# Patient Record
Sex: Male | Born: 1945 | Race: White | Hispanic: No | Marital: Married | Smoking: Former smoker
Health system: Southern US, Community
[De-identification: ages and names within clinical notes are randomized; demographics above are authoritative.]

## PROBLEM LIST (undated history)

## (undated) DIAGNOSIS — F329 Major depressive disorder, single episode, unspecified: Secondary | ICD-10-CM

## (undated) DIAGNOSIS — K922 Gastrointestinal hemorrhage, unspecified: Secondary | ICD-10-CM

## (undated) DIAGNOSIS — H18519 Endothelial corneal dystrophy, unspecified eye: Secondary | ICD-10-CM

## (undated) DIAGNOSIS — I340 Nonrheumatic mitral (valve) insufficiency: Secondary | ICD-10-CM

## (undated) DIAGNOSIS — I639 Cerebral infarction, unspecified: Secondary | ICD-10-CM

## (undated) DIAGNOSIS — R7301 Impaired fasting glucose: Secondary | ICD-10-CM

## (undated) DIAGNOSIS — G43719 Chronic migraine without aura, intractable, without status migrainosus: Secondary | ICD-10-CM

## (undated) DIAGNOSIS — D126 Benign neoplasm of colon, unspecified: Secondary | ICD-10-CM

## (undated) DIAGNOSIS — H1851 Endothelial corneal dystrophy: Secondary | ICD-10-CM

## (undated) DIAGNOSIS — M47812 Spondylosis without myelopathy or radiculopathy, cervical region: Secondary | ICD-10-CM

## (undated) DIAGNOSIS — I517 Cardiomegaly: Secondary | ICD-10-CM

## (undated) DIAGNOSIS — F419 Anxiety disorder, unspecified: Secondary | ICD-10-CM

## (undated) DIAGNOSIS — G894 Chronic pain syndrome: Secondary | ICD-10-CM

## (undated) DIAGNOSIS — Z87442 Personal history of urinary calculi: Secondary | ICD-10-CM

## (undated) DIAGNOSIS — M5030 Other cervical disc degeneration, unspecified cervical region: Secondary | ICD-10-CM

## (undated) DIAGNOSIS — R351 Nocturia: Secondary | ICD-10-CM

## (undated) DIAGNOSIS — F32A Depression, unspecified: Secondary | ICD-10-CM

## (undated) DIAGNOSIS — M542 Cervicalgia: Secondary | ICD-10-CM

## (undated) DIAGNOSIS — H269 Unspecified cataract: Secondary | ICD-10-CM

## (undated) DIAGNOSIS — G3184 Mild cognitive impairment, so stated: Secondary | ICD-10-CM

## (undated) DIAGNOSIS — M792 Neuralgia and neuritis, unspecified: Secondary | ICD-10-CM

## (undated) DIAGNOSIS — T753XXA Motion sickness, initial encounter: Secondary | ICD-10-CM

## (undated) DIAGNOSIS — I10 Essential (primary) hypertension: Secondary | ICD-10-CM

## (undated) DIAGNOSIS — F1020 Alcohol dependence, uncomplicated: Secondary | ICD-10-CM

## (undated) DIAGNOSIS — M5481 Occipital neuralgia: Secondary | ICD-10-CM

## (undated) DIAGNOSIS — E7800 Pure hypercholesterolemia, unspecified: Secondary | ICD-10-CM

## (undated) DIAGNOSIS — I071 Rheumatic tricuspid insufficiency: Secondary | ICD-10-CM

## (undated) DIAGNOSIS — M5410 Radiculopathy, site unspecified: Secondary | ICD-10-CM

## (undated) DIAGNOSIS — M1990 Unspecified osteoarthritis, unspecified site: Secondary | ICD-10-CM

## (undated) HISTORY — DX: Essential (primary) hypertension: I10

## (undated) HISTORY — DX: Impaired fasting glucose: R73.01

## (undated) HISTORY — PX: EYE SURGERY: SHX253

## (undated) HISTORY — DX: Unspecified osteoarthritis, unspecified site: M19.90

## (undated) HISTORY — DX: Alcohol dependence, uncomplicated: F10.20

---

## 2004-07-06 ENCOUNTER — Ambulatory Visit: Payer: Self-pay | Admitting: Internal Medicine

## 2004-11-06 ENCOUNTER — Ambulatory Visit: Payer: Self-pay | Admitting: Internal Medicine

## 2004-11-22 ENCOUNTER — Ambulatory Visit: Payer: Self-pay | Admitting: Internal Medicine

## 2004-12-07 ENCOUNTER — Ambulatory Visit: Payer: Self-pay

## 2004-12-12 ENCOUNTER — Ambulatory Visit: Payer: Self-pay | Admitting: Internal Medicine

## 2005-01-21 ENCOUNTER — Ambulatory Visit: Payer: Self-pay | Admitting: Internal Medicine

## 2005-01-24 ENCOUNTER — Ambulatory Visit: Payer: Self-pay | Admitting: Internal Medicine

## 2005-03-07 ENCOUNTER — Ambulatory Visit: Payer: Self-pay | Admitting: Internal Medicine

## 2005-06-21 ENCOUNTER — Ambulatory Visit: Payer: Self-pay | Admitting: Internal Medicine

## 2005-06-21 ENCOUNTER — Encounter: Payer: Self-pay | Admitting: Internal Medicine

## 2006-01-14 ENCOUNTER — Ambulatory Visit: Payer: Self-pay | Admitting: Internal Medicine

## 2006-01-20 ENCOUNTER — Ambulatory Visit: Payer: Self-pay | Admitting: Internal Medicine

## 2006-10-20 ENCOUNTER — Ambulatory Visit: Payer: Self-pay | Admitting: Internal Medicine

## 2006-10-21 ENCOUNTER — Encounter: Payer: Self-pay | Admitting: Internal Medicine

## 2006-11-01 HISTORY — PX: CYSTOGRAM: SHX1420

## 2006-11-05 ENCOUNTER — Ambulatory Visit: Payer: Self-pay | Admitting: Specialist

## 2006-11-05 ENCOUNTER — Encounter: Payer: Self-pay | Admitting: Internal Medicine

## 2007-01-02 DIAGNOSIS — I10 Essential (primary) hypertension: Secondary | ICD-10-CM

## 2007-01-02 DIAGNOSIS — M109 Gout, unspecified: Secondary | ICD-10-CM

## 2007-01-15 ENCOUNTER — Ambulatory Visit: Payer: Self-pay | Admitting: Internal Medicine

## 2007-01-15 LAB — CONVERTED CEMR LAB
Albumin: 3.9 g/dL (ref 3.5–5.2)
Calcium: 9.3 mg/dL (ref 8.4–10.5)
Chloride: 105 meq/L (ref 96–112)
Direct LDL: 126.4 mg/dL
GFR calc Af Amer: 98 mL/min
GFR calc non Af Amer: 81 mL/min
Glucose, Bld: 146 mg/dL — ABNORMAL HIGH (ref 70–99)
Potassium: 3.9 meq/L (ref 3.5–5.1)
Sodium: 143 meq/L (ref 135–145)
Total CHOL/HDL Ratio: 5.5
VLDL: 61 mg/dL — ABNORMAL HIGH (ref 0–40)

## 2007-11-04 ENCOUNTER — Ambulatory Visit: Payer: Self-pay | Admitting: Internal Medicine

## 2007-11-04 DIAGNOSIS — R7301 Impaired fasting glucose: Secondary | ICD-10-CM | POA: Insufficient documentation

## 2007-11-05 LAB — CONVERTED CEMR LAB: Hgb A1c MFr Bld: 5.6 % (ref 4.6–6.0)

## 2008-12-08 ENCOUNTER — Telehealth: Payer: Self-pay | Admitting: Internal Medicine

## 2009-01-10 ENCOUNTER — Ambulatory Visit: Payer: Self-pay | Admitting: Internal Medicine

## 2009-01-11 LAB — CONVERTED CEMR LAB
Albumin: 3.6 g/dL (ref 3.5–5.2)
Alkaline Phosphatase: 52 units/L (ref 39–117)
Basophils Absolute: 0 10*3/uL (ref 0.0–0.1)
Basophils Relative: 0.8 % (ref 0.0–3.0)
CO2: 29 meq/L (ref 19–32)
Calcium: 8.9 mg/dL (ref 8.4–10.5)
Eosinophils Absolute: 0.1 10*3/uL (ref 0.0–0.7)
Glucose, Bld: 118 mg/dL — ABNORMAL HIGH (ref 70–99)
HCT: 41.4 % (ref 39.0–52.0)
Hemoglobin: 14.7 g/dL (ref 13.0–17.0)
Lymphs Abs: 0.8 10*3/uL (ref 0.7–4.0)
MCHC: 35.4 g/dL (ref 30.0–36.0)
MCV: 94.3 fL (ref 78.0–100.0)
Monocytes Absolute: 0.3 10*3/uL (ref 0.1–1.0)
Neutro Abs: 1.8 10*3/uL (ref 1.4–7.7)
Potassium: 3.4 meq/L — ABNORMAL LOW (ref 3.5–5.1)
RBC: 4.39 M/uL (ref 4.22–5.81)
RDW: 18.1 % — ABNORMAL HIGH (ref 11.5–14.6)
Sodium: 145 meq/L (ref 135–145)
TSH: 0.66 microintl units/mL (ref 0.35–5.50)
Total Protein: 7.3 g/dL (ref 6.0–8.3)

## 2009-04-24 ENCOUNTER — Ambulatory Visit: Payer: Self-pay | Admitting: Family Medicine

## 2009-04-24 DIAGNOSIS — F10239 Alcohol dependence with withdrawal, unspecified: Secondary | ICD-10-CM

## 2009-04-24 LAB — CONVERTED CEMR LAB
AST: 50 units/L — ABNORMAL HIGH (ref 0–37)
Albumin: 3.9 g/dL (ref 3.5–5.2)
Alkaline Phosphatase: 58 units/L (ref 39–117)
Bilirubin, Direct: 0.4 mg/dL — ABNORMAL HIGH (ref 0.0–0.3)
Total Bilirubin: 2.4 mg/dL — ABNORMAL HIGH (ref 0.3–1.2)

## 2009-05-15 ENCOUNTER — Ambulatory Visit: Payer: Self-pay | Admitting: Internal Medicine

## 2009-05-15 DIAGNOSIS — F1010 Alcohol abuse, uncomplicated: Secondary | ICD-10-CM | POA: Insufficient documentation

## 2009-06-15 ENCOUNTER — Ambulatory Visit: Payer: Self-pay | Admitting: Internal Medicine

## 2009-06-15 DIAGNOSIS — N419 Inflammatory disease of prostate, unspecified: Secondary | ICD-10-CM | POA: Insufficient documentation

## 2009-06-15 LAB — CONVERTED CEMR LAB
Glucose, Urine, Semiquant: NEGATIVE
Ketones, urine, test strip: NEGATIVE
Nitrite: NEGATIVE
Protein, U semiquant: 30
RBC / HPF: 0
Specific Gravity, Urine: 1.015

## 2009-07-13 ENCOUNTER — Ambulatory Visit: Payer: Self-pay | Admitting: Internal Medicine

## 2009-07-13 DIAGNOSIS — R079 Chest pain, unspecified: Secondary | ICD-10-CM | POA: Insufficient documentation

## 2009-07-17 ENCOUNTER — Ambulatory Visit: Payer: Self-pay | Admitting: Internal Medicine

## 2009-07-17 ENCOUNTER — Telehealth (INDEPENDENT_AMBULATORY_CARE_PROVIDER_SITE_OTHER): Payer: Self-pay | Admitting: *Deleted

## 2009-07-17 DIAGNOSIS — J984 Other disorders of lung: Secondary | ICD-10-CM

## 2009-07-31 ENCOUNTER — Ambulatory Visit: Payer: Self-pay | Admitting: Internal Medicine

## 2009-08-29 ENCOUNTER — Ambulatory Visit: Payer: Self-pay | Admitting: Internal Medicine

## 2009-10-12 ENCOUNTER — Ambulatory Visit: Payer: Self-pay | Admitting: Internal Medicine

## 2009-10-16 ENCOUNTER — Telehealth: Payer: Self-pay | Admitting: Internal Medicine

## 2009-10-23 ENCOUNTER — Telehealth: Payer: Self-pay | Admitting: Internal Medicine

## 2010-01-03 ENCOUNTER — Ambulatory Visit: Payer: Self-pay | Admitting: Internal Medicine

## 2010-01-03 DIAGNOSIS — S139XXA Sprain of joints and ligaments of unspecified parts of neck, initial encounter: Secondary | ICD-10-CM

## 2010-01-12 ENCOUNTER — Ambulatory Visit: Payer: Self-pay | Admitting: Internal Medicine

## 2010-01-12 DIAGNOSIS — L255 Unspecified contact dermatitis due to plants, except food: Secondary | ICD-10-CM

## 2010-01-25 ENCOUNTER — Ambulatory Visit: Payer: Self-pay | Admitting: Internal Medicine

## 2010-01-25 LAB — CONVERTED CEMR LAB
Blood in Urine, dipstick: NEGATIVE
Nitrite: NEGATIVE
Protein, U semiquant: NEGATIVE
Specific Gravity, Urine: <1.005
WBC Urine, dipstick: NEGATIVE

## 2010-02-08 ENCOUNTER — Telehealth: Payer: Self-pay | Admitting: Internal Medicine

## 2010-04-28 ENCOUNTER — Inpatient Hospital Stay: Payer: Self-pay | Admitting: Internal Medicine

## 2010-04-30 ENCOUNTER — Encounter: Payer: Self-pay | Admitting: Internal Medicine

## 2010-05-01 ENCOUNTER — Encounter: Payer: Self-pay | Admitting: Internal Medicine

## 2010-05-03 ENCOUNTER — Ambulatory Visit: Payer: Self-pay | Admitting: Family Medicine

## 2010-05-03 DIAGNOSIS — L02519 Cutaneous abscess of unspecified hand: Secondary | ICD-10-CM

## 2010-05-03 DIAGNOSIS — L03119 Cellulitis of unspecified part of limb: Secondary | ICD-10-CM

## 2010-05-03 LAB — CONVERTED CEMR LAB: HDL goal, serum: 40 mg/dL

## 2010-05-05 ENCOUNTER — Emergency Department: Payer: Self-pay | Admitting: Emergency Medicine

## 2010-05-17 ENCOUNTER — Telehealth: Payer: Self-pay | Admitting: Internal Medicine

## 2010-06-15 ENCOUNTER — Ambulatory Visit: Payer: Self-pay | Admitting: Internal Medicine

## 2010-06-15 DIAGNOSIS — IMO0002 Reserved for concepts with insufficient information to code with codable children: Secondary | ICD-10-CM

## 2010-06-15 DIAGNOSIS — F438 Other reactions to severe stress: Secondary | ICD-10-CM

## 2010-06-18 LAB — CONVERTED CEMR LAB
ALT: 19 units/L (ref 0–53)
AST: 19 units/L (ref 0–37)
Basophils Relative: 0.4 % (ref 0.0–3.0)
CO2: 27 meq/L (ref 19–32)
Calcium: 9.6 mg/dL (ref 8.4–10.5)
Chloride: 104 meq/L (ref 96–112)
Eosinophils Relative: 1.9 % (ref 0.0–5.0)
GFR calc non Af Amer: 80.95 mL/min (ref >60)
HCT: 37.4 % — ABNORMAL LOW (ref 39.0–52.0)
Hemoglobin: 12.9 g/dL — ABNORMAL LOW (ref 13.0–17.0)
Lymphs Abs: 0.9 10*3/uL (ref 0.7–4.0)
Monocytes Relative: 7 % (ref 3.0–12.0)
Neutro Abs: 4.1 10*3/uL (ref 1.4–7.7)
PSA: 1.67 ng/mL (ref 0.10–4.00)
Sodium: 139 meq/L (ref 135–145)
TSH: 0.35 microintl units/mL (ref 0.35–5.50)
Total Bilirubin: 0.4 mg/dL (ref 0.3–1.2)
Total Protein: 7 g/dL (ref 6.0–8.3)
WBC: 5.6 10*3/uL (ref 4.5–10.5)

## 2010-08-07 ENCOUNTER — Telehealth: Payer: Self-pay | Admitting: Internal Medicine

## 2010-08-14 ENCOUNTER — Encounter (INDEPENDENT_AMBULATORY_CARE_PROVIDER_SITE_OTHER): Payer: Self-pay | Admitting: *Deleted

## 2010-08-16 ENCOUNTER — Ambulatory Visit: Payer: Self-pay | Admitting: Internal Medicine

## 2010-08-20 ENCOUNTER — Encounter: Payer: Self-pay | Admitting: Internal Medicine
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
## 2010-10-03 NOTE — Letter (Signed)
Summary: [facility]   Imported By: Lanelle Bal 05/11/2010 14:05:46  _____________________________________________________________________  External Attachment:    Type:   Image     Comment:   External Document  Appended Document: [facility] GI bleed from peptic ulcer

## 2010-10-03 NOTE — Progress Notes (Signed)
Summary: still not much better  Phone Note Call from Patient Call back at Home Phone [phone]   Caller: Patient Call For: Cindee Salt MD Summary of Call: Pt was seen on 2/10 for URI,  states he continues to have pain in his chest when he coughs.  No shortness of breath or fever.  He thinks he needs more antibiotic.  [facility]  He has appt to see you on friday. Initial call taken by: Lowella Petties CMA,  October 23, 2009 4:09 PM  Follow-up for Phone Call        okay to send Rx for augmentin 875mg  two times a day  #14 x 0 will reeval on Firday. Have him call if worsening  Follow-up by: Cindee Salt MD,  October 23, 2009 4:52 PM  Additional Follow-up for Phone Call Additional follow up Details #1::        Rx Called In, Spoke with patient and advised results.  Additional Follow-up by: Mervin Hack CMA Duncan Dull),  October 23, 2009 5:03 PM    New/Updated Medications: AUGMENTIN 875-125 MG TABS (AMOXICILLIN-POT CLAVULANATE) take 1 by mouth two times a day Prescriptions: AUGMENTIN 875-125 MG TABS (AMOXICILLIN-POT CLAVULANATE) take 1 by mouth two times a day  #14 x 0   Entered by:   Mervin Hack CMA (AAMA)   Authorized by:   Cindee Salt MD   Signed by:   Mervin Hack CMA (AAMA) on 10/23/2009   Method used:   [facility]        [id number]       [address]       [place], [place]  [address]       Ph: [phone]       Fax: [phone]   RxID:   [id number]

## 2010-10-03 NOTE — Assessment & Plan Note (Signed)
Summary: ? INFECTED HAND   Vital Signs:  Patient profile:   65 year old male Height:      67 inches Weight:      214 pounds BMI:     33.64 Temp:     98.1 degrees F oral Pulse rate:   72 / minute Pulse rhythm:   regular BP sitting:   168 / 96  (right arm) Cuff size:   large  Vitals Entered By: Delilah Shan CMA Duncan Dull) (May 03, 2010 10:55 AM) CC: ? infected hand, Lipid Management   History of Present Illness: At [facility] with EGD done showing gastric and duodenal ulcer.  Had IV on dorsum of L hand.   Some pus drained from IV site per patient.  Then area was covered.  Now with pain/soreness in wrist.  L dorsum of hand is puffy.  No FCNAV.  Area is tender to palpation.    Allergies: 1)  ! Altace (Ramipril) 2)  Hydrochlorothiazide (Hydrochlorothiazide)  Past History:  Past Medical History: Gout Hypertension Impaired fasting glucose Alcoholism, sober 2011  EGD done showing gastric and duodenal ulcer 2011  Review of Systems       See HPI.  Otherwise negative.    Physical Exam  General:  NAD RRR CTAB L hand with edema and erythema on dorsum near wrist.  able to make fist, distally nv intact.  edematous area is tender to palpation.  forearm and palm not tender to palpation.    Impression & Recommendations:  Problem # 1:  CELLULITIS, HAND, LEFT (ICD-682.4) Start doxy with routine precautions and proceed to ER if symptoms progress.  He agrees.  No indication of compartment syndrome now.  The following medications were removed from the medication list:    Ciprofloxacin Hcl 250 Mg Tabs (Ciprofloxacin hcl) .Marland Kitchen... 1 tab by mouth two times a day for prostate infection His updated medication list for this problem includes:    Doxycycline Hyclate 100 Mg Tabs (Doxycycline hyclate) .Marland Kitchen... 1 by mouth two times a day x10d  Complete Medication List: 1)  Norvasc 10 Mg Tabs (Amlodipine besylate) .... Take 1 tablet by mouth once a day 2)  Atenolol 100 Mg Tabs (Atenolol) .Marland Kitchen.. 1 two  times a day 3)  Cardura 8 Mg Tabs (Doxazosin mesylate) .... Take one by mouth daily 4)  Carafate 1 Gm Tabs (Sucralfate) .... Take 1 tablet by mouth four times a day 5)  Protonix 40 Mg Tbec (Pantoprazole sodium) .... Take 1 tablet by mouth two times a day x 4 weeks 6)  Tylenol Extra Strength 500 Mg Tabs (Acetaminophen) .... Take 1 tablet by mouth three times a day as needed 7)  Ultram 50 Mg Tabs (Tramadol hcl) .... Take 1 tablet by mouth two times a day as needed 8)  Flexeril 5 Mg Tabs (Cyclobenzaprine hcl) .... Take 1 tablet by mouth three times a day as needed 9)  Doxycycline Hyclate 100 Mg Tabs (Doxycycline hyclate) .Marland Kitchen.. 1 by mouth two times a day x10d   Patient Instructions: 1)  If your hand gets bigger, redder, or more painful, then go to the ER.  Take the antibiotics twice a day.   Prescriptions: DOXYCYCLINE HYCLATE 100 MG TABS (DOXYCYCLINE HYCLATE) 1 by mouth two times a day x10d  #20 x 0   Entered and Authorized by:   Crawford Givens MD   Signed by:   Crawford Givens MD on 05/03/2010   Method used:   [facility]  [address] [id number]       [address]       [place], [place]  [address]       Ph: [phone]       Fax: [phone]   RxID:   [id number]   Current Allergies (reviewed today): ! ALTACE (RAMIPRIL) HYDROCHLOROTHIAZIDE (HYDROCHLOROTHIAZIDE)

## 2010-10-03 NOTE — Assessment & Plan Note (Signed)
Summary: CONGESTION/CLE   Vital Signs:  Patient profile:   65 year old male Weight:      217 pounds Temp:     98 degrees F oral Resp:     14 per minute BP sitting:   150 / 90  (left arm) Cuff size:   large  Vitals Entered By: Mervin Hack CMA Duncan Dull) (October 12, 2009 5:04 PM) CC: CONGESTION   History of Present Illness: Has had a bit of cough that goes back 4-5 months Now it has settled into his chest Sore "inside" in upper chest and back--he can feel the pressure with cough Has to splint upper chest when coughing worsening over the past month Cough has some mucus--slightly thick  No fever No sig SOB No sweats at night  some blood clots from nose--trying vick's vaporub and antibiotic cream. some better    Allergies: 1)  ! Altace (Ramipril) 2)  Hydrochlorothiazide (Hydrochlorothiazide)  Past History:  Past medical, surgical, family and social histories (including risk factors) reviewed for relevance to current acute and chronic problems.  Past Medical History: Reviewed history from 05/15/2009 and no changes required. Gout Hypertension Impaired fasting glucose Alcoholism  Past Surgical History: Reviewed history from 01/02/2007 and no changes required. Cysto--- normal  3/08  Family History: Reviewed history from 01/15/2007 and no changes required. Mom (Faye)--fibromyalgia Dad alive 1 sister 2 brothers Distant history of CVA on Mom's side No prostate or colon cancer No DM  Social History: Reviewed history from 05/15/2009 and no changes required. 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  NAD Head:  no sinus tenderness Ears:  R ear normal and L ear normal.   Nose:  mild congestion Mouth:  no erythema and no exudates.   Neck:  supple, no  masses, and no cervical lymphadenopathy.   Lungs:  normal respiratory effort, no intercostal retractions, no accessory muscle use, and normal breath sounds.     Impression & Recommendations:  Problem # 1:  BRONCHITIS- ACUTE (ICD-466.0) Assessment New  persistent for some time could be atypical bacterial though not clear cut analgesics will try azithromycin  His updated medication list for this problem includes:    Azithromycin 250 Mg Tabs (Azithromycin) .Marland Kitchen... 2 tabs today, then 1 tab daily for bronchitis  Complete Medication List: 1)  Norvasc 10 Mg Tabs (Amlodipine besylate) .... Take 1 tablet by mouth once a day 2)  Atenolol 100 Mg Tabs (Atenolol) .Marland Kitchen.. 1 two times a day 3)  Cardura 8 Mg Tabs (Doxazosin mesylate) .... Take one by mouth daily 4)  Azithromycin 250 Mg Tabs (Azithromycin) .... 2 tabs today, then 1 tab daily for bronchitis  Patient Instructions: 1)  Please keep appt for May 4th Prescriptions: AZITHROMYCIN 250 MG TABS (AZITHROMYCIN) 2 tabs today, then 1 tab daily for bronchitis  #6 x 0   Entered and Authorized by:   Cindee Salt MD   Signed by:   Cindee Salt MD on 10/12/2009   Method used:   [facility]        [id number]       [address]       [place], [place]  [address]       Ph: [phone]       Fax:  [phone]   RxID:   [id number]   Current Allergies (reviewed today): ! ALTACE (RAMIPRIL) HYDROCHLOROTHIAZIDE (HYDROCHLOROTHIAZIDE)

## 2010-10-03 NOTE — Progress Notes (Signed)
Summary: Appointments and refills  Phone Note Outgoing Call   Call placed by: Mervin Hack CMA,  December 08, 2008 9:38 AM Call placed to: Patient Summary of Call: spoke with patient about cancelling his appts and then wanting refills. Per Dr. Alphonsus Sias no more refills until patient schedules and appt, if patient cancels there will be no more refills. Patient said he understood. Initial call taken by: Mervin Hack CMA,  December 08, 2008 9:39 AM

## 2010-10-03 NOTE — Progress Notes (Signed)
Summary: should pt continue cipro?  Phone Note Call from Patient Call back at Home Phone [phone]   Caller: Patient Call For: Cindee Salt MD Summary of Call: Pt was seen on 5/26 for a prostate infection.  He has 9 days left of cipro and he is asking if he should continue this for a while longer.  You had mentioned to him that he might need to stay on it for 3 months or so.   He still has some bladder discomfort.  [facility]. Initial call taken by: Lowella Petties CMA,  February 08, 2010 10:27 AM  Follow-up for Phone Call        He should take the full 3 week course that I prescribed I sent Rx for 42 which is 3 weeks worth Follow-up by: Cindee Salt MD,  February 08, 2010 12:44 PM  Additional Follow-up for Phone Call Additional follow up Details #1::        Spoke with patient and advised results.  Additional Follow-up by: Mervin Hack CMA Duncan Dull),  February 08, 2010 3:58 PM

## 2010-10-03 NOTE — Progress Notes (Signed)
Summary: call from patient  Phone Note Call from Patient Call back at Home Phone [phone]   Caller: Patient Call For: Cindee Salt MD Summary of Call: Patient says that he is doing fine and at this time he does not feel that he needs a follow up visit. He says that he is going to keep his cpx appt. which is in october. He also says thank you for the call and for being concerned for him.  Initial call taken by: Melody Comas,  May 17, 2010 1:42 PM  Follow-up for Phone Call        noted Follow-up by: Cindee Salt MD,  May 17, 2010 1:52 PM

## 2010-10-03 NOTE — Procedures (Signed)
Summary: Upper GI Endoscopy by Dr.Robert [facility]  Upper GI Endoscopy by Dr.Robert [facility]   Imported By: Beau Fanny 05/03/2010 16:00:40  _____________________________________________________________________  External Attachment:    Type:   Image     Comment:   External Document  Appended Document: Upper GI Endoscopy by Dr.Robert [facility] gastric and duodenal ulcers erosive gastropathy

## 2010-10-03 NOTE — Assessment & Plan Note (Signed)
Summary: CPX/DLO   Vital Signs:  Patient profile:   65 year old male Weight:      212 pounds Temp:     97.5 degrees F oral Pulse rate:   60 / minute Pulse rhythm:   regular BP sitting:   158 / 98  (left arm) Cuff size:   large  Vitals Entered By: Mervin Hack CMA Duncan Dull) (June 15, 2010 10:02 AM) CC: adult physical   History of Present Illness: No doing well Still abstinent Goes to AA several times a week generally  Ongoing neck pain from vertebral abnormality has been using excedrin---may have lead to UGI bleed Now just using tylenol Pain radiates up and around neck and goes to eyes Did have negative CT in ER at [facility] 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 Altace (Ramipril) 2)  Hydrochlorothiazide (Hydrochlorothiazide)  Past History:  Past medical, surgical, family and social histories (including risk factors) reviewed for relevance to current acute and chronic problems.  Past Medical History: Gout Hypertension Impaired fasting glucose Alcoholism, sober 2011  EGD done showing gastric and duodenal ulcer 2011 Osteoarthritis--neck  Past Surgical History: Reviewed history from 01/02/2007 and no changes required. Cysto--- normal  3/08  Family History: Reviewed history from 01/15/2007 and no changes required. Mom (Faye)--fibromyalgia Dad alive 1 sister 2 brothers Distant history of CVA on Mom's side No prostate or colon cancer No DM  Social History: Reviewed history from 05/15/2009 and no changes required. 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 Eyes:  Denies double vision and vision loss-1 eye. ENT:  Complains of ringing in ears; denies decreased hearing; teeth okay--regular with dentist. CV:  Denies chest pain or discomfort, difficulty breathing at night, difficulty breathing while lying down, fainting, lightheadness, palpitations, and shortness of breath with exertion. Resp:  Denies cough and shortness of breath. GI:  Denies bloody stools, change in bowel habits, dark tarry stools, indigestion, nausea, and vomiting. GU:  Denies dysuria, erectile dysfunction, urinary frequency, and urinary hesitancy. MS:  Complains of joint pain; neck pain and head---only on left. Derm:  Denies lesion(s) and rash. Neuro:  Denies headaches, numbness, tingling, and weakness. Psych:  Denies anxiety and depression; Marriage is troubled--probably goes back to all his time as alcoholic. Heme:  Denies abnormal bruising and enlarge lymph nodes. Allergy:  Denies seasonal allergies and sneezing.  Physical Exam  General:  alert and normal appearance.   Eyes:  pupils equal, pupils round, pupils reactive to light, and no optic disk abnormalities.   Ears:  R ear normal and L ear normal.   Mouth:  no erythema, no exudates, and no lesions.   Neck:  supple, no masses, no thyromegaly, no carotid bruits, and no cervical lymphadenopathy.   Lungs:  normal respiratory effort, no intercostal retractions, no accessory muscle use, and normal breath sounds.   Heart:  normal rate, regular rhythm, and no gallop.   ?slight systolic murmur Abdomen:  soft, non-tender, and no masses.   Rectal:  no hemorrhoids and no masses.  Prostate:  no gland enlargement and no nodules.   Msk:  mild thickening in left wrist and over left olecranon without inflammation Pulses:  faint in feet Extremities:  no sig edema Neurologic:  alert & oriented X3, strength normal in all extremities, and gait  normal.   Skin:  no rashes and no suspicious lesions.   Axillary Nodes:  No palpable lymphadenopathy Psych:  normally interactive, good eye contact, not anxious appearing, and not depressed appearing.     Impression & Recommendations:  Problem # 1:  HEALTH MAINTENANCE EXAM (ICD-V70.0) Assessment Comment Only due for PSA--discussed and will do will do stool immunoassay  Problem # 2:  OSTEOARTHRITIS (ICD-715.90) Assessment: Deteriorated mostly in neck will have him continue the tylenol discussed local care could consider methocarbamol  The following medications were removed from the medication list:    Ultram 50 Mg Tabs (Tramadol hcl) .Marland Kitchen... Take 1 tablet by mouth two times a day as needed His updated medication list for this problem includes:    Tylenol Extra Strength 500 Mg Tabs (Acetaminophen) .Marland Kitchen... Take 1 tablet by mouth three times a day as needed  Problem # 3:  ANXIETY, SITUATIONAL (ICD-308.3) Assessment: New  obviously has sig marital issues that are bothering him now will set up appt with Dr Laymond Purser  Orders: Psychology Referral (Psychology)  Problem # 4:  HYPERTENSION (ICD-401.9) Assessment: Unchanged  still suboptimal but he is resistant to more meds and I am reluctant will just monitor  The following medications were removed from the medication list:    Norvasc 10 Mg Tabs (Amlodipine besylate) .Marland Kitchen... Take 1 tablet by mouth once a day His updated medication list for this problem includes:    Atenolol 100 Mg Tabs (Atenolol) .Marland Kitchen... 1 two times a day    Cardura 8 Mg Tabs (Doxazosin mesylate) .Marland Kitchen... Take one by mouth daily  BP today: 158/98 Prior BP: 168/96 (05/03/2010)  Prior 10 Yr Risk Heart Disease: Not enough information (05/03/2010)  Labs Reviewed: K+: 3.4 (01/10/2009) Creat: : 1.1 (01/10/2009)   Chol: 211 (01/15/2007)   HDL: 38.3 (01/15/2007)   LDL: DEL (01/15/2007)   TG: 303 (01/15/2007)  Orders: TLB-Renal Function Panel (80069-RENAL) TLB-CBC Platelet -  w/Differential (85025-CBCD) TLB-Hepatic/Liver Function Pnl (80076-HEPATIC) TLB-TSH (Thyroid Stimulating Hormone) (84443-TSH) Venipuncture (21308)  Problem # 5:  GOUT (ICD-274.9) Assessment: Unchanged quiet off HCTZ  Complete Medication List: 1)  Atenolol 100 Mg Tabs (Atenolol) .Marland Kitchen.. 1 two times a day 2)  Cardura 8 Mg Tabs (Doxazosin mesylate) .... Take one by mouth daily 3)  Tylenol Extra Strength 500 Mg Tabs (Acetaminophen) .... Take 1 tablet by mouth three times a day as needed  Other Orders: TLB-PSA (Prostate Specific Antigen) (84153-PSA)  Patient Instructions: 1)  Please schedule a follow-up appointment in 6 months .  2)  Complete your hemoccult cards and return them soon.   Current Allergies (reviewed today): ! ALTACE (RAMIPRIL) HYDROCHLOROTHIAZIDE (HYDROCHLOROTHIAZIDE)

## 2010-10-03 NOTE — Assessment & Plan Note (Signed)
Summary: CHECK PLACES ON LEGS/CLE   Vital Signs:  Patient profile:   65 year old male Weight:      217 pounds Temp:     97.7 degrees F oral BP sitting:   140 / 90  (left arm) Cuff size:   large  Vitals Entered By: Mervin Hack CMA Duncan Dull) (Jan 12, 2010 8:26 AM) CC: rash on legs   History of Present Illness: Has had some intermittent red bumps on legs will come and go  now in past few days, has noted increased red bumps that are persistent these are different Not on feet or groin some on arms as well  no pain  no itching  Has been out doing some work in yard---pulling ivy by bushes  Allergies: 1)  ! Altace (Ramipril) 2)  Hydrochlorothiazide (Hydrochlorothiazide)  Past History:  Past medical, surgical, family and social histories (including risk factors) reviewed for relevance to current acute and chronic problems.  Past Medical History: Reviewed history from 05/15/2009 and no changes required. Gout Hypertension Impaired fasting glucose Alcoholism  Past Surgical History: Reviewed history from 01/02/2007 and no changes required. Cysto--- normal  3/08  Family History: Reviewed history from 01/15/2007 and no changes required. Mom (Faye)--fibromyalgia Dad alive 1 sister 2 brothers Distant history of CVA on Mom's side No prostate or colon cancer No DM  Social History: Reviewed history from 05/15/2009 and no changes required. 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 Take 1 tablet by mouth once a day 2)  Atenolol 100 Mg Tabs (Atenolol) .Marland Kitchen.. 1 two times a day 3)  Cardura 8 Mg Tabs (Doxazosin mesylate) .... Take one by mouth daily  Patient Instructions: 1)  Keep regular follow up appt  Current Allergies (reviewed today): ! ALTACE (RAMIPRIL) HYDROCHLOROTHIAZIDE (HYDROCHLOROTHIAZIDE)

## 2010-10-03 NOTE — Progress Notes (Signed)
Summary: not much better  Phone Note Call from Patient Call back at [phone]   Caller: Patient Call For: Cindee Salt MD Summary of Call: Pt was in last week and was given a z-pack for bronchitis.  He says he doesnt feel much better and I advised him to give it more time.  He is asking for something for his cough to be called to [facility]. Initial call taken by: Lowella Petties CMA,  October 16, 2009 11:13 AM  Follow-up for Phone Call        okay to send Rx for hydrocodone-homotropine syrup 4oz 1-2 teaspoons at bedtime as needed severe cough Should use honey or OTC meds during daytime Follow-up by: Cindee Salt MD,  October 16, 2009 11:40 AM  Additional Follow-up for Phone Call Additional follow up Details #1::        Rx Called In, left message on patient voicemail with results. Additional Follow-up by: Mervin Hack CMA Duncan Dull),  October 16, 2009 12:45 PM    New/Updated Medications: HYDROCODONE-HOMATROPINE 5-1.5 MG/5ML SYRP (HYDROCODONE-HOMATROPINE) 1-2 teaspoons at bedtime as needed severe cough Prescriptions: HYDROCODONE-HOMATROPINE 5-1.5 MG/5ML SYRP (HYDROCODONE-HOMATROPINE) 1-2 teaspoons at bedtime as needed severe cough  #4oz x 0   Entered by:   Mervin Hack CMA (AAMA)   Authorized by:   Cindee Salt MD   Signed by:   Mervin Hack CMA (AAMA) on 10/16/2009   Method used:   Telephoned to ...       [facility]  [address] [id number]       [address]       [place], [place]  [address]       Ph: [phone]       Fax: [phone]   RxID:   [id number]

## 2010-10-03 NOTE — Letter (Signed)
Summary: [facility]: Immunoassay Fecal Occult Blood (iFOB) Order Form  [name] [facility]  [address] [place], [place] [address]   Phone: [phone]  Fax: [phone]      [facility]: Immunoassay Fecal Occult Blood (iFOB) Order Form   June 15, 2010 MRN: [id number]   Carl [name] [birth year]   Physicican Name:_______Letvak__________________  Diagnosis Code:_______V76.51___________________      Cindee Salt MD

## 2010-10-03 NOTE — Assessment & Plan Note (Signed)
Summary: ?UTI/CLE   Vital Signs:  Patient profile:   65 year old male Weight:      216 pounds BMI:     33.95 Temp:     98.4 degrees F oral BP sitting:   150 / 100  (left arm) Cuff size:   large  Vitals Entered By: Mervin Hack CMA Duncan Dull) (Jan 25, 2010 5:15 PM) CC: UTI   History of Present Illness: Has burning dysuria Has suprapubic tenderness as well--gets heavy feeling there when voiding  goes back mildly about 2 weeks but has been worsening  No fever Has had some of these symptoms upon ejacuating no hematospermia    Allergies: 1)  ! Altace (Ramipril) 2)  Hydrochlorothiazide (Hydrochlorothiazide)  Past History:  Past medical, surgical, family and social histories (including risk factors) reviewed for relevance to current acute and chronic problems.  Past Medical History: Reviewed history from 05/15/2009 and no changes required. Gout Hypertension Impaired fasting glucose Alcoholism  Past Surgical History: Reviewed history from 01/02/2007 and no changes required. Cysto--- normal  3/08  Family History: Reviewed history from 01/15/2007 and no changes required. Mom (Faye)--fibromyalgia Dad alive 1 sister 2 brothers Distant history of CVA on Mom's side No prostate or colon cancer No DM  Social History: Reviewed history from 05/15/2009 and no changes required. 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   Abdomen:  soft.  No sig suprapubic tenderness but he is sensitive there Rectal:  no hemorrhoids and no masses.   Genitalia:  no scrotal masses, no testicular masses or atrophy, no cutaneous lesions, and no urethral discharge.   No scrotal tenderness Prostate:  no gland enlargement and no nodules.  slight tenderness   Impression &  Recommendations:  Problem # 1:  UNSPECIFIED PROSTATITIS (ICD-601.9) Assessment New  bladder symptoms also but most likely started in prostate had negative cysto 3 years ago--he can't remember if it was for similar symptoms will treat for 3 weeks  Orders: UA Dipstick w/o Micro (manual) (21308)  Complete Medication List: 1)  Norvasc 10 Mg Tabs (Amlodipine besylate) .... Take 1 tablet by mouth once a day 2)  Atenolol 100 Mg Tabs (Atenolol) .Marland Kitchen.. 1 two times a day 3)  Cardura 8 Mg Tabs (Doxazosin mesylate) .... Take one by mouth daily 4)  Ciprofloxacin Hcl 250 Mg Tabs (Ciprofloxacin hcl) .Marland Kitchen.. 1 tab by mouth two times a day for prostate infection  Patient Instructions: 1)  Please schedule a follow-up appointment as needed .  Prescriptions: CIPROFLOXACIN HCL 250 MG TABS (CIPROFLOXACIN HCL) 1 tab by mouth two times a day for prostate infection  #42 x 0   Entered and Authorized by:   Cindee Salt MD   Signed by:   Cindee Salt MD on 01/25/2010   Method used:   [facility]ically [facility]        [id number]       [address]       [place], [place]  [address]       Ph: [phone]       Fax: [phone]   RxID:   [id number]   Laboratory Results   Urine Tests  Date/Time Received: Jan 25, 2010 5:16 PM Date/Time Reported: Jan 25, 2010 5:16 PM  Routine Urinalysis   Color:  yellow Appearance: Hazy Glucose: negative   (Normal Range: Negative) Bilirubin: negative   (Normal Range: Negative) Ketone: negative   (Normal Range: Negative) Spec. Gravity: <1.005   (Normal Range: 1.003-1.035) Blood: negative   (Normal Range: Negative) pH: 6.0   (Normal Range: 5.0-8.0) Protein: negative   (Normal Range: Negative) Urobilinogen: 0.2   (Normal Range: 0-1) Nitrite: negative   (Normal Range: Negative) Leukocyte Esterace: negative   (Normal Range: Negative)

## 2010-10-04 NOTE — Letter (Signed)
Summary: Generic Letter  [place] [facility]  [address] [place], [place] [address]   Phone: [phone]  Fax: [phone]    08/14/2010     [facility] [address] [place], [place]  [address]      Dear Carl [name],  I have made several attempts to contact you regarding your stool test kit. Please contact me to prevent a charge of $5.27 to your account, this is  to cover the costs of the kit provided to you. My direct line is [phone].       Sincerely,   Mills Koller

## 2010-10-04 NOTE — Letter (Signed)
Summary: Results Follow up Letter  [place] at [facility]  [address] [place], [place] [address]   Phone: [phone]  Fax: [phone]    08/20/2010 MRN: [id number]  MARX DO[facility] [address] [place], [place]  [address]  Dear Mr. Bradham,  The following are the results of your recent test(s):  Test         Result    Pap Smear:        Normal _____  Not Normal _____ Comments: ______________________________________________________ Cholesterol: LDL(Bad cholesterol):         Your goal is less than:         HDL (Good cholesterol):       Your goal is more than: Comments:  ______________________________________________________ Mammogram:        Normal _____  Not Normal _____ Comments:  ___________________________________________________________________ Hemoccult:        Normal __X___  Not normal _______ Comments: stool test doesn't show any blood we will plan to repeat this next year   _____________________________________________________________________ Other Tests:    We routinely do not discuss normal results over the telephone.  If you desire a copy of the results, or you have any questions about this information we can discuss them at your next office visit.   Sincerely,       Tillman Abide, MD

## 2010-10-04 NOTE — Progress Notes (Signed)
  Phone Note From Other Clinic   Caller: Clydie Braun @ [address] Lab Summary of Call: ifob never returned, lmom for patient to call me. Patient called me back, I asked if he would do the ifob and send it in. I'm not sure what he will do. Initial call taken by: Mills Koller,  August 07, 2010 4:34 PM  Follow-up for Phone Call        after several attemps to contact the patient, a letter was sent out regarding ifob testing.      Noted Follow-up by: Cindee Salt MD,  August 14, 2010 12:29 PM

## 2010-10-31 ENCOUNTER — Encounter: Payer: Self-pay | Admitting: Internal Medicine

## 2010-12-05 ENCOUNTER — Emergency Department: Payer: Self-pay | Admitting: Emergency Medicine

## 2010-12-17 ENCOUNTER — Ambulatory Visit: Payer: Self-pay | Admitting: Internal Medicine

## 2010-12-17 DIAGNOSIS — Z0289 Encounter for other administrative examinations: Secondary | ICD-10-CM

## 2011-03-14 DIAGNOSIS — G4700 Insomnia, unspecified: Secondary | ICD-10-CM | POA: Insufficient documentation

## 2011-03-14 DIAGNOSIS — F1021 Alcohol dependence, in remission: Secondary | ICD-10-CM | POA: Insufficient documentation

## 2011-03-14 DIAGNOSIS — M5030 Other cervical disc degeneration, unspecified cervical region: Secondary | ICD-10-CM | POA: Insufficient documentation

## 2011-03-14 DIAGNOSIS — M150 Primary generalized (osteo)arthritis: Secondary | ICD-10-CM | POA: Insufficient documentation

## 2011-09-21 ENCOUNTER — Ambulatory Visit: Payer: Self-pay | Admitting: Neurosurgery

## 2011-09-21 LAB — CREATININE, SERUM
EGFR (African American): >60
EGFR (Non-African Amer.): >60

## 2011-10-08 ENCOUNTER — Emergency Department (HOSPITAL_COMMUNITY): Payer: Medicare Other

## 2011-10-08 ENCOUNTER — Other Ambulatory Visit: Payer: Self-pay

## 2011-10-08 ENCOUNTER — Inpatient Hospital Stay (HOSPITAL_COMMUNITY)
Admission: EM | Admit: 2011-10-08 | Discharge: 2011-10-21 | DRG: 064 | Disposition: A | Discharge status: Skilled Nursing Facility | Payer: Medicare Other | Attending: Neurology | Admitting: Neurology

## 2011-10-08 ENCOUNTER — Encounter (HOSPITAL_COMMUNITY): Payer: Self-pay | Admitting: *Deleted

## 2011-10-08 ENCOUNTER — Inpatient Hospital Stay (HOSPITAL_COMMUNITY): Payer: Medicare Other

## 2011-10-08 DIAGNOSIS — N390 Urinary tract infection, site not specified: Secondary | ICD-10-CM | POA: Diagnosis not present

## 2011-10-08 DIAGNOSIS — F10939 Alcohol use, unspecified with withdrawal, unspecified: Secondary | ICD-10-CM | POA: Diagnosis not present

## 2011-10-08 DIAGNOSIS — Z79899 Other long term (current) drug therapy: Secondary | ICD-10-CM

## 2011-10-08 DIAGNOSIS — F10239 Alcohol dependence with withdrawal, unspecified: Secondary | ICD-10-CM

## 2011-10-08 DIAGNOSIS — R29810 Facial weakness: Secondary | ICD-10-CM | POA: Diagnosis present

## 2011-10-08 DIAGNOSIS — F1020 Alcohol dependence, uncomplicated: Secondary | ICD-10-CM | POA: Diagnosis present

## 2011-10-08 DIAGNOSIS — M1990 Unspecified osteoarthritis, unspecified site: Secondary | ICD-10-CM | POA: Diagnosis present

## 2011-10-08 DIAGNOSIS — E876 Hypokalemia: Secondary | ICD-10-CM | POA: Diagnosis present

## 2011-10-08 DIAGNOSIS — M25569 Pain in unspecified knee: Secondary | ICD-10-CM | POA: Diagnosis present

## 2011-10-08 DIAGNOSIS — I10 Essential (primary) hypertension: Secondary | ICD-10-CM | POA: Diagnosis present

## 2011-10-08 DIAGNOSIS — K449 Diaphragmatic hernia without obstruction or gangrene: Secondary | ICD-10-CM | POA: Diagnosis present

## 2011-10-08 DIAGNOSIS — Z6829 Body mass index (BMI) 29.0-29.9, adult: Secondary | ICD-10-CM

## 2011-10-08 DIAGNOSIS — R4701 Aphasia: Secondary | ICD-10-CM | POA: Diagnosis present

## 2011-10-08 DIAGNOSIS — M25539 Pain in unspecified wrist: Secondary | ICD-10-CM | POA: Diagnosis present

## 2011-10-08 DIAGNOSIS — E669 Obesity, unspecified: Secondary | ICD-10-CM | POA: Diagnosis present

## 2011-10-08 DIAGNOSIS — I619 Nontraumatic intracerebral hemorrhage, unspecified: Secondary | ICD-10-CM

## 2011-10-08 DIAGNOSIS — F10231 Alcohol dependence with withdrawal delirium: Secondary | ICD-10-CM

## 2011-10-08 DIAGNOSIS — M109 Gout, unspecified: Secondary | ICD-10-CM | POA: Diagnosis present

## 2011-10-08 DIAGNOSIS — E785 Hyperlipidemia, unspecified: Secondary | ICD-10-CM | POA: Diagnosis present

## 2011-10-08 DIAGNOSIS — I629 Nontraumatic intracranial hemorrhage, unspecified: Secondary | ICD-10-CM

## 2011-10-08 DIAGNOSIS — K922 Gastrointestinal hemorrhage, unspecified: Secondary | ICD-10-CM

## 2011-10-08 DIAGNOSIS — G8190 Hemiplegia, unspecified affecting unspecified side: Secondary | ICD-10-CM | POA: Diagnosis present

## 2011-10-08 DIAGNOSIS — Z888 Allergy status to other drugs, medicaments and biological substances status: Secondary | ICD-10-CM

## 2011-10-08 DIAGNOSIS — G8929 Other chronic pain: Secondary | ICD-10-CM | POA: Diagnosis present

## 2011-10-08 DIAGNOSIS — K219 Gastro-esophageal reflux disease without esophagitis: Secondary | ICD-10-CM | POA: Diagnosis present

## 2011-10-08 DIAGNOSIS — F1010 Alcohol abuse, uncomplicated: Secondary | ICD-10-CM

## 2011-10-08 DIAGNOSIS — K254 Chronic or unspecified gastric ulcer with hemorrhage: Secondary | ICD-10-CM | POA: Diagnosis present

## 2011-10-08 DIAGNOSIS — R1310 Dysphagia, unspecified: Secondary | ICD-10-CM | POA: Diagnosis present

## 2011-10-08 DIAGNOSIS — G9341 Metabolic encephalopathy: Secondary | ICD-10-CM | POA: Diagnosis not present

## 2011-10-08 DIAGNOSIS — K920 Hematemesis: Secondary | ICD-10-CM

## 2011-10-08 LAB — CBC
HCT: 38.8 % — ABNORMAL LOW (ref 39.0–52.0)
HCT: 40 % (ref 39.0–52.0)
Hemoglobin: 14 g/dL (ref 13.0–17.0)
MCH: 32.9 pg (ref 26.0–34.0)
MCH: 32.9 pg (ref 26.0–34.0)
MCHC: 34.9 g/dL (ref 30.0–36.0)
MCV: 93 fL (ref 78.0–100.0)
MCV: 94.1 fL (ref 78.0–100.0)
MCV: 94.1 fL (ref 78.0–100.0)
Platelets: 145 10*3/uL — ABNORMAL LOW (ref 150–400)
Platelets: 146 10*3/uL — ABNORMAL LOW (ref 150–400)
RBC: 4.25 MIL/uL (ref 4.22–5.81)
RDW: 13.3 % (ref 11.5–15.5)
RDW: 13.6 % (ref 11.5–15.5)
RDW: 13.7 % (ref 11.5–15.5)
WBC: 6.3 10*3/uL (ref 4.0–10.5)

## 2011-10-08 LAB — COMPREHENSIVE METABOLIC PANEL
AST: 19 U/L (ref 0–37)
Albumin: 4.1 g/dL (ref 3.5–5.2)
BUN: 17 mg/dL (ref 6–23)
Calcium: 9.3 mg/dL (ref 8.4–10.5)
Creatinine, Ser: 0.84 mg/dL (ref 0.50–1.35)
GFR calc Af Amer: >90 mL/min (ref >90)

## 2011-10-08 LAB — DIFFERENTIAL
Eosinophils Relative: 0 % (ref 0–5)
Lymphocytes Relative: 6 % — ABNORMAL LOW (ref 12–46)
Lymphs Abs: 0.5 10*3/uL — ABNORMAL LOW (ref 0.7–4.0)
Monocytes Absolute: 0.5 10*3/uL (ref 0.1–1.0)

## 2011-10-08 LAB — POCT I-STAT, CHEM 8
BUN: 18 mg/dL (ref 6–23)
Calcium, Ion: 1.15 mmol/L (ref 1.12–1.32)
Chloride: 104 mEq/L (ref 96–112)

## 2011-10-08 LAB — TROPONIN I: Troponin I: <0.3 ng/mL (ref <0.30)

## 2011-10-08 LAB — RAPID URINE DRUG SCREEN, HOSP PERFORMED
Amphetamines: NOT DETECTED
Benzodiazepines: NOT DETECTED
Opiates: NOT DETECTED

## 2011-10-08 LAB — MRSA PCR SCREENING: MRSA by PCR: NEGATIVE

## 2011-10-08 LAB — SALICYLATE LEVEL: Salicylate Lvl: <2 mg/dL — ABNORMAL LOW (ref 2.8–20.0)

## 2011-10-08 MED ORDER — SODIUM CHLORIDE 0.9 % IV SOLN
25.0000 ug/h | INTRAVENOUS | Status: DC
  Administered 2011-10-08 (×2): 25 ug/h via INTRAVENOUS
  Filled 2011-10-08 (×6): qty 1

## 2011-10-08 MED ORDER — ACETAMINOPHEN 650 MG RE SUPP
650.0000 mg | RECTAL | Status: DC | PRN
  Administered 2011-10-12 – 2011-10-13 (×3): 650 mg via RECTAL
  Filled 2011-10-08 (×3): qty 1

## 2011-10-08 MED ORDER — NICARDIPINE HCL IN NACL 20-0.86 MG/200ML-% IV SOLN
5.0000 mg/h | INTRAVENOUS | Status: DC
  Filled 2011-10-08: qty 200

## 2011-10-08 MED ORDER — PANTOPRAZOLE SODIUM 40 MG IV SOLR
40.0000 mg | Freq: Every day | INTRAVENOUS | Status: DC
  Filled 2011-10-08: qty 40

## 2011-10-08 MED ORDER — LABETALOL HCL 5 MG/ML IV SOLN
10.0000 mg | INTRAVENOUS | Status: DC | PRN
  Administered 2011-10-08 (×3): 40 mg via INTRAVENOUS
  Administered 2011-10-09 – 2011-10-10 (×3): 20 mg via INTRAVENOUS
  Administered 2011-10-13 (×2): 10 mg via INTRAVENOUS
  Administered 2011-10-13: 20 mg via INTRAVENOUS
  Administered 2011-10-13: 10 mg via INTRAVENOUS
  Administered 2011-10-15: 20 mg via INTRAVENOUS
  Administered 2011-10-15: 40 mg via INTRAVENOUS
  Administered 2011-10-15 (×3): 20 mg via INTRAVENOUS
  Filled 2011-10-08 (×7): qty 4
  Filled 2011-10-08: qty 8
  Filled 2011-10-08 (×3): qty 4
  Filled 2011-10-08: qty 8
  Filled 2011-10-08: qty 4
  Filled 2011-10-08: qty 8
  Filled 2011-10-08 (×2): qty 4

## 2011-10-08 MED ORDER — ACETAMINOPHEN 325 MG PO TABS: 650.0000 mg | ORAL_TABLET | Freq: Once | ORAL | Status: DC

## 2011-10-08 MED ORDER — ACETAMINOPHEN 325 MG PO TABS
650.0000 mg | ORAL_TABLET | ORAL | Status: DC | PRN
  Administered 2011-10-10 – 2011-10-17 (×3): 650 mg via ORAL
  Filled 2011-10-08 (×4): qty 2

## 2011-10-08 MED ORDER — SODIUM CHLORIDE 0.9 % IV SOLN
INTRAVENOUS | Status: DC
  Administered 2011-10-08: 08:00:00 via INTRAVENOUS
  Filled 2011-10-08 (×4): qty 1000

## 2011-10-08 MED ORDER — POTASSIUM CHLORIDE 10 MEQ/100ML IV SOLN
10.0000 meq | INTRAVENOUS | Status: AC
  Administered 2011-10-08 (×2): 10 meq via INTRAVENOUS
  Filled 2011-10-08 (×2): qty 100

## 2011-10-08 MED ORDER — ONDANSETRON HCL 4 MG/2ML IJ SOLN
INTRAMUSCULAR | Status: AC
  Administered 2011-10-08: 4 mg
  Filled 2011-10-08: qty 2

## 2011-10-08 MED ORDER — SODIUM CHLORIDE 0.9 % IV SOLN
INTRAVENOUS | Status: DC
  Administered 2011-10-08: 03:00:00 via INTRAVENOUS

## 2011-10-08 MED ORDER — NICARDIPINE HCL IN NACL 20-0.86 MG/200ML-% IV SOLN
5.0000 mg/h | INTRAVENOUS | Status: DC
  Administered 2011-10-08: 5 mg/h via INTRAVENOUS
  Administered 2011-10-08: 4 mg/h via INTRAVENOUS
  Administered 2011-10-08: 5 mg/h via INTRAVENOUS
  Administered 2011-10-09: 4 mg/h via INTRAVENOUS
  Administered 2011-10-09: 6 mg/h via INTRAVENOUS
  Administered 2011-10-09: 4 mg/h via INTRAVENOUS
  Administered 2011-10-09 – 2011-10-10 (×4): 6 mg/h via INTRAVENOUS
  Administered 2011-10-10: 3 mg/h via INTRAVENOUS
  Filled 2011-10-08 (×14): qty 200

## 2011-10-08 MED ORDER — POTASSIUM CHLORIDE 10 MEQ/100ML IV SOLN
10.0000 meq | Freq: Once | INTRAVENOUS | Status: AC
  Administered 2011-10-08: 10 meq via INTRAVENOUS
  Filled 2011-10-08: qty 100

## 2011-10-08 MED ORDER — SODIUM CHLORIDE 0.9 % IV SOLN
25.0000 ug/h | INTRAVENOUS | Status: AC
  Administered 2011-10-08: 25 ug/h via INTRAVENOUS
  Filled 2011-10-08: qty 1

## 2011-10-08 MED ORDER — SENNOSIDES-DOCUSATE SODIUM 8.6-50 MG PO TABS
1.0000 | ORAL_TABLET | Freq: Two times a day (BID) | ORAL | Status: DC
  Filled 2011-10-08 (×2): qty 1

## 2011-10-08 MED ORDER — ONDANSETRON HCL 4 MG/2ML IJ SOLN
4.0000 mg | Freq: Four times a day (QID) | INTRAMUSCULAR | Status: DC | PRN
  Filled 2011-10-08: qty 2

## 2011-10-08 MED ORDER — SODIUM CHLORIDE 0.9 % IV SOLN
8.0000 mg/h | INTRAVENOUS | Status: DC
  Administered 2011-10-08 (×2): 8 mg/h via INTRAVENOUS
  Filled 2011-10-08 (×5): qty 80

## 2011-10-08 MED ORDER — LABETALOL HCL 5 MG/ML IV SOLN
INTRAVENOUS | Status: AC
  Administered 2011-10-08: 10 mg
  Filled 2011-10-08: qty 4

## 2011-10-08 NOTE — Progress Notes (Signed)
OT Cancellation Note  Treatment cancelled today due to:  OT order received and noted to begin on 10/09/11. Please increase activity orders as appropriate.    Carl [name], Carl [name]   OTR/L Pager: [phone] Office: [phone] .

## 2011-10-08 NOTE — Consult Note (Signed)
Reason for Consult:stroke code Referring Physician: dr.Pickering  CC: right HP  HPI: Carl [name] is an 66 y.o. male white presenting in the ER with sudden onset right hemiparesis and hematemesis.  The patient is encephalopathic so most of the history is coming from his family.  [name] tells me that the patient was found on the floor by his son earlier tonight around 10:45 pm.  The patient had bowel incontinence and had vomited his dinner.  [name] and wife tried to put the patient in the shower at which point he started vomiting blood. The family then called 911 and the patient was brought to the ER. The way to the ER and EMS vehicle the patient continued to have bloody vomit. His blood pressures were elevated in the ER and he was aphasic and able to are only a few words. He had right-sided facial droop as well as the right arm and leg weakness. The patient was unable at that point to hold his arm up. During the course of history taking and examination the patient  Improved in strength and speech.   according to the patient's wife and son comment the patient takes about 10-20 Tylenol that day for chronic neck pain. He also had bloody vomit last year and was found to have 2 ulcers. However comment that time he did not have any asymmetric weakness normal with the vomiting is excessive. The patient is a recovering alcoholic and has been going to [employer] The wife is not sure, however, if he has stopped shrinking alcohol altogether.  Past Medical History  Diagnosis Date  . Gout   . Hypertension   . Osteoarthritis     neck  . Impaired fasting glucose   . Alcoholism     sober 2011  . Gastric ulcer 2011  . Duodenal ulcer 2011    Past Surgical History  Procedure Date  . Cystogram 03/08    Family History  Problem Relation Age of Onset  . Fibromyalgia Mother   . Diabetes Neg Hx     Social History:  reports that he quit smoking about 33 years ago. He does not have any  smokeless tobacco history on file. He reports that he drinks alcohol. His drug history not on file.  Allergies  Allergen Reactions  . Hydrochlorothiazide Other (See Comments)    unknown  . Ramipril Other (See Comments)    unknown    Medications: I have reviewed the patient's current medications.  ROS: Unable to obtain  Physical Examination: Blood pressure 193/104, pulse 69, resp. rate 23, SpO2 97.00%.  general: The patient is somnolent. Attention is decreased. She's not following all commands exam is limited. CV: Heart tones S1 and S2 are heard, no S3/S4 or carotid bruits appreciated. There is a subtle murmur her left intercostal space 2 Lungs: Clear to auscultation bilaterally in the front fields Abdomen: Soft  Neurologic Examination Naming/repetition/comprehension slightly impaired, and speech is slurred speech output is decreased. Pupils are equal round and reactive to light. Extraocular movements are intact. Right facial droop. Tongue midline. Motor: Left upper and lower extremity 5/5; right upper and lower extremities 3-4/5, normal tone Sensory: Intact to soft touch and pinprick, and not sure if this is reliable. \ Reflexes:  Diminished overall Coordination: Unable to assess   Results for orders placed during the hospital encounter of 10/08/11 (from the past 48 hour(s))  CBC     Status: Abnormal   Collection Time   10/08/11 12:44  AM      Component Value Range Comment   WBC 8.5  4.0 - 10.5 (K/uL)    RBC 4.17 (*) 4.22 - 5.81 (MIL/uL)    Hemoglobin 13.7  13.0 - 17.0 (g/dL)    HCT 45.4 (*) 09.8 - 52.0 (%)    MCV 93.0  78.0 - 100.0 (fL)    MCH 32.9  26.0 - 34.0 (pg)    MCHC 35.3  30.0 - 36.0 (g/dL)    RDW 11.9  14.7 - 82.9 (%)    Platelets 146 (*) 150 - 400 (K/uL)   COMPREHENSIVE METABOLIC PANEL     Status: Abnormal   Collection Time   10/08/11 12:44 AM      Component Value Range Comment   Sodium 137  135 - 145 (mEq/L)    Potassium 3.0 (*) 3.5 - 5.1 (mEq/L)    Chloride 100   96 - 112 (mEq/L)    CO2 22  19 - 32 (mEq/L)    Glucose, Bld 171 (*) 70 - 99 (mg/dL)    BUN 17  6 - 23 (mg/dL)    Creatinine, Ser 5.62  0.50 - 1.35 (mg/dL)    Calcium 9.3  8.4 - 10.5 (mg/dL)    Total Protein 7.8  6.0 - 8.3 (g/dL)    Albumin 4.1  3.5 - 5.2 (g/dL)    AST 19  0 - 37 (U/L)    ALT 17  0 - 53 (U/L)    Alkaline Phosphatase 37 (*) 39 - 117 (U/L)    Total Bilirubin 0.6  0.3 - 1.2 (mg/dL)    GFR calc non Af Amer 90 (*) >90 (mL/min)    GFR calc Af Amer >90  >90 (mL/min)   PROTIME-INR     Status: Normal   Collection Time   10/08/11 12:44 AM      Component Value Range Comment   Prothrombin Time 13.2  11.6 - 15.2 (seconds)    INR 0.98  0.00 - 1.49    APTT     Status: Normal   Collection Time   10/08/11 12:44 AM      Component Value Range Comment   aPTT 25  24 - 37 (seconds)   TROPONIN I     Status: Normal   Collection Time   10/08/11 12:44 AM      Component Value Range Comment   Troponin I <0.30  <0.30 (ng/mL)   SALICYLATE LEVEL     Status: Abnormal   Collection Time   10/08/11 12:44 AM      Component Value Range Comment   Salicylate Lvl <2.0 (*) 2.8 - 20.0 (mg/dL)   DIFFERENTIAL     Status: Abnormal   Collection Time   10/08/11 12:44 AM      Component Value Range Comment   Neutrophils Relative 88 (*) 43 - 77 (%)    Neutro Abs 7.5  1.7 - 7.7 (K/uL)    Lymphocytes Relative 6 (*) 12 - 46 (%)    Lymphs Abs 0.5 (*) 0.7 - 4.0 (K/uL)    Monocytes Relative 6  3 - 12 (%)    Monocytes Absolute 0.5  0.1 - 1.0 (K/uL)    Eosinophils Relative 0  0 - 5 (%)    Eosinophils Absolute 0.0  0.0 - 0.7 (K/uL)    Basophils Relative 0  0 - 1 (%)    Basophils Absolute 0.0  0.0 - 0.1 (K/uL)   AMMONIA     Status: Normal  Collection Time   10/08/11 12:55 AM      Component Value Range Comment   Ammonia 39  11 - 60 (umol/L)     No results found for this or any previous visit (from the past 240 hour(s)).  Ct Head Wo Contrast  10/08/2011  *RADIOLOGY REPORT*  Clinical Data: Weakness, vomiting, code  stroke.  CT HEAD WITHOUT CONTRAST  Technique:  Contiguous axial images were obtained from the base of the skull through the vertex without contrast.  Comparison: None.  Findings: Intraparenchymal hemorrhage centered within the left thalamus/basal ganglia.  There is no significant mass effect at this time as the hemorrhage is decompressing into the ventricular system. No overt hydrocephalus at this time.  No midline shift or herniation.  Periventricular and subcortical white matter hypodensities are most in keeping with chronic microangiopathic change.The visualized paranasal sinuses and mastoid air cells are predominately clear.  IMPRESSION: Left thalamic/basal ganglia intraparenchymal hemorrhage, with extension into the ventricular system.  Critical Value/emergent results were called by telephone at the time of interpretation on 10/08/2011  at 1/5 a.m.  to  Dr. Rubin Payor, who verbally acknowledged these results.  Original Report Authenticated By: Waneta Martins, M.D.   Dg Chest Port 1 View  10/08/2011  *RADIOLOGY REPORT*  Clinical Data: Stroke, vomiting  PORTABLE CHEST - 1 VIEW  Comparison: None.  Findings: Low lung volumes.  Mild perihilar and bibasilar interstitial edema or infiltrates.  There is patchy left retrocardiac consolidation / atelectasis.  No definite effusion. Heart size upper limits normal for technique.  Regional bones unremarkable.  IMPRESSION:  1.  Low volumes with mild bilateral edema or infiltrates.  Original Report Authenticated By: Osa Craver, M.D.     Assessment/Plan:  the patient is a 67 year old white male with past medical history of hypertension, gastric/duodenal ulcers, chronic neck pain with excessive Tylenol use, recovering alcoholic presenting in the ER with right hemiparesis /aphasia and hematemesis; CT scan of the head shows left basal ganglia hemorrhage with slight midline shift. Patient improved slightly clinically since the ER presentation.   1. ICU bed with  frequent neuro checks 2. Maintain blood pressure 3 GI consult 4. Above discussed with family and questions answered.    Arita Miss, MD [facility] 10/08/2011, 2:15 AM

## 2011-10-08 NOTE — ED Notes (Signed)
10 mg labetalol given IV per Neurologist.

## 2011-10-08 NOTE — Consult Note (Signed)
Patient seen, examined, and I agree with the above documentation, including the assessment and plan. Pt with hemorrhagic stroke, on nicardipine gtt to lower BP at present. Hgb is stable and in fact normal without evidence for active bleeding Will plan EGD once stable from neuro standpt and fit for sedation. Cont ppi gtt, no def evidence for portal htn/cirrhosis (normal plts), but octreotide gtt with little downside

## 2011-10-08 NOTE — ED Notes (Signed)
PT last seen normal at 2200. Family reports bright red vomiting; hx alcohol abuse; acetaminophen abuse; right sided facial droop and slurred speech.

## 2011-10-08 NOTE — Consult Note (Signed)
Gastro Consult: 10:45 AM 10/08/2011   Referring Provider:  ED Dr Rubin Payor.  Pt on stroke service. Primary Care Physician:  Dr Gavin Potters at [facility] medicine clinic in [facility] Primary Gastroenterologist:  Dr. ?   Family does not recall name of GI MD who did EGD in 04/2010  Reason for Consultation:  Hemetemesis.  HPI: Carl [name] is a 66 y.o. male.  He is an alcoholic with hx UGIB from gastric and duodenal ulcers in Aug 2011.  Treated at [facility] with EGD, PPI.  Was using a lot of Excedrin and Ibuprofen at the time.  Did not require transfusion.  Had follow up EGD showing resolving vs resolved ulcers.  Was using PPI but not in over 16 months.   Doing well with no active GI sxs until 10 PM yesterday.  Found on floor in living room with dark bloody emesis all over him and the floor.  Family got him showered, layed him down and he threw up clots of dark blood.  He was incontinent of melena along with the emesis.   During this time he was noted to have right sided weakness, facial drooping and altered speech.    In ED Hgb 13.7, BUN 17, PT 13.7.  LFTs normal except the Alk Phos is low at 37.  Glucose is 171.   BP as high as 205/ 112. 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   Pt has chronic neck pain and has recently been seen by neurosurgeon,  Follow up visit after imaging studies has not happened yet.  Does not use ASA, NSAIDs.  Uses 10 to 20 Acetominophen daily  He drinks at least 4 8oz glasses of wine daily.  Has been involved with AA in past.  No hx of DTs or withdrawal seizures.  So far Sandostatin, Nicardipine hung, Protonix ordered 40 mg IV q 24 hours.   Past Medical History  Diagnosis Date  . Gout   . Hypertension   . Osteoarthritis     neck  . Impaired fasting glucose   . Alcoholism     still drinking wine as of Feb 2013  . Gastric ulcer 2011    egd at [facility] Aug 2011.    . Duodenal ulcer 2011    Past Surgical History  Procedure Date  . Cystogram 03/08    Prior to Admission medications   Medication Sig Start Date End Date Taking? Authorizing Provider  acetaminophen (TYLENOL) 500 MG tablet Take 500 mg by mouth 3 (three) times daily as needed. For pain   Yes Historical Provider, MD  atenolol (TENORMIN) 100 MG tablet Take 100 mg by mouth 2 (two) times daily.     Yes Historical Provider, MD  cloNIDine (CATAPRES) 0.1 MG tablet Take 0.1 mg by mouth 2 (two) times daily.   Yes Historical Provider, MD  doxazosin (CARDURA) 8 MG tablet Take 8 mg by mouth at bedtime.     Yes Historical Provider, MD  doxepin (SINEQUAN) 10 MG capsule Take 10 mg by mouth at bedtime.   Yes Historical Provider, MD  hydrALAZINE (APRESOLINE) 100 MG tablet Take 100 mg by mouth 3 (three) times daily.   Yes Historical Provider, MD  losartan (COZAAR) 50 MG tablet Take 50 mg by mouth daily.   Yes Historical Provider, MD  traMADol (ULTRAM) 50 MG tablet Take 50 mg by mouth every 6 (six) hours as needed. For pain   Yes Historical Provider, MD  Scheduled Meds:    . labetalol      . octreotide (SANDOSTATIN) infusion  25-50 mcg/hr Intravenous To Major  . ondansetron      . pantoprazole (PROTONIX) IV  40 mg Intravenous QHS  . potassium chloride  10 mEq Intravenous Q1 Hr x 3  . senna-docusate  1 tablet Oral BID  . DISCONTD: acetaminophen  650 mg Oral Once   Infusions:    . niCARDipine 3 mg/hr (10/08/11 1041)  . octreotide (SANDOSTATIN) infusion 25 mcg/hr (10/08/11 1000)  . sodium chloride 0.9 % 1,000 mL with potassium chloride 20 mEq infusion 75 mL/hr at 10/08/11 0807  . DISCONTD: sodium chloride Stopped (10/08/11 0807)  . DISCONTD: niCARDipine     PRN Meds: acetaminophen, acetaminophen, labetalol, ondansetron (ZOFRAN) IV   Allergies as of 10/08/2011 - Review Complete 10/08/2011  Allergen Reaction Noted  . Hydrochlorothiazide Other (See Comments) 07/16/2005  . Ramipril Other (See  Comments)     Family History  Problem Relation Age of Onset  . Fibromyalgia Mother   . Diabetes Neg Hx     History   Social History  . Marital Status: Married    Spouse Name: N/A    Number of Children: 2  . Years of Education: N/A   Occupational History  . retired    Social History Main Topics  . Smoking status: Former Smoker    Quit date: 09/02/1978  . Smokeless tobacco: Not on file  . Alcohol Use: Yes  . Drug Use: Not on file  . Sexually Active: Not on file   Other Topics Concern  . Not on file   Social History Narrative   Enjoys violin    REVIEW OF SYSTEMS: Constitutional:  Weight stable.  Appetite variable. ENT:  No nose bleeds or sinus congestion.  Left eye pain that radiates up from left face and shoulder.  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 Pulm:  When neck/shoulder pain severe it can cause a dry cough CV:  No palpitations or chest pain GU:  Nocturia, no hematuria GI:  As above.  No dysphagia Heme:  No unusual bleeding or bruising.    Transfusions:  None in past. Neuro:  No headaches.  Lots of  M/S:  Chronic neck and shoulder pain.  No swollen joints. Derm:  No rash or itching Endocrine:  Hx glucose intolerance Immunization:  No flu shot this year. Travel:  None.    PHYSICAL EXAM: Vital signs in last 24 hours: Temp:  [97.8 F (36.6 C)-97.9 F (36.6 C)] 97.9 F (36.6 C) (02/05 0715) Pulse Rate:  [54-83] 83  (02/05 1030) Resp:  [15-36] 24  (02/05 1030) BP: (130-205)/(73-112) 137/78 mmHg (02/05 1030) SpO2:  [94 %-100 %] 97 % (02/05 1030) Weight:  [205 lb 0.4 oz (93 kg)] 205 lb 0.4 oz (93 kg) (02/05 0245)  General: Dysarthric overweight white male who looks stated age. Head:  Atraumatic,  Left lip droop is slight.  Eyes:  No icterus or pallor Ears:  Slight HOH  Nose:  No blood or discharge Mouth:  Dry blood in mouth, tongue with dark, rusty discoloration.  Poor dentition. Neck:  No masses or JVD Lungs:  Clear B. Occ dry cough.  No  increased work of breathing Heart: RRR.  No MRG. Abdomen:  Soft, NT, ND, slightly obese.  Active BS.   Rectal: not done   Musc/Skeltl: no gross deformities Extremities:  No pedal edema.  hands and lower arms puffy.  Neurologic:  Speech is difficult to  understand as it is garbled.  He is alert and oriented x 3.  Appropriate.  Grip and pedal strength symmetric and full B.  Involuntary lower extremity movements bil. Skin:  No rash or sores.  Random scratches on arms. Facial rubor.  No spider angioma on trunk.  Tattoos:  None seen Nodes:  None at neck or groin   Psych:  Pleasant, cooperative.  Intake/Output from previous day: 02/04 0701 - 02/05 0700 In: 687.5 [I.V.:687.5] Out: -  Intake/Output this shift: Total I/O In: 339.6 [I.V.:339.6] Out: -   LAB RESULTS:  Basename 10/08/11 0044  WBC 8.5  HGB 13.7  HCT 38.8*  PLT 146*   BMET Lab Results  Component Value Date   NA 137 10/08/2011   NA 139 06/15/2010   NA 145 01/10/2009   K 3.0* 10/08/2011   K 3.9 06/15/2010   K 3.4* 01/10/2009   CL 100 10/08/2011   CL 104 06/15/2010   CL 108 01/10/2009   CO2 22 10/08/2011   CO2 27 06/15/2010   CO2 29 01/10/2009   GLUCOSE 171* 10/08/2011   GLUCOSE 119* 06/15/2010   GLUCOSE 118* 01/10/2009   BUN 17 10/08/2011   BUN 27* 06/15/2010   BUN 13 01/10/2009   CREATININE 0.84 10/08/2011   CREATININE 1.0 06/15/2010   CREATININE 1.1 01/10/2009   CALCIUM 9.3 10/08/2011   CALCIUM 9.6 06/15/2010   CALCIUM 8.9 01/10/2009   LFT Lab Results  Component Value Date   PROT 7.8 10/08/2011   PROT 7.0 06/15/2010   PROT 8.3 04/24/2009   ALBUMIN 4.1 10/08/2011   ALBUMIN 4.0 06/15/2010   ALBUMIN 3.9 04/24/2009   AST 19 10/08/2011   AST 19 06/15/2010   AST 50* 04/24/2009   ALT 17 10/08/2011   ALT 19 06/15/2010   ALT 51 04/24/2009   ALKPHOS 37* 10/08/2011   ALKPHOS 38* 06/15/2010   ALKPHOS 58 04/24/2009   BILITOT 0.6 10/08/2011   BILITOT 0.4 06/15/2010   BILITOT 2.4* 04/24/2009   BILIDIR 0.0 06/15/2010   BILIDIR 0.4* 04/24/2009     BILIDIR 0.2 01/10/2009   PT/INR Lab Results  Component Value Date   INR 0.98 10/08/2011   Hepatitis Panel No results found for this basename: HEPBSAG,HCVAB,HEPAIGM,HEPBIGM in the last 72 hours C-Diff No components found with this basename: cdiff    Drugs of Abuse     Component Value Date/Time   LABOPIA NONE DETECTED 10/08/2011 0245   COCAINSCRNUR NONE DETECTED 10/08/2011 0245   LABBENZ NONE DETECTED 10/08/2011 0245   AMPHETMU NONE DETECTED 10/08/2011 0245   THCU NONE DETECTED 10/08/2011 0245   LABBARB NONE DETECTED 10/08/2011 0245     RADIOLOGY STUDIES: Ct Head Wo Contrast  10/08/2011  *RADIOLOGY REPORT*  Clinical Data: Hemorrhage, follow-up  CT HEAD WITHOUT CONTRAST  Technique:  Contiguous axial images were obtained from the base of the skull through the vertex without contrast.  Comparison: Exam of 1019 hours compared to 10/08/2011 at 0056 hours  Findings: Generalized atrophy. Again identified large left thalamic hemorrhagic infarct, 3.1 x 2.1 cm, grossly stable. Intraventricular extension of hemorrhage with high attenuation blood seen within the lateral ventricles, third ventricle and fourth ventricle. Minimal mass effect. Small vessel chronic ischemic changes of deep cerebral white matter. No additional areas of hemorrhage or infarction identified. No midline shift. Atherosclerotic calcifications of the skull base. Visualized paranasal sinuses and mastoid air cells clear. Bones demineralized.  IMPRESSION: Stable appearance of large left thalamic hemorrhagic infarct with intra-articular extension of hemorrhage. No significant interval  change.  Original Report Authenticated By: Lollie Marrow, M.D.   Ct Head Wo Contrast  10/08/2011  *RADIOLOGY REPORT*  Clinical Data: Weakness, vomiting, code stroke.  CT HEAD WITHOUT CONTRAST  Technique:  Contiguous axial images were obtained from the base of the skull through the vertex without contrast.  Comparison: None.  Findings: Intraparenchymal hemorrhage  centered within the left thalamus/basal ganglia.  There is no significant mass effect at this time as the hemorrhage is decompressing into the ventricular system. No overt hydrocephalus at this time.  No midline shift or herniation.  Periventricular and subcortical white matter hypodensities are most in keeping with chronic microangiopathic change.The visualized paranasal sinuses and mastoid air cells are predominately clear.  IMPRESSION: Left thalamic/basal ganglia intraparenchymal hemorrhage, with extension into the ventricular system.  Critical Value/emergent results were called by telephone at the time of interpretation on 10/08/2011  at 1/5 a.m.  to  Dr. Rubin Payor, who verbally acknowledged these results.  Original Report Authenticated By: Waneta Martins, M.D.   Dg Chest Port 1 View  10/08/2011  *RADIOLOGY REPORT*  Clinical Data: Stroke, vomiting  PORTABLE CHEST - 1 VIEW  Comparison: None.  Findings: Low lung volumes.  Mild perihilar and bibasilar interstitial edema or infiltrates.  There is patchy left retrocardiac consolidation / atelectasis.  No definite effusion. Heart size upper limits normal for technique.  Regional bones unremarkable.  IMPRESSION:  1.  Low volumes with mild bilateral edema or infiltrates.  Original Report Authenticated By: Osa Craver, M.D.    ENDOSCOPIC STUDIES: EGD in August 2011.  Found gastric, duodenal ulcers according to wife and Dr Karle Starch records.  Does not recall name of GI MD at [facility]. Never had colonoscopy  IMPRESSION: 1.  Acute hemetemesis, r/o recurrent ulcer, r/o  2.  Acute  Hemorrhagic CVA with right sided weakness and aphasia.  3.  Chronic neck pain.     4.  Alcoholism. Though platelets are low, do not see evidence of cirrhosis, though he is at risk for this. 5.  Hypertensive crisis.  Improved on Nicardipine drip. 6.  Diabetic range glucose.  PLAN: 1.  Change to Protonix drip.  Serial CBCs, next due at 11:30 2.  NPO.  Will need swallow  eval. 3.  EGD, timing to be determined.  Would require sedation, which would hamper accurate neuro assessments.  No EGD for now unless developes ongoing hemorrhage.    LOS: 0 days   Jennye Moccasin  10/08/2011, 10:45 AM Pager: [phone]

## 2011-10-08 NOTE — Consult Note (Signed)
Name: Carl [name] MRN: [id number] DOB: 1946-08-23    LOS: 0 Requesting MD: Stroke Team  PCCM CONSULT NOTE  History of Present Illness: 66 yo wm with hx of ETOH abuse who presented 2/5 with new onset R hemiparesis and bloody emesis. He was noted to be aphasic and CT head revealed L thalamic/basal ganglia intraparenchymal hemorrhage, with extension into the ventricular system. He requires vasoactive drip for HTN and was admitted to Neuro ICU on Stroke Team service. PCCM asked to follow while in ICU.  Lines / Drains:   Cultures:   Antibiotics:   Tests / Events: Octreotide drip 2/5>>  Subjective: Expressive aphasia  Past Medical History  Diagnosis Date  . Gout   . Hypertension   . Osteoarthritis     neck  . Impaired fasting glucose   . Alcoholism     sober 2011  . Gastric ulcer 2011  . Duodenal ulcer 2011   Past Surgical History  Procedure Date  . Cystogram 03/08   Prior to Admission medications   Medication Sig Start Date End Date Taking? Authorizing Provider  acetaminophen (TYLENOL) 500 MG tablet Take 500 mg by mouth 3 (three) times daily as needed. For pain   Yes Historical Provider, MD  atenolol (TENORMIN) 100 MG tablet Take 100 mg by mouth 2 (two) times daily.     Yes Historical Provider, MD  cloNIDine (CATAPRES) 0.1 MG tablet Take 0.1 mg by mouth 2 (two) times daily.   Yes Historical Provider, MD  doxazosin (CARDURA) 8 MG tablet Take 8 mg by mouth at bedtime.     Yes Historical Provider, MD  doxepin (SINEQUAN) 10 MG capsule Take 10 mg by mouth at bedtime.   Yes Historical Provider, MD  hydrALAZINE (APRESOLINE) 100 MG tablet Take 100 mg by mouth 3 (three) times daily.   Yes Historical Provider, MD  losartan (COZAAR) 50 MG tablet Take 50 mg by mouth daily.   Yes Historical Provider, MD  traMADol (ULTRAM) 50 MG tablet Take 50 mg by mouth every 6 (six) hours as needed. For pain   Yes Historical Provider, MD   Allergies Allergies  Allergen Reactions  .  Hydrochlorothiazide Other (See Comments)    unknown  . Ramipril Other (See Comments)    unknown    Family History Family History  Problem Relation Age of Onset  . Fibromyalgia Mother   . Diabetes Neg Hx     Social History  reports that he quit smoking about 33 years ago. He does not have any smokeless tobacco history on file. He reports that he drinks alcohol. His drug history not on file.  Review Of Systems  11 points review of systems is negative with an exception of listed in HPI. Poor historian.  Vital Signs: Temp:  [97.8 F (36.6 C)-97.9 F (36.6 C)] 97.9 F (36.6 C) (02/05 0715) Pulse Rate:  [54-83] 73  (02/05 0900) Resp:  [15-36] 18  (02/05 0900) BP: (130-205)/(73-112) 155/97 mmHg (02/05 0900) SpO2:  [94 %-100 %] 99 % (02/05 0900) Weight:  [205 lb 0.4 oz (93 kg)] 205 lb 0.4 oz (93 kg) (02/05 0245) I/O last 3 completed shifts: In: 687.5 [I.V.:687.5] Out: -   Physical Examination: General:  NAD x slightly fidgety  Neuro: rt facial droop. Rt hemiparesis.  Partial expressive aphasia HEENT: no jvd Cardiovascular: hsr rrr Lungs: deceased bs bases Abdomen:  Obese + bs Musculoskeletal: intact Skin:  warm  Ventilator settings:    Labs and Imaging:  Ct Head Wo Contrast  10/08/2011  *RADIOLOGY REPORT*  Clinical Data: Weakness, vomiting, code stroke.  CT HEAD WITHOUT CONTRAST  Technique:  Contiguous axial images were obtained from the base of the skull through the vertex without contrast.  Comparison: None.  Findings: Intraparenchymal hemorrhage centered within the left thalamus/basal ganglia.  There is no significant mass effect at this time as the hemorrhage is decompressing into the ventricular system. No overt hydrocephalus at this time.  No midline shift or herniation.  Periventricular and subcortical white matter hypodensities are most in keeping with chronic microangiopathic change.The visualized paranasal sinuses and mastoid air cells are predominately clear.   IMPRESSION: Left thalamic/basal ganglia intraparenchymal hemorrhage, with extension into the ventricular system.  Critical Value/emergent results were called by telephone at the time of interpretation on 10/08/2011  at 1/5 a.m.  to  Dr. Rubin Payor, who verbally acknowledged these results.  Original Report Authenticated By: Waneta Martins, M.D.   Dg Chest Port 1 View  10/08/2011  *RADIOLOGY REPORT*  Clinical Data: Stroke, vomiting  PORTABLE CHEST - 1 VIEW  Comparison: None.  Findings: Low lung volumes.  Mild perihilar and bibasilar interstitial edema or infiltrates.  There is patchy left retrocardiac consolidation / atelectasis.  No definite effusion. Heart size upper limits normal for technique.  Regional bones unremarkable.  IMPRESSION:  1.  Low volumes with mild bilateral edema or infiltrates.  Original Report Authenticated By: Osa Craver, M.D.    Lab 10/08/11 0044  NA 137  K 3.0*  CL 100  CO2 22  BUN 17  CREATININE 0.84  GLUCOSE 171*    Lab 10/08/11 0044  HGB 13.7  HCT 38.8*  WBC 8.5  PLT 146*   ABG No results found for this basename: phart, pco2, pco2art, po2, po2art, hco3, tco2, acidbasedef, o2sat    Assessment and Plan: Stroke  -per Neuro -if worsens may need intubation and interventions.  GI bleed: -per GI   HTN: -Off nicardipine infusion -goal per Neuro  ETOH abuse: -add folic acid/thiamine   Best practices / Disposition: -->ICU status under Stroke team -->full code -->PAS for DVT Px -->Protonix for GI Px -->diet per Neuro   Brett Canales Minor ACNP Adolph Pollack PCCM Pager [phone] till 3 pm If no answer page [phone] 10/08/2011, 9:31 AM  Pt seen and examined and database reviewed. I agree with above findings, assessment and plan. There are no active issues that require our direct intervention or mgmt. We will keep an eye on him from afar and will be ready to jump in if his condition deteriorates such that he requires intubation or more aggressive  intervention surrounding the UGIB  Billy Fischer, MD;  PCCM service; Mobile [phone]

## 2011-10-08 NOTE — ED Notes (Signed)
PT saline locked per neurology request. PT drowsy but arousable to minor stimuli.

## 2011-10-08 NOTE — Progress Notes (Signed)
Stroke Team Progress Note  HISTORY Carl [name] is an 66 y.o. male white presenting in the ER with sudden onset right hemiparesis and hematemesis. The patient is encephalopathic so most of the history is coming from his family. [name] tells me that the patient was found on the floor by his son earlier tonight around 10:45 pm. The patient had bowel incontinence and had vomited his dinner. [name] and wife tried to put the patient in the shower at which point he started vomiting blood. The family then called 911 and the patient was brought to the ER. The way to the ER and EMS vehicle the patient continued to have bloody vomit. His blood pressures were elevated in the ER and he was aphasic and able to are only a few words. He had right-sided facial droop as well as the right arm and leg weakness. The patient was unable at that point to hold his arm up. During the course of history taking and examination the patient Improved in strength and speech.  according to the patient's wife and son comment the patient takes about 10-20 Tylenol that day for chronic neck pain. He also had bloody vomit last year and was found to have 2 ulcers. However comment that time he did not have any asymmetric weakness normal with the vomiting is excessive.  The patient is a recovering alcoholic and has been going to [employer] The wife is not sure, however, if he has stopped drinking alcohol altogether  SUBJECTIVE No family is at the bedside. He is at risk for neuro worsening. Pt remains NPO. Swallow screen not yet done.  OBJECTIVE Filed Vitals:   10/08/11 0630 10/08/11 0645 10/08/11 0700 10/08/11 0715  BP: 167/103 174/100 182/104 178/93  Pulse: 71 75 83 72  Temp:      TempSrc:      Resp: 19 19 20 19   Height:      Weight:      SpO2: 99% 99% 96% 98%    CBG (last 3) No results found for this basename: GLUCAP:3 in the last 72 hours Intake/Output from previous day: 02/04 0701 - 02/05 0700 In: 687.5  [I.V.:687.5] Out: -   IV Fluid Intake:     . sodium chloride 125 mL/hr at 10/08/11 0700  . niCARDipine 5 mg/hr (10/08/11 0724)  . octreotide (SANDOSTATIN) infusion 25 mcg/hr (10/08/11 0700)  . DISCONTD: niCARDipine     Medications    . labetalol      . octreotide (SANDOSTATIN) infusion  25-50 mcg/hr Intravenous To Major  . ondansetron      . pantoprazole (PROTONIX) IV  40 mg Intravenous QHS  . senna-docusate  1 tablet Oral BID  . DISCONTD: acetaminophen  650 mg Oral Once  PRN acetaminophen, acetaminophen, labetalol, ondansetron (ZOFRAN) IV  Diet:  NPO  Activity:  Bedrest  DVT Prophylaxis:  SCDs   Significant Diagnostic Studies: CBC    Component Value Date/Time   WBC 8.5 10/08/2011 0044   RBC 4.17* 10/08/2011 0044   HGB 13.7 10/08/2011 0044   HCT 38.8* 10/08/2011 0044   PLT 146* 10/08/2011 0044   MCV 93.0 10/08/2011 0044   MCH 32.9 10/08/2011 0044   MCHC 35.3 10/08/2011 0044   RDW 13.3 10/08/2011 0044   LYMPHSABS 0.5* 10/08/2011 0044   MONOABS 0.5 10/08/2011 0044   EOSABS 0.0 10/08/2011 0044   BASOSABS 0.0 10/08/2011 0044   CMP    Component Value Date/Time   NA 137 10/08/2011 0044  K 3.0* 10/08/2011 0044   CL 100 10/08/2011 0044   CO2 22 10/08/2011 0044   GLUCOSE 171* 10/08/2011 0044   BUN 17 10/08/2011 0044   CREATININE 0.84 10/08/2011 0044   CALCIUM 9.3 10/08/2011 0044   PROT 7.8 10/08/2011 0044   ALBUMIN 4.1 10/08/2011 0044   AST 19 10/08/2011 0044   ALT 17 10/08/2011 0044   ALKPHOS 37* 10/08/2011 0044   BILITOT 0.6 10/08/2011 0044   GFRNONAA 90* 10/08/2011 0044   GFRAA >90 10/08/2011 0044   COAGS Lab Results  Component Value Date   INR 0.98 10/08/2011   Lipid Panel    Component Value Date/Time   CHOL 211* 01/15/2007 1027   TRIG 303* 01/15/2007 1027   HDL 38.3* 01/15/2007 1027   CHOLHDL 5.5 CALC 01/15/2007 1027   VLDL 61* 01/15/2007 1027   HgbA1C  Lab Results  Component Value Date   HGBA1C 5.6 11/04/2007   Urine Drug Screen     Component Value Date/Time   LABOPIA NONE DETECTED 10/08/2011 0245    COCAINSCRNUR NONE DETECTED 10/08/2011 0245   LABBENZ NONE DETECTED 10/08/2011 0245   AMPHETMU NONE DETECTED 10/08/2011 0245   THCU NONE DETECTED 10/08/2011 0245   LABBARB NONE DETECTED 10/08/2011 0245    Alcohol Level No results found for this basename: eth    CT of the brain   Left thalamic/basal ganglia intraparenchymal hemorrhage, with extension into the ventricular system.    MRI of the brain    MRA of the brain    2D Echocardiogram    Carotid Doppler    CXR   1.  Low volumes with mild bilateral edema or infiltrates.    EKG  sinus rhythm   Physical Exam    The patient is sleepy, but can be aroused.  The patient has a dysarthric speech pattern, no aphasia.  Respiratory examination is clear.  Cardiovascular examination shows a regular rate and rhythm, no obvious murmurs or rubs.  Abdomen is obese, positive bowel sounds, nontender.  Extremities are without significant edema.  The patient appears to have a depression of the right nasolabial fold. Extraocular movements are full, visual fields are full.  The patient has mild weakness of the right arm greater than right leg, drift of the right upper extremity. Patient has normal strength of the left side.  The patient has some clumsiness with finger-nose-finger with the right arm, normal in the left. The patient is able to perform toe to finger bilaterally.  Deep tendon reflexes are depressed but symmetric.  The patient was not ambulated.    ASSESSMENT [name] is a 66 y.o. 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 hemorrhage secondary to hypertension.   -dysphagia, right hemiparesis and aphasia secondary to hemorrhage -GIB secondary to ulcers, GI consulted -hypokalemia -dyslipidemia -etoh abuse -hypertension -chronic pain with excessive tylenol use  Hospital day # 0  TREATMENT/PLAN -keep NPO - neurosurgery consult Dr. Gerlene Fee - SBP goal < 160 -repeat CT  in am -BMET, CBC w/ Diff  Potassium will be added to the IV fluids. We will follow the CT scan of the head.  A [facility] consult is pending.  I discussed the case with Dr. Gerlene Fee, who wishes to be informed if the patient has decompensation.   The neurologic status will need to be followed closely. The patient is requiring IV Cardizem for blood pressure management.   SHARON BIBY, AVNP, ANP-BC, GNP-BC [facility] Pager:  [phone] 10/08/2011 7:36 AM  Lesly Dukes

## 2011-10-08 NOTE — Progress Notes (Signed)
PT Cancellation Note  Evaluation cancelled today due to on bedrest with order to begin 10/09/11.Marland Kitchen  Carl [name] 10/08/2011, 9:46 AM Pager [phone]

## 2011-10-08 NOTE — ED Provider Notes (Signed)
History     CSN: [id number]  Arrival date & time 10/08/11  0036   First MD Initiated Contact with Patient 10/08/11 0041      Chief Complaint  Patient presents with  . Code Stroke   level V caveat due to altered mental status.  (Consider location/radiation/quality/duration/timing/severity/associated sxs/prior treatment) The history is provided by the patient.   patient came in as a code stroke. He reportedly lasting normal at 10:00. Around 45 minutes later he was seen in her mental status. Prominent right-sided facial droop and slurred speech. He also is having trouble moving his right side. He cannot complete a whole sentence per EMS. He is reportedly been using a lot of acetaminophen for his back pain. He also is a heavy drinker and maybe trying to get help. He also has a history of bleeding ulcers. He vomited up blood for EMS. He had some hypertension for EMS  Past Medical History  Diagnosis Date  . Gout   . Hypertension   . Osteoarthritis     neck  . Impaired fasting glucose   . Alcoholism     sober 2011  . Gastric ulcer 2011  . Duodenal ulcer 2011    Past Surgical History  Procedure Date  . Cystogram 03/08    Family History  Problem Relation Age of Onset  . Fibromyalgia Mother   . Diabetes Neg Hx     History  Substance Use Topics  . Smoking status: Former Smoker    Quit date: 09/02/1978  . Smokeless tobacco: Not on file  . Alcohol Use: Yes      Review of Systems  Unable to perform ROS: Mental status change    Allergies  Hydrochlorothiazide and Ramipril  Home Medications  No current outpatient prescriptions on file.  BP 185/101  Pulse 54  Resp 27  SpO2 98%  Physical Exam  Constitutional: He appears well-developed.  HENT:  Head: Normocephalic.  Cardiovascular: Normal rate and regular rhythm.   Pulmonary/Chest: Effort normal.  Abdominal: Soft. There is no tenderness. There is no rebound.  Musculoskeletal: Normal range of motion.    Neurological:       Patient is awake but has some aphasia. Pupils are reactive. Right extremities are weaker than left. Difficult to understand patient. Complete NIH score done by neurology    ED Course  Procedures (including critical care time)  Labs Reviewed  CBC - Abnormal; Notable for the following:    RBC 4.17 (*)    HCT 38.8 (*)    Platelets 146 (*)    All other components within normal limits  COMPREHENSIVE METABOLIC PANEL - Abnormal; Notable for the following:    Potassium 3.0 (*)    Glucose, Bld 171 (*)    Alkaline Phosphatase 37 (*)    GFR calc non Af Amer 90 (*)    All other components within normal limits  SALICYLATE LEVEL - Abnormal; Notable for the following:    Salicylate Lvl <2.0 (*)    All other components within normal limits  DIFFERENTIAL - Abnormal; Notable for the following:    Neutrophils Relative 88 (*)    Lymphocytes Relative 6 (*)    Lymphs Abs 0.5 (*)    All other components within normal limits  PROTIME-INR  APTT  TROPONIN I  AMMONIA  ACETAMINOPHEN LEVEL  DIFFERENTIAL  URINE RAPID DRUG SCREEN (HOSP PERFORMED)   Ct Head Wo Contrast  10/08/2011  *RADIOLOGY REPORT*  Clinical Data: Weakness, vomiting, code stroke.  CT HEAD WITHOUT  CONTRAST  Technique:  Contiguous axial images were obtained from the base of the skull through the vertex without contrast.  Comparison: None.  Findings: Intraparenchymal hemorrhage centered within the left thalamus/basal ganglia.  There is no significant mass effect at this time as the hemorrhage is decompressing into the ventricular system. No overt hydrocephalus at this time.  No midline shift or herniation.  Periventricular and subcortical white matter hypodensities are most in keeping with chronic microangiopathic change.The visualized paranasal sinuses and mastoid air cells are predominately clear.  IMPRESSION: Left thalamic/basal ganglia intraparenchymal hemorrhage, with extension into the ventricular system.  Critical  Value/emergent results were called by telephone at the time of interpretation on 10/08/2011  at 1/5 a.m.  to  Dr. Rubin Payor, who verbally acknowledged these results.  Original Report Authenticated By: Waneta Martins, M.D.   Dg Chest Port 1 View  10/08/2011  *RADIOLOGY REPORT*  Clinical Data: Stroke, vomiting  PORTABLE CHEST - 1 VIEW  Comparison: None.  Findings: Low lung volumes.  Mild perihilar and bibasilar interstitial edema or infiltrates.  There is patchy left retrocardiac consolidation / atelectasis.  No definite effusion. Heart size upper limits normal for technique.  Regional bones unremarkable.  IMPRESSION:  1.  Low volumes with mild bilateral edema or infiltrates.  Original Report Authenticated By: Thora Lance III, M.D.     1. Intracranial hemorrhage   2. GI bleed      Date: 10/08/2011  Rate: 74  Rhythm: normal sinus rhythm  QRS Axis: normal  Intervals: normal  ST/T Wave abnormalities: normal  Conduction Disutrbances:none  Narrative Interpretation: LVH  Old EKG Reviewed: none available  CRITICAL CARE Performed by: Billee Cashing   Total critical care time: 30  Critical care time was exclusive of separately billable procedures and treating other patients.  Critical care was necessary to treat or prevent imminent or life-threatening deterioration.  Critical care was time spent personally by me on the following activities: development of treatment plan with patient and/or surrogate as well as nursing, discussions with consultants, evaluation of patient's response to treatment, examination of patient, obtaining history from patient or surrogate, ordering and performing treatments and interventions, ordering and review of laboratory studies, ordering and review of radiographic studies, pulse oximetry and re-evaluation of patient's condition.  MDM  Altered mental status with last normal at 10 PM. Deficits improved somewhat here. Right side weakness is improved. He  still has some aphasia. Initial head CT shows intracranial hemorrhage with extension into the ventricles. He has been hypertensive. He's also had hematemesis. No hypotension. Octreotide was started. He had a reported history of Tylenol abuse, but his level was normal and his LFTs look good. He was seen starting in the CAT scanner by Dr. Melven Sartorius from neurology. She will admit the patient. Critical care and GI were consulted        Juliet Rude. Rubin Payor, MD 10/08/11 [phone]

## 2011-10-08 NOTE — Code Documentation (Signed)
Responded to Code Stroke page in ED. Patient found in bathroom at 2245 by family members with  slurred speech and right sided weakness. Per family patient was last seen normal at 2200. Per EMS, patient had an episode of hemataemesis in the field upon their arrival to the home. Upon arrival to the ED, patient presented lethargic, but easily arousable to verbal stimuli, right sided weakness, able to follow commands, speech slurred. Patient taken immediately to CT after assessment per EDP. Dr Melven Sartorius arrived in CT to further evaluate patient. While in CT patient had an episode of hemataemesis, vomitting a large amount of maroon blood. After review of CT scan, patient not a candiate for TPA due to left basal ganglia intraparenchymal hemorrhage.

## 2011-10-09 ENCOUNTER — Inpatient Hospital Stay (HOSPITAL_COMMUNITY): Payer: Medicare Other

## 2011-10-09 DIAGNOSIS — K922 Gastrointestinal hemorrhage, unspecified: Secondary | ICD-10-CM

## 2011-10-09 DIAGNOSIS — F1010 Alcohol abuse, uncomplicated: Secondary | ICD-10-CM

## 2011-10-09 DIAGNOSIS — I517 Cardiomegaly: Secondary | ICD-10-CM

## 2011-10-09 DIAGNOSIS — I629 Nontraumatic intracranial hemorrhage, unspecified: Secondary | ICD-10-CM

## 2011-10-09 DIAGNOSIS — R0989 Other specified symptoms and signs involving the circulatory and respiratory systems: Secondary | ICD-10-CM

## 2011-10-09 LAB — BASIC METABOLIC PANEL
CO2: 26 mEq/L (ref 19–32)
CO2: 25 mEq/L (ref 19–32)
Calcium: 9.2 mg/dL (ref 8.4–10.5)
Creatinine, Ser: 0.69 mg/dL (ref 0.50–1.35)
GFR calc Af Amer: >90 mL/min (ref >90)
GFR calc non Af Amer: >90 mL/min (ref >90)
Glucose, Bld: 101 mg/dL — ABNORMAL HIGH (ref 70–99)
Potassium: 3.7 mEq/L (ref 3.5–5.1)
Sodium: 141 mEq/L (ref 135–145)

## 2011-10-09 LAB — PHOSPHORUS: Phosphorus: 1.4 mg/dL — ABNORMAL LOW (ref 2.3–4.6)

## 2011-10-09 MED ORDER — PANTOPRAZOLE SODIUM 40 MG IV SOLR
40.0000 mg | Freq: Two times a day (BID) | INTRAVENOUS | Status: DC
  Administered 2011-10-09 – 2011-10-11 (×5): 40 mg via INTRAVENOUS
  Filled 2011-10-09 (×6): qty 40

## 2011-10-09 MED ORDER — SODIUM CHLORIDE 0.9 % IV SOLN
INTRAVENOUS | Status: DC
  Administered 2011-10-10 – 2011-10-16 (×7): via INTRAVENOUS
  Filled 2011-10-09 (×13): qty 1000

## 2011-10-09 MED ORDER — STARCH (THICKENING) PO POWD
ORAL | Status: DC | PRN
  Filled 2011-10-09 (×2): qty 227

## 2011-10-09 MED ORDER — SODIUM CHLORIDE 0.9 % IV SOLN
INTRAVENOUS | Status: DC
  Administered 2011-10-09 – 2011-10-10 (×9): via INTRAVENOUS
  Filled 2011-10-09 (×5): qty 1000

## 2011-10-09 MED ORDER — THIAMINE HCL 100 MG/ML IJ SOLN
100.0000 mg | Freq: Every day | INTRAMUSCULAR | Status: DC
  Administered 2011-10-09 – 2011-10-11 (×3): 100 mg via INTRAVENOUS
  Filled 2011-10-09 (×3): qty 1

## 2011-10-09 MED ORDER — FOLIC ACID 5 MG/ML IJ SOLN
1.0000 mg | Freq: Every day | INTRAMUSCULAR | Status: DC
  Administered 2011-10-09 – 2011-10-11 (×3): 1 mg via INTRAVENOUS
  Filled 2011-10-09 (×3): qty 0.2

## 2011-10-09 MED ORDER — M.V.I. ADULT IV INJ
10.0000 mL | INTRAVENOUS | Status: DC
  Filled 2011-10-09: qty 10

## 2011-10-09 MED ORDER — LORAZEPAM 2 MG/ML IJ SOLN
1.0000 mg | INTRAMUSCULAR | Status: DC | PRN
  Administered 2011-10-09 – 2011-10-12 (×4): 1 mg via INTRAVENOUS
  Filled 2011-10-09 (×5): qty 1

## 2011-10-09 NOTE — Evaluation (Signed)
Clinical/Bedside Swallow Evaluation Patient Details  Name: Carl [name] MRN: [id number] DOB: 1945/12/10 Today's Date: 10/09/2011  Past Medical History:  Past Medical History  Diagnosis Date  . Gout   . Hypertension   . Osteoarthritis     neck  . Impaired fasting glucose   . Alcoholism     still drinking wine as of Feb 2013  . Gastric ulcer 2011    egd at [facility] Aug 2011.  . Duodenal ulcer 2011   Past Surgical History:  Past Surgical History  Procedure Date  . Cystogram 03/08   HPI:  66 yr old admitted with acute right sided weakness.  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     Assessment/Recommendations/Treatment Plan   SLP Assessment Clinical Impression Statement: Pt. with evidence of suboptimal tracheal protection with throat clears and immediate cough with water after several cup presentations.  Objective assessment is recommended (MBS) to assess swallow function and to identify safest po consistency. Risk for Aspiration: Severe Other Related Risk Factors: Lethargy  Swallow Evaluation Recommendations Recommended Consults: MBS SCHEDULED TODAY AT 1300 General Recommendation: NPO Oral Care Recommendations: Oral care BID Recommendations for Other Services: Rehab consult Follow up Recommendations:  (TBD)  Treatment Plan Treatment Plan Recommendations:  (WILL DEVELOP TX PLAN AFTER MBS)      Swallow Study  General  HPI: 66 yr old admitted with acute right sided weakness.  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   Type of Study: Bedside swallow evaluation Diet Prior to this Study: NPO Respiratory Status: Room air Behavior/Cognition: Alert;Cooperative Oral Cavity - Dentition: Adequate natural dentition Patient Positioning: Upright in bed Baseline Vocal Quality: Clear;Low vocal intensity Volitional Cough: Strong Volitional Swallow: Able to elicit  Oral Motor/Sensory Function  Overall Oral Motor/Sensory Function:  Impaired Labial ROM: Reduced right Labial Strength: Reduced Lingual ROM: Reduced right Lingual Symmetry: Abnormal symmetry right Lingual Strength: Reduced Velum: Within Functional Limits Mandible: Within Functional Limits  Consistency Results  Ice Chips Ice chips: Within functional limits Presentation: Spoon  Thin Liquid Thin Liquid: Impaired Presentation: Spoon;Cup Pharyngeal  Phase Impairments: Throat Clearing - Immediate;Cough - Immediate  Nectar Thick Liquid Nectar Thick Liquid: Not tested  Honey Thick Liquid Honey Thick Liquid: Not tested  Puree Puree: Within functional limits Presentation: Spoon;Self Fed  Solid Solid: Not tested   Royce Macadamia M.Ed [employer] [phone] 10/09/2011

## 2011-10-09 NOTE — Evaluation (Signed)
Modified Barium Swallow Procedure Note Patient Details  Name: Carl [name] MRN: [id number] Date of Birth: [birth year]  Today's Date: 10/09/2011 Time:  -    GENERAL:  66 yr old admitted with sudden right hemiparesis and CT revealed left hemorrhagic CVA.  Bedside swallow assessment with indications of pharyngeal dysphagia with MBS recommended.  Past Medical History:  Past Medical History  Diagnosis Date  . Gout   . Hypertension   . Osteoarthritis     neck  . Impaired fasting glucose   . Alcoholism     still drinking wine as of Feb 2013  . Gastric ulcer 2011    egd at [facility] Aug 2011.  . Duodenal ulcer 2011   Past Surgical History:  Past Surgical History  Procedure Date  . Cystogram 03/08   HPI:   (See bedside swallow for history and present illness)     Recommendation/Prognosis  Clinical Impression Dysphagia Diagnosis: Mild oral phase dysphagia;Mild pharyngeal phase dysphagia Clinical impression: Pt. exhibited a mild motor oral and mild sensori-motor pharyngeal dysphagia.  Impairments include delayed swallow, reduced tongue base retraction, and decreased laryngeal elevation resulting in pharyngeal residue and laryngeal penetration with thin barium (both flash and transient).  Chin tuck technique did not consistently prevent barium from entering laryngeal vestibule.  Weak lingual manipulation resulting in delayed mastication and propulsion to posterior oral cavity.  Dys 3 diet texture and nectar thick liquids recommended.  Swallow Evaluation Recommendations Solid Consistency: Dysphagia 3 (Mechanical soft) Liquid Consistency: Nectar  Liquid Administration via: Cup;No straw Medication Administration: Whole meds with puree Supervision: Full supervision/cueing for compensatory strategies Compensations: Slow rate;Small sips/bites;Check for anterior loss Postural Changes and/or Swallow Maneuvers: Seated upright 90 degrees Follow up Recommendations:   (TBD) Prognosis Prognosis for Safe Diet Advancement: Good Individuals Consulted Consulted and Agree with Results and Recommendations: Patient;Family member/caregiver  SLP Assessment/Plan   SLP Goals  SLP Swallowing Goals Patient will consume recommended diet without observed clinical signs of aspiration with: Minimal assistance Patient will utilize recommended strategies during swallow to increase swallowing safety with: Minimal assistance  General:  HPI:  (See bedside swallow for history and present illness) Type of Study: Initial MBS Diet Prior to this Study: NPO Respiratory Status: Room air Behavior/Cognition: Alert;Cooperative;Pleasant mood Oral Cavity - Dentition: Adequate natural dentition Oral Motor / Sensory Function: Impaired - see Bedside swallow eval Vision: Functional for self-feeding Patient Positioning: Upright in chair Baseline Vocal Quality: Clear;Low vocal intensity Volitional Cough: Strong Volitional Swallow: Able to elicit Anatomy: Within functional limits Pharyngeal Secretions: Not observed secondary MBS  Oral Phase Oral Preparation/Oral Phase Oral Phase: Impaired Oral - Solids Oral - Regular: Weak lingual manipulation;Delayed oral transit Pharyngeal Phase  Pharyngeal Phase Pharyngeal Phase: Impaired Pharyngeal - Pudding Pharyngeal - Pudding Teaspoon: Reduced tongue base retraction;Pharyngeal residue - valleculae;Pharyngeal residue - pyriform;Reduced laryngeal elevation Pharyngeal - Nectar Pharyngeal - Nectar Teaspoon: Reduced laryngeal elevation;Reduced tongue base retraction;Pharyngeal residue - valleculae;Pharyngeal residue - pyriform;Delayed swallow initiation;Premature spillage to pyriform (MILD RESIDUE) Pharyngeal - Nectar Cup: Reduced tongue base retraction;Reduced laryngeal elevation;Pharyngeal residue - valleculae;Pharyngeal residue - pyriform (MILD) Pharyngeal - Thin Pharyngeal - Thin Teaspoon: Delayed swallow initiation;Premature spillage to  pyriform;Penetration/Aspiration during swallow;Reduced laryngeal elevation;Reduced tongue base retraction;Pharyngeal residue - valleculae;Pharyngeal residue - pyriform Penetration/Aspiration details (thin teaspoon): Material enters airway, remains ABOVE vocal cords then ejected out;Material enters airway, remains ABOVE vocal cords and not ejected out Pharyngeal - Thin Cup: Delayed swallow initiation;Premature spillage to pyriform;Reduced laryngeal elevation;Reduced tongue base retraction;Pharyngeal residue - valleculae;Pharyngeal residue - pyriform;Penetration/Aspiration  during swallow (CHIN TUCK NOT CONSISTENTY EFFECTIVE) Penetration/Aspiration details (thin cup): Material enters airway, remains ABOVE vocal cords and not ejected out;Material enters airway, remains ABOVE vocal cords then ejected out Pharyngeal - Solids Pharyngeal - Puree: Reduced laryngeal elevation;Reduced tongue base retraction;Pharyngeal residue - valleculae;Pharyngeal residue - pyriform Cervical Esophageal Phase  Cervical Esophageal Phase Cervical Esophageal Phase: Impaired Cervical Esophageal Phase - Comment Cervical Esophageal Comment:  (PT. WITH PROMINENT CP WITH MIN RESIDUE)    Breck Coons McKinney.Ed [employer] [phone]  10/09/2011

## 2011-10-09 NOTE — Consult Note (Addendum)
Name: Carl [name] MRN: [id number] DOB: 1946-08-26    LOS: 1 Requesting MD: Stroke Team  PCCM CONSULT NOTE  History of Present Illness: 66 yo wm with hx of ETOH abuse who presented 2/5 with new onset R hemiparesis and bloody emesis. He was noted to be aphasic and CT head revealed L thalamic/basal ganglia intraparenchymal hemorrhage, with extension into the ventricular system. He requires vasoactive drip for HTN and was admitted to Neuro ICU on Stroke Team service. PCCM asked to consult for critical illness.  Lines / Drains:   Cultures:   Antibiotics:   Tests / Events: CT head 2/5 -Stable appearance of large left thalamic hemorrhagic infarct with<BR>intra-articular extension of hemorrhage.No significant interval change Octreotide drip 2/5>>2/6  Subjective:  No distress  Vital Signs: Temp:  [97.9 F (36.6 C)-98.9 F (37.2 C)] 98.9 F (37.2 C) (02/06 0338) Pulse Rate:  [63-100] 89  (02/06 1000) Resp:  [15-28] 23  (02/06 1000) BP: (117-183)/(68-102) 117/81 mmHg (02/06 1000) SpO2:  [91 %-98 %] 93 % (02/06 1000) I/O last 3 completed shifts: In: 4343 [I.V.:4043; IV Piggyback:300] Out: 2975 [Urine:2975]  Physical Examination: General:  NAD x anxious Neuro: rt facial droop. Rt hemiparesis.  Partial expressive aphasia, no changes HEENT: no jvd Cardiovascular: hsr rrr Lungs: CTA Abdomen:  Obese + bs Musculoskeletal: intact Skin:  warm  Ventilator settings:    Labs and Imaging:  Ct Head Wo Contrast  10/08/2011  *RADIOLOGY REPORT*  Clinical Data: Hemorrhage, follow-up  CT HEAD WITHOUT CONTRAST  Technique:  Contiguous axial images were obtained from the base of the skull through the vertex without contrast.  Comparison: Exam of 1019 hours compared to 10/08/2011 at 0056 hours  Findings: Generalized atrophy. Again identified large left thalamic hemorrhagic infarct, 3.1 x 2.1 cm, grossly stable. Intraventricular extension of hemorrhage with high attenuation blood seen within the  lateral ventricles, third ventricle and fourth ventricle. Minimal mass effect. Small vessel chronic ischemic changes of deep cerebral white matter. No additional areas of hemorrhage or infarction identified. No midline shift. Atherosclerotic calcifications of the skull base. Visualized paranasal sinuses and mastoid air cells clear. Bones demineralized.  IMPRESSION: Stable appearance of large left thalamic hemorrhagic infarct with intra-articular extension of hemorrhage. No significant interval change.  Original Report Authenticated By: Lollie Marrow, M.D.   Ct Head Wo Contrast  10/08/2011  *RADIOLOGY REPORT*  Clinical Data: Weakness, vomiting, code stroke.  CT HEAD WITHOUT CONTRAST  Technique:  Contiguous axial images were obtained from the base of the skull through the vertex without contrast.  Comparison: None.  Findings: Intraparenchymal hemorrhage centered within the left thalamus/basal ganglia.  There is no significant mass effect at this time as the hemorrhage is decompressing into the ventricular system. No overt hydrocephalus at this time.  No midline shift or herniation.  Periventricular and subcortical white matter hypodensities are most in keeping with chronic microangiopathic change.The visualized paranasal sinuses and mastoid air cells are predominately clear.  IMPRESSION: Left thalamic/basal ganglia intraparenchymal hemorrhage, with extension into the ventricular system.  Critical Value/emergent results were called by telephone at the time of interpretation on 10/08/2011  at 1/5 a.m.  to  Dr. Rubin Payor, who verbally acknowledged these results.  Original Report Authenticated By: Waneta Martins, M.D.   Dg Chest Port 1 View  10/08/2011  *RADIOLOGY REPORT*  Clinical Data: Stroke, vomiting  PORTABLE CHEST - 1 VIEW  Comparison: None.  Findings: Low lung volumes.  Mild perihilar and bibasilar interstitial edema or infiltrates.  There is patchy left  retrocardiac consolidation / atelectasis.  No  definite effusion. Heart size upper limits normal for technique.  Regional bones unremarkable.  IMPRESSION:  1.  Low volumes with mild bilateral edema or infiltrates.  Original Report Authenticated By: Osa Craver, M.D.    Lab 10/08/11 0046 10/08/11 0044  NA 139 137  K 3.2* 3.0*  CL 104 100  CO2 -- 22  BUN 18 17  CREATININE 0.90 0.84  GLUCOSE 173* 171*    Lab 10/08/11 1754 10/08/11 1245 10/08/11 0046 10/08/11 0044  HGB 14.6 14.0 14.3 --  HCT 41.8 40.0 42.0 --  WBC 6.7 6.3 -- 8.5  PLT 145* 156 -- 146*   ABG No results found for this basename: phart,  pco2,  pco2art,  po2,  po2art,  hco3,  tco2,  acidbasedef,  o2sat    Assessment and Plan: Stroke  -per Neuro -if worsens may need intubation and interventions, no need at this time Would NOT offer nimv , GI bled asp risk etc pcxr follow up for ATX IS addition CT head again today, I have reviewed prior Would assess LFT with etoh   GI bleed: -per GI No active bleeding noted ppi to bid Npo Swallow done, needs further eval Cbc in am  Avoid phlebotomy further Will assess lytes   HTN: malignant, emergent, urgent -Off nicardipine infusion -consider MAP goal 25% reduction from highest MAp goal on admission Glad octreotide off tele  ETOH abuse: -add folic acid/thiamine  Prn ativan Would NOT use CIWA in icu with his neuro condition  Best practices / Disposition: -->ICU status under Stroke team -->full code -->PAS for DVT Px -->Protonix for GI Px -->diet per Neuro  Mcarthur Rossetti. Tyson Alias, MD, FACP Pgr: [phone] [facility]

## 2011-10-09 NOTE — Progress Notes (Signed)
Stroke Team Progress Note  HISTORY Carl [name] is an 66 y.o. male white presenting in the ER with sudden onset right hemiparesis and hematemesis. The patient is encephalopathic so most of the history is coming from his family. [name] tells me that the patient was found on the floor by his son earlier tonight around 10:45 pm. The patient had bowel incontinence and had vomited his dinner. [name] and wife tried to put the patient in the shower at which point he started vomiting blood. The family then called 911 and the patient was brought to the ER. The way to the ER and EMS vehicle the patient continued to have bloody vomit. His blood pressures were elevated in the ER and he was aphasic and able to are only a few words. He had right-sided facial droop as well as the right arm and leg weakness. The patient was unable at that point to hold his arm up. During the course of history taking and examination the patient Improved in strength and speech.  according to the patient's wife and son comment the patient takes about 10-20 Tylenol that day for chronic neck pain. He also had bloody vomit last year and was found to have 2 ulcers. However comment that time he did not have any asymmetric weakness normal with the vomiting is excessive.  The patient is a recovering alcoholic and has been going to [employer] The wife is not sure, however, if he has stopped drinking alcohol altogether  SUBJECTIVE No family is at the bedside. He is at risk for neuro worsening. Pt remains NPO. Swallow screen not yet done.  OBJECTIVE Filed Vitals:   10/09/11 0530 10/09/11 0600 10/09/11 0630 10/09/11 0700  BP: 146/75 148/80 151/68 153/77  Pulse: 84 77 81 83  Temp:      TempSrc:      Resp: 20 19  25   Height:      Weight:      SpO2: 92% 93% 91% 93%    CBG (last 3) No results found for this basename: GLUCAP:3 in the last 72 hours Intake/Output from previous day: 02/05 0701 - 02/06 0700 In: 3655.5  [I.V.:3355.5; IV Piggyback:300] Out: 2975 [Urine:2975]  IV Fluid Intake:      . niCARDipine 4 mg/hr (10/09/11 0700)  . octreotide (SANDOSTATIN) infusion 25 mcg/hr (10/09/11 0700)  . pantoprozole (PROTONIX) infusion 8 mg/hr (10/09/11 0700)  . sodium chloride 0.9 % 1,000 mL with potassium chloride 20 mEq infusion 75 mL/hr at 10/08/11 0807  . DISCONTD: sodium chloride Stopped (10/08/11 0807)   Medications     . potassium chloride  10 mEq Intravenous Q1 Hr x 3  . potassium chloride  10 mEq Intravenous Once  . DISCONTD: pantoprazole (PROTONIX) IV  40 mg Intravenous QHS  . DISCONTD: senna-docusate  1 tablet Oral BID  PRN acetaminophen, acetaminophen, labetalol, ondansetron (ZOFRAN) IV  Diet:  NPO  Activity:  Bedrest  DVT Prophylaxis:  SCDs   Significant Diagnostic Studies: CBC    Component Value Date/Time   WBC 6.7 10/08/2011 1754   RBC 4.44 10/08/2011 1754   HGB 14.6 10/08/2011 1754   HCT 41.8 10/08/2011 1754   PLT 145* 10/08/2011 1754   MCV 94.1 10/08/2011 1754   MCH 32.9 10/08/2011 1754   MCHC 34.9 10/08/2011 1754   RDW 13.7 10/08/2011 1754   LYMPHSABS 0.5* 10/08/2011 0044   MONOABS 0.5 10/08/2011 0044   EOSABS 0.0 10/08/2011 0044   BASOSABS 0.0 10/08/2011 0044  CMP    Component Value Date/Time   NA 139 10/08/2011 0046   K 3.2* 10/08/2011 0046   CL 104 10/08/2011 0046   CO2 22 10/08/2011 0044   GLUCOSE 173* 10/08/2011 0046   BUN 18 10/08/2011 0046   CREATININE 0.90 10/08/2011 0046   CALCIUM 9.3 10/08/2011 0044   PROT 7.8 10/08/2011 0044   ALBUMIN 4.1 10/08/2011 0044   AST 19 10/08/2011 0044   ALT 17 10/08/2011 0044   ALKPHOS 37* 10/08/2011 0044   BILITOT 0.6 10/08/2011 0044   GFRNONAA 90* 10/08/2011 0044   GFRAA >90 10/08/2011 0044   COAGS Lab Results  Component Value Date   INR 0.98 10/08/2011   Lipid Panel    Component Value Date/Time   CHOL 211* 01/15/2007 1027   TRIG 303* 01/15/2007 1027   HDL 38.3* 01/15/2007 1027   CHOLHDL 5.5 CALC 01/15/2007 1027   VLDL 61* 01/15/2007 1027   HgbA1C  Lab Results    Component Value Date   HGBA1C 5.6 11/04/2007   Urine Drug Screen     Component Value Date/Time   LABOPIA NONE DETECTED 10/08/2011 0245   COCAINSCRNUR NONE DETECTED 10/08/2011 0245   LABBENZ NONE DETECTED 10/08/2011 0245   AMPHETMU NONE DETECTED 10/08/2011 0245   THCU NONE DETECTED 10/08/2011 0245   LABBARB NONE DETECTED 10/08/2011 0245    Alcohol Level No results found for this basename: eth    CT of the brain    10/08/2011   Stable appearance of large left thalamic hemorrhagic infarct with intra-articular extension of hemorrhage. No significant interval change.   10/08/2011 Left thalamic/basal ganglia intraparenchymal hemorrhage, with extension into the ventricular system.   MRI of the brain    MRA of the brain    2D Echocardiogram    Carotid Doppler    CXR   10/08/2011  1.  Low volumes with mild bilateral edema or infiltrates.    EKG  sinus rhythm   Physical Exam    The patient is sleepy, but can be aroused.  The patient has a dysarthric speech pattern, no aphasia.  Respiratory examination is clear.  Cardiovascular examination shows a regular rate and rhythm, no obvious murmurs or rubs.  Abdomen is obese, positive bowel sounds, nontender.  Extremities are without significant edema.  The patient appears to have a depression of the right nasolabial fold. Extraocular movements are full, visual fields are full.  The patient has mild weakness of the right arm greater than right leg, drift of the right upper extremity. Patient has normal strength of the left side.  The patient has some clumsiness with finger-nose-finger with the right arm, normal in the left. The patient is able to perform toe to finger bilaterally.  Deep tendon reflexes are depressed but symmetric.  The patient was not ambulated.  ASSESSMENT [name] is a 66 y.o. 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 hemorrhage secondary to hypertension. Discussed  case with Dr. Gerlene Fee who wants to know if he decompensates.   -dysphagia, right hemiparesis and aphasia secondary to hemorrhage -GIB secondary to ulcers, GI consulted, treating medically -hypokalemia, 3.2 this am. Added to IVF yest with 3 runs -dyslipidemia -etoh abuse -hypertension, on iv cardene -chronic pain with excessive tylenol use  Hospital day # 1  TREATMENT/PLAN -ST swallow eval today -SBP goal < 160 -CT this am -check carotid doppler and 2D echo -ativan prn agitation -add thiamine, folic acid injection -bmet and cbc in am,  still pending today  Joaquin Music, ANP-BC, GNP-BC [facility] Pager: [phone] 10/09/2011 7:36 AM  Dr. Lesia Sago has personally reviewed chart, pertinent data, examined the patient and developed the plan of care.  Lesly Dukes

## 2011-10-09 NOTE — Progress Notes (Signed)
PT Cancellation Note  Evaluation secondary to pt still on bedrest. Stroke MD contacted, would like to wait for CT results prior to ambulation.   Thanks,  Dahlia Client (Beverely Pace) Carleene Mains PT, DPT Acute Rehabilitation [phone]

## 2011-10-09 NOTE — Progress Notes (Signed)
VASCULAR LAB PRELIMINARY  PRELIMINARY  PRELIMINARY  PRELIMINARY  Carotid duplex completed.    Preliminary report:  Bilateral:  No evidence of hemodynamically significant internal carotid artery stenosis.   Vertebral artery flow is antegrade.      Terance Hart, RVT 10/09/2011, 3:55 PM

## 2011-10-09 NOTE — Progress Notes (Signed)
Patient seen, examined,  and I agree with the above documentation, including the assessment and plan. No evidence for ongoing GI bleed, and with normal H/H any bleeding was minimal.   Given he remains on nicardipine gtt, remains in ICU, and is without evidence of overt bleeding, I do not see immediate need for EGD. Given hx and presentation, EGD before d/c is reasonable (preferably after vasoactive gtts are off) Agree with d/c pantoprazole and octreotide gtts, in favor of BID IV PPI Watch for s/s of ETOH withdrawal.

## 2011-10-09 NOTE — Progress Notes (Signed)
Speech Language/Pathology  Bedside swallow completed with  Evidence of penetration and/or aspiration.  Full assessment will be doccumented  REC:  NPO, MBS, Pt. For CT today. RN, [place] will see what time CT will be so perhaps time can be coordinated with MBS.  Breck Coons [place].Ed [employer] [phone]  10/09/2011

## 2011-10-09 NOTE — Progress Notes (Signed)
OT Cancellation Note  Treatment cancelled today due to:   Pt currently with bedrest orders. Please increase activity tolerance as appropriate.    Zyrell, Carmean [place]   OTR/L Pager: [phone] Office: [phone] .

## 2011-10-09 NOTE — Progress Notes (Signed)
     [facility] Daily Rounding Note 10/09/2011, 8:36 AM  SUBJECTIVE: Note stable Hgb/crit.  Note neuro plans for swallow eval today.   Repeat head CT ordered.   No recorded stools.  Still on drips of nicardipine, octreotide and Protonix.   Hungry.  No belly pain.  OBJECTIVE: General: Looks unwell.  Alert.   Vital signs in last 24 hours: Temp:  [97.9 F (36.6 C)-98.9 F (37.2 C)] 98.9 F (37.2 C) (02/06 0338) Pulse Rate:  [63-100] 92  (02/06 0800) Resp:  [15-28] 24  (02/06 0800) BP: (125-183)/(68-102) 156/87 mmHg (02/06 0800) SpO2:  [91 %-99 %] 93 % (02/06 0800)    Heart: RRR  Chest: Clear  Abdomen: Soft, NT, ND.  Active BS  Extremities: no pedal edema. Neuro/Psych:  Alert.  Moves all 4s.  Restless leg type leg movement  Intake/Output from previous day: 02/05 0701 - 02/06 0700 In: 3655.5 [I.V.:3355.5; IV Piggyback:300] Out: 2975 [Urine:2975]  Intake/Output this shift:    Lab Results:  Basename 10/08/11 1754 10/08/11 1245 10/08/11 0046 10/08/11 0044  WBC 6.7 6.3 -- 8.5  HGB 14.6 14.0 14.3 --  HCT 41.8 40.0 42.0 --  PLT 145* 156 -- 146*   BMET  Basename 10/08/11 0046 10/08/11 0044  NA 139 137  K 3.2* 3.0*  CL 104 100  CO2 -- 22  GLUCOSE 173* 171*  BUN 18 17  CREATININE 0.90 0.84  CALCIUM -- 9.3   LFT  Basename 10/08/11 0044  PROT 7.8  ALBUMIN 4.1  AST 19  ALT 17  ALKPHOS 37*  BILITOT 0.6  BILIDIR --  IBILI --   PT/INR  Basename 10/08/11 0044  LABPROT 13.2  INR 0.98   NDREW Burnis Kingfisher, M.D.   Dg Chest Port 1 View  10/08/2011  *RADIOLOGY REPORT*  Clinical Data: Stroke, vomiting  PORTABLE CHEST - 1 VIEW  Comparison: None.  Findings: Low lung volumes.  Mild perihilar and bibasilar interstitial edema or infiltrates.  There is patchy left retrocardiac consolidation / atelectasis.  No definite effusion. Heart size upper limits normal for technique.  Regional bones unremarkable.  IMPRESSION:  1.  Low volumes with mild bilateral edema or infiltrates.   Original Report Authenticated By: Osa Craver, M.D.    ASSESMENT: 1. Acute hemetemesis, r/o recurre ulcer.  Less likely esoph variceal bleed.  H & H are stable.  No need for emergent EGD 2. 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  3. Chronic neck pain.  4. Alcoholism. Though platelets are low, do not see evidence of cirrhosis, though he is at risk for this.  5. Hypertensive crisis. Improved on Nicardipine drip.  6. Diabetic range glucose.     PLAN: 1.  D/C the octreotide and Protonix drips.  Add BID IV Protonix.  EGD when off Nicardipine and "out of the woods" from neuro standpoint.      LOS: 1 day   Jennye Moccasin  10/09/2011, 8:36 AM Pager: [phone]

## 2011-10-09 NOTE — Plan of Care (Signed)
Problem: Phase II Progression Outcomes Goal: Tolerating diet/TF at goal rate Completed swallow assessment. See full report for details  Rec:  NPO.  MBS scheduled today at [address] [place].Ed [employer] [phone]  10/09/2011

## 2011-10-09 NOTE — Progress Notes (Signed)
eLink Physician-Brief Progress Note Patient Name: Carl [name] DOB: 02/25/1946 MRN: [id number]  Date of Service  10/09/2011   HPI/Events of Note   Lab 10/09/11 0940 10/08/11 0046 10/08/11 0044  K 3.4* 3.2* 3.0*   Is getting kcl in maintenance fluid   eICU Interventions  Recheck bmet, phos, mag now   Intervention Category Intermediate Interventions: Electrolyte abnormality - evaluation and management;Diagnostic test evaluation  Jaimee Corum 10/09/2011, 8:52 PM

## 2011-10-09 NOTE — Progress Notes (Signed)
  Echocardiogram 2D Echocardiogram has been performed.  Cathie Beams Deneen 10/09/2011, 11:09 AM

## 2011-10-10 ENCOUNTER — Inpatient Hospital Stay (HOSPITAL_COMMUNITY): Payer: Medicare Other

## 2011-10-10 DIAGNOSIS — G8110 Spastic hemiplegia affecting unspecified side: Secondary | ICD-10-CM

## 2011-10-10 DIAGNOSIS — I619 Nontraumatic intracerebral hemorrhage, unspecified: Secondary | ICD-10-CM

## 2011-10-10 LAB — CBC
MCV: 95.7 fL (ref 78.0–100.0)
Platelets: 153 10*3/uL (ref 150–400)
RBC: 4.66 MIL/uL (ref 4.22–5.81)
WBC: 9.1 10*3/uL (ref 4.0–10.5)

## 2011-10-10 LAB — COMPREHENSIVE METABOLIC PANEL
ALT: 11 U/L (ref 0–53)
Calcium: 9.5 mg/dL (ref 8.4–10.5)
GFR calc Af Amer: >90 mL/min (ref >90)
Glucose, Bld: 133 mg/dL — ABNORMAL HIGH (ref 70–99)
Sodium: 141 mEq/L (ref 135–145)
Total Protein: 7.3 g/dL (ref 6.0–8.3)

## 2011-10-10 LAB — BASIC METABOLIC PANEL
CO2: 24 mEq/L (ref 19–32)
Chloride: 103 mEq/L (ref 96–112)
Sodium: 140 mEq/L (ref 135–145)

## 2011-10-10 MED ORDER — POTASSIUM CHLORIDE 20 MEQ/15ML (10%) PO LIQD
20.0000 meq | Freq: Two times a day (BID) | ORAL | Status: DC
  Administered 2011-10-10 – 2011-10-11 (×4): 20 meq via ORAL
  Filled 2011-10-10 (×8): qty 15

## 2011-10-10 MED ORDER — ATENOLOL 100 MG PO TABS
100.0000 mg | ORAL_TABLET | Freq: Two times a day (BID) | ORAL | Status: DC
  Administered 2011-10-10 (×2): 100 mg via ORAL
  Filled 2011-10-10 (×4): qty 1

## 2011-10-10 MED ORDER — HYDRALAZINE HCL 50 MG PO TABS
100.0000 mg | ORAL_TABLET | Freq: Three times a day (TID) | ORAL | Status: DC
  Administered 2011-10-10 – 2011-10-11 (×6): 100 mg via ORAL
  Filled 2011-10-10 (×12): qty 2

## 2011-10-10 MED ORDER — CLONIDINE HCL 0.1 MG PO TABS
0.1000 mg | ORAL_TABLET | Freq: Two times a day (BID) | ORAL | Status: DC
  Administered 2011-10-10 – 2011-10-11 (×3): 0.1 mg via ORAL
  Filled 2011-10-10 (×8): qty 1

## 2011-10-10 MED ORDER — LOSARTAN POTASSIUM 50 MG PO TABS
50.0000 mg | ORAL_TABLET | Freq: Every day | ORAL | Status: DC
  Administered 2011-10-10 – 2011-10-11 (×2): 50 mg via ORAL
  Filled 2011-10-10 (×4): qty 1

## 2011-10-10 MED ORDER — DOXAZOSIN MESYLATE 8 MG PO TABS
8.0000 mg | ORAL_TABLET | Freq: Every day | ORAL | Status: DC
  Administered 2011-10-10 – 2011-10-11 (×2): 8 mg via ORAL
  Filled 2011-10-10 (×4): qty 1

## 2011-10-10 NOTE — Plan of Care (Signed)
Problem: Phase III Progression Outcomes Goal: Other Phase III Outcomes/Goals Speech-language-cognitive assessment completed.  See report for full details.  Recommend inpatient rehab.  Breck Coons [place].Ed [employer] [phone]  10/10/2011

## 2011-10-10 NOTE — Progress Notes (Signed)
Stroke Team Progress Note  HISTORY Carl [name] is an 66 y.o. male white presenting in the ER with sudden onset right hemiparesis and hematemesis. The patient is encephalopathic so most of the history is coming from his family. [name] tells me that the patient was found on the floor by his son earlier tonight around 10:45 pm. The patient had bowel incontinence and had vomited his dinner. [name] and wife tried to put the patient in the shower at which point he started vomiting blood. The family then called 911 and the patient was brought to the ER. The way to the ER and EMS vehicle the patient continued to have bloody vomit. His blood pressures were elevated in the ER and he was aphasic and able to are only a few words. He had right-sided facial droop as well as the right arm and leg weakness. The patient was unable at that point to hold his arm up. During the course of history taking and examination the patient Improved in strength and speech.  according to the patient's wife and son comment the patient takes about 10-20 Tylenol that day for chronic neck pain. He also had bloody vomit last year and was found to have 2 ulcers. However comment that time he did not have any asymmetric weakness normal with the vomiting is excessive.  The patient is a recovering alcoholic and has been going to [employer] The wife is not sure, however, if he has stopped drinking alcohol altogether  SUBJECTIVE No complaints. Wife at bedside. Taking pos. Has not had breakfast yet today.  OBJECTIVE Filed Vitals:   10/10/11 0400 10/10/11 0500 10/10/11 0515 10/10/11 0600  BP: 142/69 179/101 132/87 146/80  Pulse: 84 47 84 89  Temp:      TempSrc:      Resp: 19 20 23 25   Height:      Weight:      SpO2: 99% 96% 96% 91%    CBG (last 3) No results found for this basename: GLUCAP:3 in the last 72 hours Intake/Output from previous day: 02/06 0701 - 02/07 0700 In: 3338.3 [I.V.:3338.3] Out: 2000  [Urine:2000]  IV Fluid Intake:     . niCARDipine 6 mg/hr (10/10/11 0722)  . DISCONTD: octreotide (SANDOSTATIN) infusion Stopped (10/09/11 0850)  . DISCONTD: pantoprozole (PROTONIX) infusion Stopped (10/09/11 0850)  . DISCONTD: sodium chloride 0.9 % 1,000 mL with potassium chloride 20 mEq infusion 75 mL/hr at 10/08/11 0807   Medications    . folic acid  1 mg Intravenous Daily  . pantoprazole (PROTONIX) IV  40 mg Intravenous Q12H  . sodium chloride 0.9 % 1,000 mL with multivitamins adult (MVI -12) 10 mL, potassium chloride 20 mEq infusion   Intravenous Q24H  . sodium chloride 0.9 % 1,000 mL with potassium chloride 20 mEq infusion   Intravenous Q24H  . thiamine  100 mg Intravenous Daily  . DISCONTD: multivitamin (MVI) IV infusion (LR or D5LR)  10 mL Intravenous Q24H  PRN acetaminophen, acetaminophen, food thickener, labetalol, LORazepam, ondansetron (ZOFRAN) IV  Diet:  Dysphagia 3 nectar thick liquids Activity:  Bedrest  DVT Prophylaxis:  SCDs   Significant Diagnostic Studies: CBC    Component Value Date/Time   WBC 6.7 10/08/2011 1754   RBC 4.44 10/08/2011 1754   HGB 14.6 10/08/2011 1754   HCT 41.8 10/08/2011 1754   PLT 145* 10/08/2011 1754   MCV 94.1 10/08/2011 1754   MCH 32.9 10/08/2011 1754   MCHC 34.9 10/08/2011 1754  RDW 13.7 10/08/2011 1754   LYMPHSABS 0.5* 10/08/2011 0044   MONOABS 0.5 10/08/2011 0044   EOSABS 0.0 10/08/2011 0044   BASOSABS 0.0 10/08/2011 0044   CMP    Component Value Date/Time   NA 141 10/10/2011 0520   K 3.3* 10/10/2011 0520   CL 104 10/10/2011 0520   CO2 25 10/10/2011 0520   GLUCOSE 133* 10/10/2011 0520   BUN 14 10/10/2011 0520   CREATININE 0.83 10/10/2011 0520   CALCIUM 9.5 10/10/2011 0520   PROT 7.3 10/10/2011 0520   ALBUMIN 3.6 10/10/2011 0520   AST 18 10/10/2011 0520   ALT 11 10/10/2011 0520   ALKPHOS 45 10/10/2011 0520   BILITOT 0.9 10/10/2011 0520   GFRNONAA >90 10/10/2011 0520   GFRAA >90 10/10/2011 0520   COAGS Lab Results  Component Value Date   INR 0.98 10/08/2011   Lipid  Panel    Component Value Date/Time   CHOL 211* 01/15/2007 1027   TRIG 303* 01/15/2007 1027   HDL 38.3* 01/15/2007 1027   CHOLHDL 5.5 CALC 01/15/2007 1027   VLDL 61* 01/15/2007 1027   HgbA1C  Lab Results  Component Value Date   HGBA1C 5.6 11/04/2007   Urine Drug Screen     Component Value Date/Time   LABOPIA NONE DETECTED 10/08/2011 0245   COCAINSCRNUR NONE DETECTED 10/08/2011 0245   LABBENZ NONE DETECTED 10/08/2011 0245   AMPHETMU NONE DETECTED 10/08/2011 0245   THCU NONE DETECTED 10/08/2011 0245   LABBARB NONE DETECTED 10/08/2011 0245    Alcohol Level No results found for this basename: eth  Ct Head Wo Contrast  CT of the brain    10/09/2011  No change in the left thalamic hematoma with intraventricular penetration.  Axial measurement of 30 x 18 mm.  10/08/2011   Stable appearance of large left thalamic hemorrhagic infarct with intra-articular extension of hemorrhage. No significant interval change.   10/08/2011 Left thalamic/basal ganglia intraparenchymal hemorrhage, with extension into the ventricular system.   MRI of the brain    MRA of the brain    2D Echocardiogram  EF 55-60% with no source of embolus.   Carotid Doppler  No internal carotid artery stenosis bilaterally. Vertebrals with antegrade flow bilaterally.   CXR   10/08/2011  1.  Low volumes with mild bilateral edema or infiltrates.    EKG  sinus rhythm   Physical Exam    The patient is sleepy, but can be aroused.  The patient has a dysarthric speech pattern, no aphasia.  Respiratory examination is clear.  Cardiovascular examination shows a regular rate and rhythm, no obvious murmurs or rubs.  Abdomen is obese, positive bowel sounds, nontender.  Extremities are without significant edema.  The patient appears to have a depression of the right nasolabial fold. Extraocular movements are full, visual fields are full.  The patient has mild weakness of the right arm greater than right leg, drift of the right upper extremity.  Patient has normal strength of the left side.  The patient has some clumsiness with finger-nose-finger with the right arm, normal in the left. The patient is able to perform toe to finger bilaterally.  Deep tendon reflexes are depressed but symmetric.  The patient was not ambulated.  ASSESSMENT Carl [name] is a 66 y.o. 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 hemorrhage secondary to hypertension. Discussed case with Dr. Gerlene Fee who wants to know if he decompensates.   -dysphagia, right hemiparesis and aphasia secondary  to hemorrhage -GIB secondary to ulcers, GI consulted, treating medically. Drips stopped. Recommend endo prior to discharge -hypokalemia, 3.3 this am -dyslipidemia -etoh abuse -hypertension, on iv cardene -chronic pain with excessive tylenol use  Hospital day # 2  TREATMENT/PLAN -resume home oral BP meds, wean cardene -OOB. Therapy evals. -Kcl replacement  SHARON BIBY, AVNP, ANP-BC, GNP-BC [facility] Pager: [phone] 10/10/2011 7:45 AM  Dr. Lesia Sago has personally reviewed chart, pertinent data, examined the patient and developed the plan of care.  Ledora Bottcher KEITH

## 2011-10-10 NOTE — Evaluation (Signed)
Speech Language Pathology Evaluation Patient Details Name: Carl [name] MRN: [id number] DOB: 02/08/1946 Today's Date: 10/10/2011  GENERAL: 66 yr old admitted with acute left CVA. Problem List:  Patient Active Problem List  Diagnoses  . GOUT  . ALCOHOL WITHDRAWAL  . ALCOHOL ABUSE  . ANXIETY, SITUATIONAL  . HYPERTENSION  . LUNG NODULE  . UNSPECIFIED PROSTATITIS  . CELLULITIS, HAND, LEFT  . CONTACT DERMATITIS&OTHER ECZEMA DUE TO PLANTS  . OSTEOARTHRITIS  . CHEST PAIN  . IMPAIRED FASTING GLUCOSE  . NECK SPRAIN AND STRAIN   Past Medical History:  Past Medical History  Diagnosis Date  . Gout   . Hypertension   . Osteoarthritis     neck  . Impaired fasting glucose   . Alcoholism     still drinking wine as of Feb 2013  . Gastric ulcer 2011    egd at [facility] Aug 2011.  . Duodenal ulcer 2011   Past Surgical History:  Past Surgical History  Procedure Date  . Cystogram 03/08    SLP Assessment/Plan/Recommendation Assessment Clinical Impression Statement: Pt. exhibited mild receptive and moderate expressive aphasia, and mild dysarthria characterized by decreased vocal intensity.  Comprehension impairments include difficulty understanding larger amounts of basic or moderately complex information.  Pt.'s verbal output is at the 1-2 word level or very short phrases including dysnomia, semantic paraphasia and perseverations.  Pt. would benefit from skilled ST on acute care and inpatient rehab   SLP Recommendation/Assessment: Patient will need skilled Speech Lanaguage Pathology Services in the acute care venue to address identified deficits Problem List: Auditory comprehension;Verbal expression;Attention;Thought organization;Memory (dysarthria) Therapy Diagnosis: Dysarthria;Aphasia;Cognitive Impairments Type of Aphasia: Broca's (expressive) Plan Speech Therapy Frequency: min 2x/week Duration: 2 weeks Treatment/Interventions: Language facilitation;Environmental  Economist;Internal/external aids;Functional tasks;Multimodal communcation approach;SLP instruction and feedback;Compensatory strategies;Patient/family education Potential to Achieve Goals: Good SLP Recommendations Recommendations for Other Services: Rehab consult Follow up Recommendations: Home health SLP Equipment Recommended: Defer to next venue Individuals Consulted Consulted and Agree with Results and Recommendations: Patient;Family member/caregiver  SLP Goals  SLP Goals Potential to Achieve Goals: Good Progress/Goals/Alternative treatment plan discussed with pt/caregiver and they: Agree SLP Goal #1: Pt. will demonstrate evidence of comprehension of info related to his medical status with mod cues via questioning SLP Goal #2: Pt. will utilize word finding strategies for expression of thoughts/needs (desciption, synonyms, etc) with min phonemic cues. SLP Goal #3: Pt. 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)  Cognition Overall Cognitive Status: Impaired Arousal/Alertness: Lethargic Orientation Level: Oriented to person;Oriented to place;Oriented to time Attention: Sustained Sustained Attention: Impaired Sustained Attention Impairment: Verbal basic;Functional basic Memory: Impaired Memory Impairment: Storage deficit;Retrieval deficit;Decreased recall of new information;Decreased short term memory Awareness: Impaired Awareness Impairment: Intellectual impairment;Emergent impairment;Anticipatory impairment Problem Solving: Impaired Problem Solving Impairment: Verbal basic;Functional basic Executive Function: Self Monitoring;Self Correcting Safety/Judgment:  (WILL FURTHER ASSESS IN THERAPY)  Comprehension Auditory  Comprehension Overall Auditory Comprehension: Impaired Yes/No Questions: Within Functional Limits (FOR BASIC BIOGRAPHICAL INFO) Commands: Within Functional Limits (FOR 1 & 2 STEP (INCLUDING BEFORE/AFTER) SUSPECT DIFF WITH 3 ) Conversation:  (DIFF W/ SOME SIMPLE CONVER IF INCREASED AMOUNT OF INFO) Interfering Components: Working [employer] fast streamer;Attention EffectiveTechniques: Extra processing time;Slowed speech;Stressing words;Visual/Gestural cues Visual Recognition/Discrimination Discrimination: Not tested Reading Comprehension Reading Status:  (TBA IN TREATMENT)  Expression Expression Primary Mode of Expression: Verbal  Verbal Expression Overall Verbal Expression: Impaired Initiation: Impaired Automatic Speech: Month of year (10/12 MONTHS WITHOUT CUES) Level of Generative/Spontaneous Verbalization: Word (SOMETIMES VERY SHORT PHRASES) Repetition:  (TBA) Naming: Impairment Responsive: Not tested Confrontation: Impaired (66%) Convergent: Not tested Divergent: Not tested Verbal Errors: Perseveration;Phonemic paraphasias (SOMETIMES AWARE OF VERBAL ERRORS) Pragmatics: No impairment Interfering Components: Attention Effective Techniques: Semantic cues;Phonemic cues Written Expression Dominant Hand: Right Written Expression: Exceptions to [facility]  Oral/Motor Oral Motor/Sensory Function Overall Oral Motor/Sensory Function:  (SEE BEDSIDE) Labial ROM: Reduced right Labial Strength: Reduced Lingual ROM: Reduced right Lingual Symmetry: Abnormal symmetry right Lingual Strength: Reduced Velum: Within Functional Limits Mandible: Within Functional Limits Motor Speech Overall Motor Speech: Impaired (WILL ASSESS FURTHER) Respiration: Impaired Level of Impairment: Sentence Phonation: Low vocal intensity Resonance: Within functional limits Articulation: Within functional limitis Intelligibility: Intelligibility reduced Motor Planning:  (WILL ASSESS FURTHER) Effective Techniques: Increased vocal  intensity  Breck Coons Carra Brindley M.Ed [employer] [phone]  10/10/2011

## 2011-10-10 NOTE — Progress Notes (Signed)
Physical Therapy Evaluation Patient Details Name: Carl [name] MRN: [id number] DOB: July 20, 1946 Today's Date: 10/10/2011  Problem List:  Patient Active Problem List  Diagnoses  . GOUT  . ALCOHOL WITHDRAWAL  . ALCOHOL ABUSE  . ANXIETY, SITUATIONAL  . HYPERTENSION  . LUNG NODULE  . UNSPECIFIED PROSTATITIS  . CELLULITIS, HAND, LEFT  . CONTACT DERMATITIS&OTHER ECZEMA DUE TO PLANTS  . OSTEOARTHRITIS  . CHEST PAIN  . IMPAIRED FASTING GLUCOSE  . NECK SPRAIN AND STRAIN    Past Medical History:  Past Medical History  Diagnosis Date  . Gout   . Hypertension   . Osteoarthritis     neck  . Impaired fasting glucose   . Alcoholism     still drinking wine as of Feb 2013  . Gastric ulcer 2011    egd at [facility] Aug 2011.  . Duodenal ulcer 2011   Past Surgical History:  Past Surgical History  Procedure Date  . 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 Pt presents with difficulty with motor planning as well as slight imbalance with dynamic stance. Pt will benefit from skilled PT in order to maximize functional mobility prior to d/c. PT Recommendation/Assessment: Patient will need skilled PT in the acute care venue PT Problem List: Decreased activity tolerance;Decreased balance;Decreased mobility;Decreased knowledge of precautions;Other (comment) (apraxia) PT Therapy Diagnosis : Abnormality of gait;Hemiplegia dominant side PT Plan PT Frequency: Min 4X/week PT Treatment/Interventions: DME instruction;Gait training;Stair training;Functional mobility training;Therapeutic activities;Therapeutic exercise;Balance training;Neuromuscular re-education;Patient/family education PT Recommendation Recommendations for Other Services: Rehab consult Follow Up Recommendations: Inpatient Rehab Equipment Recommended: Defer to next venue PT Goals  Acute  Rehab PT Goals PT Goal Formulation: With patient/family Time For Goal Achievement: 2 weeks Pt will go Supine/Side to Sit: with modified independence PT Goal: Supine/Side to Sit - Progress: Goal set today Pt will go Sit to Supine/Side: with modified independence PT Goal: Sit to Supine/Side - Progress: Goal set today Pt will go Sit to Stand: with supervision PT Goal: Sit to Stand - Progress: Goal set today Pt will go Stand to Sit: with supervision PT Goal: Stand to Sit - Progress: Goal set today Pt will Transfer Bed to Chair/Chair to Bed: with supervision PT Transfer Goal: Bed to Chair/Chair to Bed - Progress: Goal set today Pt will Ambulate: >150 feet;with least restrictive assistive device;with supervision PT Goal: Ambulate - Progress: Goal set today  PT Evaluation Precautions/Restrictions    Prior Functioning  Home Living Lives With: Spouse Receives Help From: Family Type of Home: House Home Layout: One level Home Access: Stairs to enter (also has Ramp- 1 step into kitchen with no rails) Entrance Stairs-Rails: Right Entrance Stairs-Number of Steps: 3 Bathroom Shower/Tub: Walk-in shower;Door [employer] Toilet: Standard Bathroom Accessibility: Yes How Accessible: Accessible via walker Home Adaptive Equipment: Crutches;Straight cane;Wheelchair - manual Prior Function Level of Independence: Independent with basic ADLs;Independent with transfers;Independent with gait Able to Take Stairs?: Yes Driving: Yes Vocation: Retired Comments: playing with dog (english springer spaniel 35 months old)- Bruce Cognition Cognition Arousal/Alertness: Awake/alert Overall Cognitive Status: History of cognitive impairments History of Cognitive Impairment: Appears at baseline functioning Orientation Level: Oriented to person;Oriented to place;Oriented to time Sensation/Coordination Sensation Light Touch: Appears Intact Coordination Gross Motor Movements are Fluid and Coordinated: Yes  (difficulty with motor intiation, mild apraxia) Finger Nose Finger Test: undershooting with increased time Heel Shin Test: difficulty with initiation to begin with  RLE although no discoordination Extremity Assessment RLE Assessment RLE Assessment: Within Functional Limits LLE Assessment LLE Assessment: Within Functional Limits Mobility (including Balance) Bed Mobility Bed Mobility: Yes Rolling Right: 5: Supervision;With rail Rolling Right Details (indicate cue type and reason): VC for hand placement Right Sidelying to Sit: 4: Min assist;HOB flat;With rails Right Sidelying to Sit Details (indicate cue type and reason): VC for hand placement and sequencing with RUE. Pt needed min assist to sit bearing weight through RUE Sitting - Scoot to Edge of Bed: 6: Modified independent (Device/Increase time) Transfers Transfers: Yes Sit to Stand: 4: Min assist;With upper extremity assist;From bed Sit to Stand Details (indicate cue type and reason): VC for sequencing and anterior translation. Pt with slight unsteadiness upon standing although he was able to self-correct with min assist to stabilize in standing Stand to Sit: 5: Supervision;With upper extremity assist;To chair/3-in-1 Stand to Sit Details: VC for hand placement and safety upon sitting. Stand Pivot Transfers: 4: Min assist Stand Pivot Transfer Details (indicate cue type and reason): Transfer from bed to chair with 2-3 steps. Min assist for stability Ambulation/Gait Ambulation/Gait: No  Posture/Postural Control Posture/Postural Control: No significant limitations Balance Balance Assessed: Yes Static Sitting Balance Static Sitting - Balance Support: Feet unsupported;Bilateral upper extremity supported Static Sitting - Level of Assistance: 7: Independent Static Sitting - Comment/# of Minutes: Pt sat at EOB for ~5 minutes while BP adjusting.  Static Standing Balance Static Standing - Balance Support: No upper extremity supported Static  Standing - Level of Assistance: 4: Min assist Static Standing - Comment/# of Minutes: Pt able to stand with SBA, although required min assist for stability during marching. Pt was able to perform mini squat with both left leg and right leg forward without any lack of balance Exercise    End of Session PT - End of Session Equipment Utilized During Treatment: Gait belt Activity Tolerance: Patient tolerated treatment well Patient left: in chair;with call bell in reach;with family/visitor present Nurse Communication: Mobility status for transfers General Behavior During Session: [facility] for tasks performed Cognition: Impaired, at baseline  Milana Kidney 10/10/2011, 10:02 AM  10/10/2011 Milana Kidney DPT PAGER: [phone] OFFICE: [phone]

## 2011-10-10 NOTE — Evaluation (Signed)
Occupational Therapy Evaluation Patient Details Name: ITAI BARBIAN MRN: [id number] DOB: 1945/10/10 Today's Date: 10/10/2011  Problem List:  Patient Active Problem List  Diagnoses  . GOUT  . ALCOHOL WITHDRAWAL  . ALCOHOL ABUSE  . ANXIETY, SITUATIONAL  . HYPERTENSION  . LUNG NODULE  . UNSPECIFIED PROSTATITIS  . CELLULITIS, HAND, LEFT  . CONTACT DERMATITIS&OTHER ECZEMA DUE TO PLANTS  . OSTEOARTHRITIS  . CHEST PAIN  . IMPAIRED FASTING GLUCOSE  . NECK SPRAIN AND STRAIN    Past Medical History:  Past Medical History  Diagnosis Date  . Gout   . Hypertension   . Osteoarthritis     neck  . Impaired fasting glucose   . Alcoholism     still drinking wine as of Feb 2013  . Gastric ulcer 2011    egd at [facility] Aug 2011.  . Duodenal ulcer 2011   Past Surgical History:  Past Surgical History  Procedure Date  . 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 Pt currently with apraxia with UE adl tasks. Pt with orthostaic BP initially with supine<>sit on EOB. Pt will require Mod (A) with UB Adls at this time due to motor planning deficits. Pt has support of wife and x2 sons at d/c home. Pt could benefit from skilled OT and CIR consult to reach Mod I level for d/c home. OT Recommendation/Assessment: Patient will need skilled OT in the acute care venue OT Problem List: Decreased strength;Decreased range of motion;Decreased activity tolerance;Impaired balance (sitting and/or standing);Impaired vision/perception;Decreased coordination;Decreased cognition;Decreased knowledge of use of DME or AE;Decreased knowledge of precautions;Decreased safety awareness;Impaired UE functional use OT Therapy Diagnosis : Generalized weakness;Cognitive deficits;Disturbance of vision;Apraxia OT Plan OT Frequency: Min 2X/week OT Recommendation Recommendations for Other Services: Rehab  consult Follow Up Recommendations: Inpatient Rehab Equipment Recommended: Defer to next venue Individuals Consulted Consulted and Agree with Results and Recommendations: Family member/caregiver;Patient OT Goals Acute Rehab OT Goals OT Goal Formulation: With patient/family Time For Goal Achievement: 2 weeks ADL Goals Pt Will Perform Grooming: with set-up;Sitting, chair;Unsupported ADL Goal: Grooming - Progress: Goal set today Pt Will Perform Upper Body Bathing: with set-up;Sitting, chair;Supported ADL Goal: Product manager - Progress: Goal set today Pt Will Perform Lower Body Bathing: with mod assist;Sit to stand from chair;Sit to stand from bed;Supported ADL Goal: Lower Body Bathing - Progress: Goal set today Pt Will Perform Upper Body Dressing: with set-up;Sitting, chair;Supported ADL Goal: Location manager Dressing - Progress: Goal set today Pt Will Perform Lower Body Dressing: with mod assist;Sit to stand from chair;Sit to stand from bed;Supported ADL Goal: Lower Body Dressing - Progress: Goal set today Pt Will Transfer to Toilet: with supervision;3-in-1;Ambulation ADL Goal: Toilet Transfer - Progress: Goal set today Miscellaneous OT Goals Miscellaneous OT Goal #1: Pt will place 4 out 6 objects in container on first try without undershooting demonstrating improvement in motor planning as precursor to sink level ADLS OT Goal: Miscellaneous Goal #1 - Progress: Goal set today  OT Evaluation Precautions/Restrictions  Precautions Precautions: Fall Restrictions Weight Bearing Restrictions: No Prior Functioning Home Living Lives With: Spouse Receives Help From: Family Type of Home: House Home Layout: One level Home Access: Stairs to enter (also has Ramp- 1 step into kitchen with no rails) Entrance Stairs-Rails: Right Entrance Stairs-Number of Steps: 3 Bathroom Shower/Tub: Walk-in shower;Door [employer] Toilet: Standard Bathroom Accessibility: Yes How Accessible: Accessible via  walker Home Adaptive Equipment: Crutches;Straight cane;Wheelchair -  manual Prior Function Level of Independence: Independent with basic ADLs;Independent with transfers;Independent with gait Able to Take Stairs?: Yes Driving: Yes Vocation: Retired Comments: playing with dog (english springer spaniel 75 months old)- Bruce ADL ADL Eating/Feeding: Performed;Minimal assistance Eating/Feeding Details (indicate cue type and reason): pt with motor planning deficits and requires (A) at elbow  Where Assessed - Eating/Feeding: Chair Grooming: Performed;Teeth care;Moderate assistance Grooming Details (indicate cue type and reason): pt performing oral care in prep for breakfast PO intake. Pt opening tooth paste with increased time and problem solving to hold in Rt hand and unscrew with Lt UE. Pt required hand over hand to use Rt UE to brush teeth. Pt compensating with head movement due to Rt Ue apraxia. Where Assessed - Grooming: Sitting, chair;Supported Location manager Dressing: Performed;Minimal assistance Where Assessed - Upper Body Dressing: Sitting, bed;Unsupported (don gown as robe) Toilet Transfer: Simulated;Minimal Dentist Method: Stand pivot (Lt side) Equipment Used:  (hand held (A)) Ambulation Related to ADLs: none  ADL Comments: Pt supine<>sit eob Min (A). Pt sitting with fair balance. pt with Rt UE weakness shoulder flexion. Pt unable to maintain Rt UE shoulder flexion . Pt with Rt UE drifting downward with gravity. Pt sit<>stand with buckle initially and then progressed to static standing Min (A). Vision/Perception  Vision - History Baseline Vision: Wears glasses all the time Visual History: Cataracts (cataract developing on Lt eye per wife) Patient Visual Report: Undershooting Vision - Assessment Eye Alignment: Within Functional Limits Vision Assessment: Vision tested Ocular Range of Motion: Within Functional Limits Convergence: Impaired - to be further tested in  functional context Additional Comments: pt with nystagmus noted at end range of oculomotor testing Praxis Praxis: Impaired Praxis Impairment Details: Motor planning Cognition Cognition Arousal/Alertness: Awake/alert Overall Cognitive Status: History of cognitive impairments History of Cognitive Impairment: Appears at baseline functioning Orientation Level: Oriented to person;Oriented to place;Oriented to time Sensation/Coordination Sensation Light Touch: Appears Intact Proprioception: Impaired by gross assessment Coordination Gross Motor Movements are Fluid and Coordinated: Yes (difficulty with motor intiation, mild apraxia) Fine Motor Movements are Fluid and Coordinated: Yes Finger Nose Finger Test: undershooting with increased time. unable to isolate single digit on Rt hand to perform task. Pt moving hand as a fist Heel Shin Test: difficulty with initiation to begin with RLE although no discoordination Extremity Assessment RUE Assessment RUE Assessment: Exceptions to [facility] RUE Strength Right Shoulder Flexion: 3+/5 Right Elbow Flexion: 3+/5 Right Elbow Extension: 4/5 Gross Grasp: Functional LUE Assessment LUE Assessment: Within Functional Limits Mobility  Bed Mobility Bed Mobility: Yes Rolling Right: 5: Supervision;With rail Rolling Right Details (indicate cue type and reason): VC for hand placement Right Sidelying to Sit: 4: Min assist;HOB flat;With rails Right Sidelying to Sit Details (indicate cue type and reason): VC for hand placement and sequencing with RUE. Pt needed min assist to sit bearing weight through RUE Sitting - Scoot to Edge of Bed: 6: Modified independent (Device/Increase time) Transfers Transfers: Yes Sit to Stand: 4: Min assist;With upper extremity assist;From bed Sit to Stand Details (indicate cue type and reason): VC for sequencing and anterior translation. Pt with slight unsteadiness upon standing although he was able to self-correct with min assist to  stabilize in standing Stand to Sit: 5: Supervision;With upper extremity assist;To chair/3-in-1 Stand to Sit Details: VC for hand placement and safety upon sitting. Exercises   End of Session OT - End of Session Equipment Utilized During Treatment: Gait belt Activity Tolerance: Patient tolerated treatment well (pt reports feeling sleepy) Patient left:  in chair;with call bell in reach;with family/visitor present (wife) Nurse Communication: Mobility status for transfers General Behavior During Session: [facility] for tasks performed Cognition: Impaired   Ahmeer, Tuman 10/10/2011, 10:23 AM  Pager: [phone]

## 2011-10-10 NOTE — Progress Notes (Signed)
Utilization review completed. Nyzier Boivin, RN, BSN.  10/10/11 

## 2011-10-10 NOTE — Consult Note (Signed)
Physical Medicine and Rehabilitation Consult Reason for Consult: Left thalamic hemorrhage Referring Phsyician: Dr. Maylon Peppers is an 66 y.o. male.   HPI: 66 year old right-handed male with history of hypertension and alcohol abuse who was admitted February 5 with sudden onset of right hemiparesis and hematemesis after being found on the floor by his son. The patient had bowel incontinence and had vomited his dinner. Blood pressure upon admission was 193/104. CT scan of the brain showed left thalamic basal ganglia intraparenchymal hemorrhage with extension into the ventricular system. Latest followup scan February 6 no change in  hemorrhage with axial measurement of 30 x 18 mm. Carotid Dopplers with no internal carotid artery stenosis. Echocardiogram with ejection fraction 60% no wall motion abnormalities. Urine drug screen was negative. Neurosurgery consulted Dr. Sanjuana Mae and advise conservative care with serial followup cranial CT scans. Maintained on Cardene drip for blood pressure management. Gastroenterology was consulted to Dr.Pyrtle of [facility] and noted hemoglobin to be stable at this time with plans for EGD once stable from a neurology standpoint. Currently maintained on a dysphagia 3 nectar thick liquid diet per speech therapy. Currently required minimal assist for stand pivot transfers ambulation yet to be tested. Physical medicine and rehabilitation was consulted for consideration of inpatient rehabilitation services  Review of Systems  Eyes: Negative for double vision.  Respiratory: Negative for cough and shortness of breath.   Gastrointestinal: Positive for vomiting.  Musculoskeletal: Positive for myalgias.  Neurological: Positive for headaches.  Psychiatric/Behavioral:       Anxiety  All other systems reviewed and are negative.   Past Medical History  Diagnosis Date  . Gout   . Hypertension   . Osteoarthritis     neck  . Impaired fasting glucose   . Alcoholism      still drinking wine as of Feb 2013  . Gastric ulcer 2011    egd at [facility] Aug 2011.  . Duodenal ulcer 2011   Past Surgical History  Procedure Date  . Cystogram 03/08   Family History  Problem Relation Age of Onset  . Fibromyalgia Mother   . Diabetes Neg Hx    Social History:  reports that he quit smoking about 33 years ago. He does not have any smokeless tobacco history on file. He reports that he drinks alcohol. His drug history not on file. Allergies:  Allergies  Allergen Reactions  . Hydrochlorothiazide Other (See Comments)    unknown  . Ramipril Other (See Comments)    unknown   Medications Prior to Admission  Medication Dose Route Frequency Provider Last Rate Last Dose  . acetaminophen (TYLENOL) tablet 650 mg  650 mg Oral Q4H PRN Carmelina Peal, MD   650 mg at 10/10/11 0851   Or  . acetaminophen (TYLENOL) suppository 650 mg  650 mg Rectal Q4H PRN Carmelina Peal, MD      . atenolol (TENORMIN) tablet 100 mg  100 mg Oral BID Annie Main, NP   100 mg at 10/10/11 1000  . cloNIDine (CATAPRES) tablet 0.1 mg  0.1 mg Oral BID Annie Main, NP   0.1 mg at 10/10/11 1000  . doxazosin (CARDURA) tablet 8 mg  8 mg Oral QHS Annie Main, NP      . folic acid injection 1 mg  1 mg Intravenous Daily Lesly Dukes, MD   1 mg at 10/10/11 0959  . food thickener (THICK IT) powder   Oral PRN Annie Main, NP      . hydrALAZINE (  APRESOLINE) tablet 100 mg  100 mg Oral TID Annie Main, NP   100 mg at 10/10/11 1000  . labetalol (NORMODYNE,TRANDATE) 5 MG/ML injection        10 mg at 10/08/11 0115  . labetalol (NORMODYNE,TRANDATE) injection 10-40 mg  10-40 mg Intravenous Q10 min PRN Carmelina Peal, MD   20 mg at 10/10/11 0443  . LORazepam (ATIVAN) injection 1 mg  1 mg Intravenous Q4H PRN Lesly Dukes, MD   1 mg at 10/10/11 0101  . losartan (COZAAR) tablet 50 mg  50 mg Oral Daily Annie Main, NP   50 mg at 10/10/11 1000  . niCARdipine (CARDENE-IV) infusion (0.1 mg/ml)  5 mg/hr  Intravenous Continuous Annie Main, NP   3 mg/hr at 10/10/11 1107  . octreotide (SANDOSTATIN) 2 mcg/mL in sodium chloride 0.9 % 250 mL infusion  25-50 mcg/hr Intravenous To Major Nathan R. Pickering, MD 12.5 mL/hr at 10/08/11 0201 25 mcg/hr at 10/08/11 0201  . ondansetron (ZOFRAN) 4 MG/2ML injection        4 mg at 10/08/11 0226  . ondansetron (ZOFRAN) injection 4 mg  4 mg Intravenous Q6H PRN Rajani Caesar, MD      . pantoprazole (PROTONIX) injection 40 mg  40 mg Intravenous Q12H Dianah Field, PA   40 mg at 10/10/11 0959  . potassium chloride 10 mEq in 100 mL IVPB  10 mEq Intravenous Q1 Hr x 3 Lesly Dukes, MD   10 mEq at 10/08/11 1226  . potassium chloride 10 mEq in 100 mL IVPB  10 mEq Intravenous Once Lesly Dukes, MD   10 mEq at 10/08/11 1354  . potassium chloride 20 MEQ/15ML (10%) liquid 20 mEq  20 mEq Oral BID Lesly Dukes, MD   20 mEq at 10/10/11 1000  . sodium chloride 0.9 % 1,000 mL with multivitamins adult (MVI -12) 10 mL, potassium chloride 20 mEq infusion   Intravenous Q24H Mcarthur Rossetti. Tyson Alias, MD      . sodium chloride 0.9 % 1,000 mL with potassium chloride 20 mEq infusion   Intravenous Q24H Mcarthur Rossetti. Tyson Alias, MD      . thiamine (B-1) injection 100 mg  100 mg Intravenous Daily Lesly Dukes, MD   100 mg at 10/10/11 0959  . DISCONTD: 0.9 %  sodium chloride infusion   Intravenous Continuous Juliet Rude. Pickering, MD      . DISCONTD: acetaminophen (TYLENOL) tablet 650 mg  650 mg Oral Once [employer]. Pickering, MD      . DISCONTD: multivitamins adult (MVI -12) 10 mL in dextrose 5% lactated ringers 1,000 mL infusion  10 mL Intravenous Q24H Mcarthur Rossetti. Tyson Alias, MD      . DISCONTD: niCARdipine (CARDENE-IV) infusion (0.1 mg/ml)  5 mg/hr Intravenous Continuous Carmell Austria, MD      . DISCONTD: octreotide (SANDOSTATIN) 2 mcg/mL in sodium chloride 0.9 % 250 mL infusion  25-50 mcg/hr Intravenous Continuous Juliet Rude. Rubin Payor, MD   25 mcg/hr at 10/09/11 0700  .  DISCONTD: pantoprazole (PROTONIX) 80 mg in sodium chloride 0.9 % 250 mL infusion  8 mg/hr Intravenous Continuous Dianah Field, PA   8 mg/hr at 10/09/11 0700  . DISCONTD: pantoprazole (PROTONIX) injection 40 mg  40 mg Intravenous QHS Rajani Caesar, MD      . DISCONTD: senna-docusate (Senokot-S) tablet 1 tablet  1 tablet Oral BID Carmelina Peal, MD      . DISCONTD: sodium chloride 0.9 % 1,000 mL with potassium chloride 20 mEq infusion  Intravenous Continuous Lesly Dukes, MD 75 mL/hr at 10/08/11 0807     Medications Prior to Admission  Medication Sig Dispense Refill  . acetaminophen (TYLENOL) 500 MG tablet Take 500 mg by mouth 3 (three) times daily as needed. For pain      . atenolol (TENORMIN) 100 MG tablet Take 100 mg by mouth 2 (two) times daily.        Marland Kitchen doxazosin (CARDURA) 8 MG tablet Take 8 mg by mouth at bedtime.          Home: Home Living Lives With: Spouse Receives Help From: Family Type of Home: House Home Layout: One level Home Access: Stairs to enter (also has Ramp- 1 step into kitchen with no rails) Entrance Stairs-Rails: Right Entrance Stairs-Number of Steps: 3 Bathroom Shower/Tub: Walk-in shower;Door [employer] Toilet: Standard Bathroom Accessibility: Yes How Accessible: Accessible via walker Home Adaptive Equipment: Crutches;Straight cane;Wheelchair - manual  Functional History: Prior Function Level of Independence: Independent with basic ADLs;Independent with transfers;Independent with gait Able to Take Stairs?: Yes Driving: Yes Vocation: Retired Comments: playing with dog (english springer spaniel 57 months old)- Bruce Functional Status:  Mobility: Bed Mobility Bed Mobility: Yes Rolling Right: 5: Supervision;With rail Rolling Right Details (indicate cue type and reason): VC for hand placement Right Sidelying to Sit: 4: Min assist;HOB flat;With rails Right Sidelying to Sit Details (indicate cue type and reason): VC for hand placement and sequencing with  RUE. Pt needed min assist to sit bearing weight through RUE Sitting - Scoot to Edge of Bed: 6: Modified independent (Device/Increase time) Transfers Transfers: Yes Sit to Stand: 4: Min assist;With upper extremity assist;From bed Sit to Stand Details (indicate cue type and reason): VC for sequencing and anterior translation. Pt with slight unsteadiness upon standing although he was able to self-correct with min assist to stabilize in standing Stand to Sit: 5: Supervision;With upper extremity assist;To chair/3-in-1 Stand to Sit Details: VC for hand placement and safety upon sitting. Stand Pivot Transfers: 4: Min assist Stand Pivot Transfer Details (indicate cue type and reason): Transfer from bed to chair with 2-3 steps. Min assist for stability Ambulation/Gait Ambulation/Gait: No    ADL: ADL Eating/Feeding: Performed;Minimal assistance Eating/Feeding Details (indicate cue type and reason): pt with motor planning deficits and requires (A) at elbow  Where Assessed - Eating/Feeding: Chair Grooming: Performed;Teeth care;Moderate assistance Grooming Details (indicate cue type and reason): pt performing oral care in prep for breakfast PO intake. Pt opening tooth paste with increased time and problem solving to hold in Rt hand and unscrew with Lt UE. Pt required hand over hand to use Rt UE to brush teeth. Pt compensating with head movement due to Rt Ue apraxia. Where Assessed - Grooming: Sitting, chair;Supported Location manager Dressing: Performed;Minimal assistance Where Assessed - Upper Body Dressing: Sitting, bed;Unsupported (don gown as robe) Toilet Transfer: Simulated;Minimal Dentist Method: Stand pivot (Lt side) Equipment Used:  (hand held (A)) Ambulation Related to ADLs: none  ADL Comments: Pt supine<>sit eob Min (A). Pt sitting with fair balance. pt with Rt UE weakness shoulder flexion. Pt unable to maintain Rt UE shoulder flexion . Pt with Rt UE drifting downward with  gravity. Pt sit<>stand with buckle initially and then progressed to static standing Min (A).  Cognition: Cognition Arousal/Alertness: Awake/alert Orientation Level: Oriented to person;Oriented to place;Oriented to time Cognition Arousal/Alertness: Awake/alert Overall Cognitive Status: History of cognitive impairments History of Cognitive Impairment: Appears at baseline functioning Orientation Level: Oriented to person;Oriented to place;Oriented to time  Blood pressure  132/82, pulse 68, temperature 98.7 F (37.1 C), temperature source Oral, resp. rate 21, height 5\' 10"  (1.778 m), weight 93 kg (205 lb 0.4 oz), SpO2 99.00%. Physical Exam  Vitals reviewed. Constitutional: He appears well-developed.  HENT:  Head: Normocephalic.  Neck: Normal range of motion. Neck supple. No thyromegaly present.  Cardiovascular: Normal rate and regular rhythm.   Pulmonary/Chest: Breath sounds normal. He has no wheezes.  Abdominal: He exhibits no distension. There is no tenderness.  Musculoskeletal: He exhibits no edema.  Neurological:       Lethargic but arousable. Speech is dysarthric and difficult to understand at times. Follows one step commands  Skin: Skin is warm and dry.  Psychiatric: He has a normal mood and affect.   motor strength is 4/5 in the right deltoid, biceps, triceps, and grip and 4/5 in the right hip flexor, knee flexor, ankle dorsiflexor, quad and gastroc Motor strength is 5/5 in the left deltoid bicep triceps and grip hip flexor knee extensor ankle dorsiflexor Sensation is intact to light touch on the right side however patient is laying on his right arm and does not seem to be aware of it. Tone shows no evidence of significant spasticity Speech he is able to state name, gr for hospital, February but does have some word finding difficulties. He calls his thumb and his big toe on the little foot  Results for orders placed during the hospital encounter of 10/08/11 (from the past 24  hour(s))  BASIC METABOLIC PANEL     Status: Abnormal   Collection Time   10/09/11  9:25 PM      Component Value Range   Sodium 141  135 - 145 (mEq/L)   Potassium 3.7  3.5 - 5.1 (mEq/L)   Chloride 104  96 - 112 (mEq/L)   CO2 25  19 - 32 (mEq/L)   Glucose, Bld 101 (*) 70 - 99 (mg/dL)   BUN 14  6 - 23 (mg/dL)   Creatinine, Ser 1.61  0.50 - 1.35 (mg/dL)   Calcium 9.9  8.4 - 09.6 (mg/dL)   GFR calc non Af Amer 71 (*) >90 (mL/min)   GFR calc Af Amer 82 (*) >90 (mL/min)  MAGNESIUM     Status: Normal   Collection Time   10/09/11  9:25 PM      Component Value Range   Magnesium 1.9  1.5 - 2.5 (mg/dL)  PHOSPHORUS     Status: Abnormal   Collection Time   10/09/11  9:25 PM      Component Value Range   Phosphorus 1.4 (*) 2.3 - 4.6 (mg/dL)  COMPREHENSIVE METABOLIC PANEL     Status: Abnormal   Collection Time   10/10/11  5:20 AM      Component Value Range   Sodium 141  135 - 145 (mEq/L)   Potassium 3.3 (*) 3.5 - 5.1 (mEq/L)   Chloride 104  96 - 112 (mEq/L)   CO2 25  19 - 32 (mEq/L)   Glucose, Bld 133 (*) 70 - 99 (mg/dL)   BUN 14  6 - 23 (mg/dL)   Creatinine, Ser 0.45  0.50 - 1.35 (mg/dL)   Calcium 9.5  8.4 - 40.9 (mg/dL)   Total Protein 7.3  6.0 - 8.3 (g/dL)   Albumin 3.6  3.5 - 5.2 (g/dL)   AST 18  0 - 37 (U/L)   ALT 11  0 - 53 (U/L)   Alkaline Phosphatase 45  39 - 117 (U/L)   Total Bilirubin 0.9  0.3 - 1.2 (mg/dL)   GFR calc non Af Amer >90  >90 (mL/min)   GFR calc Af Amer >90  >90 (mL/min)  MAGNESIUM     Status: Normal   Collection Time   10/10/11  5:20 AM      Component Value Range   Magnesium 1.9  1.5 - 2.5 (mg/dL)  PHOSPHORUS     Status: Normal   Collection Time   10/10/11  5:20 AM      Component Value Range   Phosphorus 2.6  2.3 - 4.6 (mg/dL)  CBC     Status: Normal   Collection Time   10/10/11 10:32 AM      Component Value Range   WBC 9.1  4.0 - 10.5 (K/uL)   RBC 4.66  4.22 - 5.81 (MIL/uL)   Hemoglobin 15.2  13.0 - 17.0 (g/dL)   HCT 40.9  81.1 - 91.4 (%)   MCV 95.7  78.0 -  100.0 (fL)   MCH 32.6  26.0 - 34.0 (pg)   MCHC 34.1  30.0 - 36.0 (g/dL)   RDW 78.2  95.6 - 21.3 (%)   Platelets 153  150 - 400 (K/uL)  BASIC METABOLIC PANEL     Status: Abnormal   Collection Time   10/10/11 10:32 AM      Component Value Range   Sodium 140  135 - 145 (mEq/L)   Potassium 3.5  3.5 - 5.1 (mEq/L)   Chloride 103  96 - 112 (mEq/L)   CO2 24  19 - 32 (mEq/L)   Glucose, Bld 167 (*) 70 - 99 (mg/dL)   BUN 14  6 - 23 (mg/dL)   Creatinine, Ser 0.86  0.50 - 1.35 (mg/dL)   Calcium 9.9  8.4 - 57.8 (mg/dL)   GFR calc non Af Amer >90  >90 (mL/min)   GFR calc Af Amer >90  >90 (mL/min)   Ct Head Wo Contrast  10/09/2011  *RADIOLOGY REPORT*  Clinical Data: Follow-up intracranial hemorrhage  CT HEAD WITHOUT CONTRAST  Technique:  Contiguous axial images were obtained from the base of the skull through the vertex without contrast.  Comparison: 10/08/2011  Findings: Intraparenchymal hematoma in the left thalamus is no larger measuring 30 x 19 mm transversely.  No evidence of additional bleeding or extension.  Intraventricular penetration remains evident with small amount of blood in the anterior horns of the lateral ventricles and blood in the third and fourth ventricles.  Ventricular size is stable.  There is no evidence of herniation.  Elsewhere, the brain shows chronic small vessel change within the deep white matter.  No sign of acute ischemic infarction.  The calvarium is unremarkable.  Sinuses are clear.  IMPRESSION: No change in the left thalamic hematoma with intraventricular penetration.  Axial measurement of 30 x 18 mm.  Original Report Authenticated By: Thomasenia Sales, M.D.   Dg Chest Port 1 View  10/10/2011  *RADIOLOGY REPORT*  Clinical Data: Evaluate endotracheal tube  PORTABLE CHEST - 1 VIEW  Comparison: 10/08/2011  Findings:  Grossly unchanged borderline enlarged cardiac silhouette and mediastinal contours.  No support apparatus identified on this examination.  Pulmonary vasculature remains  indistinct with cephalization of flow.  Grossly unchanged perihilar and basilar heterogeneous opacities.  No definite pleural effusion or pneumothorax.  Grossly unchanged bones.  IMPRESSION: Unchanged findings of borderline enlarged cardiomediastinal silhouette and mild pulmonary edema.  Further evaluation with PA and lateral chest radiograph may be obtained as clinically indicated.  Original Report Authenticated By: Jonny Ruiz  A. Judithann Sheen, M.D.   Dg Swallowing Func-no Report  10/09/2011  CLINICAL DATA: dysphagia   FLUOROSCOPY FOR SWALLOWING FUNCTION STUDY:  Fluoroscopy was provided for swallowing function study, which was  administered by a speech pathologist.  Final results and recommendations  from this study are contained within the speech pathology report.      Assessment/Plan: Diagnosis: Left thalamic and basal ganglia hemorrhage causing a fascia, right-sided weakness and sensory deficits 1. Does the need for close, 24 hr/day medical supervision in concert with the patient's rehab needs make it unreasonable for this patient to be served in a less intensive setting? Potentially 2. Co-Morbidities requiring supervision/potential complications: Alcohol abuse, anxiety, hypertension 3. Due to bladder management, bowel management, safety, medication administration and patient education, does the patient require 24 hr/day rehab nursing? Yes 4. Does the patient require coordinated care of a physician, rehab nurse, PT (1-2 hrs/day, 5 days/week), OT (1-2 hrs/day, 5 days/week) and SLP (1-2 hrs/day, 5 days/week) to address physical and functional deficits in the context of the above medical diagnosis(es)? Potentially Addressing deficits in the following areas: balance, endurance, locomotion, strength, transferring, bathing, dressing, toileting, cognition, speech and language 5. Can the patient actively participate in an intensive therapy program of at least 3 hrs of therapy per day at least 5 days per week?  Yes 6. The potential for patient to make measurable gains while on inpatient rehab is excellent 7. Anticipated functional outcomes upon discharge from inpatients are modified independent mobility PT, modified independent ADLs OT, 90% speech intelligibility and ability express basic needs with 100% accuracy SLP 8. Estimated rehab length of stay to reach the above functional goals is: 7-10 days 9. Does the patient have adequate social supports to accommodate these discharge functional goals? Yes 10. Anticipated D/C setting: Home 11. Anticipated post D/C treatments: Outpt therapy 12. Overall Rehab/Functional Prognosis: excellent  RECOMMENDATIONS: This patient's condition is appropriate for continued rehabilitative care in the following setting: CIR Patient has agreed to participate in recommended program. Yes Note that insurance prior authorization may be required for reimbursement for recommended care.  Comment: Rehabilitation RM to assess physical therapy progress in a.m. If patient has RE progress to a supervision level then I would recommend discharge to home with 24-hour supervision in outpatient speech therapy followup.   ANGIULLI,DANIEL J. 10/10/2011

## 2011-10-10 NOTE — Progress Notes (Signed)
SPEECH PATHOLOGY DYSPHAGIA TREATMENT NOTE  Patient was observed with : Mechanical Soft / Ground and Nectar liquids.  Patient was noted to have s/s of aspiration : Yes  Lung Sounds: Not yet recorded today  Temperature: 98.2  Patient required: Moderate cues to consistently follow precautions/strategies  Clinical Impression: Observation of part of breakfast meal with wife present. Pt.required cues to state swallow precautions and moderate cues for follow through during meal. SLP explained rationale of the double swallow strategy. Intermittent wet vocal quality indicative of possible material in laryngeal vestibule. SLP cued pt. to "test" vocal quality by saying "ah" several times throughout meals and explained what he is listening for (wet vocal quality).  Recommendations: Continue Dys 3 and nectar thick liquids. Speech-language-cognitive eval will be initiated as well.  Pain: none  Intervention Required: No  Goals:  No Goals Met   Breck Coons [place] M.Ed CCC-SLP Pager [phone]  10/10/2011   Breck Coons Lonell Face.Ed CCC-SLP

## 2011-10-10 NOTE — Progress Notes (Signed)
INITIAL ADULT NUTRITION ASSESSMENT Date: 10/10/2011   Time: 10:49 AM  Reason for Assessment: Dysphagia  ASSESSMENT: Male 66 y.o.  Dx: Stroke  Hx:  Past Medical History  Diagnosis Date  . Gout   . Hypertension   . Osteoarthritis     neck  . Impaired fasting glucose   . Alcoholism     still drinking wine as of Feb 2013  . Gastric ulcer 2011    egd at [facility] Aug 2011.  . Duodenal ulcer 2011   Related Meds:     . atenolol  100 mg Oral BID  . cloNIDine  0.1 mg Oral BID  . doxazosin  8 mg Oral QHS  . folic acid  1 mg Intravenous Daily  . hydrALAZINE  100 mg Oral TID  . losartan  50 mg Oral Daily  . pantoprazole (PROTONIX) IV  40 mg Intravenous Q12H  . potassium chloride  20 mEq Oral BID  . sodium chloride 0.9 % 1,000 mL with multivitamins adult (MVI -12) 10 mL, potassium chloride 20 mEq infusion   Intravenous Q24H  . sodium chloride 0.9 % 1,000 mL with potassium chloride 20 mEq infusion   Intravenous Q24H  . thiamine  100 mg Intravenous Daily  . DISCONTD: multivitamin (MVI) IV infusion (LR or D5LR)  10 mL Intravenous Q24H   Ht: 5\' 10"  (177.8 cm)  Wt: 205 lb 0.4 oz (93 kg)  Ideal Wt: 75.5 kg  % Ideal Wt: 123%  Usual Wt: 75 kg % Usual Wt: 101%  Body mass index is 29.42 kg/(m^2)., which is overweight  Food/Nutrition Related Hx: Patient reports a good appetite PTA. Since diet advanced, patient reports that he eats slower due to right hand weakness, but his appetite remains good and was observed eating 100% of lunch. He declines snacks and supplements at this time. MBS 2/6.   Labs:  CMP     Component Value Date/Time   NA 141 10/10/2011 0520   K 3.3* 10/10/2011 0520   CL 104 10/10/2011 0520   CO2 25 10/10/2011 0520   GLUCOSE 133* 10/10/2011 0520   BUN 14 10/10/2011 0520   CREATININE 0.83 10/10/2011 0520   CALCIUM 9.5 10/10/2011 0520   PROT 7.3 10/10/2011 0520   ALBUMIN 3.6 10/10/2011 0520   AST 18 10/10/2011 0520   ALT 11 10/10/2011 0520   ALKPHOS 45 10/10/2011 0520   BILITOT 0.9 10/10/2011 0520   GFRNONAA >90 10/10/2011 0520   GFRAA >90 10/10/2011 0520    Intake/Output Summary (Last 24 hours) at 10/10/11 1052 Last data filed at 10/10/11 1000  Gross per 24 hour  Intake 3213.33 ml  Output   1675 ml  Net 1538.33 ml   Diet Order: Dysphagia 3, Nectar  Supplements/Tube Feeding: None  IVF:    niCARDipine Last Rate: 6 mg/hr (10/10/11 1000)  DISCONTD: sodium chloride 0.9 % 1,000 mL with potassium chloride 20 mEq infusion Last Rate: 75 mL/hr at 10/08/11 0807    Estimated Nutritional Needs:   Kcal:2075-2250 kcal Protein:93-112 g Fluid:2.8 L  NUTRITION DIAGNOSIS: -Swallowing difficulty (NI-1.1).  Status: Ongoing  RELATED TO: stroke  AS EVIDENCE BY: Dysphagia 3, nectar thick diet  MONITORING/EVALUATION(Goals): Patient will tolerate oral intake to meet 90-100% of estimated nutrition needs.   Monitor: PO intake/need for snacks or supplements, weight, labs.   EDUCATION NEEDS: -Education not appropriate at this time  INTERVENTION: No nutrition interventions at this time. Diet per SLP. Will reassess PO intake and need for additional snacks or supplements.  DOCUMENTATION CODES Per approved criteria  -Not Applicable    Linnell Fulling [facility] 10/10/2011, 10:49 AM

## 2011-10-11 ENCOUNTER — Encounter (HOSPITAL_COMMUNITY): Payer: Self-pay | Admitting: Radiology

## 2011-10-11 ENCOUNTER — Inpatient Hospital Stay (HOSPITAL_COMMUNITY): Payer: Medicare Other

## 2011-10-11 LAB — CBC
Hemoglobin: 13.9 g/dL (ref 13.0–17.0)
RBC: 4.26 MIL/uL (ref 4.22–5.81)
WBC: 9.6 10*3/uL (ref 4.0–10.5)

## 2011-10-11 LAB — BASIC METABOLIC PANEL
GFR calc Af Amer: >90 mL/min (ref >90)
GFR calc non Af Amer: >90 mL/min (ref >90)
Potassium: 3.8 mEq/L (ref 3.5–5.1)
Sodium: 139 mEq/L (ref 135–145)

## 2011-10-11 MED ORDER — ATENOLOL 50 MG PO TABS
50.0000 mg | ORAL_TABLET | Freq: Two times a day (BID) | ORAL | Status: DC
  Administered 2011-10-11: 50 mg via ORAL
  Filled 2011-10-11 (×6): qty 1

## 2011-10-11 MED ORDER — PRENATAL MULTIVITAMIN CH: 1.0000 | ORAL_TABLET | Freq: Every day | ORAL | Status: DC

## 2011-10-11 MED ORDER — PANTOPRAZOLE SODIUM 40 MG PO TBEC
40.0000 mg | DELAYED_RELEASE_TABLET | Freq: Every day | ORAL | Status: DC
  Administered 2011-10-11: 40 mg via ORAL
  Filled 2011-10-11: qty 1

## 2011-10-11 MED ORDER — ADULT MULTIVITAMIN W/MINERALS CH
1.0000 | ORAL_TABLET | Freq: Every day | ORAL | Status: DC
  Administered 2011-10-11: 1 via ORAL
  Filled 2011-10-11 (×3): qty 1

## 2011-10-11 NOTE — Progress Notes (Signed)
Physical Therapy Treatment Patient Details Name: Carl [name] MRN: [id number] DOB: [birth year] Today's Date: 10/11/2011  PT Assessment/Plan  PT - Assessment/Plan Comments on Treatment Session: Pt progressing this session, pt able to ambulate although required increased assistance for stability as well as cueing throughout secondary to decreased motor planning and safety. PT Plan: Discharge plan remains appropriate;Frequency remains appropriate PT Frequency: Min 4X/week Recommendations for Other Services: Rehab consult Follow Up Recommendations: Inpatient Rehab Equipment Recommended: Defer to next venue PT Goals  Acute Rehab PT Goals PT Goal Formulation: With patient/family PT Goal: Supine/Side to Sit - Progress: Progressing toward goal PT Goal: Sit to Supine/Side - Progress: Progressing toward goal PT Goal: Sit to Stand - Progress: Progressing toward goal PT Goal: Stand to Sit - Progress: Met PT Transfer Goal: Bed to Chair/Chair to Bed - Progress: Progressing toward goal PT Goal: Ambulate - Progress: Progressing toward goal  PT Treatment Precautions/Restrictions  Precautions Precautions: Fall Required Braces or Orthoses: No Restrictions Weight Bearing Restrictions: No Mobility (including Balance) Bed Mobility Bed Mobility: Yes Rolling Right: 5: Supervision (Mod v/c for Rt UE positioning. ) Rolling Right Details (indicate cue type and reason): Pt rolling onto Rt arm with decreased awareness and demonstrating decreased sensation. pt activating rt log roll with Lt side only Supine to Sit: 4: Min assist;HOB elevated (Comment degrees) (min guard) Supine to Sit Details (indicate cue type and reason): Pt activating Lt side of trunk (core) for supine <> sit. Pt long sitting and pulling on bed rail at foot board with Lt side to scoot toward EOB. Pt with Rt lean slightly with supine<>sit. Pt could benefit from facilitation to Rt side and increased bed mobility to Supervision level  Sitting -  Scoot to Edge of Bed: 6: Modified independent (Device/Increase time) Transfers Transfers: Yes Sit to Stand: 2: Max assist;With upper extremity assist;From bed (Simultaneous filing. User may not have seen previous data.) Sit to Stand Details (indicate cue type and reason): Pt sit<>Stand with orthostatic BP demonstration this session. Pt reports no symptoms during session. Pt static standing with min A. Pt provided verbal cue to march in place and then visual cue to help incitiate movement. (Simultaneous filing. User may not have seen previous data.) Stand to Sit: 4: Min assist;With upper extremity assist;To chair/3-in-1 (Simultaneous filing. User may not have seen previous data.) Stand to Sit Details: (A) to control descend and sequence task (Simultaneous filing. User may not have seen previous data.) Ambulation/Gait Ambulation/Gait: Yes Ambulation/Gait Assistance: 3: Mod assist Ambulation/Gait Assistance Details (indicate cue type and reason): Mod assist for stabilization throughout gait. Pt with wide base of support with decreased stance time. Ambulation Distance (Feet): 150 Feet (pt required 2 rest breaks for fatigue ) Assistive device: Rolling walker Gait Pattern: Decreased stance time - right;Decreased stance time - left;Decreased weight shift to left;Decreased hip/knee flexion - right;Decreased hip/knee flexion - left;Trunk flexed Gait velocity: Decreased gait speed Stairs: No    Exercise    End of Session PT - End of Session Equipment Utilized During Treatment: Gait belt Activity Tolerance: Patient tolerated treatment well Patient left: in chair;with call bell in reach;with family/visitor present Nurse Communication: Mobility status for transfers;Mobility status for ambulation General Behavior During Session: [facility] for tasks performed Cognition: Impaired  Milana Kidney 10/11/2011, 1:45 PM  10/11/2011 Milana Kidney DPT PAGER: [phone] OFFICE: [phone]

## 2011-10-11 NOTE — Progress Notes (Signed)
Patient ID: Carl [name], male   DOB: May 21, 1946, 66 y.o.   MRN: [id number] [facility] Progress Note  Subjective: Sleeping currently, wife at bedside- he has been awake,dysarthric, able to eat without difficulty. No evidence for any bleeding.  Objective:  Vital signs in last 24 hours: Temp:  [98.5 F (36.9 C)-98.7 F (37.1 C)] 98.5 F (36.9 C) (02/07 1600) Pulse Rate:  [25-95] 54  (02/08 0600) Resp:  [17-24] 18  (02/08 0600) BP: (100-163)/(69-109) 158/77 mmHg (02/08 0600) SpO2:  [92 %-100 %] 98 % (02/08 0600) Weight:  [202 lb 2.6 oz (91.7 kg)] 202 lb 2.6 oz (91.7 kg) (02/07 1900)   General:   Alert,  Well-developed,sleeping    in NAD Heart:  Regular rate and rhythm; no murmurs Pulm;claer anterior Abdomen:  Soft, nontender and nondistended. Normal bowel sounds, without guarding,    Extremities:  Without edema. Neurologic: sleeping  Intake/Output from previous day: 02/07 0701 - 02/08 0700 In: 2702.5 [P.O.:730; I.V.:1972.5] Out: 1536 [Urine:1535; Stool:1] Intake/Output this shift: Total I/O In: 150 [I.V.:150] Out: -   Lab Results:  Basename 10/10/11 1032 10/08/11 1754 10/08/11 1245  WBC 9.1 6.7 6.3  HGB 15.2 14.6 14.0  HCT 44.6 41.8 40.0  PLT 153 145* 156   BMET  Basename 10/10/11 1032 10/10/11 0520 10/09/11 2125  NA 140 141 141  K 3.5 3.3* 3.7  CL 103 104 104  CO2 24 25 25   GLUCOSE 167* 133* 101*  BUN 14 14 14   CREATININE 0.74 0.83 1.07  CALCIUM 9.9 9.5 9.9   LFT  Basename 10/10/11 0520  PROT 7.3  ALBUMIN 3.6  AST 18  ALT 11  ALKPHOS 45  BILITOT 0.9  BILIDIR --  IBILI --       Assessment / Plan: #! 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 #2 hematemesis at presentation- no evidence for any bleeding since admit, hgb very stable- r/o esophagitis, PUD,.  Will need EGD prior to discharge, continue PPI in the interim.  He has had some bradycardia today. Will plan to follow up next week,. Discussed with his  wife.     LOS: 3 days   Amy Esterwood  10/11/2011, 9:41 AM

## 2011-10-11 NOTE — Progress Notes (Signed)
I agree with the above documentation, including the assessment and plan. Will see early next week for consideration of EGD given presentation. No further evidence of active bleeding, stable Hgb

## 2011-10-11 NOTE — Progress Notes (Signed)
Occupational Therapy Treatment Patient Details Name: Carl [name] MRN: [id number] DOB: 1945/10/12 Today's Date: 10/11/2011  OT Assessment/Plan OT Assessment/Plan Comments on Treatment Session: Pt demonstrates progress this session however continues to be a strong rehab candidate due to decreased motor planning, decreased cognition demonstrated with ADLS, weakness to Rt side, decreased sensation in Rt side, decreased balance, and orthostatic BP affecting all ADLS OT Plan: Discharge plan remains appropriate OT Frequency: Min 2X/week Recommendations for Other Services: Rehab consult Follow Up Recommendations: Inpatient Rehab Equipment Recommended: Defer to next venue OT Goals Acute Rehab OT Goals OT Goal Formulation: With patient/family Time For Goal Achievement: 2 weeks ADL Goals Pt Will Perform Grooming: with set-up;Sitting, chair;Unsupported ADL Goal: Grooming - Progress: Progressing toward goals Pt Will Perform Upper Body Bathing: with set-up;Sitting, chair;Supported ADL Goal: Product manager - Progress: Progressing toward goals Pt Will Perform Lower Body Bathing: with mod assist;Sit to stand from chair;Sit to stand from bed;Supported ADL Goal: Lower Body Bathing - Progress: Progressing toward goals Pt Will Perform Upper Body Dressing: with set-up;Sitting, chair;Supported ADL Goal: Location manager Dressing - Progress: Progressing toward goals Pt Will Perform Lower Body Dressing: with mod assist;Sit to stand from chair;Sit to stand from bed;Supported ADL Goal: Lower Body Dressing - Progress: Progressing toward goals Pt Will Transfer to Toilet: with supervision;3-in-1;Ambulation ADL Goal: Toilet Transfer - Progress: Progressing toward goals Miscellaneous OT Goals Miscellaneous OT Goal #1: Pt will place 4 out 6 objects in container on first try without undershooting demonstrating improvement in motor planning as precursor to sink level ADLS OT Goal: Miscellaneous Goal #1 - Progress:  Progressing toward goals  OT Treatment Precautions/Restrictions  Precautions Precautions: Fall Required Braces or Orthoses: No Restrictions Weight Bearing Restrictions: No   ADL ADL Eating/Feeding: Performed;Minimal assistance;Other (comment) (from Wife- compensating with UE propped on bedside tray) Eating/Feeding Details (indicate cue type and reason): pt using Rt UE to self feed with compensatory strategies. Pt demonstrates UE deficits on Rt Side Where Assessed - Eating/Feeding: Bed level Grooming: Performed;Moderate assistance (shaving with electric shaver) Grooming Details (indicate cue type and reason): Pt shaving Lt side of face 90 % and neglecting Rt side of face only demonstrating 40% shave. Pt visually attending to face in mirror and reports completing shave and provided max question cues to allow for pt self correction. Pt continued to report full shave completion. Pt opening tooth paste and sqeezing tooth into the air. When provided questiong cue "where are you putting the toother paste?" Pt reports "it goes in the water" Pt provided tooth brush in visual field and pt continued to hold tooth paste out into the air. Pt required hand over hand and max cue to apply paste to tooth brush. Pt required support at elbow to brush teeth with Rt UE. Pt using neck rotation Rt and Lt with decreased elbow activation. Pt demonstrates deficits with grooming.  Where Assessed - Grooming: Sitting, chair;Supported Lower Body Bathing: Performed;Moderate assistance Lower Body Bathing Details (indicate cue type and reason): Pt with decreased attention to hygiene and quickly washing over area. Pt with apraxia with Rt UE use and washing lateral aspect of thigh with greater detail.  Where Assessed - Lower Body Bathing: Supine, head of bed up;Supported (washing peri area and tights due to odor. ) Upper Body Dressing: Performed;Moderate assistance Upper Body Dressing Details (indicate cue type and reason): don  gown as robe Where Assessed - Upper Body Dressing: Sitting, chair;Unsupported Equipment Used: Rolling walker Ambulation Related to ADLs: Pt ambulating ~100 ft in nursing  unit with decreased gait velocity and balance. pt required Min-mod A for ambulation. pt not scanning environment and walking a straight path. pt closing eyes to half mask at times during ambulation and provided max v/c to attend to task.  ADL Comments: Pt demonstrates deficits with motor planning, sequencing ADL tasks and balance while performing task. Pt is excellent rehab candidate and demonstrating progression toward goals. Mobility  Bed Mobility Bed Mobility: Yes Rolling Right: 5: Supervision (Mod v/c for Rt UE positioning. ) Rolling Right Details (indicate cue type and reason): Pt rolling onto Rt arm with decreased awareness and demonstrating decreased sensation. pt activating rt log roll with Lt side only Supine to Sit: 4: Min assist;HOB elevated (Comment degrees) (min guard) Supine to Sit Details (indicate cue type and reason): Pt activating Lt side of trunk (core) for supine <> sit. Pt long sitting and pulling on bed rail at foot board with Lt side to scoot toward EOB. Pt with Rt lean slightly with supine<>sit. Pt could benefit from facilitation to Rt side and increased bed mobility to Supervision level  Transfers Transfers: Yes Sit to Stand: 2: Max assist;With upper extremity assist;From bed (Simultaneous filing. User may not have seen previous data.) Sit to Stand Details (indicate cue type and reason): Pt sit<>Stand with orthostatic BP demonstration this session. Pt reports no symptoms during session. Pt static standing with min A. Pt provided verbal cue to march in place and then visual cue to help incitiate movement. (Simultaneous filing. User may not have seen previous data.) Stand to Sit: 4: Min assist;With upper extremity assist;To chair/3-in-1 (Simultaneous filing. User may not have seen previous data.) Stand to Sit  Details: (A) to control descend and sequence task (Simultaneous filing. User may not have seen previous data.) Exercises    End of Session OT - End of Session Equipment Utilized During Treatment: Gait belt Activity Tolerance: Patient tolerated treatment well Patient left: in chair;with call bell in reach;with family/visitor present Nurse Communication: Mobility status for transfers  Carl [name], Carl [name]  10/11/2011, 1:45 PM Pager: [phone]

## 2011-10-11 NOTE — Progress Notes (Signed)
Stroke Team Progress Note  HISTORY Carl [name] is an 66 y.o. male white presenting in the ER with sudden onset right hemiparesis and hematemesis. The patient is encephalopathic so most of the history is coming from his family. [name] tells me that the patient was found on the floor by his son earlier tonight around 10:45 pm. The patient had bowel incontinence and had vomited his dinner. [name] and wife tried to put the patient in the shower at which point he started vomiting blood. The family then called 911 and the patient was brought to the ER. The way to the ER and EMS vehicle the patient continued to have bloody vomit. His blood pressures were elevated in the ER and he was aphasic and able to are only a few words. He had right-sided facial droop as well as the right arm and leg weakness. The patient was unable at that point to hold his arm up. During the course of history taking and examination the patient Improved in strength and speech.  according to the patient's wife and son comment the patient takes about 10-20 Tylenol that day for chronic neck pain. He also had bloody vomit last year and was found to have 2 ulcers. However comment that time he did not have any asymmetric weakness normal with the vomiting is excessive.  The patient is a recovering alcoholic and has been going to [employer] The wife is not sure, however, if he has stopped drinking alcohol altogether  SUBJECTIVE The patient is sleepy, but will alert easily. The patient indicates that he is cold, and did not sleep well last night. Patient has had some problems with headache. The patient is eating well, and is taking his medications well. Nursing staff indicates that the patient is having problems with heart rates in the 40s overnight, but with stimulation, the heart rates will go back up to the 60s.  OBJECTIVE Filed Vitals:   10/11/11 0300 10/11/11 0400 10/11/11 0500 10/11/11 0600  BP: 158/84 157/88 152/86  158/77  Pulse: 60 56 57 54  Temp:      TempSrc:      Resp: 17  19 18   Height:      Weight:      SpO2: 98%  98% 98%    CBG (last 3) No results found for this basename: GLUCAP:3 in the last 72 hours Intake/Output from previous day: 02/07 0701 - 02/08 0700 In: 2702.5 [P.O.:730; I.V.:1972.5] Out: 1536 [Urine:1535; Stool:1]  IV Fluid Intake:      . niCARDipine Stopped (10/10/11 1136)   Medications     . tenormin  50 mg Oral BID  . cloNIDine  0.1 mg Oral BID  . doxazosin  8 mg Oral QHS  . folic acid  1 mg Intravenous Daily  . hydrALAZINE  100 mg Oral TID  . losartan  50 mg Oral Daily  . pantoprazole (PROTONIX) IV  40 mg Intravenous Q12H  . potassium chloride  20 mEq Oral BID  . sodium chloride 0.9 % 1,000 mL with multivitamins adult (MVI -12) 10 mL, potassium chloride 20 mEq infusion   Intravenous Q24H  . sodium chloride 0.9 % 1,000 mL with potassium chloride 20 mEq infusion   Intravenous Q24H  . thiamine  100 mg Intravenous Daily  . DISCONTD: atenolol  100 mg Oral BID  PRN acetaminophen, acetaminophen, food thickener, labetalol, LORazepam, ondansetron (ZOFRAN) IV  Diet:  Dysphagia 3 nectar thick liquids Activity:  Bedrest  DVT Prophylaxis:  SCDs   Significant Diagnostic Studies: CBC    Component Value Date/Time   WBC 9.1 10/10/2011 1032   RBC 4.66 10/10/2011 1032   HGB 15.2 10/10/2011 1032   HCT 44.6 10/10/2011 1032   PLT 153 10/10/2011 1032   MCV 95.7 10/10/2011 1032   MCH 32.6 10/10/2011 1032   MCHC 34.1 10/10/2011 1032   RDW 13.7 10/10/2011 1032   LYMPHSABS 0.5* 10/08/2011 0044   MONOABS 0.5 10/08/2011 0044   EOSABS 0.0 10/08/2011 0044   BASOSABS 0.0 10/08/2011 0044   CMP    Component Value Date/Time   NA 140 10/10/2011 1032   K 3.5 10/10/2011 1032   CL 103 10/10/2011 1032   CO2 24 10/10/2011 1032   GLUCOSE 167* 10/10/2011 1032   BUN 14 10/10/2011 1032   CREATININE 0.74 10/10/2011 1032   CALCIUM 9.9 10/10/2011 1032   PROT 7.3 10/10/2011 0520   ALBUMIN 3.6 10/10/2011 0520   AST 18 10/10/2011  0520   ALT 11 10/10/2011 0520   ALKPHOS 45 10/10/2011 0520   BILITOT 0.9 10/10/2011 0520   GFRNONAA >90 10/10/2011 1032   GFRAA >90 10/10/2011 1032   COAGS Lab Results  Component Value Date   INR 0.98 10/08/2011   Lipid Panel    Component Value Date/Time   CHOL 211* 01/15/2007 1027   TRIG 303* 01/15/2007 1027   HDL 38.3* 01/15/2007 1027   CHOLHDL 5.5 CALC 01/15/2007 1027   VLDL 61* 01/15/2007 1027   HgbA1C  Lab Results  Component Value Date   HGBA1C 5.6 11/04/2007   Urine Drug Screen     Component Value Date/Time   LABOPIA NONE DETECTED 10/08/2011 0245   COCAINSCRNUR NONE DETECTED 10/08/2011 0245   LABBENZ NONE DETECTED 10/08/2011 0245   AMPHETMU NONE DETECTED 10/08/2011 0245   THCU NONE DETECTED 10/08/2011 0245   LABBARB NONE DETECTED 10/08/2011 0245    Alcohol Level No results found for this basename: eth  Ct Head Wo Contrast  CT of the brain    10/09/2011  No change in the left thalamic hematoma with intraventricular penetration.  Axial measurement of 30 x 18 mm.  10/08/2011   Stable appearance of large left thalamic hemorrhagic infarct with intra-articular extension of hemorrhage. No significant interval change.   10/08/2011 Left thalamic/basal ganglia intraparenchymal hemorrhage, with extension into the ventricular system.   MRI of the brain    MRA of the brain    2D Echocardiogram  EF 55-60% with no source of embolus.   Carotid Doppler  No internal carotid artery stenosis bilaterally. Vertebrals with antegrade flow bilaterally.   CXR   10/08/2011  1.  Low volumes with mild bilateral edema or infiltrates.    EKG  sinus rhythm   Physical Exam    The patient is sleepy, but can be aroused.  The patient has a dysarthric speech pattern, no aphasia.  Respiratory examination is clear.  Cardiovascular examination shows a regular rate and rhythm, no obvious murmurs or rubs.  Abdomen is obese, positive bowel sounds, nontender.  Extremities are without significant edema.  The patient  appears to have a depression of the right nasolabial fold. Extraocular movements are full, visual fields are full.  The patient has mild weakness of the right arm greater than right leg, drift of the right upper extremity. Patient has normal strength of the left side.  The patient has some clumsiness with finger-nose-finger with the right arm, normal in the left. The patient is able to perform toe to finger  bilaterally.  Deep tendon reflexes are depressed but symmetric.  The patient was not ambulated.  ASSESSMENT Mr. NEWELL WAFER is a 66 y.o. 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 hemorrhage secondary to hypertension. Discussed case with Dr. Gerlene Fee who wants to know if he decompensates.   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 Drips stopped. Recommend endo prior to discharge -hypokalemia, 3.3 this am -dyslipidemia -etoh abuse -hypertension, on iv cardene -chronic pain with excessive tylenol use CT of the head that was done yesterday appears to be quite stable. Clinical exam appears to be stable. The patient should be an excellent rehabilitation candidate.  Hospital day # 3  TREATMENT/PLAN -Continue home medications for blood pressure -Reduce atenolol to 50 mg twice daily secondary to bradycardia -OOB. Therapy evals. -Kcl replacement, potassium is now normal  -Transferred to floor -Rehabilitation consult  Joaquin Music, ANP-BC, GNP-BC [facility] Pager: [phone] 10/11/2011 7:56 AM  Dr. Lesia Sago has personally reviewed chart, pertinent data, examined the patient and developed the plan of care.  Lesly Dukes

## 2011-10-12 ENCOUNTER — Inpatient Hospital Stay (HOSPITAL_COMMUNITY): Payer: Medicare Other

## 2011-10-12 LAB — URINE MICROSCOPIC-ADD ON

## 2011-10-12 LAB — URINALYSIS, ROUTINE W REFLEX MICROSCOPIC
Bilirubin Urine: NEGATIVE
Hgb urine dipstick: NEGATIVE
Ketones, ur: NEGATIVE mg/dL
Nitrite: POSITIVE — AB
Specific Gravity, Urine: 1.02 (ref 1.005–1.030)
pH: 7 (ref 5.0–8.0)

## 2011-10-12 LAB — CBC
HCT: 41.3 % (ref 39.0–52.0)
MCHC: 34.4 g/dL (ref 30.0–36.0)
RDW: 12.8 % (ref 11.5–15.5)

## 2011-10-12 LAB — VITAMIN B12: Vitamin B-12: 286 pg/mL (ref 211–911)

## 2011-10-12 LAB — GLUCOSE, CAPILLARY: Glucose-Capillary: 125 mg/dL — ABNORMAL HIGH (ref 70–99)

## 2011-10-12 MED ORDER — LORAZEPAM 2 MG/ML IJ SOLN
1.0000 mg | INTRAMUSCULAR | Status: DC | PRN
  Administered 2011-10-13 (×2): 1 mg via INTRAVENOUS
  Filled 2011-10-12: qty 1

## 2011-10-12 MED ORDER — HYDRALAZINE HCL 20 MG/ML IJ SOLN
10.0000 mg | Freq: Four times a day (QID) | INTRAMUSCULAR | Status: DC | PRN
  Administered 2011-10-12 – 2011-10-15 (×6): 10 mg via INTRAVENOUS
  Filled 2011-10-12 (×2): qty 0.5
  Filled 2011-10-12: qty 1
  Filled 2011-10-12 (×3): qty 0.5

## 2011-10-12 MED ORDER — LORAZEPAM 2 MG/ML IJ SOLN
1.0000 mg | Freq: Once | INTRAMUSCULAR | Status: AC
  Administered 2011-10-12: 1 mg via INTRAMUSCULAR

## 2011-10-12 MED ORDER — DIPHENHYDRAMINE HCL 50 MG/ML IJ SOLN
25.0000 mg | Freq: Four times a day (QID) | INTRAMUSCULAR | Status: DC | PRN
  Administered 2011-10-13: 25 mg via INTRAVENOUS
  Filled 2011-10-12: qty 1

## 2011-10-12 MED ORDER — LORAZEPAM 2 MG/ML IJ SOLN
1.0000 mg | INTRAMUSCULAR | Status: DC | PRN
  Administered 2011-10-12: 2 mg via INTRAMUSCULAR
  Administered 2011-10-12 (×2): 1 mg via INTRAMUSCULAR
  Filled 2011-10-12 (×3): qty 1

## 2011-10-12 NOTE — Progress Notes (Addendum)
Pt continues to be without IV access or tele monitoring, paged Dr several times with no response, pt is combative and completely restless. Charge nurse made aware as well as rapid response of patients situation. Pt current BP 207/113

## 2011-10-12 NOTE — Progress Notes (Signed)
Patient agitated.  Received IM ativan last evening that calmed the patient down but has now worn off and patient is again agitated.  BP elevated as well at 207/113.  Nursing attempted to contact me but due to inaccuracies in AMION they did not have the correct pager number and I was unable to be reached.  Rapid response nurse was called and she came to the call room and alerted me.  I responded to the floor at that time.  Patient sitting on the edge of the bed unclothed.  Will not allow me to dress him.  Has removed all access and monitoring.  Verbal responses inappropriate.  Will not answer questions concerning orientation.   Plan: 1.  Patient to have another dose of Ativan IM.  Is already scheduled to be able to receive ativan q 4h.  2.  After IM Ativan, po antihypertensive medications to be given and BP rechecked.  To call MD with this result.  3.  Order written for sitter at bedside.  Thana Farr, MD [facility] [phone]

## 2011-10-12 NOTE — Progress Notes (Signed)
OT Cancellation Note  Treatment cancelled today due to medical issues with patient which prohibited therapy. Pt extremely agitated and placed in restraints. Attempted to take pt out of restraints and pt starting pulling at lines. Restraints reapplied by RN. Will attempt therapy tomorrow pending improved cognition  10/12/2011 Cipriano Mile OTR/L Pager [phone] Office [phone]

## 2011-10-12 NOTE — Progress Notes (Signed)
Patient became restless and very agitated and pulled out all his IV lines, took his gown off and pulled his telemetry wires off. Patient refused for his VS to be taken, refused telemetry and pt. Only nods his head when asked for orientation questions. Wife made aware of pts behavior and stated that "I'm not surprised because he's been acting that way on and off even before." MD on call notified and received new order for IM ativan, continue to monitor pt.     Carl [name]

## 2011-10-12 NOTE — Progress Notes (Addendum)
Stroke Team Progress Note  HISTORY Carl [name] is an 66 y.o. male white presenting in the ER with sudden onset right hemiparesis and hematemesis. The patient is encephalopathic so most of the history is coming from his family. [name] tells me that the patient was found on the floor by his son earlier tonight around 10:45 pm. The patient had bowel incontinence and had vomited his dinner. [name] and wife tried to put the patient in the shower at which point he started vomiting blood. The family then called 911 and the patient was brought to the ER. The way to the ER and EMS vehicle the patient continued to have bloody vomit. His blood pressures were elevated in the ER and he was aphasic and able to are only a few words. He had right-sided facial droop as well as the right arm and leg weakness. The patient was unable at that point to hold his arm up. During the course of history taking and examination the patient Improved in strength and speech.  according to the patient's wife and son comment the patient takes about 10-20 Tylenol that day for chronic neck pain. He also had bloody vomit last year and was found to have 2 ulcers. However comment that time he did not have any asymmetric weakness normal with the vomiting is excessive.  The patient is a recovering alcoholic and has been going to [employer] The wife is not sure, however, if he has stopped drinking alcohol altogether.   SUBJECTIVE Pt lying in bed, tremulous.  Responds to questions with low, barely intelligible responses.  Does not participate enough to provide any meaningful answers.  Nursing notes reviewed and discussions held with nursing.  Events over night noted.  Pt received Ativan 1 mg about 20 min ago.  Generally, has been combative, restless, has pulled out all access lines.  OBJECTIVE Most recent Vital Signs: Temp: 98.2 F (36.8 C) (02/09 0502) Temp src: Oral (02/09 0502) BP: 207/113 mmHg (02/09 0502) Pulse  Rate: 68  (02/09 0502) Respiratory Rate: 22 O2 Saturation: 93%  CBG (last 3)  Basename 10/11/11 2152  GLUCAP 125*   Intake/Output from previous day: 02/08 0701 - 02/09 0700 In: 1093.8 [P.O.:780; I.V.:313.8] Out: 650 [Urine:650]  IV Fluid Intake:     . DISCONTD: niCARDipine Stopped (10/10/11 1136)   Medications:    . tenormin  50 mg Oral BID  . cloNIDine  0.1 mg Oral BID  . doxazosin  8 mg Oral QHS  . hydrALAZINE  100 mg Oral TID  . LORazepam  1 mg Intramuscular Once  . losartan  50 mg Oral Daily  . mulitivitamin with minerals  1 tablet Oral Daily  . pantoprazole  40 mg Oral Q1200  . potassium chloride  20 mEq Oral BID  . sodium chloride 0.9 % 1,000 mL with potassium chloride 20 mEq infusion   Intravenous Q24H  . DISCONTD: atenolol  100 mg Oral BID  . DISCONTD: folic acid  1 mg Intravenous Daily  . DISCONTD: pantoprazole (PROTONIX) IV  40 mg Intravenous Q12H  . DISCONTD: prenatal multivitamin  1 tablet Oral Daily  . DISCONTD: sodium chloride 0.9 % 1,000 mL with multivitamins adult (MVI -12) 10 mL, potassium chloride 20 mEq infusion   Intravenous Q24H  . DISCONTD: thiamine  100 mg Intravenous Daily  PRN acetaminophen, acetaminophen, food thickener, labetalol, LORazepam, ondansetron (ZOFRAN) IV, DISCONTD: LORazepam  Diet: Dysphagia 3 nectar thick liquids  Activity: Up with assistance DVT Prophylaxis:  SCDs   Mental Status: Somnolent, minimally cooperative, tremulous (extinguished with activity).  Aphasia persists.  Received Ativan 1 mg IM 20 min before exam. Cranial Nerves: II-unable to assess due to limited cooperation.  Blinks to confrontation. III/IV/VI-Extraocular movements intact.  Pupils  <2 mm,  reactive bilaterally. V/VII-flattening right nasolabial fold. VIII-grossly intact IX/X-normal gag XII-midline tongue extension Motor: 4+/5 RUE, 5/5 LUE.  5/5 bilateral LEs.  Slight increased tone on the right. Sensory: light touch grossly intact throughout Deep Tendon  Reflexes: 2+ and symmetric throughout Cerebellar:  Action tremor of hands on finger to nose.  Clumsy on the right.  Unable to perform RAM on the right.  Gait not assessed.   Labs: BASIC METABOLIC PANEL     Status: Abnormal   Collection Time   10/11/11 10:45 AM      Component Value Range Comment   Sodium 139  135 - 145 (mEq/L)    Potassium 3.8  3.5 - 5.1 (mEq/L)    Chloride 105  96 - 112 (mEq/L)    CO2 23  19 - 32 (mEq/L)    Glucose, Bld 129 (*) 70 - 99 (mg/dL)    BUN 16  6 - 23 (mg/dL)    Creatinine, Ser 9.56  0.50 - 1.35 (mg/dL)    Calcium 9.8  8.4 - 10.5 (mg/dL)    GFR calc non Af Amer >90  >90 (mL/min)    GFR calc Af Amer >90  >90 (mL/min)   CBC     Status: Normal   Collection Time   10/11/11 10:45 AM      Component Value Range Comment   WBC 9.6  4.0 - 10.5 (K/uL)    RBC 4.26  4.22 - 5.81 (MIL/uL)    Hemoglobin 13.9  13.0 - 17.0 (g/dL)    HCT 21.3  08.6 - 57.8 (%)    MCV 95.5  78.0 - 100.0 (fL)    MCH 32.6  26.0 - 34.0 (pg)    MCHC 34.2  30.0 - 36.0 (g/dL)    RDW 46.9  62.9 - 52.8 (%)    Platelets 156  150 - 400 (K/uL)   GLUCOSE, CAPILLARY     Status: Abnormal   Collection Time   10/11/11  9:52 PM      Component Value Range Comment   Glucose-Capillary 125 (*) 70 - 99 (mg/dL)    COAGS Lab Results  Component Value Date   INR 0.98 10/08/2011   Imaging: 10/11/2011  CT HEAD WITHOUT CONTRAST  Findings: Left thalamic intraparenchymal hematoma measures 3.1 x 1.7 cm and demonstrates mild surrounding edema.  This is without significant interval change allowing for differences in slice selection.  No evidence for additional bleeding.  Hemorrhage now layers dependently within the occipital horns of the lateral ventricles and is again noted to collect within the third ventricle and fourth ventricle.  Stable ventricular system size. There is a small amount of subarachnoid blood within sulci on the left, likely related to redistribution of the intraventricular blood.  No herniation.  Unchanged  white matter hypodensities.  No evidence for additional acute infarction.The visualized paranasal sinuses and mastoid air cells are predominately clear.  IMPRESSION: No significant interval change in the size of the left thalamic intraparenchymal hemorrhage, measuring 3.1 x 1.7 cm in transverse dimension.  Redistributing intraventricular blood, small amount now subarachnoid on the left.  No overt hydrocephalus.   Waneta Martins, M.D.   ASSESSMENT [name] is a 66 y.o. male with  1. 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 hemorrhage secondary to hypertension. Head CT 10/11/11 stable.    2. 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  CIR consult pending. 3. Accelerated HTN-  Trend shows progressive BP elevation beginning yesterday @ 1741: 168/102.  Now 208/100.   4. Agitation and combativeness:  ETOH withdrawal vs encephelopathy vs ICH extension- Receiving Ativan 1 mg Q 4 hrs, being placed in monitored room, sitter has been ordered as well as soft mechanical restraints if needed.  CIWA protocol initiated. 5. GIB secondary to ulcers, GI consulted, treating medically. Drips stopped. Recommend endo prior to discharge. H&H stable. 6. dyslipidemia- check levels  7. etoh abuse   8. chronic pain with excessive tylenol use   Hospital day # 4  TREATMENT/PLAN 1. Stat Head CT without contrast. 2. Replace IV.   3. Keep NPO until more alert- discussed with nursing.  May need to change meds to IV if it can be replaced. 4. CIWA protocol, monitored room, sitter has been ordered as well as soft mechanical restraints if needed. 5. Check FLP, A1C, TSH, B12  Case has been discussed with Dr. Marjory Lies by phone who agrees with above plan.  Marya Fossa PA-C [facility] [phone] 10/12/2011  7:52 AM  I evaluated and examined patient and agree with plan. Cierra Rothgeb

## 2011-10-12 NOTE — Progress Notes (Signed)
PT Cancellation Note  Treatment cancelled today due to medical issues with patient which prohibited therapy. Pt extremely agitated and placed in restraints. Attempted to take pt out of restraints and pt starting pulling at lines. Restraints reapplied by RN. Will attempt therapy tomorrow pending improved cognition  10/12/2011 Carl [name] DPT PAGER: [phone] OFFICE: [phone]   Carl [name] 10/12/2011, 4:06 PM

## 2011-10-12 NOTE — Progress Notes (Signed)
Pt now settled into a video monitor room. Pt's brother spoken to on the phone and updated to situation. Restraints applied to patient, who is twitching, restless, but cooperative. Verbally answers some orientation questions, ignores others. MAE. Pupils pinpoint and reactive. Speech slurred. Awaiting CT of head. IV now in place as well as telemetry. BP has gone down a little to 170s/100.  NPO until completely alert.  Will cont to monitor. Minor, Yvette Rack

## 2011-10-12 NOTE — Progress Notes (Signed)
10/12/11 Rounding Follow UP  Stat Head CT results show stable appearance of the left thalamic intraparenchymal hematoma. Ventricle volumes stable.  Pt remains agitated and requiring ativan prn and soft mechanical restraints.  CIWA protocol being followed.  BP stable at 131/86.  Low grade fever 100 F.    Check UA C&S, CXR, CBC. Otherwise, continue current management plan. Delice Bison Jermarcus Mcfadyen PA-C

## 2011-10-13 LAB — COMPREHENSIVE METABOLIC PANEL
AST: 17 U/L (ref 0–37)
BUN: 20 mg/dL (ref 6–23)
CO2: 26 mEq/L (ref 19–32)
Calcium: 10 mg/dL (ref 8.4–10.5)
Chloride: 100 mEq/L (ref 96–112)
Creatinine, Ser: 0.81 mg/dL (ref 0.50–1.35)
GFR calc Af Amer: >90 mL/min (ref >90)
GFR calc non Af Amer: >90 mL/min (ref >90)
Glucose, Bld: 123 mg/dL — ABNORMAL HIGH (ref 70–99)
Total Bilirubin: 1.1 mg/dL (ref 0.3–1.2)

## 2011-10-13 LAB — LIPID PANEL
Cholesterol: 166 mg/dL (ref 0–200)
HDL: 62 mg/dL (ref >39)
Triglycerides: 78 mg/dL (ref <150)

## 2011-10-13 LAB — HEMOGLOBIN A1C
Hgb A1c MFr Bld: 5.3 % (ref <5.7)
Mean Plasma Glucose: 105 mg/dL (ref <117)

## 2011-10-13 LAB — TSH: TSH: 0.348 u[IU]/mL — ABNORMAL LOW (ref 0.350–4.500)

## 2011-10-13 MED ORDER — DEXTROSE 5 % IV SOLN
1.0000 g | INTRAVENOUS | Status: DC
  Administered 2011-10-13 – 2011-10-16 (×4): 1 g via INTRAVENOUS
  Filled 2011-10-13 (×5): qty 10

## 2011-10-13 MED ORDER — LORAZEPAM 2 MG/ML IJ SOLN: 0.0000 mg | Freq: Two times a day (BID) | INTRAMUSCULAR | Status: DC

## 2011-10-13 MED ORDER — THIAMINE HCL 100 MG/ML IJ SOLN
100.0000 mg | Freq: Every day | INTRAMUSCULAR | Status: DC
  Administered 2011-10-14: 100 mg via INTRAVENOUS
  Filled 2011-10-13 (×3): qty 1

## 2011-10-13 MED ORDER — LORAZEPAM 1 MG PO TABS: 1.0000 mg | ORAL_TABLET | Freq: Four times a day (QID) | ORAL | Status: DC | PRN

## 2011-10-13 MED ORDER — NICARDIPINE HCL IN NACL 20-0.86 MG/200ML-% IV SOLN
5.0000 mg/h | INTRAVENOUS | Status: DC
  Administered 2011-10-13: 7 mg/h via INTRAVENOUS
  Administered 2011-10-13: 5 mg/h via INTRAVENOUS
  Administered 2011-10-13 (×2): 7 mg/h via INTRAVENOUS
  Administered 2011-10-14 (×3): 5 mg/h via INTRAVENOUS
  Filled 2011-10-13 (×12): qty 200

## 2011-10-13 MED ORDER — LORAZEPAM 2 MG/ML IJ SOLN
1.0000 mg | Freq: Four times a day (QID) | INTRAMUSCULAR | Status: DC | PRN
  Filled 2011-10-13 (×3): qty 1

## 2011-10-13 MED ORDER — FOLIC ACID 1 MG PO TABS
1.0000 mg | ORAL_TABLET | Freq: Every day | ORAL | Status: DC
  Administered 2011-10-14 – 2011-10-21 (×7): 1 mg via ORAL
  Filled 2011-10-13 (×8): qty 1

## 2011-10-13 MED ORDER — PANTOPRAZOLE SODIUM 40 MG IV SOLR
40.0000 mg | Freq: Every day | INTRAVENOUS | Status: DC
  Administered 2011-10-13: 40 mg via INTRAVENOUS
  Filled 2011-10-13 (×2): qty 40

## 2011-10-13 MED ORDER — VITAMIN B-1 100 MG PO TABS
100.0000 mg | ORAL_TABLET | Freq: Every day | ORAL | Status: DC
  Administered 2011-10-15 – 2011-10-21 (×6): 100 mg via ORAL
  Filled 2011-10-13 (×8): qty 1

## 2011-10-13 MED ORDER — LORAZEPAM 2 MG/ML IJ SOLN
0.0000 mg | Freq: Four times a day (QID) | INTRAMUSCULAR | Status: DC
  Administered 2011-10-14 (×2): 2 mg via INTRAVENOUS
  Administered 2011-10-14: 4 mg via INTRAVENOUS
  Administered 2011-10-15: 2 mg via INTRAVENOUS
  Filled 2011-10-13: qty 2

## 2011-10-13 MED ORDER — ADULT MULTIVITAMIN W/MINERALS CH
1.0000 | ORAL_TABLET | Freq: Every day | ORAL | Status: DC
  Administered 2011-10-14 – 2011-10-21 (×7): 1 via ORAL
  Filled 2011-10-13 (×9): qty 1

## 2011-10-13 NOTE — Progress Notes (Addendum)
. Stroke Team Progress Note  HISTORY Carl [name] is an 66 y.o. male white presenting in the ER with sudden onset right hemiparesis and hematemesis. The patient is encephalopathic so most of the history is coming from his family. [name] tells me that the patient was found on the floor by his son earlier tonight around 10:45 pm. The patient had bowel incontinence and had vomited his dinner. [name] and wife tried to put the patient in the shower at which point he started vomiting blood. The family then called 911 and the patient was brought to the ER. The way to the ER and EMS vehicle the patient continued to have bloody vomit. His blood pressures were elevated in the ER and he was aphasic and able to are only a few words. He had right-sided facial droop as well as the right arm and leg weakness. The patient was unable at that point to hold his arm up. During the course of history taking and examination the patient Improved in strength and speech.  according to the patient's wife and son comment the patient takes about 10-20 Tylenol that day for chronic neck pain. He also had bloody vomit last year and was found to have 2 ulcers. However comment that time he did not have any asymmetric weakness normal with the vomiting is excessive.  The patient is a recovering alcoholic and has been going to [employer] The wife is not sure, however, if he has stopped drinking alcohol altogether  SUBJECTIVE Lying in bed, soft wrist and waist restraints in place, with eyes closed restlessly agitated.  Nurses following CIWA protocol.  Last dose ativan 0220.  BP remains accelerated, received labetalol 10 mg at 0047 and 0631, hydralazine 10 mg at 2334 and 0801.  Has been NPO since yesterday am due to somnolence.  OBJECTIVE Filed Vitals:   10/12/11 2330 10/13/11 0035 10/13/11 0114 10/13/11 0604  BP: 194/119 185/93 199/99 203/107  Pulse:  65 83 64  Temp:   98.2 F (36.8 C) 100 F (37.8 C)  TempSrc:    Axillary Axillary  Resp:   21 22  Height:      Weight:      SpO2:   91% 93%    CBG (last 3)   Basename 10/11/11 2152  GLUCAP 125*   Intake/Output from previous day: 02/09 0701 - 02/10 0700 In: -  Out: 1000 [Urine:1000]  IV Fluid Intake:    NaCl 0.9% with KCL 20 meq Medications     . tenormin  50 mg Oral BID  . cloNIDine  0.1 mg Oral BID  . doxazosin  8 mg Oral QHS  . hydrALAZINE  100 mg Oral TID  . losartan  50 mg Oral Daily  . mulitivitamin with minerals  1 tablet Oral Daily  . pantoprazole  40 mg Oral Q1200  . potassium chloride  20 mEq Oral BID  . sodium chloride 0.9 % 1,000 mL with potassium chloride 20 mEq infusion   Intravenous Q24H  PRN acetaminophen, acetaminophen, diphenhydrAMINE, food thickener, hydrALAZINE, labetalol, LORazepam, ondansetron (ZOFRAN) IV, DISCONTD: LORazepam  Diet:  NPO  Activity:  Bedrest  DVT Prophylaxis:  SCDs   Mental Status:  Somnolent, minimally cooperative, answers some questions with mumbled quiet words.  Falls asleep readily.  Poor attention and concentration.  Received Ativan 1 mg IM 6 hours ago.  Restless agitation persists.  Soft restraints in place.  Brother, Leonette Most, at bedside.  Cranial Nerves:  II-unable to assess  due to limited cooperation. Blinks to confrontation. Able to count 2 fingers. III/IV/VI-Extraocular movements grossly intact. Pupils <2 mm, reactive bilaterally.  V/VII-flattening right nasolabial fold.  VIII-grossly intact  IX/X-normal gag  XII-midline tongue extension  Motor: 4+/5 RUE, 5/5 LUE. 5/5 bilateral LEs. Slight increased tone on the right. Fair grip on the left.  Unable to grip on right. Sensory: light touch on left side of body triggers increased agitation. Deep Tendon Reflexes: 2+ and symmetric throughout  Cerebellar: not assessed.   Significant Diagnostic Studies: CBC    Component Value Date/Time   WBC 7.9 10/12/2011 1608   RBC 4.38 10/12/2011 1608   HGB 14.2 10/12/2011 1608   HCT 41.3 10/12/2011  1608   PLT 163 10/12/2011 1608   MCV 94.3 10/12/2011 1608   MCH 32.4 10/12/2011 1608   MCHC 34.4 10/12/2011 1608   RDW 12.8 10/12/2011 1608   LYMPHSABS 0.5* 10/08/2011 0044   MONOABS 0.5 10/08/2011 0044   EOSABS 0.0 10/08/2011 0044   BASOSABS 0.0 10/08/2011 0044   CMP    Component Value Date/Time   NA 139 10/11/2011 1045   K 3.8 10/11/2011 1045   CL 105 10/11/2011 1045   CO2 23 10/11/2011 1045   GLUCOSE 129* 10/11/2011 1045   BUN 16 10/11/2011 1045   CREATININE 0.76 10/11/2011 1045   CALCIUM 9.8 10/11/2011 1045   PROT 7.3 10/10/2011 0520   ALBUMIN 3.6 10/10/2011 0520   AST 18 10/10/2011 0520   ALT 11 10/10/2011 0520   ALKPHOS 45 10/10/2011 0520   BILITOT 0.9 10/10/2011 0520   GFRNONAA >90 10/11/2011 1045   GFRAA >90 10/11/2011 1045   COAGS Lab Results  Component Value Date   INR 0.98 10/08/2011   Lipid Panel    Component Value Date/Time   CHOL 166 10/12/2011 2331   TRIG 78 10/12/2011 2331   HDL 62 10/12/2011 2331   CHOLHDL 2.7 10/12/2011 2331   VLDL 16 10/12/2011 2331   LDLCALC 88 10/12/2011 2331    UA 10/12/11 Yellow Cloudy Sp. Gravity 1.020 Ph 7.0 Gluc neg Bili neg ketones neg Pro 100 Nitrite positive Leuk neg Wbc 3-6 Rbc 0 Bact few  Imaging:  10/11/2011 CT HEAD WITHOUT CONTRAST Findings: Left thalamic intraparenchymal hematoma measures 3.1 x 1.7 cm and demonstrates mild surrounding edema. This is without significant interval change allowing for differences in slice selection. No evidence for additional bleeding. Hemorrhage now layers dependently within the occipital horns of the lateral ventricles and is again noted to collect within the third ventricle and fourth ventricle. Stable ventricular system size. There is a small amount of subarachnoid blood within sulci on the left, likely related to redistribution of the intraventricular blood. No herniation. Unchanged white matter hypodensities. No evidence for additional acute infarction.The visualized paranasal sinuses and mastoid air cells are predominately clear.  IMPRESSION: No significant interval change in the size of the left thalamic intraparenchymal hemorrhage, measuring 3.1 x 1.7 cm in transverse dimension. Redistributing intraventricular blood, small amount now subarachnoid on the left. No overt hydrocephalus. Waneta Martins, M.D.  10/12/11 CT HEAD WITHOUT CONTRAST Comparison: 10/11/2011  Findings: Left thalamic parenchymal hematoma measures 3.0 x 2.4 cm, image 16.  Not significantly changed in size from previous exam. There is mild surrounding edema. Intraventricular hemorrhage is again noted within the third ventricle, occipital horns and fourth  ventricle. This is stable from previous examination. Stable ventricular volumes. No change in white matter hypodensities. No evidence for acute brain infarction or new areas of hemorrhage.  The  mastoid air cells are clear. Mild mucosal thickening involving the maxillary sinuses. The skull appears intact. IMPRESSION: 1. Stable appearance of the left thalamic intraparenchymal hematoma.  Rosealee Albee, M.D.  10/12/11 PORTABLE CHEST - 1 VIEW Comparison: 10/10/2011 Findings: Cardiac enlargement with mild vascular congestion. Negative for edema. Mild bibasilar atelectasis. No significant  effusion. IMPRESSION: Cardiac enlargement with vascular congestion. Mild bibasilar atelectasis, slightly improved from the prior study. Camelia Phenes, M.D.   ASSESSMENT Mr. JAWAN CHAVARRIA is a 66 y.o. male   1. Stable Left thalamic hemorrhage with extensiion into the ventricles, including the fourth. Hemorrhage secondary to hypertension. Head CT 10/12/11 stable.  2. 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 CIR consult pending.  3. Accelerated HTN- receiving IV labetalol and hydralazine.  Not adequately controlling. 4. Agitation and combativeness: ETOH withdrawal vs encephalopathy.  Receiving Ativan 1 mg Q 4 hrs, in monitored room, soft mechanical restraints. CIWA protocol.  5. UTI- will treat with IV  Rocephin 1 gm Q 24 hrs. When taking PO's, will need 14 days total treatment. 6. GIB secondary to ulcers, GI consulted, treating medically.  Recommend endo prior to discharge. H&H stable.  7. dyslipidemia- check levels  8. etoh abuse  9. chronic pain with excessive tylenol use  Hospital day # 4  TREATMENT/PLAN  1. Transfer patient to Unit Bed for management of accelerated HTN and persistent encephalopathy. 2. IV Cardene gtt. 3. IV Rocephin for UTI 4. Keep NPO until more alert. 5. IVF 6. BMP in am.  Check ammonia level.  Check EEG. 7. Resume thiamine, folate, MVI (IV)  Case has been discussed with Dr. Marjory Lies by phone who agrees with above plan.   Hospital day # 5  Marya Fossa PA-C [facility] [phone] 10/13/2011 8:06 AM  Dr. Marjory Lies has personally reviewed chart, pertinent data, examined the patient and developed the plan of care.  JERNEJCIC,TARA C  I evaluated and examined patient and agree with assessment and plan as above. Discussed with family.  PENUMALLI,VIKRAM 10/13/2011 11:27 AM

## 2011-10-13 NOTE — Progress Notes (Signed)
Patient ID: Carl [name], male   DOB: 23-Oct-1945, 66 y.o.   MRN: [id number] Transferring patient to unit due to accelerated HTN, unable to control.  Restless agitation.   Dr. Marjory Lies has seen and examined the patient and recommends transfer.

## 2011-10-13 NOTE — Progress Notes (Signed)
PT Cancellation Note  Treatment cancelled today due to medical issues with patient which prohibited therapy. Noted pt to transfer to ICU for increased monitoring. Not appropriate for PT today. Will continue to monitor pt's status for appropriateness for therapy to resume. Will need to be reassessed on next visit by primary PT.  Sallyanne Kuster 10/13/2011, 1:59 PM  Sallyanne Kuster, PTA Office- [phone] Pager- [phone]

## 2011-10-14 ENCOUNTER — Inpatient Hospital Stay (HOSPITAL_COMMUNITY): Payer: Medicare Other

## 2011-10-14 DIAGNOSIS — R569 Unspecified convulsions: Secondary | ICD-10-CM

## 2011-10-14 LAB — AMMONIA: Ammonia: 17 umol/L (ref 11–60)

## 2011-10-14 MED ORDER — ATENOLOL 50 MG PO TABS
50.0000 mg | ORAL_TABLET | Freq: Two times a day (BID) | ORAL | Status: DC
  Administered 2011-10-14 – 2011-10-21 (×13): 50 mg via ORAL
  Filled 2011-10-14 (×16): qty 1

## 2011-10-14 MED ORDER — HYDRALAZINE HCL 50 MG PO TABS
100.0000 mg | ORAL_TABLET | Freq: Three times a day (TID) | ORAL | Status: DC
  Administered 2011-10-14 – 2011-10-21 (×22): 100 mg via ORAL
  Filled 2011-10-14 (×24): qty 2

## 2011-10-14 MED ORDER — LOSARTAN POTASSIUM 50 MG PO TABS
50.0000 mg | ORAL_TABLET | Freq: Every day | ORAL | Status: DC
  Administered 2011-10-14 – 2011-10-21 (×8): 50 mg via ORAL
  Filled 2011-10-14 (×8): qty 1

## 2011-10-14 MED ORDER — DOXAZOSIN MESYLATE 8 MG PO TABS
8.0000 mg | ORAL_TABLET | Freq: Every day | ORAL | Status: DC
  Administered 2011-10-15 – 2011-10-20 (×6): 8 mg via ORAL
  Filled 2011-10-14 (×8): qty 1

## 2011-10-14 MED ORDER — CLONIDINE HCL 0.1 MG PO TABS
0.1000 mg | ORAL_TABLET | Freq: Two times a day (BID) | ORAL | Status: DC
  Administered 2011-10-14 – 2011-10-17 (×6): 0.1 mg via ORAL
  Filled 2011-10-14 (×8): qty 1

## 2011-10-14 MED ORDER — NICARDIPINE HCL IN NACL 20-0.86 MG/200ML-% IV SOLN
5.0000 mg/h | INTRAVENOUS | Status: DC
  Filled 2011-10-14: qty 200

## 2011-10-14 MED ORDER — PANTOPRAZOLE SODIUM 40 MG PO TBEC
40.0000 mg | DELAYED_RELEASE_TABLET | Freq: Every day | ORAL | Status: DC
  Administered 2011-10-14 – 2011-10-21 (×6): 40 mg via ORAL
  Filled 2011-10-14 (×5): qty 1

## 2011-10-14 NOTE — Progress Notes (Signed)
Physical Therapy Treatment Patient Details Name: Carl [name] MRN: [id number] DOB: Nov 10, 1945 Today's Date: 10/14/2011  PT Assessment/Plan  PT - Assessment/Plan Comments on Treatment Session: Patient is an excellent rehab candidate. He continues with decreased muscular endurance and cardiovascular endurance that limit function. PT Plan: Discharge plan remains appropriate;Frequency remains appropriate PT Frequency: Min 4X/week Recommendations for Other Services: Rehab consult Follow Up Recommendations: Inpatient Rehab Equipment Recommended: Defer to next venue PT Goals  Acute Rehab PT Goals PT Goal: Supine/Side to Sit - Progress: Progressing toward goal PT Goal: Sit to Supine/Side - Progress: Progressing toward goal PT Goal: Sit to Stand - Progress: Progressing toward goal PT Goal: Stand to Sit - Progress: Progressing toward goal PT Goal: Ambulate - Progress: Progressing toward goal  PT Treatment Precautions/Restrictions  Precautions Precautions: Fall Required Braces or Orthoses: No Restrictions Weight Bearing Restrictions: No Mobility (including Balance) Bed Mobility Supine to Sit: 5: Supervision;HOB elevated (Comment degrees) Supine to Sit Details (indicate cue type and reason): 30 degrees - increased time to complete task. Patient easily distracted by lines and leads and tries to reposition them Sitting - Scoot to Edge of Bed: 5: Supervision Sitting - Scoot to Edge of Bed Details (indicate cue type and reason): with upper extremity use. Sit to Supine: 5: Supervision Sit to Supine - Details (indicate cue type and reason): for safety only Scooting to [facility]: 4: Min assist Scooting to [facility] Details (indicate cue type and reason): with use of bridging technique to improve right lower extremity activation. Transfers Sit to Stand: 4: Min assist;With upper extremity assist;From bed Sit to Stand Details (indicate cue type and reason): Hand over hand for correct hand placement to initiate.  Facilitation for anterior weight shift and sequencing of hip and knee extension Stand to Sit: 4: Min assist;With upper extremity assist;To bed Stand to Sit Details: For control of descent and to enable improved hip flexion secondary to tendency for thrust posteriorly Ambulation/Gait Ambulation/Gait Assistance: 3: Mod assist Ambulation/Gait Assistance Details (indicate cue type and reason): Limited by fatigue, DOE 2./4 and increased HR to 140's briefly. With increased distance right knee buckles/ decreased foot clearance and patient requires increased assistance (from min-mod) to maintain balance and prevent falls. Verbal cues with gait for posture and safe navigation of walker. Patient needed occasional standing rest to reposition right hand on to walker.  Ambulation Distance (Feet): 120 Feet Assistive device: Rolling walker Gait Pattern: Decreased stance time - right;Decreased stride length;Right flexed knee in stance;Right foot flat;Decreased dorsiflexion - right  Posture/Postural Control Posture/Postural Control: Postural limitations Postural Limitations: Decreased ankle foot strategies and unable to manitain standing without support. Static Sitting Balance Static Sitting - Balance Support: Feet supported;No upper extremity supported Static Sitting - Level of Assistance: 7: Independent Exercise  Other Exercises Other Exercises: Bridging, hip flexion in hook lying with simultaneous dorsiflexion with resistance, slow controlled knee extension with resistance. x 10 each End of Session PT - End of Session Equipment Utilized During Treatment: Gait belt Activity Tolerance: Treatment limited secondary to medical complications (Comment);Patient limited by fatigue Patient left: in bed;with call bell in reach;with family/visitor present Nurse Communication: Mobility status for transfers;Mobility status for ambulation General Behavior During Session: [facility] for tasks performed Cognition:  Impaired Cognitive Impairment: Inconsistent following commands (2 step). Attention sustained for up to 1 minute, but easily distracted by lines and leads or will readjust PAS hose etc.   Edwyna Perfect, PT  Pager [phone]  10/14/2011, 10:14 AM

## 2011-10-14 NOTE — Progress Notes (Signed)
. Stroke Team Progress Note  HISTORY Carl [name] is an 66 y.o. male white presenting in the ER with sudden onset right hemiparesis and hematemesis. The patient is encephalopathic so most of the history is coming from his family. [name] tells me that the patient was found on the floor by his son earlier tonight around 10:45 pm. The patient had bowel incontinence and had vomited his dinner. [name] and wife tried to put the patient in the shower at which point he started vomiting blood. The family then called 911 and the patient was brought to the ER. The way to the ER and EMS vehicle the patient continued to have bloody vomit. His blood pressures were elevated in the ER and he was aphasic and able to are only a few words. He had right-sided facial droop as well as the right arm and leg weakness. The patient was unable at that point to hold his arm up. During the course of history taking and examination the patient Improved in strength and speech.  according to the patient's wife and son comment the patient takes about 10-20 Tylenol that day for chronic neck pain. He also had bloody vomit last year and was found to have 2 ulcers. However comment that time he did not have any asymmetric weakness normal with the vomiting is excessive.  The patient is a recovering alcoholic and has been going to [employer] The wife is not sure, however, if he has stopped drinking alcohol altogether  SUBJECTIVE Status post transfer to NICU on 10/13/11. More awake, alert this am. BP controled on cardene drip.  OBJECTIVE Filed Vitals:   10/14/11 0615 10/14/11 0630 10/14/11 0645 10/14/11 0700  BP: 156/81 144/96 156/84 157/93  Pulse: 89 105 119 99  Temp:      TempSrc:      Resp: 24 23  28   Height:      Weight:      SpO2:    95%    CBG (last 3)   Basename 10/11/11 2152  GLUCAP 125*   Intake/Output from previous day: 02/10 0701 - 02/11 0700 In: 2224.2 [I.V.:2214.2; IV Piggyback:10] Out:  1050 [Urine:1050]  IV Fluid Intake:     . niCARDipine 5 mg/hr (10/14/11 0700)  NaCl 0.9% with KCL 20 meq Medications    . cefTRIAXone (ROCEPHIN)  IV  1 g Intravenous Q24H  . folic acid  1 mg Oral Daily  . LORazepam  0-4 mg Intravenous Q6H   Followed by  . LORazepam  0-4 mg Intravenous Q12H  . mulitivitamin with minerals  1 tablet Oral Daily  . pantoprazole (PROTONIX) IV  40 mg Intravenous QHS  . sodium chloride 0.9 % 1,000 mL with potassium chloride 20 mEq infusion   Intravenous Q24H  . thiamine  100 mg Oral Daily   Or  . thiamine  100 mg Intravenous Daily  . DISCONTD: tenormin  50 mg Oral BID  . DISCONTD: cloNIDine  0.1 mg Oral BID  . DISCONTD: doxazosin  8 mg Oral QHS  . DISCONTD: hydrALAZINE  100 mg Oral TID  . DISCONTD: losartan  50 mg Oral Daily  . DISCONTD: mulitivitamin with minerals  1 tablet Oral Daily  . DISCONTD: pantoprazole  40 mg Oral Q1200  . DISCONTD: potassium chloride  20 mEq Oral BID  PRN acetaminophen, acetaminophen, diphenhydrAMINE, food thickener, hydrALAZINE, labetalol, LORazepam, LORazepam, ondansetron (ZOFRAN) IV, DISCONTD: LORazepam  Diet:  NPO  Activity:  Bedrest  DVT Prophylaxis:  SCDs  GENERAL EXAM: Patient is in no distress  CARDIOVASCULAR: TACHYCARDIA. No murmurs, no carotid bruits  NEUROLOGIC: MENTAL STATUS: awake, comfortable, more alert than yesterday. language fluent, comprehension intact CRANIAL NERVE: pupils equal (3-->2) and reactive to light, visual fields full to confrontation, extraocular muscles intact, no nystagmus, facial sensation and strength symmetric, uvula midline, shoulder shrug symmetric, tongue midline. MOTOR: normal bulk and tone; RUE 3/5; LUE 5/5. BLE 4/5.  SENSORY: normal and symmetric to light touch REFLEXES: deep tendon reflexes present and symmetric   Results for orders placed during the hospital encounter of 10/08/11 (from the past 24 hour(s))  COMPREHENSIVE METABOLIC PANEL     Status: Abnormal   Collection  Time   10/13/11  3:10 PM      Component Value Range   Sodium 136  135 - 145 (mEq/L)   Potassium 3.4 (*) 3.5 - 5.1 (mEq/L)   Chloride 100  96 - 112 (mEq/L)   CO2 26  19 - 32 (mEq/L)   Glucose, Bld 123 (*) 70 - 99 (mg/dL)   BUN 20  6 - 23 (mg/dL)   Creatinine, Ser 1.61  0.50 - 1.35 (mg/dL)   Calcium 09.6  8.4 - 10.5 (mg/dL)   Total Protein 7.6  6.0 - 8.3 (g/dL)   Albumin 3.7  3.5 - 5.2 (g/dL)   AST 17  0 - 37 (U/L)   ALT 16  0 - 53 (U/L)   Alkaline Phosphatase 39  39 - 117 (U/L)   Total Bilirubin 1.1  0.3 - 1.2 (mg/dL)   GFR calc non Af Amer >90  >90 (mL/min)   GFR calc Af Amer >90  >90 (mL/min)  AMMONIA     Status: Normal   Collection Time   10/14/11  5:00 AM      Component Value Range   Ammonia 17  11 - 60 (umol/L)   CT of the brain  10/12/2011 Stable appearance of the left thalamic intraparenchymal hematoma. 10/11/2011  No significant interval change in the size of the left thalamic intraparenchymal hemorrhage, measuring 3.1 x 1.7 cm in transverse dimension. Redistributing intraventricular blood, small amount now subarachnoid on the left. No overt hydrocephalus.  10/09/2011 No change in the left thalamic hematoma with intraventricular penetration. Axial measurement of 30 x 18 mm.  10/08/2011 Stable appearance of large left thalamic hemorrhagic infarct with intra-articular extension of hemorrhage. No significant interval change.  10/08/2011 Left thalamic/basal ganglia intraparenchymal hemorrhage, with extension into the ventricular system.  MRI of the brain  MRA of the brain  2D Echocardiogram EF 55-60% with no source of embolus.  Carotid Doppler No internal carotid artery stenosis bilaterally. Vertebrals with antegrade flow bilaterally.  CXR 10/08/2011 1. Low volumes with mild bilateral edema or infiltrates.  10/12/2011  Cardiac enlargement with vascular congestion. Mild bibasilar atelectasis, slightly improved from the prior study. EKG sinus rhythm  EEG   ASSESSMENT [name] is a  66 y.o. male   1. 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 Hemorrhage secondary to hypertension. Head CT 10/12/11 stable.  2. 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  3. Accelerated HTN- on cardene drip. NPO, off oral BP meds 4. Agitation and combativeness: ETOH withdrawal vs encephalopathy.  On CIWA protocol.  5. UTI - will treat with IV Rocephin 1 gm Q 24 hrs. When taking PO's, will need 14 days total treatment. 6. GIB secondary to ulcers, GI consulted, treating medically.  Recommend endo prior to discharge. H&H stable.  7. dyslipidemia- check levels  8. etoh abuse  9. chronic pain with excessive tylenol use   Hospital day # 6  TREATMENT/PLAN  -ST assess swallow, if ok, resume oral antihypertensives and wean cardene. -follow up EEG   Hospital day # [address] AVNP, ANP-BC, GNP-BC [facility] Pager: [phone] 10/14/2011 7:39 AM  Dr. Joycelyn Schmid has personally reviewed chart, pertinent data, examined the patient and developed the plan of care.

## 2011-10-14 NOTE — Progress Notes (Signed)
Nutrition Follow-up  Diet Order:  NPO to resume Dysphagia 3/Nectar  Meds: Scheduled Meds:   . cefTRIAXone (ROCEPHIN)  IV  1 g Intravenous Q24H  . folic acid  1 mg Oral Daily  . LORazepam  0-4 mg Intravenous Q6H   Followed by  . LORazepam  0-4 mg Intravenous Q12H  . mulitivitamin with minerals  1 tablet Oral Daily  . pantoprazole  40 mg Oral Q1200  . sodium chloride 0.9 % 1,000 mL with potassium chloride 20 mEq infusion   Intravenous Q24H  . thiamine  100 mg Oral Daily   Or  . thiamine  100 mg Intravenous Daily  . DISCONTD: tenormin  50 mg Oral BID  . DISCONTD: cloNIDine  0.1 mg Oral BID  . DISCONTD: doxazosin  8 mg Oral QHS  . DISCONTD: hydrALAZINE  100 mg Oral TID  . DISCONTD: losartan  50 mg Oral Daily  . DISCONTD: mulitivitamin with minerals  1 tablet Oral Daily  . DISCONTD: pantoprazole  40 mg Oral Q1200  . DISCONTD: pantoprazole (PROTONIX) IV  40 mg Intravenous QHS  . DISCONTD: potassium chloride  20 mEq Oral BID   Continuous Infusions:   . niCARDipine 5 mg/hr (10/14/11 1048)   PRN Meds:.acetaminophen, acetaminophen, diphenhydrAMINE, food thickener, hydrALAZINE, labetalol, LORazepam, LORazepam, ondansetron (ZOFRAN) IV, DISCONTD: LORazepam  Labs:  CMP     Component Value Date/Time   NA 136 10/13/2011 1510   K 3.4* 10/13/2011 1510   CL 100 10/13/2011 1510   CO2 26 10/13/2011 1510   GLUCOSE 123* 10/13/2011 1510   BUN 20 10/13/2011 1510   CREATININE 0.81 10/13/2011 1510   CALCIUM 10.0 10/13/2011 1510   PROT 7.6 10/13/2011 1510   ALBUMIN 3.7 10/13/2011 1510   AST 17 10/13/2011 1510   ALT 16 10/13/2011 1510   ALKPHOS 39 10/13/2011 1510   BILITOT 1.1 10/13/2011 1510   GFRNONAA >90 10/13/2011 1510   GFRAA >90 10/13/2011 1510   CBG (last 3)   Basename 10/11/11 2152  GLUCAP 125*    Intake/Output Summary (Last 24 hours) at 10/14/11 1127 Last data filed at 10/14/11 0700  Gross per 24 hour  Intake 2224.17 ml  Output   1050 ml  Net 1174.17 ml   Pt transferred to NICU 2/10 due  to HTN, currently on cardene drip. Ok to resume po diet/meds per SLP note. Prior to transfer to ICU pt eaing 75-10% of his Dysphagia 3/Nectar diet.   Weight Status:    203 lbs 2/11 202 lbs 2/7  Re-estimated needs:    Kcal:2075-2250 kcal  Protein:93-112 g  Fluid:2.8 L  Nutrition Dx:  Swallowing difficulty (NI-1.1). Status: Ongoing  Goal:  Patient will tolerate oral intake to meet 90-100% of estimated nutrition needs; not met  Intervention:  No nutrition interventions at this time. Diet per SLP. Will reassess PO intake and need for additional snacks or supplements.   Monitor:  Po intake, weight trends  Kendell Bane Cornelison Pager #:  [phone]

## 2011-10-14 NOTE — Progress Notes (Signed)
Speech Language/Pathology Speech Pathology: Dysphagia Treatment Note  Subjective:  Pt. In bed with nursing working with him, slightly restless, somewhat increased lethargy than last week.  Lung Sounds:  Temperature: 98.4  Objective:  Altered alertness, increased lethargy, and agitation/restlessness over weekend and pt. Was made NPO with orders to reassess ability to initiate po.s.  Pt. Given nectar thick juice as previously recommended from [facility] and graham cracker.  Pt. demonstrated functional oropharyngeal swallow ability.  He required max verbal cues to recall and perform double swallow strategy throughout session.     Recommendations:   Continue prior rec's from [facility]                                       1.  Dys 3 diet due to pt.'s slightly increased lethargy                                       2.  Nectar thick liquids                                       3.  Meds whole in applesauce                                       4.  Swallow two times after every other bite/sip                                       5.  Continued ST therapy  Pain:   none Intervention Required:   No   Goals: No Goals Met  Royce Macadamia M.Ed [employer] [phone]  10/14/2011

## 2011-10-14 NOTE — Progress Notes (Signed)
Occupational Therapy Treatment Patient Details Name: Carl [name] MRN: [id number] DOB: [birth year] Today's Date: 10/14/2011  OT Assessment/Plan OT Assessment/Plan Comments on Treatment Session: Pt demonstrates large changes in BP during this session and decreased balance. Pt describes Rt Ue as numb and painful. Pt positioned on towels and reports "oh thats better" pt using Rt Ue throughout session. pt with pain when wrist palpated. Pt sitting on Rt wrist during bed mobility. pt with decreased sensation in Rt UE . OT Plan: Discharge plan remains appropriate OT Frequency: Min 2X/week Recommendations for Other Services: Rehab consult Follow Up Recommendations: Inpatient Rehab Equipment Recommended: Defer to next venue OT Goals Acute Rehab OT Goals OT Goal Formulation: With patient/family Time For Goal Achievement: 2 weeks ADL Goals Pt Will Perform Grooming: with set-up;Sitting, chair;Unsupported ADL Goal: Grooming - Progress: Not met Pt Will Perform Upper Body Bathing: with set-up;Sitting, chair;Supported ADL Goal: Product manager - Progress: Not met Pt Will Perform Lower Body Bathing: with mod assist;Sit to stand from chair;Sit to stand from bed;Supported ADL Goal: Lower Body Bathing - Progress: Not met Pt Will Perform Upper Body Dressing: with set-up;Sitting, chair;Supported ADL Goal: Location manager Dressing - Progress: Not met Pt Will Perform Lower Body Dressing: with mod assist;Sit to stand from chair;Sit to stand from bed;Supported ADL Goal: Lower Body Dressing - Progress: Not met Pt Will Transfer to Toilet: with supervision;3-in-1;Ambulation ADL Goal: Toilet Transfer - Progress: Not met Miscellaneous OT Goals Miscellaneous OT Goal #1: Pt will place 4 out 6 objects in container on first try without undershooting demonstrating improvement in motor planning as precursor to sink level ADLS OT Goal: Miscellaneous Goal #1 - Progress: Not met  OT Treatment Precautions/Restrictions    Precautions Precautions: Fall Required Braces or Orthoses: No Restrictions Weight Bearing Restrictions: No   ADL ADL Eating/Feeding: Performed;Moderate assistance Eating/Feeding Details (indicate cue type and reason): Pt with decreased grab on spoon in right hand. Pt with tremors holding apple sauce in Lt UE and Rt Ue with spoon. Pt required prolonged time to place apple sauce on spoon. Pt required hand over hand to perform task Where Assessed - Eating/Feeding: Bed level (unsupported) Ambulation Related to ADLs: not attempted due to impulsive behavior and buckling with sit<>Stand. Pt side stepped to hob with x4 steps. pt buckling x2 LOB Max A to correct and returning to sitting x1.  ADL Comments: Pt impulsive this session and easily agitated. pt reaching for cords and for floor tiles. Pt using Lt side to facilitatie bed mobility. Pt scooting Rt side toward EOB and requried MOd A to remain @ the EOB. Pt able to correct bed position using Lt side but unaware of near fall position at EOB. Pt BP limiting this session , impulsive behavior and poor balance. Pt scooting to foot board and resting ahead foot board with Lt side for stability. Pt sitting EOB ~ 15 minutes with Min Guard to Mod A. Mobility  Bed Mobility Bed Mobility: Yes Rolling Right: Other (comment) (min guard facilitating mobility with Lt side only) Right Sidelying to Sit: 4: Min assist;With rails;HOB elevated (comment degrees) (25 degrees) Right Sidelying to Sit Details (indicate cue type and reason): pt grabbing Lt bed rail pulling self into sitting then reaching for foot rest handle. pt sliding self toward foot of bed with bed rails for balance. Pt with Rt hip positioned further back on the bed with Lt foot on the floor. pt required cues to square hips at EOB and pt over compensating with Mod tactile cue  to stop scooting. Supine to Sit: Other (comment);HOB flat;With rails (min guard) Supine to Sit Details (indicate cue type and  reason): 30 degrees - increased time to complete task. Patient easily distracted by lines and leads and tries to reposition them Sitting - Scoot to Edge of Bed: 5: Supervision Sitting - Scoot to Edge of Bed Details (indicate cue type and reason): with upper extremity use. Sit to Supine: 5: Supervision Sit to Supine - Details (indicate cue type and reason): for safety only Scooting to [facility]: 4: Min assist Scooting to [facility] Details (indicate cue type and reason): with use of bridging technique to improve right lower extremity activation. Transfers Transfers: Yes Sit to Stand: 2: Max assist;With upper extremity assist;From bed Sit to Stand Details (indicate cue type and reason): Pt standing with buckling LE and returned to sitting. pt reattempting with Max A and reaching for unsafe environmental supports Stand to Sit: 4: Min assist;With upper extremity assist;To bed Stand to Sit Details: For control of descent and to enable improved hip flexion secondary to tendency for thrust posteriorly Exercises Other Exercises Other Exercises: Bridging, hip flexion in hook lying with simultaneous dorsiflexion with resistance, slow controlled knee extension with resistance. x 10 each  End of Session OT - End of Session Equipment Utilized During Treatment: Gait belt (pt don gait belt by clipping it together min v/c only) Activity Tolerance: Treatment limited secondary to medical complications (Comment) (BP uncontrolled pt without symptoms) Patient left: in bed;with call bell in reach;with family/visitor present (brother) General Behavior During Session: [facility] for tasks performed Cognition: Impaired Cognitive Impairment: inconsistently following commands. Pt responding to questions with inappropriate answers. Pt asked about weekend and pt discussed puppy. Pt reports today as "tuesday"   See vitals 10:55Am for BP during session  Mcguire, Gasparyan  10/14/2011, 1:14 PM Pager: [phone]

## 2011-10-14 NOTE — Progress Notes (Signed)
Agitation over the last few days.   Remains on Nicardipine drip. Remains in neuro ICU.  Remains on Protonix 40 IV BID.  No coffee grounds or melena.  Did not reexamine.  Hgb/crit stable. Head CT 2/9 with stable appearance of the left thalamic intraparenchymal  hematoma.   A/P:  Coffee ground emesis and melena at home before admission for acute hemorrhagic CVA. Hx bleeding nsaid induced ulcers in August 2011, no PPI therapy in at least 18 months PTA.  Given no ongoing bleeding, no plans for EGD until BP stabelizes.  Continue the IV Protonix.  Jennye Moccasin PA-C

## 2011-10-14 NOTE — Procedures (Signed)
EEG NUMBER:  [id number].  This routine EEG was requested in this 66 year old man, who had a left hemisphere intraparenchymal hemorrhage.  The purpose of this EEG is to look for any epileptiform activity.  There are no listed medications.  EEG was done with the patient awake and drowsy.  During periods of maximal wakefulness, a posterior dominant rhythm of 7-8 cycle per second was seen across the right hemisphere.  Background activities across right hemispheric composed of frontally-dominant beta activities.  Over the left hemisphere, a left well-regulated well sustained alpha rhythm was seen.  There was underlying intermittent delta range slowing, particularly seen in the left temporal region, but also extending to the left paramedian region.  There was an absence of the beta activities over that side.  Photic stimulation and hyperventilation were not performed.    The patient did seem to become drowsy as evidenced by attenuation of the alpha rhythm as well as muscle activities and  the onset of slow roving eye movements.  During this time, there were bursts of slower activities. Throughout the recording, there was noted to be non-stereotyped head movement and leg movement.  There was also noted to be eye twitching.  There was no electrographic correlate to suggest that these movements were seizures.  CLINICAL INTERPRETATION:  This routine EEG done with the patient awake and drowsy is abnormal.  Background activitiy across the right hemisphere is slightly too slow suggesting a mild-to-moderate encephalopathy of nonspecific etiology.  The additional slowing across the left hemisphere in the delta range with paucity of faster activity suggest an underlying structural or functional lesion.  The non-stereotyped face, arm, and leg movements do not have an electrographic correlate.  However, it is possible that a focal motor seizure's brain surface area involvement is too small to be  demonstrated on scalp EEG.  Clinical correlation is advised.          ______________________________ Denton Meek, MD    [id number] D:  10/14/2011 22:12:39  T:  10/14/2011 22:37:37  Job #:  [id number]

## 2011-10-14 NOTE — Progress Notes (Signed)
I have taken an interval history, reviewed the chart and examined the patient. I agree with the extender's note, impression and recommendations. Consider EGD when BP stabilized on oral medications. He is able to take PO now. Continue a PPI long term.   Venita Lick. Russella Dar MD Clementeen Graham

## 2011-10-14 NOTE — Progress Notes (Signed)
Utilization review completed. Andreus Cure, RN, BSN. 10/14/11  

## 2011-10-14 NOTE — Progress Notes (Signed)
Clinical Social Worker received a consult for "substance abuse." Pt is unable to be assessment for substance abuse at this time. Pt's RN contacted CSW because ptt's family had concerns around discharge planning. CSW introduced herself and explained role of social work. CSW provided psychosocial support around to the family. CSW will meet with pt's son to address discharge planning process. CSW will complete brief psychosocial assessment. CSW will continue to follow to provide psychosocial support, as well as addressing discharge planning and substance abuse concerns.   Dede Query, MSW, Theresia Majors [phone]

## 2011-10-15 ENCOUNTER — Inpatient Hospital Stay (HOSPITAL_COMMUNITY): Payer: Medicare Other

## 2011-10-15 DIAGNOSIS — I10 Essential (primary) hypertension: Secondary | ICD-10-CM

## 2011-10-15 DIAGNOSIS — F10239 Alcohol dependence with withdrawal, unspecified: Secondary | ICD-10-CM

## 2011-10-15 LAB — BASIC METABOLIC PANEL
Chloride: 106 mEq/L (ref 96–112)
GFR calc Af Amer: >90 mL/min (ref >90)
Potassium: 3.8 mEq/L (ref 3.5–5.1)
Sodium: 142 mEq/L (ref 135–145)

## 2011-10-15 LAB — CBC
Platelets: 212 10*3/uL (ref 150–400)
RBC: 4.15 MIL/uL — ABNORMAL LOW (ref 4.22–5.81)
RDW: 12.8 % (ref 11.5–15.5)
WBC: 10.2 10*3/uL (ref 4.0–10.5)

## 2011-10-15 NOTE — Progress Notes (Signed)
I have taken an interval history, reviewed the chart and examined the patient. I agree with the extender's note, impression and recommendations. Tentatively plan for EGD tomorrow if neurologic status, blood pressure and other parameters are stable. Awaiting repeat head CT.  Venita Lick. Russella Dar MD Clementeen Graham

## 2011-10-15 NOTE — Progress Notes (Signed)
CSW met with pt and pt's son to address discharge to SNF. Pt was unable to remain awake. CSW explained process of SNF and what services would be provided. Pt's son shared that he feels that SNF would be a good option at discharge. Pt's son will be communicating this with pt's wife. CSW will initiate SNF search in [facility] and follow up with bed offers. CSW will continue to follow.   Dede Query, MSW, Theresia Majors [phone]

## 2011-10-15 NOTE — Progress Notes (Signed)
Speech Language/Pathology Speech Pathology: Dysphagia Treatment Note  Subjective:  Pt. very sleepy earlier today (pt. Received Ativan dose).  RN stated pt. more arousable this afternoon.  Objective:  Pt. seen for dysphagia treatment with lunch tray (dys 3, nectar thick liquids).  Required max verbal/tactile cues to arouse.  Pt. exhibited increased delayed swallow initiation, decreased lingual manipulation, slow mastication and propulsion to posterior oral cavity.  Max verbal reminders to swallow twice, and clear throat.  He appeared to have poor awareness of bolus with likely premature spill or delay to pharynx leading to coughing x 2 likely due to decreased alertness.  Session was ceased at that time.  Pt.'s daughter present and provided education regarding pt.'s dysphagia and safe swallow strategies.     Lung Sounds:  Upper clear, lower wheeze Temperature: 99.5  Clinical Impression:  Pt. Currently too lethargic for po's at this time as evidenced by signs of decreased tracheal protection (see above)    Recommendations:  Continue Dys 3, nectar thick liquids, safe swallow strategies when pt. Alert.   Pain:   none Intervention Required:   No   Goals: No Goals Met  Carl [name] [place] M.Ed [employer] [phone]  10/15/2011

## 2011-10-15 NOTE — Progress Notes (Signed)
Rehab admissions - Evaluated for possible admission.  I called and spoke with wife.  Wife works and has no one to provide supervision for patient after a potential rehab stay.  Wife to think about who could provide supervision while she works and let me know today.  It may be that patient will need SNF if family cannot provide needed supervision after discharge.  Pager [phone]

## 2011-10-15 NOTE — Progress Notes (Signed)
. Stroke Team Progress Note  HISTORY Carl [name] is an 66 y.o. male white presenting in the ER with sudden onset right hemiparesis and hematemesis. The patient is encephalopathic so most of the history is coming from his family. [name] tells me that the patient was found on the floor by his son earlier tonight around 10:45 pm. The patient had bowel incontinence and had vomited his dinner. [name] and wife tried to put the patient in the shower at which point he started vomiting blood. The family then called 911 and the patient was brought to the ER. The way to the ER and EMS vehicle the patient continued to have bloody vomit. His blood pressures were elevated in the ER and he was aphasic and able to are only a few words. He had right-sided facial droop as well as the right arm and leg weakness. The patient was unable at that point to hold his arm up. During the course of history taking and examination the patient Improved in strength and speech.  according to the patient's wife and son comment the patient takes about 10-20 Tylenol that day for chronic neck pain. He also had bloody vomit last year and was found to have 2 ulcers. However comment that time he did not have any asymmetric weakness normal with the vomiting is excessive.  The patient is a recovering alcoholic and has been going to [employer] The wife is not sure, however, if he has stopped drinking alcohol altogether  SUBJECTIVE Weaned off cardene yesterday after starting home po medications. Increased respiratory effort over night per RN, less responsive. Received ativan 10 mg since yesterday for CIWA withdrawal.  OBJECTIVE Filed Vitals:   10/15/11 0530 10/15/11 0600 10/15/11 0630 10/15/11 0700  BP: 178/94 176/100 177/100 175/99  Pulse: 76 83 80 79  Temp:    98.6 F (37 C)  TempSrc:    Oral  Resp:  31 38 41  Height:      Weight:      SpO2:  95%  94%    CBG (last 3)  No results found for this basename:  GLUCAP:3 in the last 72 hours Intake/Output from previous day: 02/11 0701 - 02/12 0700 In: 2667.3 [P.O.:480; I.V.:2087.3; IV Piggyback:100] Out: 2750 [Urine:2750]  IV Fluid Intake:     . niCARDipine Stopped (10/14/11 1327)  . DISCONTD: niCARDipine 5 mg/hr (10/14/11 1100)  \ Medications    . atenolol  50 mg Oral BID  . cefTRIAXone (ROCEPHIN)  IV  1 g Intravenous Q24H  . cloNIDine  0.1 mg Oral BID  . doxazosin  8 mg Oral QHS  . folic acid  1 mg Oral Daily  . hydrALAZINE  100 mg Oral TID  . LORazepam  0-4 mg Intravenous Q6H   Followed by  . LORazepam  0-4 mg Intravenous Q12H  . losartan  50 mg Oral Daily  . mulitivitamin with minerals  1 tablet Oral Daily  . pantoprazole  40 mg Oral Q1200  . sodium chloride 0.9 % 1,000 mL with potassium chloride 20 mEq infusion   Intravenous Q24H  . thiamine  100 mg Oral Daily   Or  . thiamine  100 mg Intravenous Daily  . DISCONTD: pantoprazole (PROTONIX) IV  40 mg Intravenous QHS  PRN acetaminophen, acetaminophen, diphenhydrAMINE, food thickener, hydrALAZINE, labetalol, LORazepam, LORazepam, ondansetron (ZOFRAN) IV  Diet:  Dysphagia 3 nectar thick liquids Activity:  None specified DVT Prophylaxis:  SCDs   GENERAL EXAM: Patient is  in no distress  CARDIOVASCULAR: TACHYCARDIA. No murmurs, no carotid bruits  NEUROLOGIC: MENTAL STATUS: SLEEPY, BARELY OPENS EYES TO VOICE. NO VERBAL. NOT FOLLOWING COMMANDS. CRANIAL NERVE: pupils equal (2-->1) and reactive to light, ROVING EYE MOVEMENTS; facial strength symmetric MOTOR: normal bulk and tone; RUE 1-2/5; LUE 4/5. BLE 4/5.  SENSORY: normal and symmetric to light touch REFLEXES: deep tendon reflexes present and symmetric   CT of the brain  10/12/2011 Stable appearance of the left thalamic intraparenchymal hematoma. 10/11/2011  No significant interval change in the size of the left thalamic intraparenchymal hemorrhage, measuring 3.1 x 1.7 cm in transverse dimension. Redistributing intraventricular  blood, small amount now subarachnoid on the left. No overt hydrocephalus.  10/09/2011 No change in the left thalamic hematoma with intraventricular penetration. Axial measurement of 30 x 18 mm.  10/08/2011 Stable appearance of large left thalamic hemorrhagic infarct with intra-articular extension of hemorrhage. No significant interval change.  10/08/2011 Left thalamic/basal ganglia intraparenchymal hemorrhage, with extension into the ventricular system.  MRI of the brain  MRA of the brain  2D Echocardiogram EF 55-60% with no source of embolus.  Carotid Doppler No internal carotid artery stenosis bilaterally. Vertebrals with antegrade flow bilaterally.  CXR 10/08/2011 1. Low volumes with mild bilateral edema or infiltrates.  10/12/2011  Cardiac enlargement with vascular congestion. Mild bibasilar atelectasis, slightly improved from the prior study. EKG sinus rhythm  EEG This routine EEG done with the patient awake  and drowsy is abnormal. Background activities across right hemisphere is slightly too slow suggesting a mild-to-moderate encephalopathy of nonspecific etiology. The additional slowing across the left hemisphere in the delta range with paucity of faster activity suggest an underlying structural or functional lesion. The non-stereotyped both face, arm, and leg movements do not have an electrographic correlate. However, it is possible that a focal motor seizures involve 2 small part of the brain to be demonstrated on scalp EEG   ASSESSMENT [name] is a 66 y.o. male  1. 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  2. 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  3. Accelerated HTN- off cardene drip. On oral BP meds 4. Agitation and combativeness: ETOH withdrawal vs UTI vs. ICH/IVH. On CIWA protocol.  5. UTI - will treat with IV Rocephin 1 gm Q 24 hrs. When taking PO's, will need 7 days total treatment. 6. GIB secondary to ulcers,  GI consulted, treating medically.  Recommend endo prior to discharge. H&H stable.  7. dyslipidemia- check levels  8. etoh abuse  9. chronic pain with excessive tylenol use  10. Tachypnea, ? Etiology, ? Aspiration pneumonia 11. Lethargic, ? Related to neuro worsening vs ativan (10mg  since yesterday this time)  Hospital day # 7  TREATMENT/PLAN  -CT head -CXR -BMET, CBC -reduce ativan/CIWA protocol ativan to prn only -clarify activity to up with assistance  Hospital day # [address], AVNP, ANP-BC, GNP-BC [facility] Pager: [phone] 10/15/2011 7:55 AM  Dr. Joycelyn Schmid has personally reviewed chart, pertinent data, examined the patient and developed the plan of care.    I evaluated and examined patient and agree with assessment and plan as above.  CT head (10/15/11) done and reviewed.  Left thalamic ICH stable. IVH has improved. No blood in 4th ventricle.  Therefore, pts agitation likely related to medication use (ativan), UTI and possible ongoing EtOH withdrawal. Will continue UTI tx and will start to wean ativan slowly.  Khary Schaben 10/15/2011 9:19 AM

## 2011-10-15 NOTE — Progress Notes (Signed)
PT Cancellation Note  Treatment cancelled today due to medical issues with patient which prohibited therapy. Patient with tachypnea in bed at rest this am up to 40's. Also with decreased level of arousal ? Due to medication (ativan).   Edwyna Perfect, PT  Pager [phone]  10/15/2011, 8:26 AM

## 2011-10-15 NOTE — Progress Notes (Signed)
     Carl [name] Daily Rounding Note 10/15/2011, 10:40 AM  SUBJECTIVE: Pt ok's for D 3 diet, nectar thicks, meds in applesauce.  PT notes tachypnea at rest, decreased arousal leading to cancellation of therapy session.  Another head CT ordered. No stools recorded.    OBJECTIVE: General: Resting, breathing rapidly.  Not diaphoretic.  Vital signs in last 24 hours: Temp:  [97.5 F (36.4 C)-99.4 F (37.4 C)] 98.6 F (37 C) (02/12 0700) Pulse Rate:  [40-133] 75  (02/12 1000) Resp:  [22-41] 31  (02/12 1000) BP: (119-187)/(68-114) 161/101 mmHg (02/12 1000) SpO2:  [91 %-96 %] 91 % (02/12 1000) Weight:  [204 lb 5.9 oz (92.7 kg)] 204 lb 5.9 oz (92.7 kg) (02/12 0500) Last BM Date: 10/10/11  Heart: RRR Chest: breathing rapidly, more so as I attempt to arouse pt.  Is not struggling to breathe, ie no dyspnea or air hunger apparent. No adventitious sounds. Abdomen: soft, NT, ND.  Active BS.  Extremities: UE non-pitting edema. Neuro/Psych:  Somnolent, difficult to arouse, did not attempt to fully awaken pt.  Intake/Output from previous day: 02/11 0701 - 02/12 0700 In: 2667.3 [P.O.:480; I.V.:2087.3; IV Piggyback:100] Out: 2750 [Urine:2750]  Intake/Output this shift: Total I/O In: 225 [I.V.:225] Out: -   Lab Results:  Basename 10/12/11 1608  WBC 7.9  HGB 14.2  HCT 41.3  PLT 163   BMET  Basename 10/13/11 1510  NA 136  K 3.4*  CL 100  CO2 26  GLUCOSE 123*  BUN 20  CREATININE 0.81  CALCIUM 10.0   LFT  Basename 10/13/11 1510  PROT 7.6  ALBUMIN 3.7  AST 17  ALT 16  ALKPHOS 39  BILITOT 1.1  BILIDIR --  IBILI --    CT HEAD WITHOUT CONTRAST 10/15/11 IMPRESSION:  Left thalamic hemorrhage is unchanged. Improved intraventricular  hemorrhage. No new findings.  PORTABLE CHEST - 1 VIEW 10/15/11 IMPRESSION:  Cardiomegaly again noted. No pulmonary edema. Stable basilar  atelectasis.   ASSESMENT: 1.  CG emesis, melena the day of admission.  All resolved.  Was on PPI  drip, now on daily po Protonix.  Hx bleeding ulcers 2011, PPI therapy had long lapsed PTA. 2.  Stable hemorrhagic CVA, R hemiparasis and aphasia, dysarthria. 3.  Hypertension requiring Nicardipine drip 4.  Dysphagia, neurogenic, cleared for po's yesterday. 5.  Agitation, restless leg syndrome: problems PTA and currently. Agitation worse post CVA, may have element of etoh withdrawal. 6.  Alcoholism, not in remission PTA.  Drank at least 4 glasses wine daily. 7.  UTI.  On Rocephin 8.  Tachypnea without gross resp distress. CXR unimpressive, head CT stable.  Suspect neurologic etiology// med s/e.     PLAN: 1.  Plan EGD for tomorrow, with heavy concsious sedation. Family will need to sign consent, they are not at bedside currently,     LOS: 7 days   Jennye Moccasin  10/15/2011, 10:40 AM Pager: [phone]

## 2011-10-16 ENCOUNTER — Encounter (HOSPITAL_COMMUNITY): Payer: Self-pay | Admitting: *Deleted

## 2011-10-16 ENCOUNTER — Other Ambulatory Visit: Payer: Self-pay | Admitting: Gastroenterology

## 2011-10-16 ENCOUNTER — Encounter (HOSPITAL_COMMUNITY)
Admission: EM | Disposition: A | Discharge status: Skilled Nursing Facility | Payer: Self-pay | Source: Home / Self Care | Attending: Neurology

## 2011-10-16 SURGERY — EGD (ESOPHAGOGASTRODUODENOSCOPY)
Anesthesia: Moderate Sedation

## 2011-10-16 MED ORDER — MIDAZOLAM HCL 10 MG/2ML IJ SOLN
INTRAMUSCULAR | Status: DC | PRN
  Administered 2011-10-16 (×2): 2 mg via INTRAVENOUS

## 2011-10-16 MED ORDER — MIDAZOLAM HCL 10 MG/2ML IJ SOLN
INTRAMUSCULAR | Status: AC
  Filled 2011-10-16: qty 2

## 2011-10-16 MED ORDER — BUTAMBEN-TETRACAINE-BENZOCAINE 2-2-14 % EX AERO
INHALATION_SPRAY | CUTANEOUS | Status: DC | PRN
  Administered 2011-10-16: 2 via TOPICAL

## 2011-10-16 MED ORDER — FENTANYL CITRATE 0.05 MG/ML IJ SOLN
INTRAMUSCULAR | Status: AC
  Filled 2011-10-16: qty 2

## 2011-10-16 MED ORDER — FENTANYL NICU IV SYRINGE 50 MCG/ML
INJECTION | INTRAMUSCULAR | Status: DC | PRN
  Administered 2011-10-16: 25 ug via INTRAVENOUS

## 2011-10-16 NOTE — Progress Notes (Signed)
      Gi Daily Rounding Note 10/16/2011, 9:10 AM  SUBJECTIVE: No complaints.  Oral care in progress.   Denies feeling SOB. Brown BM overnight.  OBJECTIVE: General: no distress, looks chronically unwell.  Vital signs in last 24 hours: Temp:  [98.4 F (36.9 C)-99.7 F (37.6 C)] 98.4 F (36.9 C) (02/13 0700) Pulse Rate:  [67-93] 81  (02/13 0900) Resp:  [20-31] 28  (02/13 0900) BP: (127-179)/(63-101) 138/91 mmHg (02/13 0900) SpO2:  [91 %-97 %] 96 % (02/13 0900) Last BM Date: 10/16/11  Heart: RRR Chest: diminished BS, still tachypneic without respiratory distress Abdomen: Soft, NT, ND.  BS active Extremities: slight non-pitting pedal edema. Neuro/Psych:  Appropriate, dysarthric, follows commands.  Currently no tremors.  Intake/Output from previous day: 02/12 0701 - 02/13 0700 In: 1800 [I.V.:1800] Out: 1000 [Urine:1000]  Intake/Output this shift: Total I/O In: 75 [I.V.:75] Out: -   Lab Results:  Basename 10/15/11 1205  WBC 10.2  HGB 13.4  HCT 39.6  PLT 212   BMET  Basename 10/15/11 1205 10/13/11 1510  NA 142 136  K 3.8 3.4*  CL 106 100  CO2 24 26  GLUCOSE 127* 123*  BUN 19 20  CREATININE 0.85 0.81  CALCIUM 9.5 10.0   LFT  Basename 10/13/11 1510  PROT 7.6  ALBUMIN 3.7  AST 17  ALT 16  ALKPHOS 39  BILITOT 1.1  BILIDIR --  IBILI --   Studies/Results: Ct Head Wo Contrast  10/15/2011  *RADIOLOGY REPORT*  Clinical Data: Altered mental status.  Follow up intracranial hemorrhage  CT HEAD WITHOUT CONTRAST  Technique:  Contiguous axial images were obtained from the base of the skull through the vertex without contrast.  Comparison: 10/12/2011  Findings: Image quality degraded by motion.  Left thalamic hemorrhage is unchanged.  High density hemorrhage measures 29 x 27 mm with surrounding low density.  Minimal intraventricular hemorrhage is improved.  No hydrocephalus.  No acute ischemic infarct.  No mass lesion  IMPRESSION: Left thalamic hemorrhage  is  unchanged.  Improved intraventricular hemorrhage.  No new findings.  Original Report Authenticated By: Camelia Phenes, M.D.   Dg Chest Port 1 View  10/15/2011  *RADIOLOGY REPORT*  Clinical Data: Shortness of breath, rule out pneumonia  PORTABLE CHEST - 1 VIEW  Comparison: 10/12/2011  Findings: Cardiomegaly again noted.  Central mild vascular congestion without convincing pulmonary edema.  Stable bilateral basilar atelectasis.  No segmental infiltrate.  IMPRESSION: Cardiomegaly again noted.  No pulmonary edema.  Stable basilar atelectasis.  Original Report Authenticated By: Natasha Mead, M.D.    ASSESMENT: 1. CG emesis, melena the day of admission. All resolved. Was on PPI drip, now on daily po Protonix. Hx bleeding ulcers 2011, PPI therapy had long lapsed PTA.  2. Stable hemorrhagic CVA, R hemiparasis and aphasia, dysarthria.  3. Hypertension requiring Nicardipine drip  4. Dysphagia, neurogenic, on mech soft diet/thick liquids. .  5. Agitation, restless leg syndrome: problems PTA and currently. Agitation worse post CVA, may have element of etoh withdrawal.  6. Alcoholism, not in remission PTA. Drank at least 4 glasses wine daily.  7. UTI. On Rocephin  8. Tachypnea without gross resp distress. CXR unimpressive, head CT stable. Suspect neurologic etiology/med s/e.    PLAN: 1.  EGD this AM.     LOS: 8 days   Jennye Moccasin  10/16/2011, 9:10 AM Pager: [phone]

## 2011-10-16 NOTE — Progress Notes (Signed)
Rehab admissions - Family unable to provide 24 hours supervision after a rehab stay.  Agree with pursuit of SNF since wife and son both work.  Patient's cognition is impaired so it is not likely that he would be able to stay alone after a potential inpatient rehab stay.  Social worker and I talked and she will work on SNF placement.  Pager [phone]

## 2011-10-16 NOTE — Op Note (Signed)
[facility] [address] [place], [place]  [address]  ENDOSCOPY PROCEDURE REPORT  PATIENT:  [name], Carl  MR#:  [id number] BIRTHDATE:  1946/01/17, 66 yrs. old  GENDER:  male ENDOSCOPIST:  Judie Petit T. Russella Dar, MD, [facility] Referred by: PROCEDURE DATE:  10/16/2011 PROCEDURE:  EGD with biopsy, [id number] ASA CLASS:  Class III INDICATIONS:  hematemesis, hemorrhage of GI tract MEDICATIONS:  Fentanyl 25 mcg IV, Versed 4 mg IV TOPICAL ANESTHETIC:  Cetacaine Spray DESCRIPTION OF PROCEDURE:   After the risks benefits and alternatives of the procedure were thoroughly explained, informed consent was obtained.  The Pentax Gastroscope [id number] endoscope was introduced through the mouth and advanced to the second portion of the duodenum, without limitations.  The instrument was slowly withdrawn as the mucosa was fully examined. <<PROCEDUREIMAGES>> Irregular Z-line in the distal esophagus. Multiple biopsies were obtained and sent to pathology. R/O short segment Barrett's. Otherwise normal esophagus.  The stomach was entered and closely examined. The pylorus, antrum, angularis, and lesser curvature were well visualized, including a retroflexed view of the cardia and fundus. The stomach wall was normally distensable. The scope passed easily through the pylorus into the duodenum.  The duodenal bulb was normal in appearance, as was the postbulbar duodenum. Retroflexed views revealed a hiatal hernia, small.  The scope was then withdrawn from the patient and the procedure completed.  COMPLICATIONS:  None  ENDOSCOPIC IMPRESSION: 1) Irregular Z-line 2) Small hiatal hernia  RECOMMENDATIONS: 1) Anti-reflux regimen 2) Await pathology results 3) PPI qam  [name] T. Russella Dar, MD, [facility]  n. eSIGNED:   Venita Lick. Sloan Takagi at 10/16/2011 12:04 PM  Dorothy Puffer, [phone]

## 2011-10-16 NOTE — Progress Notes (Signed)
I have taken an interval history, reviewed the chart and examined the patient. I agree with the extender's note, impression and recommendations.  EGD showed a small hiatal hernia and an irregular z-line, no ulcer disease or other lesions. Continue daily PPI for GERD and prior h/o ulcer disease.  We will sign off.  Venita Lick. Russella Dar MD [facility]

## 2011-10-16 NOTE — Progress Notes (Signed)
Physical Therapy Treatment Patient Details Name: Carl [name] MRN: [id number] DOB: 1945-12-10 Today's Date: 10/16/2011  PT Assessment/Plan  PT - Assessment/Plan Comments on Treatment Session: Today pt had great difficulty with standing, pt likely very fatigued from procedure and from being in bed lately. Still feel pt will be good candidate for CIR PT Plan: Discharge plan remains appropriate;Frequency remains appropriate PT Frequency: Min 4X/week Recommendations for Other Services: Rehab consult Follow Up Recommendations: Inpatient Rehab Equipment Recommended: Defer to next venue PT Goals  Acute Rehab PT Goals Pt will go Supine/Side to Sit: with modified independence PT Goal: Supine/Side to Sit - Progress: Progressing toward goal Pt will go Sit to Supine/Side: with modified independence PT Goal: Sit to Supine/Side - Progress: Progressing toward goal Pt will go Sit to Stand: with supervision PT Goal: Sit to Stand - Progress: Not progressing Pt will go Stand to Sit: with supervision PT Goal: Stand to Sit - Progress: Not progressing Pt will Transfer Bed to Chair/Chair to Bed: with supervision PT Transfer Goal: Bed to Chair/Chair to Bed - Progress: Not progressing  PT Treatment Precautions/Restrictions  Precautions Precautions: Fall Required Braces or Orthoses: No Restrictions Weight Bearing Restrictions: No Mobility (including Balance) Bed Mobility Bed Mobility: Yes Rolling Right: 4: Min assist Rolling Right Details (indicate cue type and reason): Verbal cues for sequence, pt with decreased awareness/sensation of Rt. UE and unable to appropriately place it for contribution to task Right Sidelying to Sit: 4: Min assist;With rails;HOB flat Right Sidelying to Sit Details (indicate cue type and reason): Assist to upright trunk. pt required cues to square hips at EOB and pt over compensating with Mod tactile cue to stop scooting. Sitting - Scoot to Edge of Bed: 4: Min assist Sitting -  Scoot to [place] of Bed Details (indicate cue type and reason): min assist to facilitate weightshift to advance opposite hip. Verbal cues to utilize Rt. UE, pt unable Sit to Supine: 1: +2 Total assist;Patient percentage (comment);HOB flat (pt = 30%) Sit to Supine - Details (indicate cue type and reason): Assist for trunk and LEs.  Scooting to [facility]: 1: +2 Total assist;Patient percentage (comment) (pt = 0%) Transfers Transfers: Yes Sit to Stand: With upper extremity assist;From bed;1: +2 Total assist;Patient percentage (comment) (pt = 20%) Sit to Stand Details (indicate cue type and reason): Attempted 3x. First two attempts unsuccessful with 2 person assist, with +3 person assist pt able to obtain standing. PT to assist/facilitate appropriate Bil. LE placement, anterior translation of trunk and pelvis with standing, anterior tilt of pelvis prior to stand. RW support in standing Stand to Sit: With upper extremity assist;To bed;Other (comment) (Total assist +3) Stand to Sit Details: Pt with buckled kness requiring significant assist to sit safely. Verbal cues to attempt to control descent Stand Pivot Transfer Details (indicate cue type and reason): Attempted, pt unable Ambulation/Gait Ambulation/Gait: No (attempted 1 step, pt's LE's buckled with RW in front) Stairs: No  Posture/Postural Control Posture/Postural Control: Postural limitations Postural Limitations: Decreased ankle foot strategies and unable to manitain standing without support. Static Sitting Balance Static Sitting - Balance Support: Feet supported;No upper extremity supported Static Sitting - Level of Assistance: 5: Stand by assistance Static Sitting - Comment/# of Minutes: Occasional excessive anterior lean.  Static Standing Balance Static Standing - Balance Support: Bilateral upper extremity supported Static Standing - Level of Assistance: 1: +2 Total assist;Other (comment) (pt = 70%) Static Standing - Comment/# of Minutes: Pt  tolerated ~2 min in standing  Exercise  General Exercises -  Lower Extremity Ankle Circles/Pumps: AROM;AAROM;Both;15 reps;Supine Quad Sets: AROM;Right;10 reps;Supine Heel Slides: AROM;Right;10 reps;Supine Other Exercises Other Exercises: Rt. trunk lean in sitting with Rt. UE supporting weight. Facilitation of Rt. triceps to push into extension. x 10 reps End of Session PT - End of Session Equipment Utilized During Treatment: Gait belt Activity Tolerance: Patient limited by fatigue Patient left: in bed;with call bell in reach Nurse Communication: Mobility status for transfers;Mobility status for ambulation General Behavior During Session: [facility] for tasks performed Cognition: Impaired Cognitive Impairment: Cognition appears very improved however very fatigued.   Sherie Don 10/16/2011, 4:18 PM  Sherie Don) Carleene Mains PT, DPT Acute Rehabilitation [phone]

## 2011-10-16 NOTE — Progress Notes (Signed)
Speech Language/Pathology Speech Pathology:  Treatment Note  Subjective:  Much more alert this a.m. and increase initiation during conversation   Objective:  Seen for language treatment and observation of meds with RN.  SLP provided oral care with Biotene and suction yankeur.  Pt. required mild-mod cues to swallow twice.  No indications of decreased airway protection.  Pt. Is having EGD today and is NPO except meds, therefore, SLP unable to administer additional po.  Introduced strategies to facilitate word finding abilities.  Pt. needed mild-mod verbal question cues to describe common objects.  Provided pt. with max verbal and demonstration cues throughout session to take deep breaths prior to speaking for increased volume and intelligibility at the word and short phrase level.  Pt. also used written information to identify correct response when unable to accurately verbalize with no cues.    Assessment:  Pt. is exhibiting progress toward language goals but continues to require max verbal and written cues for expressive and receptive ahasia  Recommendations:  Continue SLP for aphasia and dysphagia   Pain:   none Intervention Required:   No   Goals: No Goals Met  Royce Macadamia M.Ed [employer] [phone]  10/16/2011

## 2011-10-16 NOTE — Progress Notes (Signed)
. Stroke Team Progress Note  HISTORY Carl [name] is an 66 y.o. male white presenting in the ER with sudden onset right hemiparesis and hematemesis. The patient is encephalopathic so most of the history is coming from his family. [name] tells me that the patient was found on the floor by his son earlier tonight around 10:45 pm. The patient had bowel incontinence and had vomited his dinner. [name] and wife tried to put the patient in the shower at which point he started vomiting blood. The family then called 911 and the patient was brought to the ER. The way to the ER and EMS vehicle the patient continued to have bloody vomit. His blood pressures were elevated in the ER and he was aphasic and able to are only a few words. He had right-sided facial droop as well as the right arm and leg weakness. The patient was unable at that point to hold his arm up. During the course of history taking and examination the patient Improved in strength and speech.  according to the patient's wife and son comment the patient takes about 10-20 Tylenol that day for chronic neck pain. He also had bloody vomit last year and was found to have 2 ulcers. However comment that time he did not have any asymmetric weakness normal with the vomiting is excessive.  The patient is a recovering alcoholic and has been going to [employer] The wife is not sure, however, if he has stopped drinking alcohol altogether  SUBJECTIVE Per RN, "he's a new man". Sedation started wearing off yesterday around 2p. This am, he is awake, alert, following commands. Cardene off with SBP < 180 consistently. Oral BP meds working well. Scheduled for EGD this am at 930.  OBJECTIVE Filed Vitals:   10/16/11 0300 10/16/11 0400 10/16/11 0500 10/16/11 0600  BP: 163/75 159/92 150/92 162/85  Pulse: 73 71 73 73  Temp:  99.1 F (37.3 C)    TempSrc:  Oral    Resp: 28 27 24 25   Height:      Weight:      SpO2: 93% 95% 94% 93%   CBG (last  3)  No results found for this basename: GLUCAP:3 in the last 72 hours Intake/Output from previous day: 02/12 0701 - 02/13 0700 In: 1800 [I.V.:1800] Out: 1000 [Urine:1000]  IV Fluid Intake:     . niCARDipine Stopped (10/14/11 1327)   Medications    . atenolol  50 mg Oral BID  . cefTRIAXone (ROCEPHIN)  IV  1 g Intravenous Q24H  . cloNIDine  0.1 mg Oral BID  . doxazosin  8 mg Oral QHS  . folic acid  1 mg Oral Daily  . hydrALAZINE  100 mg Oral TID  . losartan  50 mg Oral Daily  . mulitivitamin with minerals  1 tablet Oral Daily  . pantoprazole  40 mg Oral Q1200  . sodium chloride 0.9 % 1,000 mL with potassium chloride 20 mEq infusion   Intravenous Q24H  . thiamine  100 mg Oral Daily   Or  . thiamine  100 mg Intravenous Daily  . DISCONTD: LORazepam  0-4 mg Intravenous Q6H  . DISCONTD: LORazepam  0-4 mg Intravenous Q12H  PRN acetaminophen, acetaminophen, diphenhydrAMINE, food thickener, hydrALAZINE, labetalol, LORazepam, LORazepam, ondansetron (ZOFRAN) IV  Diet:  NPO for EGD Activity:  Up with Assistance DVT Prophylaxis:  SCDs   Results for orders placed during the hospital encounter of 10/08/11 (from the past 24 hour(s))  CBC  Status: Abnormal   Collection Time   10/15/11 12:05 PM      Component Value Range   WBC 10.2  4.0 - 10.5 (K/uL)   RBC 4.15 (*) 4.22 - 5.81 (MIL/uL)   Hemoglobin 13.4  13.0 - 17.0 (g/dL)   HCT 78.4  69.6 - 29.5 (%)   MCV 95.4  78.0 - 100.0 (fL)   MCH 32.3  26.0 - 34.0 (pg)   MCHC 33.8  30.0 - 36.0 (g/dL)   RDW 28.4  13.2 - 44.0 (%)   Platelets 212  150 - 400 (K/uL)  BASIC METABOLIC PANEL     Status: Abnormal   Collection Time   10/15/11 12:05 PM      Component Value Range   Sodium 142  135 - 145 (mEq/L)   Potassium 3.8  3.5 - 5.1 (mEq/L)   Chloride 106  96 - 112 (mEq/L)   CO2 24  19 - 32 (mEq/L)   Glucose, Bld 127 (*) 70 - 99 (mg/dL)   BUN 19  6 - 23 (mg/dL)   Creatinine, Ser 1.02  0.50 - 1.35 (mg/dL)   Calcium 9.5  8.4 - 72.5 (mg/dL)    GFR calc non Af Amer 89 (*) >90 (mL/min)   GFR calc Af Amer >90  >90 (mL/min)    CT of the brain  10/12/2011 Stable appearance of the left thalamic intraparenchymal hematoma. 10/11/2011  No significant interval change in the size of the left thalamic intraparenchymal hemorrhage, measuring 3.1 x 1.7 cm in transverse dimension. Redistributing intraventricular blood, small amount now subarachnoid on the left. No overt hydrocephalus.  10/09/2011 No change in the left thalamic hematoma with intraventricular penetration. Axial measurement of 30 x 18 mm.  10/08/2011 Stable appearance of large left thalamic hemorrhagic infarct with intra-articular extension of hemorrhage. No significant interval change.  10/08/2011 Left thalamic/basal ganglia intraparenchymal hemorrhage, with extension into the ventricular system.  MRI of the brain  MRA of the brain  2D Echocardiogram EF 55-60% with no source of embolus.  Carotid Doppler No internal carotid artery stenosis bilaterally. Vertebrals with antegrade flow bilaterally.  CXR 10/08/2011 1. Low volumes with mild bilateral edema or infiltrates.  10/12/2011  Cardiac enlargement with vascular congestion. Mild bibasilar atelectasis, slightly improved from the prior study. EKG sinus rhythm  EEG This routine EEG done with the patient awake  and drowsy is abnormal. Background activities across right hemisphere is slightly too slow suggesting a mild-to-moderate encephalopathy of nonspecific etiology. The additional slowing across the left hemisphere in the delta range with paucity of faster activity suggest an underlying structural or functional lesion. The non-stereotyped both face, arm, and leg movements do not have an electrographic correlate. However, it is possible that a focal motor seizures involve 2 small part of the brain to be demonstrated on scalp EEG  GENERAL EXAM: Patient is in no distress  CARDIOVASCULAR: TACHYCARDIA. No murmurs, no carotid  bruits  NEUROLOGIC: MENTAL STATUS: AWAKE, PSYCHOMOTOR SLOWING, ABLE TO ANSWER QUESTIONS AND FOLLOW COMMANDS.  CRANIAL NERVE: pupils equal (2-->1) and reactive to light, eye movements full, facial strength symmetric MOTOR: normal bulk and tone; RUE 3/5; LUE 5/5. BLE 5/5.  SENSORY: normal and symmetric to light touch REFLEXES: deep tendon reflexes present and symmetric  ASSESSMENT Mr. DAKOTAH ORREGO is a 66 y.o. male  1. 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  2. 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 ST recleared 10/15/2011 for D3 nectar thick  3. Accelerated  HTN- off cardene drip. On oral BP meds 4. Agitation and combativeness: ETOH withdrawal vs UTI vs. ICH/IVH. On CIWA protocol.  5. UTI - will treat with IV Rocephin 1 gm Q 24 hrs. When taking PO's, will need 7 days total treatment. 6. GIB secondary to ulcers, GI consulted, treating medically. Endo scheduled for today. Hgb remains stable. 7. dyslipidemia- check levels  8. etoh abuse  9. chronic pain with excessive tylenol use  10. Tachypnea, ? Etiology, ? Aspiration pneumonia 11. Lethargy, likely Related to ativan, now resolved, CT, CXT and labs from yest stable. Ativan likely led to increased agitation.  Hospital day # 8  TREATMENT/PLAN  -EGD today (eval of prior GI bleed and ulcers) -transfer to the floor after EGD -SNF placement  Hospital day # [address], AVNP, ANP-BC, GNP-BC [facility] Pager: [phone] 10/16/2011 7:50 AM  Dr. Joycelyn Schmid has personally reviewed chart, pertinent data, examined the patient and developed the plan of care.  I evaluated and examined patient, reviewed pertinent data and imaging myself, and agree with assessment and plan as above. Keisean Skowron 10/16/2011 10:13 AM

## 2011-10-16 NOTE — H&P (View-Only) (Signed)
Stroke Team Progress Note  HISTORY Carl [name] is an 66 y.o. male white presenting in the ER with sudden onset right hemiparesis and hematemesis. The patient is encephalopathic so most of the history is coming from his family. [name] tells me that the patient was found on the floor by his son earlier tonight around 10:45 pm. The patient had bowel incontinence and had vomited his dinner. [name] and wife tried to put the patient in the shower at which point he started vomiting blood. The family then called 911 and the patient was brought to the ER. The way to the ER and EMS vehicle the patient continued to have bloody vomit. His blood pressures were elevated in the ER and he was aphasic and able to are only a few words. He had right-sided facial droop as well as the right arm and leg weakness. The patient was unable at that point to hold his arm up. During the course of history taking and examination the patient Improved in strength and speech.  according to the patient's wife and son comment the patient takes about 10-20 Tylenol that day for chronic neck pain. He also had bloody vomit last year and was found to have 2 ulcers. However comment that time he did not have any asymmetric weakness normal with the vomiting is excessive.  The patient is a recovering alcoholic and has been going to AA meetings. The wife is not sure, however, if he has stopped drinking alcohol altogether  SUBJECTIVE No complaints. Wife at bedside. Taking pos. Has not had breakfast yet today.  OBJECTIVE Filed Vitals:   10/10/11 0400 10/10/11 0500 10/10/11 0515 10/10/11 0600  BP: 142/69 179/101 132/87 146/80  Pulse: 84 47 84 89  Temp:      TempSrc:      Resp: 19 20 23 25  Height:      Weight:      SpO2: 99% 96% 96% 91%    CBG (last 3) No results found for this basename: GLUCAP:3 in the last 72 hours Intake/Output from previous day: 02/06 0701 - 02/07 0700 In: 3338.3 [I.V.:3338.3] Out: 2000  [Urine:2000]  IV Fluid Intake:     . niCARDipine 6 mg/hr (10/10/11 0722)  . DISCONTD: octreotide (SANDOSTATIN) infusion Stopped (10/09/11 0850)  . DISCONTD: pantoprozole (PROTONIX) infusion Stopped (10/09/11 0850)  . DISCONTD: sodium chloride 0.9 % 1,000 mL with potassium chloride 20 mEq infusion 75 mL/hr at 10/08/11 0807   Medications    . folic acid  1 mg Intravenous Daily  . pantoprazole (PROTONIX) IV  40 mg Intravenous Q12H  . sodium chloride 0.9 % 1,000 mL with multivitamins adult (MVI -12) 10 mL, potassium chloride 20 mEq infusion   Intravenous Q24H  . sodium chloride 0.9 % 1,000 mL with potassium chloride 20 mEq infusion   Intravenous Q24H  . thiamine  100 mg Intravenous Daily  . DISCONTD: multivitamin (MVI) IV infusion (LR or D5LR)  10 mL Intravenous Q24H  PRN acetaminophen, acetaminophen, food thickener, labetalol, LORazepam, ondansetron (ZOFRAN) IV  Diet:  Dysphagia 3 nectar thick liquids Activity:  Bedrest  DVT Prophylaxis:  SCDs   Significant Diagnostic Studies: CBC    Component Value Date/Time   WBC 6.7 10/08/2011 1754   RBC 4.44 10/08/2011 1754   HGB 14.6 10/08/2011 1754   HCT 41.8 10/08/2011 1754   PLT 145* 10/08/2011 1754   MCV 94.1 10/08/2011 1754   MCH 32.9 10/08/2011 1754   MCHC 34.9 10/08/2011 1754     RDW 13.7 10/08/2011 1754   LYMPHSABS 0.5* 10/08/2011 0044   MONOABS 0.5 10/08/2011 0044   EOSABS 0.0 10/08/2011 0044   BASOSABS 0.0 10/08/2011 0044   CMP    Component Value Date/Time   NA 141 10/10/2011 0520   K 3.3* 10/10/2011 0520   CL 104 10/10/2011 0520   CO2 25 10/10/2011 0520   GLUCOSE 133* 10/10/2011 0520   BUN 14 10/10/2011 0520   CREATININE 0.83 10/10/2011 0520   CALCIUM 9.5 10/10/2011 0520   PROT 7.3 10/10/2011 0520   ALBUMIN 3.6 10/10/2011 0520   AST 18 10/10/2011 0520   ALT 11 10/10/2011 0520   ALKPHOS 45 10/10/2011 0520   BILITOT 0.9 10/10/2011 0520   GFRNONAA >90 10/10/2011 0520   GFRAA >90 10/10/2011 0520   COAGS Lab Results  Component Value Date   INR 0.98 10/08/2011   Lipid  Panel    Component Value Date/Time   CHOL 211* 01/15/2007 1027   TRIG 303* 01/15/2007 1027   HDL 38.3* 01/15/2007 1027   CHOLHDL 5.5 CALC 01/15/2007 1027   VLDL 61* 01/15/2007 1027   HgbA1C  Lab Results  Component Value Date   HGBA1C 5.6 11/04/2007   Urine Drug Screen     Component Value Date/Time   LABOPIA NONE DETECTED 10/08/2011 0245   COCAINSCRNUR NONE DETECTED 10/08/2011 0245   LABBENZ NONE DETECTED 10/08/2011 0245   AMPHETMU NONE DETECTED 10/08/2011 0245   THCU NONE DETECTED 10/08/2011 0245   LABBARB NONE DETECTED 10/08/2011 0245    Alcohol Level No results found for this basename: eth  Ct Head Wo Contrast  CT of the brain    10/09/2011  No change in the left thalamic hematoma with intraventricular penetration.  Axial measurement of 30 x 18 mm.  10/08/2011   Stable appearance of large left thalamic hemorrhagic infarct with intra-articular extension of hemorrhage. No significant interval change.   10/08/2011 Left thalamic/basal ganglia intraparenchymal hemorrhage, with extension into the ventricular system.   MRI of the brain    MRA of the brain    2D Echocardiogram  EF 55-60% with no source of embolus.   Carotid Doppler  No internal carotid artery stenosis bilaterally. Vertebrals with antegrade flow bilaterally.   CXR   10/08/2011  1.  Low volumes with mild bilateral edema or infiltrates.    EKG  sinus rhythm   Physical Exam    The patient is sleepy, but can be aroused.  The patient has a dysarthric speech pattern, no aphasia.  Respiratory examination is clear.  Cardiovascular examination shows a regular rate and rhythm, no obvious murmurs or rubs.  Abdomen is obese, positive bowel sounds, nontender.  Extremities are without significant edema.  The patient appears to have a depression of the right nasolabial fold. Extraocular movements are full, visual fields are full.  The patient has mild weakness of the right arm greater than right leg, drift of the right upper extremity.  Patient has normal strength of the left side.  The patient has some clumsiness with finger-nose-finger with the right arm, normal in the left. The patient is able to perform toe to finger bilaterally.  Deep tendon reflexes are depressed but symmetric.  The patient was not ambulated.  ASSESSMENT [name] is a 66 y.o. 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 hemorrhage secondary to hypertension. Discussed case with Dr. Kritzer who wants to know if he decompensates.   -dysphagia, right hemiparesis and aphasia secondary   to hemorrhage -GIB secondary to ulcers, GI consulted, treating medically. Drips stopped. Recommend endo prior to discharge -hypokalemia, 3.3 this am -dyslipidemia -etoh abuse -hypertension, on iv cardene -chronic pain with excessive tylenol use  Hospital day # 2  TREATMENT/PLAN -resume home oral BP meds, wean cardene -OOB. Therapy evals. -Kcl replacement  SHARON BIBY, AVNP, ANP-BC, GNP-BC [facility] Pager: [phone] 10/10/2011 7:45 AM  Dr. Keith Audryana Hockenberry has personally reviewed chart, pertinent data, examined the patient and developed the plan of care.  BIBY,SHARON  Hriday Stai KEITH 

## 2011-10-16 NOTE — Progress Notes (Signed)
OT Cancellation Note  Treatment cancelled today due to:  Pt off floor for EGD with biopsy. Ot will re attempt treatment at later date/time.    Jadarian, Mckay Dixon   OTR/L Pager: [phone] Office: [phone] .

## 2011-10-16 NOTE — Interval H&P Note (Signed)
History and Physical Interval Note:  10/16/2011 11:35 AM  Carl [name]  has presented today for surgery, with the diagnosis of GI bleed  The various methods of treatment have been discussed with the patient and family. After consideration of risks, benefits and other options for treatment, the patient has consented to  Procedure(s) (LRB): ESOPHAGOGASTRODUODENOSCOPY (EGD) (N/A) as a surgical intervention .  The patients' history has been reviewed, patient examined, no change in status, stable for surgery.  I have reviewed the patients' chart and labs.  Questions were answered to the patient's satisfaction.     Venita Lick. Russella Dar MD Clementeen Graham

## 2011-10-17 MED ORDER — CLONIDINE HCL 0.1 MG PO TABS
0.1000 mg | ORAL_TABLET | Freq: Two times a day (BID) | ORAL | Status: DC
  Administered 2011-10-17 – 2011-10-21 (×8): 0.1 mg via ORAL
  Filled 2011-10-17 (×9): qty 1

## 2011-10-17 MED ORDER — HALOPERIDOL LACTATE 5 MG/ML IJ SOLN
5.0000 mg | Freq: Four times a day (QID) | INTRAMUSCULAR | Status: DC | PRN
  Administered 2011-10-19: 5 mg via INTRAVENOUS
  Filled 2011-10-17: qty 1

## 2011-10-17 MED ORDER — CEPHALEXIN 500 MG PO CAPS
500.0000 mg | ORAL_CAPSULE | Freq: Two times a day (BID) | ORAL | Status: DC
  Administered 2011-10-17 – 2011-10-21 (×9): 500 mg via ORAL
  Filled 2011-10-17 (×10): qty 1

## 2011-10-17 NOTE — Progress Notes (Signed)
Occupational Therapy Treatment Patient Details Name: Carl [name] MRN: [id number] DOB: 05-Apr-1946 Today's Date: 10/17/2011  OT Assessment/Plan OT Assessment/Plan Comments on Treatment Session: Pt with stable BP during session. Pt with increased Rt LE weakness. pt unable to ambulate this session as in previous session due to increased weakness. Pt reports "weak" with various questioning. Pt requires hoyer lift by RN staff to transfer to chair. This change in mobility is sufficient compared to previous sessions. OT Plan: Discharge plan remains appropriate OT Frequency: Min 2X/week Recommendations for Other Services: Rehab consult Follow Up Recommendations: Inpatient Rehab Equipment Recommended: Defer to next venue OT Goals Acute Rehab OT Goals OT Goal Formulation: With patient/family Time For Goal Achievement: 2 weeks ADL Goals Pt Will Perform Grooming: with set-up;Sitting, chair;Unsupported ADL Goal: Grooming - Progress: Progressing toward goals Pt Will Perform Upper Body Bathing: with set-up;Sitting, chair;Supported ADL Goal: Product manager - Progress: Progressing toward goals Pt Will Perform Lower Body Bathing: with mod assist;Sit to stand from chair;Sit to stand from bed;Supported ADL Goal: Lower Body Bathing - Progress: Not met Pt Will Perform Upper Body Dressing: with set-up;Sitting, chair;Supported ADL Goal: Location manager Dressing - Progress: Progressing toward goals Pt Will Perform Lower Body Dressing: with mod assist;Sit to stand from chair;Sit to stand from bed;Supported ADL Goal: Lower Body Dressing - Progress: Not met Pt Will Transfer to Toilet: with supervision;3-in-1;Ambulation ADL Goal: Toilet Transfer - Progress: Not met Miscellaneous OT Goals Miscellaneous OT Goal #1: Pt will place 4 out 6 objects in container on first try without undershooting demonstrating improvement in motor planning as precursor to sink level ADLS OT Goal: Miscellaneous Goal #1 - Progress: Not  met  OT Treatment Precautions/Restrictions  Precautions Precautions: Fall Required Braces or Orthoses: No Restrictions Weight Bearing Restrictions: No   ADL ADL Eating/Feeding: Set up;Performed Eating/Feeding Details (indicate cue type and reason): pt with BIL Ue positioned on tray to (A) with self feeding. pt requires prolonged period of time to eat break. Pt eating breakfast for greater than one hour. Pt started breakfast prior to OT session. Pt picking up open containers of juice and attempting to drink out of containers with two full cups of juice. Pt poor recall of finished items and previous attempts.  Where Assessed - Eating/Feeding: Bed level Grooming: Performed;Brushing hair;Minimal assistance (min(A) hair and Mod (A) razor) Grooming Details (indicate cue type and reason): Pt provided support at [place] to allow pt to hold comb and brush hair with Rt UE. Pt brushing Rt side of head mainly and neglecting lt side. Pt provided electric razor and terminating taste premature. Pt required Max (A) with razor use. Where Assessed - Grooming: Sitting, chair;Supported Location manager Dressing: Performed;Minimal assistance Upper Body Dressing Details (indicate cue type and reason): don gown as robe Where Assessed - Upper Body Dressing: Sitting, bed;Unsupported Equipment Used:  (attempted RW and poor demonstration this session) Ambulation Related to ADLs: Pt unable to maintain static standing during session. Pt with Rt LE buckling and hyper extending with static standing weight shifting.  ADL Comments: Pt squat pivot with total +2 Pt=20% Pt with Rt LE buckling with pivot Lt side. Pt with facilitation for weight shift and turning hips with transfer. Pt requires increased time to answer questions and delayed response. Pt asked "how old is your dog Bruce"Pt required over two minutes to respond to question.Pt did answer question as "9 weeks plus 2 months". Pt puppy according to wife is 63 months old during  last weeks evaluation.  Mobility  Bed  Mobility Bed Mobility: Yes Rolling Left: 3: Mod assist Rolling Left Details (indicate cue type and reason): Pt required (A) with BIL LE and facilitation to advance LEs. Pt demonstrates difficulty motor planning to the Lt side Right Sidelying to Sit: 3: Mod assist;With rails;HOB elevated (comment degrees) (hob 30 degrees) Right Sidelying to Sit Details (indicate cue type and reason): (A) to bring trunk upright. Pt required (A) to scoot hips to EOB. Pt facilitating all movement with Lt side. Supine to Sit: 3: Mod assist Supine to Sit Details (indicate cue type and reason): Pt activating Lt side of trunk (core) for supine <> sit. Pt sitting EOB min guard (A) Transfers Sit to Stand: With upper extremity assist;From bed;1: +2 Total assist;Patient percentage (comment) (pt 30%) Sit to Stand Details (indicate cue type and reason): pt initiates task and then stops upright posture when hip flexion occurs. Pt pauses in a flexed position with posture lean.Pt lifting Rt LE (knee flexion) with max v/c for upright posture. Pt demonstrates motor planning deficits attempting to maintain Rt LE on floor to weight bear. Facitiation at sacrum and trunk required to come to upright static standing. Once static standing pt weight bearing on Rt LE with knee buckling.  Stand to Sit: With upper extremity assist;Other (comment);To chair/3-in-1 (pt 10%) Stand to Sit Details: Pt requires Rt LE blocking for sitting. pt uncontrolled descend Exercises    End of Session OT - End of Session Equipment Utilized During Treatment: Gait belt Activity Tolerance: Patient tolerated treatment well Patient left: in chair;with call bell in reach (maxi sky pad placed for RN to (A) pt back to bed) Nurse Communication: Mobility status for transfers General Behavior During Session: [facility] for tasks performed Cognition: Impaired Cognitive Impairment: delayed processing, impulsive  Jasin, Brazel  10/17/2011, 1:23 PM Pager: [phone]

## 2011-10-17 NOTE — Progress Notes (Signed)
CSW met with pt to introduce herself and explain role. Pt appeared to be more alert, but his speech was impaired. CSW will follow up with family regarding bed offers. CSW will also follow up with pt independently to address substance abuse consult. CSW to continue to follow.   Dede Query, MSW, Theresia Majors [phone]

## 2011-10-17 NOTE — Progress Notes (Signed)
CSW completed FL2 and sent request to [facility]. CSW will follow up with bed offers. CSW also inititate [employer] Pre Auth for SNF placement at discharge.   Dede Query, MSW, Theresia Majors [phone]

## 2011-10-17 NOTE — Progress Notes (Signed)
. Stroke Team Progress Note  HISTORY UNNAMED Carl [name] is an 66 y.o. male white presenting in the ER with sudden onset right hemiparesis and hematemesis. The patient is encephalopathic so most of the history is coming from his family. [name] tells me that the patient was found on the floor by his son earlier tonight around 10:45 pm. The patient had bowel incontinence and had vomited his dinner. [name] and wife tried to put the patient in the shower at which point he started vomiting blood. The family then called 911 and the patient was brought to the ER. The way to the ER and EMS vehicle the patient continued to have bloody vomit. His blood pressures were elevated in the ER and he was aphasic and able to are only a few words. He had right-sided facial droop as well as the right arm and leg weakness. The patient was unable at that point to hold his arm up. During the course of history taking and examination the patient Improved in strength and speech.  according to the patient's wife and son comment the patient takes about 10-20 Tylenol that day for chronic neck pain. He also had bloody vomit last year and was found to have 2 ulcers. However comment that time he did not have any asymmetric weakness normal with the vomiting is excessive.  The patient is a recovering alcoholic and has been going to [employer] The wife is not sure, however, if he has stopped drinking alcohol altogether  SUBJECTIVE Awake. Little confused. No med surg beds available on floor yest.he has no new complaints today. Blood pressure remains adequately controlled.  OBJECTIVE Filed Vitals:   10/17/11 0200 10/17/11 0300 10/17/11 0400 10/17/11 0500  BP: 127/76 148/83 145/83 159/78  Pulse: 69 70 69 67  Temp:   98.1 F (36.7 C)   TempSrc:   Oral   Resp: 28 26 26 26   Height:      Weight:      SpO2: 94% 94% 94% 94%   CBG (last 3)  No results found for this basename: GLUCAP:3 in the last 72 hours Intake/Output  from previous day: 02/13 0701 - 02/14 0700 In: 1935 [P.O.:360; I.V.:1575] Out: 1425 [Urine:1425]  IV Fluid Intake:      . niCARDipine Stopped (10/14/11 1327)   Medications     . atenolol  50 mg Oral BID  . cefTRIAXone (ROCEPHIN)  IV  1 g Intravenous Q24H  . cloNIDine  0.1 mg Oral BID  . doxazosin  8 mg Oral QHS  . folic acid  1 mg Oral Daily  . hydrALAZINE  100 mg Oral TID  . losartan  50 mg Oral Daily  . mulitivitamin with minerals  1 tablet Oral Daily  . pantoprazole  40 mg Oral Q1200  . sodium chloride 0.9 % 1,000 mL with potassium chloride 20 mEq infusion   Intravenous Q24H  . thiamine  100 mg Oral Daily  . DISCONTD: thiamine  100 mg Intravenous Daily  PRN acetaminophen, acetaminophen, diphenhydrAMINE, food thickener, hydrALAZINE, labetalol, ondansetron (ZOFRAN) IV, DISCONTD: butamben-tetracaine-benzocaine, DISCONTD: fentaNYL, DISCONTD: LORazepam, DISCONTD: LORazepam, DISCONTD: midazolam  Diet:  Dysphagia for EGD Activity:  Up with Assistance DVT Prophylaxis:  SCDs   No results found for this or any previous visit (from the past 24 hour(s)).  CT of the brain  10/12/2011 Stable appearance of the left thalamic intraparenchymal hematoma. 10/11/2011  No significant interval change in the size of the left thalamic intraparenchymal hemorrhage, measuring  3.1 x 1.7 cm in transverse dimension. Redistributing intraventricular blood, small amount now subarachnoid on the left. No overt hydrocephalus.  10/09/2011 No change in the left thalamic hematoma with intraventricular penetration. Axial measurement of 30 x 18 mm.  10/08/2011 Stable appearance of large left thalamic hemorrhagic infarct with intra-articular extension of hemorrhage. No significant interval change.  10/08/2011 Left thalamic/basal ganglia intraparenchymal hemorrhage, with extension into the ventricular system.  MRI of the brain  MRA of the brain  2D Echocardiogram EF 55-60% with no source of embolus.  Carotid Doppler No  internal carotid artery stenosis bilaterally. Vertebrals with antegrade flow bilaterally.  CXR 10/08/2011 1. Low volumes with mild bilateral edema or infiltrates.  10/12/2011  Cardiac enlargement with vascular congestion. Mild bibasilar atelectasis, slightly improved from the prior study. EKG sinus rhythm  EEG This routine EEG done with the patient awake  and drowsy is abnormal. Background activities across right hemisphere is slightly too slow suggesting a mild-to-moderate encephalopathy of nonspecific etiology. The additional slowing across the left hemisphere in the delta range with paucity of faster activity suggest an underlying structural or functional lesion. The non-stereotyped both face, arm, and leg movements do not have an electrographic correlate. However, it is possible that a focal motor seizures involve 2 small part of the brain to be demonstrated on scalp EEG ENDO 1) Irregular Z-line 2) Small hiatal hernia  GENERAL EXAM: Patient is in no distress  CARDIOVASCULAR: TACHYCARDIA. No murmurs, no carotid bruits Lungs clear to auscultation. Abdomen soft nontender  NEUROLOGIC: Awake alert oriented x2 only. Diminished attention, short-term memory, registration and recall. Follow simple one-step commands only. Speech is hesitant and not blunt. Mild dysarthria. Extraocular movements are full range rated there is moderate right lower facial weakness. Tongue is midline. Motor system exam reveals significant right hemiparesis. Right upper extremity is 3/5 but significant weakness of the right grip and intrinsic hand muscles. Right lower extremity is /2/5 but significant weakness proximally and distally. Diminished sensation on the right hemibody. Mild right-sided neglect. Good strength sensation coordination on the left side. Gait was not tested .ASSESSMENT [name] is a 66 y.o. male  1. 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  2. 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 ST recleared 10/15/2011 for D3 nectar thick  3. Accelerated HTN- off cardene drip. On oral BP meds 4. Agitation and combativeness: ETOH withdrawal vs UTI vs. ICH/IVH. On CIWA protocol.  5. UTI - will treat with IV Rocephin 1 gm Q 24 hrs. When taking PO's, will need 7 days total treatment. 6. GIB. EGD with irregular Z line, bx pending.  Small HH,. Treating with anti-reflux regimen. 7. dyslipidemia- check levels  8. etoh abuse  9. chronic pain with excessive tylenol use  10. Tachypnea, resolved 11. Lethargy, agitation. Related to ativan, now resolved, CT, CXT and labs from yest stable. Ativan likely led to increased agitation.  Hospital day # 9  TREATMENT/PLAN  -transfer to the floor  -SNF placement  Hospital day # [address], Garza-Salinas II, ANP-BC, GNP-BC [facility] Pager: [phone] 10/17/2011 7:49 AM  Dr. Delia Heady, Stroke Center Medical Director, has personally reviewed chart, pertinent data, examined the patient and developed the plan of care.

## 2011-10-17 NOTE — Progress Notes (Signed)
Physical Therapy Treatment Patient Details Name: ULISSES VONDRAK MRN: [id number] DOB: 09/11/1945 Today's Date: 10/17/2011  PT Assessment/Plan  PT - Assessment/Plan Comments on Treatment Session: continues to have significant difficulty achieving standing.    PT Frequency: Min 4X/week Follow Up Recommendations: Inpatient Rehab Equipment Recommended: Defer to next venue PT Goals  Acute Rehab PT Goals PT Goal: Supine/Side to Sit - Progress: Not met PT Goal: Sit to Stand - Progress: Not met PT Goal: Stand to Sit - Progress: Not met PT Transfer Goal: Bed to Chair/Chair to Bed - Progress: Not met  PT Treatment Precautions/Restrictions  Precautions Precautions: Fall Required Braces or Orthoses: No Restrictions Weight Bearing Restrictions: No Mobility (including Balance) Bed Mobility Rolling Left: 3: Mod assist Rolling Left Details (indicate cue type and reason): Assist for LE management.  Difficulty motor planning to the Lt side.   Supine to Sit: 3: Mod assist Supine to Sit Details (indicate cue type and reason): (A) to lift shoulders/trunk to sitting upright.  (A) to bring hips closer to EOB to position feet on floor for increased support/stability.   Transfers Sit to Stand: 1: +2 Total assist;Other (comment);From bed (pt=30%.  ) Sit to Stand Details (indicate cue type and reason): Pt initiates task & then stops midway of achieving hip/knee extension.  Pt pauses in flexed position.  Pt lifting Rt. LE/foot off floor with max cues for upright posture.  Pt demonstrates motor planning deficits attempting to maintain Rt LE on floor to weight bear.  Facilitation provided to promote increased trunk/hip extension to achieve standing upright.  Assistance to prevent Rt knee from buckling.   Stand to Sit: 1: +2 Total assist;Other (comment) (pt=10%) Stand to Sit Details: Blocking Rt knee due to buckling.  Assistance to control descent & safety.   Stand Pivot Transfers: 1: +2 Total assist;Other (comment)  (pt-20%) Stand Pivot Transfer Details (indicate cue type and reason): Assistance for balance, rotation of hips from bed>recliner, blocking of Rt knee due to buckling with each weight shift towards Rt.   Ambulation/Gait Ambulation/Gait: No  Balance Balance Assessed: No Exercise    End of Session PT - End of Session Equipment Utilized During Treatment: Gait belt Patient left: in chair;with call bell in reach Nurse Communication: Mobility status for transfers;Need for lift equipment General Behavior During Session: [facility] for tasks performed Cognition: Impaired Cognitive Impairment: decreased processing, motor planning deficits.    Lara Mulch 10/17/2011, 3:41 PM [phone]

## 2011-10-17 NOTE — Progress Notes (Signed)
Utilization review completed. Garland Hincapie, RN, BSN. 10/17/11  

## 2011-10-18 MED ORDER — ENOXAPARIN SODIUM 40 MG/0.4ML ~~LOC~~ SOLN
40.0000 mg | SUBCUTANEOUS | Status: DC
  Administered 2011-10-18 – 2011-10-21 (×4): 40 mg via SUBCUTANEOUS
  Filled 2011-10-18 (×4): qty 0.4

## 2011-10-18 NOTE — Progress Notes (Signed)
Physical Therapy Treatment Patient Details Name: HARI CASAUS MRN: [id number] DOB: August 08, 1946 Today's Date: 10/18/2011  PT Assessment/Plan  PT - Assessment/Plan Comments on Treatment Session: Pt making slow improvements. Had increased Rt. knee swelling and pain today, RN and MD made aware. Pt reports h/o gout with similar pain however reports he is also worried he may have injured it in his fall. Pt very motivated to improve. PT Plan: Discharge plan remains appropriate;Frequency remains appropriate PT Frequency: Min 4X/week Recommendations for Other Services: Rehab consult Follow Up Recommendations: Inpatient Rehab;Skilled nursing facility Equipment Recommended: Defer to next venue PT Goals  Acute Rehab PT Goals Pt will go Supine/Side to Sit: with modified independence PT Goal: Supine/Side to Sit - Progress: Progressing toward goal Pt will go Sit to Stand: with supervision PT Goal: Sit to Stand - Progress: Progressing toward goal Pt will go Stand to Sit: with supervision PT Goal: Stand to Sit - Progress: Progressing toward goal Pt will Transfer Bed to Chair/Chair to Bed: with supervision PT Transfer Goal: Bed to Chair/Chair to Bed - Progress: Progressing toward goal  PT Treatment Precautions/Restrictions  Precautions Precautions: Fall Precaution Comments: Significant weakness Required Braces or Orthoses: No Restrictions Weight Bearing Restrictions: No Mobility (including Balance) Bed Mobility Bed Mobility: Yes Rolling Left: 3: Mod assist Rolling Left Details (indicate cue type and reason): Assist for LE management. Facilitation to reach with Rt. UE, flexion of Rt. LE (limited by pain).  Right Sidelying to Sit:  (hob 30 degrees) Left Sidelying to Sit: 3: Mod assist Left Sidelying to Sit Details (indicate cue type and reason): Verbal cues for sequencing, efficiency of movement. Pt making good progress as compared to previous visits.  Sitting - Scoot to Edge of Bed Details  (indicate cue type and reason): min assist to facilitate weightshift to advance opposite hip. Verbal cues to utilize Rt. UE, improved ability to utilize. Transfers Sit to Stand: With upper extremity assist;From bed;1: +2 Total assist;Patient percentage (comment) (pt 30%) Sit to Stand Details (indicate cue type and reason): Pt initiates task & then stops midway of achieving hip/knee extension. Pt prematurely reaches for RW. Pt continues with difficulty carrying out motor function of Lt. side frequently flexing Rt. knee with intent to extend (seen also with LE exercises). Facilitation provided to improve hip extension to reach upright standing.  Stand to Sit: With upper extremity assist;Other (comment);To chair/3-in-1;1: +2 Total assist;Patient percentage (comment) (pt 30%) Stand to Sit Details: Pt not following commands to position UEs on armrests to help control descent.  Stand Pivot Transfers: 1: +2 Total assist;Patient percentage (comment) Stand Pivot Transfer Details (indicate cue type and reason): Pt performing better than last session. Verbal cues/facilitation to promote Rt. weightshift and pivoting of bil. LEs. Pt with minimal Rt. LE weight acceptance. Pt requires sequential cuing.  Ambulation/Gait Ambulation/Gait: No (pt unable at this time)  Posture/Postural Control Posture/Postural Control: Postural limitations Postural Limitations: Decreased ankle foot strategies and unable to manitain standing without support. Balance Balance Assessed: Yes Static Sitting Balance Static Sitting - Balance Support: Feet supported;No upper extremity supported Static Sitting - Level of Assistance: 5: Stand by assistance Static Sitting - Comment/# of Minutes: No excess leaning today Static Standing Balance Static Standing - Balance Support: Bilateral upper extremity supported Static Standing - Level of Assistance: 1: +2 Total assist;Other (comment);Patient percentage (comment) (pt = 70%) Static Standing -  Comment/# of Minutes: Pt tolerated ~2 min in standing before beginning to fatigue.  Dynamic Standing Balance Dynamic Standing - Balance Support: Bilateral upper  extremity supported Dynamic Standing - Level of Assistance: 1: +2 Total assist;Patient percentage (comment) (pt = 60%) Dynamic Standing - Balance Activities: Lateral lean/weight shifting Exercise  General Exercises - Lower Extremity Ankle Circles/Pumps: AROM;Both;Supine;20 reps;Seated Quad Sets: AROM;Supine;AAROM;Both;15 reps (pt tending to flex Rt. knee instead of extend) Long Arc Quad: AROM;AAROM;Left;Right;20 reps;Seated Heel Slides: Right;10 reps;Supine;AAROM Hip Flexion/Marching: AROM;AAROM;Both;Other reps (comment);Seated (20 reps, pt progressing to AROM of Rt. hip. ) End of Session PT - End of Session Equipment Utilized During Treatment: Gait belt Activity Tolerance: Patient limited by fatigue Patient left: in chair;with call bell in reach Nurse Communication: Mobility status for transfers;Need for lift equipment General Behavior During Session: [facility] for tasks performed Cognition: Impaired Cognitive Impairment: decreased processing, motor planning deficits.    Sherie Don 10/18/2011, 1:01 PM  Sherie Don) Carleene Mains PT, DPT Acute Rehabilitation [phone]

## 2011-10-18 NOTE — Progress Notes (Signed)
Speech Pathology: Dysphagia Treatment Note  Patient was observed with : Nectar liquids.  Patient was noted to have s/s of aspiration : No  Patient required:  minimal cues to consistently follow precautions/strategies (small sips)  Clinical Impression: Demonstrates continued tolerance with current diet, requiring cues to take small sips.  Given last study was over 1 week ago, will proceed with a repeat MBSS to determine if thickened liquids are still necessary.  Recommendations:  1. Repeat MBSS  Pain:   none Intervention Required:   No  Goals: Partially met  Myra Rude, M.S.,CCC-SLP Pager [phone]

## 2011-10-18 NOTE — Progress Notes (Signed)
CSW met with pt and pt's wife regarding bed offers. Currently, the only bed offer in [place] is [facility]. Awaiting insurance approval for SNF. Pt and wife are agreeable to discharge plan. CSW will facilitate discharge on Monday, 10/21/11.  Dede Query, MSW, Theresia Majors [phone]

## 2011-10-18 NOTE — Progress Notes (Signed)
. Stroke Team Progress Note  HISTORY Carl [name] is an 66 y.o. male white presenting in the ER with sudden onset right hemiparesis and hematemesis. The patient is encephalopathic so most of the history is coming from his family. [name] tells me that the patient was found on the floor by his son earlier tonight around 10:45 pm. The patient had bowel incontinence and had vomited his dinner. [name] and wife tried to put the patient in the shower at which point he started vomiting blood. The family then called 911 and the patient was brought to the ER. The way to the ER and EMS vehicle the patient continued to have bloody vomit. His blood pressures were elevated in the ER and he was aphasic and able to are only a few words. He had right-sided facial droop as well as the right arm and leg weakness. The patient was unable at that point to hold his arm up. During the course of history taking and examination the patient Improved in strength and speech.  according to the patient's wife and son comment the patient takes about 10-20 Tylenol that day for chronic neck pain. He also had bloody vomit last year and was found to have 2 ulcers. However comment that time he did not have any asymmetric weakness normal with the vomiting is excessive.  The patient is a recovering alcoholic and has been going to [employer] The wife is not sure, however, if he has stopped drinking alcohol altogether  SUBJECTIVE Agitated yest afternoon, resolved spontaneously without intervention. Pt not in restraints.  OBJECTIVE Filed Vitals:   10/17/11 1900 10/17/11 2000 10/17/11 2155 10/18/11 0647  BP: 129/75 137/80 166/83 159/88  Pulse: 81 84 77 71  Temp:   98.4 F (36.9 C) 98 F (36.7 C)  TempSrc:   Oral Oral  Resp: 23 20 20 20   Height:      Weight:      SpO2: 97% 98% 95% 94%   CBG (last 3)  No results found for this basename: GLUCAP:3 in the last 72 hours Intake/Output from previous day: 02/14 0701  - 02/15 0700 In: 1723 [P.O.:1723] Out: 900 [Urine:900]  IV Fluid Intake:     . niCARDipine Stopped (10/14/11 1327)   Medications    . atenolol  50 mg Oral BID  . cephALEXin  500 mg Oral Q12H  . cloNIDine  0.1 mg Oral BID  . doxazosin  8 mg Oral QHS  . folic acid  1 mg Oral Daily  . hydrALAZINE  100 mg Oral TID  . losartan  50 mg Oral Daily  . mulitivitamin with minerals  1 tablet Oral Daily  . pantoprazole  40 mg Oral Q1200  . thiamine  100 mg Oral Daily  . DISCONTD: cefTRIAXone (ROCEPHIN)  IV  1 g Intravenous Q24H  . DISCONTD: cloNIDine  0.1 mg Oral BID  . DISCONTD: sodium chloride 0.9 % 1,000 mL with potassium chloride 20 mEq infusion   Intravenous Q24H  PRN acetaminophen, food thickener, haloperidol lactate, labetalol, DISCONTD: acetaminophen, DISCONTD: diphenhydrAMINE, DISCONTD: hydrALAZINE, DISCONTD: ondansetron (ZOFRAN) IV  Diet:  Dysphagia 3 nectar thick diet Activity:  Up with Assistance DVT Prophylaxis:  SCDs   CT of the brain  10/12/2011 Stable appearance of the left thalamic intraparenchymal hematoma. 10/11/2011  No significant interval change in the size of the left thalamic intraparenchymal hemorrhage, measuring 3.1 x 1.7 cm in transverse dimension. Redistributing intraventricular blood, small amount now subarachnoid on the left.  No overt hydrocephalus.  10/09/2011 No change in the left thalamic hematoma with intraventricular penetration. Axial measurement of 30 x 18 mm.  10/08/2011 Stable appearance of large left thalamic hemorrhagic infarct with intra-articular extension of hemorrhage. No significant interval change.  10/08/2011 Left thalamic/basal ganglia intraparenchymal hemorrhage, with extension into the ventricular system.  MRI of the brain  MRA of the brain  2D Echocardiogram EF 55-60% with no source of embolus.  Carotid Doppler No internal carotid artery stenosis bilaterally. Vertebrals with antegrade flow bilaterally.  CXR 10/08/2011 1. Low volumes with mild  bilateral edema or infiltrates.  10/12/2011  Cardiac enlargement with vascular congestion. Mild bibasilar atelectasis, slightly improved from the prior study. EKG sinus rhythm  EEG This routine EEG done with the patient awake  and drowsy is abnormal. Background activities across right hemisphere is slightly too slow suggesting a mild-to-moderate encephalopathy of nonspecific etiology. The additional slowing across the left hemisphere in the delta range with paucity of faster activity suggest an underlying structural or functional lesion. The non-stereotyped both face, arm, and leg movements do not have an electrographic correlate. However, it is possible that a focal motor seizures involve 2 small part of the brain to be demonstrated on scalp EEG ENDO 1) Irregular Z-line 2) Small hiatal hernia  GENERAL EXAM: Pleasant middle-aged Caucasian gentleman not in distress. Afebrile. Head is nontraumatic. Neck is supple without bruit.cardiac exam no murmur or gallop. Lungs clear to auscultation. Neurological exam awake alert oriented to time place and person. Speech appears hesitant and slow but no aphasia. No dysarthria. Extraocular movements are full range without nystagmus. He blinks to threat bilaterally. Mild right lower facial weakness. Tongue is midline. Motor system exam reveals mild right-sided drift. Weakness of the right grip intrinsic and muscles. 4/5 strength in right lower extremity. Diminished sensation in the right hemibody. Coordination slow and slightly impaired on the right. Normal strength tone coordination and sensation in the left side. Gait was not tested.  ASSESSMENT [name] is a 66 y.o. male  1. 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  2. 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 ST recleared 10/15/2011 for D3 nectar thick  3. Accelerated HTN- off cardene drip. On oral BP meds 4. Agitation and combativeness: ETOH  withdrawal vs UTI vs. ICH/IVH. On CIWA protocol.  5. UTI - will treat with IV Rocephin 1 gm Q 24 hrs. When taking PO's, will need 7 days total treatment. 6. GIB. EGD with irregular Z line, bx pending.  Small HH,. Treating with anti-reflux regimen. 7. dyslipidemia- check levels  8. etoh abuse  9. chronic pain with excessive tylenol use  10. Tachypnea, resolved 11. Lethargy, agitation. Related to ativan, now resolved, CT, CXT and labs table. Ativan likely led to increased agitation.  Hospital day # 10  TREATMENT/PLAN  -SNF placement. Medically ready for discharge. -add lovenox in hospital, d/c SCDs   Hospital day # [address] Leanora Ivanoff, ANP-BC, GNP-BC [facility] Pager: [phone] 10/18/2011 8:16 AM  Dr. Delia Heady, Stroke Center Medical Director, has personally reviewed chart, pertinent data, examined the patient and developed the plan of care.

## 2011-10-19 NOTE — Progress Notes (Signed)
. Stroke Team Progress Note  HISTORY Carl [name] is an 66 y.o. male white presenting in the ER with sudden onset right hemiparesis and hematemesis. The patient is encephalopathic so most of the history is coming from his family. [name] tells me that the patient was found on the floor by his son earlier tonight around 10:45 pm. The patient had bowel incontinence and had vomited his dinner. [name] and wife tried to put the patient in the shower at which point he started vomiting blood. The family then called 911 and the patient was brought to the ER. The way to the ER and EMS vehicle the patient continued to have bloody vomit. His blood pressures were elevated in the ER and he was aphasic and able to are only a few words. He had right-sided facial droop as well as the right arm and leg weakness. The patient was unable at that point to hold his arm up. During the course of history taking and examination the patient Improved in strength and speech.  according to the patient's wife and son comment the patient takes about 10-20 Tylenol that day for chronic neck pain. He also had bloody vomit last year and was found to have 2 ulcers. However comment that time he did not have any asymmetric weakness normal with the vomiting is excessive.  The patient is a recovering alcoholic and has been going to [employer] The wife is not sure, however, if he has stopped drinking alcohol altogether  SUBJECTIVE Sitting in chair, finished breakfast, looking at the paper.no new complaints  OBJECTIVE Filed Vitals:   10/18/11 2200 10/18/11 2240 10/19/11 0152 10/19/11 0549  BP: 170/88 164/90 105/67 150/86  Pulse: 74 73 70 76  Temp: 98.5 F (36.9 C) 97.8 F (36.6 C) 97.5 F (36.4 C) 98.2 F (36.8 C)  TempSrc: Oral  Oral Oral  Resp: 19 18 20 20   Height:      Weight:      SpO2: 93% 97% 96% 94%    CBG (last 3)  No results found for this basename: GLUCAP:3 in the last 72 hours Intake/Output from  previous day: 02/15 0701 - 02/16 0700 In: 600 [P.O.:600] Out: 550 [Urine:550]  IV Fluid Intake:      . niCARDipine Stopped (10/14/11 1327)  NaCl 0.9% with KCL 20 meq Medications     . atenolol  50 mg Oral BID  . cephALEXin  500 mg Oral Q12H  . cloNIDine  0.1 mg Oral BID  . doxazosin  8 mg Oral QHS  . enoxaparin (LOVENOX) injection  40 mg Subcutaneous Q24H  . folic acid  1 mg Oral Daily  . hydrALAZINE  100 mg Oral TID  . losartan  50 mg Oral Daily  . mulitivitamin with minerals  1 tablet Oral Daily  . pantoprazole  40 mg Oral Q1200  . thiamine  100 mg Oral Daily  PRN acetaminophen, food thickener, haloperidol lactate, labetalol  Diet:  Dysphagia  Activity:  Up with assistance  DVT Prophylaxis: lovenox  Mental Status:  Alert.  Cooperative and attentive.  Expressive aphasia, fractured speech pattern.  Mild dysarthria. Hypophonic.  Follows simple 1-2 step commands.  Able to read large print on paper.  Answers most questions with occasional appropriate response, no spontaneous conversation.  Cranial Nerves:  II-Visual fields grossly intact. III/IV/VI-Extraocular movements intact without nystagmus.  V/VII-mild flattening right nasolabial fold.  VIII-grossly intact  IX/X-normal gag  XII-midline tongue extension  Motor: 3+/5 RUE, 5/5  LUE. 2-3/5 RLE, 5/5 LLE,Slight increased tone on the right.  L grip fairly strong, R present, but weak. Sensory: diminished sensory awareness R. Deep Tendon Reflexes: 2+ and symmetric throughout  Cerebellar: poor coordination on the right.  Significant Diagnostic Studies: Lipid Panel    Component Value Date/Time   CHOL 166 10/12/2011 2331   TRIG 78 10/12/2011 2331   HDL 62 10/12/2011 2331   CHOLHDL 2.7 10/12/2011 2331   VLDL 16 10/12/2011 2331   LDLCALC 88 10/12/2011 2331    HGA1C 10/12/11   5.3  Imaging:  10/11/2011 CT HEAD WITHOUT CONTRAST Findings: Left thalamic intraparenchymal hematoma measures 3.1 x 1.7 cm and demonstrates mild surrounding edema.  This is without significant interval change allowing for differences in slice selection. No evidence for additional bleeding. Hemorrhage now layers dependently within the occipital horns of the lateral ventricles and is again noted to collect within the third ventricle and fourth ventricle. Stable ventricular system size. There is a small amount of subarachnoid blood within sulci on the left, likely related to redistribution of the intraventricular blood. No herniation. Unchanged white matter hypodensities. No evidence for additional acute infarction.The visualized paranasal sinuses and mastoid air cells are predominately clear. IMPRESSION: No significant interval change in the size of the left thalamic intraparenchymal hemorrhage, measuring 3.1 x 1.7 cm in transverse dimension. Redistributing intraventricular blood, small amount now subarachnoid on the left. No overt hydrocephalus. Waneta Martins, M.D.  10/12/11 CT HEAD WITHOUT CONTRAST Comparison: 10/11/2011  Findings: Left thalamic parenchymal hematoma measures 3.0 x 2.4 cm, image 16.  Not significantly changed in size from previous exam. There is mild surrounding edema. Intraventricular hemorrhage is again noted within the third ventricle, occipital horns and fourth  ventricle. This is stable from previous examination. Stable ventricular volumes. No change in white matter hypodensities. No evidence for acute brain infarction or new areas of hemorrhage.  The mastoid air cells are clear. Mild mucosal thickening involving the maxillary sinuses. The skull appears intact. IMPRESSION: 1. Stable appearance of the left thalamic intraparenchymal hematoma.  Rosealee Albee, M.D.  10/15/11 CT HEAD WITHOUT CONTRAST Comparison: 10/12/2011  Findings: Image quality degraded by motion. Left thalamic hemorrhage is unchanged. High density hemorrhage measures 29 x 27 mm with surrounding low density. Minimal intraventricular hemorrhage is improved. No hydrocephalus. No  acute ischemic infarct. No mass lesion  IMPRESSION: Left thalamic hemorrhage is unchanged. Improved intraventricular hemorrhage. No new findings.  Camelia Phenes, M.D.   ASSESSMENT Carl [name] is a 66 y.o. male   1. 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 Hemorrhage secondary to hypertension. Head CT 10/15/11 stable.  2. 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 ST recleared 10/15/2011 for D3 nectar thick. 3. Accelerated HTN- relatively controlled on PO meds. 4. Agitation and combativeness: ETOH withdrawal vs encephalopathy, improving slowly.  CIWA protocol.  5. UTI- on Keflex. 6. GIB secondary to ulcers, EGD with irregular Z line, bx pending. Small HH,. Treating with anti-reflux regimen. 7. dyslipidemia- LDL at goal- 88. 8. etoh abuse  9. chronic pain with excessive tylenol use    TREATMENT/PLAN  Awaiting SNF placement.  If agitated, use Haldol, ativan apparently caused increased agitation hence plan to taper and stop in 3 weeks. Hospital day # [address] PA-C [facility] [phone] 10/19/2011 9:10 AM I have personally examined this patient, participated in and plan of care and agree with the above plan Delia Heady, MD

## 2011-10-19 NOTE — Progress Notes (Deleted)
Occupational Therapy Treatment Patient Details Name: Carl [name] MRN: [id number] DOB: 1945-09-09 Today's Date: 10/19/2011  OT Assessment/Plan   OT Goals Acute Rehab OT Goals OT Goal Formulation: With patient/family Time For Goal Achievement: 2 weeks ADL Goals Pt Will Perform Grooming: with set-up;Sitting, chair;Unsupported ADL Goal: Grooming - Progress: Progressing toward goals Pt Will Perform Upper Body Bathing: with set-up;Sitting, chair;Supported ADL Goal: Product manager - Progress: Not met Pt Will Perform Lower Body Bathing: with mod assist;Sit to stand from chair;Sit to stand from bed;Supported ADL Goal: Lower Body Bathing - Progress: Not met Pt Will Perform Upper Body Dressing: with set-up;Sitting, chair;Supported ADL Goal: Location manager Dressing - Progress: Not met Pt Will Perform Lower Body Dressing: with mod assist;Sit to stand from chair;Sit to stand from bed;Supported ADL Goal: Lower Body Dressing - Progress: Not met Pt Will Transfer to Toilet: with supervision;3-in-1;Ambulation ADL Goal: Toilet Transfer - Progress: Progressing toward goals Miscellaneous OT Goals Miscellaneous OT Goal #1: Pt will place 4 out 6 objects in container on first try without undershooting demonstrating improvement in motor planning as precursor to sink level ADLS OT Goal: Miscellaneous Goal #1 - Progress: Not met  OT Treatment Precautions/Restrictions  Precautions Precautions: Fall Restrictions Weight Bearing Restrictions: No   ADL ADL Toileting - Clothing Manipulation: Performed;Minimal assistance Toileting - Clothing Manipulation Details (indicate cue type and reason): With Optician, dispensing - Hygiene: Performed;Minimal assistance Toileting - Hygiene Details (indicate cue type and reason): min assist with use of urinal  Where Assessed - Toileting Hygiene: Sit to stand from 3-in-1 or toilet ADL Comments: Pt. presented sitting EOB and with use of urinal. Pt. provided with min assist  for managing urinal EOB to prevent spilling. Pt. completed sit-stand with mod assist and min verbal cues for hand placement on RW and rt. knee blocked to prevent buckling. pt. able to maintain standing ~30 secs prior to needing to sit. Mobility  Bed Mobility Bed Mobility: Yes Sit to Supine: 5: Supervision;HOB elevated (comment degrees) Transfers Sit to Stand: 3: Mod assist;With upper extremity assist;From bed Exercises    End of Session General Behavior During Session: [facility] for tasks performed Cognition: Impaired Cognitive Impairment: decreased processing, motor planning deficits.    Carl [name]  10/19/2011, 1:25 PM

## 2011-10-19 NOTE — Progress Notes (Signed)
Occupational Therapy Treatment Patient Details Name: Carl [name] MRN: [id number] DOB: [birth year] Today's Date: 10/19/2011  OT Assessment/Plan OT Assessment/Plan Comments on Treatment Session: Pt. with improved function this session and improved control of rt. UE with use during ADL tasks. OT Plan: Discharge plan remains appropriate OT Frequency: Min 2X/week Recommendations for Other Services: Rehab consult Follow Up Recommendations: Inpatient Rehab Equipment Recommended: Defer to next venue OT Goals Acute Rehab OT Goals OT Goal Formulation: With patient/family Time For Goal Achievement: 2 weeks ADL Goals Pt Will Perform Grooming: with set-up;Sitting, chair;Unsupported ADL Goal: Grooming - Progress: Progressing toward goals Pt Will Perform Upper Body Bathing: with set-up;Sitting, chair;Supported ADL Goal: Product manager - Progress: Not met Pt Will Perform Lower Body Bathing: with mod assist;Sit to stand from chair;Sit to stand from bed;Supported ADL Goal: Lower Body Bathing - Progress: Not met Pt Will Perform Upper Body Dressing: with set-up;Sitting, chair;Supported ADL Goal: Location manager Dressing - Progress: Not met Pt Will Perform Lower Body Dressing: with mod assist;Sit to stand from chair;Sit to stand from bed;Supported ADL Goal: Lower Body Dressing - Progress: Not met Pt Will Transfer to Toilet: with supervision;3-in-1;Ambulation ADL Goal: Toilet Transfer - Progress: Progressing toward goals Miscellaneous OT Goals Miscellaneous OT Goal #1: Pt will place 4 out 6 objects in container on first try without undershooting demonstrating improvement in motor planning as precursor to sink level ADLS OT Goal: Miscellaneous Goal #1 - Progress: Not met  OT Treatment Precautions/Restrictions  Precautions Precautions: Fall Restrictions Weight Bearing Restrictions: No   ADL ADL Toileting - Clothing Manipulation: Performed;Minimal assistance Toileting - Clothing Manipulation Details  (indicate cue type and reason): With Optician, dispensing - Hygiene: Performed;Minimal assistance Toileting - Hygiene Details (indicate cue type and reason): min assist with use of urinal  Where Assessed - Toileting Hygiene: Sit to stand from 3-in-1 or toilet ADL Comments: Pt. presented sitting EOB and with use of urinal. Pt. provided with min assist for managing urinal EOB to prevent spilling. Pt. completed sit-stand with mod assist and min verbal cues for hand placement on RW and rt. knee blocked to prevent buckling. pt. able to maintain standing ~30 secs prior to needing to sit. Mobility  Bed Mobility Bed Mobility: Yes Sit to Supine: 5: Supervision;HOB elevated (comment degrees) Transfers Sit to Stand: 3: Mod assist;With upper extremity assist;From bed     End of Session General Behavior During Session: [facility] for tasks performed Cognition: Impaired Cognitive Impairment: decreased processing, motor planning deficits.    Tessa Seaberry, OTR/L Pager [phone]  10/19/2011, 1:26 PM

## 2011-10-20 ENCOUNTER — Inpatient Hospital Stay (HOSPITAL_COMMUNITY): Payer: Medicare Other

## 2011-10-20 LAB — DIFFERENTIAL
Eosinophils Absolute: 0.1 10*3/uL (ref 0.0–0.7)
Eosinophils Relative: 1 % (ref 0–5)
Lymphs Abs: 0.5 10*3/uL — ABNORMAL LOW (ref 0.7–4.0)
Monocytes Absolute: 0.8 10*3/uL (ref 0.1–1.0)
Monocytes Relative: 11 % (ref 3–12)

## 2011-10-20 LAB — CBC
HCT: 32.8 % — ABNORMAL LOW (ref 39.0–52.0)
MCH: 32.5 pg (ref 26.0–34.0)
MCV: 94.3 fL (ref 78.0–100.0)
Platelets: 286 10*3/uL (ref 150–400)
RBC: 3.48 MIL/uL — ABNORMAL LOW (ref 4.22–5.81)

## 2011-10-20 MED ORDER — COLCHICINE 0.6 MG PO TABS
0.6000 mg | ORAL_TABLET | Freq: Two times a day (BID) | ORAL | Status: DC
Start: 2011-10-20 — End: 2011-10-21
  Administered 2011-10-20 – 2011-10-21 (×3): 0.6 mg via ORAL
  Filled 2011-10-20 (×5): qty 1

## 2011-10-20 NOTE — Progress Notes (Signed)
Speech Language Pathology  Received MBSS order.  Will complete in a.m. On 10/21/11.  Thank you for this order.  Myra Rude, M.S.,CCC-SLP Pager [phone]

## 2011-10-20 NOTE — Progress Notes (Signed)
. Stroke Team Progress Note  HISTORY Carl [name] is an 66 y.o. male white presenting in the ER with sudden onset right hemiparesis and hematemesis. The patient is encephalopathic so most of the history is coming from his family. [name] tells me that the patient was found on the floor by his son earlier tonight around 10:45 pm. The patient had bowel incontinence and had vomited his dinner. [name] and wife tried to put the patient in the shower at which point he started vomiting blood. The family then called 911 and the patient was brought to the ER. The way to the ER and EMS vehicle the patient continued to have bloody vomit. His blood pressures were elevated in the ER and he was aphasic and able to are only a few words. He had right-sided facial droop as well as the right arm and leg weakness. The patient was unable at that point to hold his arm up. During the course of history taking and examination the patient Improved in strength and speech.  according to the patient's wife and son comment the patient takes about 10-20 Tylenol that day for chronic neck pain. He also had bloody vomit last year and was found to have 2 ulcers. However comment that time he did not have any asymmetric weakness normal with the vomiting is excessive.  The patient is a recovering alcoholic and has been going to [employer] The wife is not sure, however, if he has stopped drinking alcohol altogether  SUBJECTIVE Sitting in bed.  C/o R wrist and knee pain.he has h/o gout in past  OBJECTIVE Filed Vitals:   10/19/11 1700 10/19/11 2200 10/20/11 0253 10/20/11 0600  BP: 172/98 170/94 133/67 158/88  Pulse: 84 75 81 60  Temp: 97.9 F (36.6 C) 98.9 F (37.2 C) 98.7 F (37.1 C) 98.5 F (36.9 C)  TempSrc: Oral Oral Oral Oral  Resp: 18 20 20 20   Height:      Weight:      SpO2: 95% 95% 92% 91%    CBG (last 3)  No results found for this basename: GLUCAP:3 in the last 72 hours Intake/Output from  previous day: 02/16 0701 - 02/17 0700 In: 840 [P.O.:840] Out: 800 [Urine:800]  IV Fluid Intake:      . DISCONTD: niCARDipine Stopped (10/14/11 1327)  NaCl 0.9% with KCL 20 meq Medications     . atenolol  50 mg Oral BID  . cephALEXin  500 mg Oral Q12H  . cloNIDine  0.1 mg Oral BID  . doxazosin  8 mg Oral QHS  . enoxaparin (LOVENOX) injection  40 mg Subcutaneous Q24H  . folic acid  1 mg Oral Daily  . hydrALAZINE  100 mg Oral TID  . losartan  50 mg Oral Daily  . mulitivitamin with minerals  1 tablet Oral Daily  . pantoprazole  40 mg Oral Q1200  . thiamine  100 mg Oral Daily  PRN acetaminophen, food thickener, haloperidol lactate, labetalol  Diet:  Dysphagia  Activity:  Up with assistance  DVT Prophylaxis: lovenox  Mental Status:  Alert.  Cooperative and attentive.  Expressive aphasia, fractured speech pattern.  Mild dysarthria. Hypophonic.  Follows 2 step commands. More conversant this am.  Initiates greeting.  Clearly trying to express thoughts, gets frustrated when can't.  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    Cranial Nerves:  II-Visual fields grossly intact. III/IV/VI-Extraocular movements intact without nystagmus.  V/VII-mild flattening right  nasolabial fold.  VIII-grossly intact  IX/X-normal gag  XII-midline tongue extension  Motor: 4+/5 RUE, 5/5 LUE. 3/5 RLE, 5/5 LLE,Slight increased tone on the right.  L grip fairly strong, R present, but weak. Sensory: diminished sensory awareness R. Deep Tendon Reflexes:1+ R U/LE, 2+ LU/LE  Cerebellar: poor coordination on the right.  * R wrist swollen , erythematous and warm with diminished ROM and tenderness to palpation,  R knee swollen and decreased ROM, tender without erythema, increased warmth or effusion apparent.   Significant Diagnostic Studies: Lipid Panel    Component Value Date/Time   CHOL 166 10/12/2011 2331   TRIG 78 10/12/2011 2331   HDL 62 10/12/2011 2331   CHOLHDL  2.7 10/12/2011 2331   VLDL 16 10/12/2011 2331   LDLCALC 88 10/12/2011 2331    HGA1C 10/12/11   5.3  Imaging:  10/11/2011 CT HEAD WITHOUT CONTRAST Findings: Left thalamic intraparenchymal hematoma measures 3.1 x 1.7 cm and demonstrates mild surrounding edema. This is without significant interval change allowing for differences in slice selection. No evidence for additional bleeding. Hemorrhage now layers dependently within the occipital horns of the lateral ventricles and is again noted to collect within the third ventricle and fourth ventricle. Stable ventricular system size. There is a small amount of subarachnoid blood within sulci on the left, likely related to redistribution of the intraventricular blood. No herniation. Unchanged white matter hypodensities. No evidence for additional acute infarction.The visualized paranasal sinuses and mastoid air cells are predominately clear. IMPRESSION: No significant interval change in the size of the left thalamic intraparenchymal hemorrhage, measuring 3.1 x 1.7 cm in transverse dimension. Redistributing intraventricular blood, small amount now subarachnoid on the left. No overt hydrocephalus. Waneta Martins, M.D.  10/12/11 CT HEAD WITHOUT CONTRAST Comparison: 10/11/2011  Findings: Left thalamic parenchymal hematoma measures 3.0 x 2.4 cm, image 16.  Not significantly changed in size from previous exam. There is mild surrounding edema. Intraventricular hemorrhage is again noted within the third ventricle, occipital horns and fourth  ventricle. This is stable from previous examination. Stable ventricular volumes. No change in white matter hypodensities. No evidence for acute brain infarction or new areas of hemorrhage.  The mastoid air cells are clear. Mild mucosal thickening involving the maxillary sinuses. The skull appears intact. IMPRESSION: 1. Stable appearance of the left thalamic intraparenchymal hematoma.  Rosealee Albee, M.D.  10/15/11 CT HEAD WITHOUT  CONTRAST Comparison: 10/12/2011  Findings: Image quality degraded by motion. Left thalamic hemorrhage is unchanged. High density hemorrhage measures 29 x 27 mm with surrounding low density. Minimal intraventricular hemorrhage is improved. No hydrocephalus. No acute ischemic infarct. No mass lesion  IMPRESSION: Left thalamic hemorrhage is unchanged. Improved intraventricular hemorrhage. No new findings.  Camelia Phenes, M.D.   ASSESSMENT Carl [name] is a 66 y.o. male  1. 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 Hemorrhage secondary to hypertension. Head CT 10/15/11 stable.  2. Dysphagia, right hemiparesis and expressive aphasia secondary to hemorrhage. ST recleared 10/15/2011 for D3 nectar thick. 3. Accelerated HTN- relatively controlled on PO meds. 4. Agitation and combativeness improving. ETOH withdrawal vs encephalopathy, improving slowly.  CIWA protocol.  5. UTI- on Keflex. 6. GIB secondary to ulcers, EGD with irregular Z line, bx pending. Small HH,. Treating with anti-reflux regimen. 7. dyslipidemia- LDL at goal- 88. 8. etoh abuse-discussed negative consequenses of continued consumption and need to abstain.  Pt was able to verbalize understanding. 9. R wrist and knee pain- check xray and  uric acid levels.   TREATMENT/PLAN  1. XR R wrist and R knee 2. Uric acid level, CBC 3. Awaiting SNF placement.  If agitated, use Haldol, ativan apparently caused increased agitation hence plan to taper and stop in 3 weeks.  4. Colchicine 0.6 mg BID x 2 days, if responds, add allopurinol 100 mg QD. Hospital day # [address]  Marya Fossa PA-C [facility] [phone] 10/20/2011 8:59 AM  I have personally examined this patient, participated in and plan of care and agree with the above plan Delia Heady, MD

## 2011-10-21 ENCOUNTER — Inpatient Hospital Stay (HOSPITAL_COMMUNITY): Payer: Medicare Other

## 2011-10-21 MED ORDER — COLCHICINE 0.6 MG PO TABS: 0.6000 mg | ORAL_TABLET | Freq: Every day | ORAL | Status: DC

## 2011-10-21 MED ORDER — COLCHICINE 0.6 MG PO TABS: 0.6000 mg | ORAL_TABLET | Freq: Two times a day (BID) | ORAL | Status: DC

## 2011-10-21 MED ORDER — PANTOPRAZOLE SODIUM 40 MG PO TBEC: 40.0000 mg | DELAYED_RELEASE_TABLET | Freq: Every day | ORAL | Status: DC

## 2011-10-21 NOTE — Progress Notes (Signed)
Pt's wife declined [employer]. CSW expanded search to [facility]. Pt's wife accepted bed offer from [facility]. CSW contacted [facility] Medicare to follow up on prior auth for SNF. CSW contacted facility to initiate transfer. CSW will continue to follow to facilitate discharge to SNF today, 10/21/11.   Dede Query, MSW, [facility]ors [phone]

## 2011-10-21 NOTE — Progress Notes (Signed)
CSW met with pt indendently to address substance abuse concerns. Pt did not report any current ETOH use in the last year when the SBIRT was used, however to shared that he did have periods of ETOH use and periods of sobriety in the past 2 years. Pt shared that he has tried to stay sober on his own, but since he was unsuccessful, he reported that he needs the support of AA and his family. Pt reported that he has been to AA meetings in the past and would be interested in attending them once more. CSW provided resources for pt obtain substance abuse treatment in his county once he leaves SNF. Pt was accepting of resources and shared them with his wife. CSW will continue to follow to facilitate discharge.   Dede Query, MSW, [facility] [phone]

## 2011-10-21 NOTE — Progress Notes (Signed)
Occupational Therapy Treatment Patient Details Name: Carl [name] MRN: [id number] DOB: 20-Sep-1945 Today's Date: 10/21/2011  OT Assessment/Plan OT Assessment/Plan Comments on Treatment Session: Pt with increase Rt UE  this session. Pt much more alert and following simple 2 step commands. Pt with deficits when challenged with two step commands. OT Plan: Discharge plan needs to be updated OT Frequency: Min 2X/week Follow Up Recommendations: Skilled nursing facility Equipment Recommended: Defer to next venue OT Goals Acute Rehab OT Goals OT Goal Formulation: With patient Time For Goal Achievement: 2 weeks ADL Goals Pt Will Perform Grooming: Standing at sink;Unsupported;with supervision ADL Goal: Grooming - Progress: Updated due to goal met Pt Will Perform Upper Body Bathing: with set-up;Sitting, chair;Supported ADL Goal: Product manager - Progress: Goal set today Pt Will Perform Lower Body Bathing: with mod assist;Sit to stand from chair;Sit to stand from bed;Supported ADL Goal: Lower Body Bathing - Progress: Goal set today Pt Will Perform Upper Body Dressing: with min assist;Sit to stand from chair (ambulate to obtain item) ADL Goal: Upper Body Dressing - Progress: Updated due to goal met Pt Will Perform Lower Body Dressing: with mod assist;Sit to stand from chair;Sit to stand from bed;Supported ADL Goal: Lower Body Dressing - Progress: Goal set today Pt Will Transfer to Toilet: with supervision;3-in-1;Ambulation ADL Goal: Toilet Transfer - Progress: Goal set today Miscellaneous OT Goals Miscellaneous OT Goal #1: Pt will place 4 out 6 objects in container on first try without undershooting demonstrating improvement in motor planning as precursor to sink level ADLS OT Goal: Miscellaneous Goal #1 - Progress: Met  OT Treatment Precautions/Restrictions  Precautions Required Braces or Orthoses: No Restrictions Weight Bearing Restrictions: No   ADL ADL Eating/Feeding:  Performed;Supervision/safety Eating/Feeding Details (indicate cue type and reason): Pt drinking with Rt hand and taking pills with Lt UE at EOB unsupported. Pt with improved motor control. Pt still presents with proprioception deficits Where Assessed - Eating/Feeding: Edge of bed Grooming: Performed;Wash/dry face;Shaving;Moderate assistance Grooming Details (indicate cue type and reason): Pt stood at sink picking up items with Rt hand and Lt hand placed on sink level for support. Pt initially widened base of support at sink. Pt then narrowed based of support and used hip strategies (flexion) to support self against the sink . Pt with poor ankle strategies at sink level to adjust balance. Pt turning on razor with two hand (A). Pt switching hands with objects with unsteadiness. Pt using BIL UE to straighten hair down and increased hip flexion to prop against sink level.  Where Assessed - Grooming: Standing at sink Upper Body Dressing: Performed;Supervision/safety Upper Body Dressing Details (indicate cue type and reason): don gown as robe Where Assessed - Upper Body Dressing: Sitting, bed;Unsupported Equipment Used: Rolling walker (ace wrap on Rt Knee and Rt Wrist. Pt reports decr pain) Ambulation Related to ADLs: Pt ambulated with heavy Min (A) from room to nurses station. (see PT Dahlia Client note for details) Pt with fatigue holding onto RW and decreased grasp Rt hand falling off walker. Pt required tactile cues and mod v/c for sequence with ambulation ADL Comments: Pt completed sit<> stand from EOB to sink level. Pt with much more attention to detail with shaving face. pt using mirror to scan face and feeling skin with Lt hand for missed hairs. pt required some assistance with completing task to shave small clusters of hair. Pt using good problem solving and asking therapist "did I do okay" to confirm all facial hairs were shaved. Mobility  Bed Mobility Bed  Mobility: Yes Rolling Right: 5:  Supervision Rolling Right Details (indicate cue type and reason): pt initiating with Rt side today Supine to Sit: 5: Supervision;HOB flat Supine to Sit Details (indicate cue type and reason): Pt initiating with Rt side to long sit and then using BIL UE to support trunk and scoot to EOB. pt reporting no pain in Rt wrist today with bed mobility Sitting - Scoot to Edge of Bed: 5: Supervision Scooting to [facility]: 5: Supervision Transfers Transfers: Yes Sit to Stand: 1: +2 Total assist;Patient percentage (comment);With upper extremity assist;From bed (PT 80%., tactile facilitation for pelvic tilt and hip flexio) Sit to Stand Details (indicate cue type and reason): Pt with much improved body mechanics with mod tactile cue to facilitate Stand to Sit: 3: Mod assist;With upper extremity assist;To chair/3-in-1;With armrests Stand to Sit Details: Pt with uncontrolled descend to chair. pt with hands remaining on RW with descend and max v/c to reach for arm rest. Exercises    End of Session OT - End of Session Equipment Utilized During Treatment: Gait belt;Other (comment) (ace wraps Rt wrist and knee) Activity Tolerance: Patient tolerated treatment well Patient left: in chair;with call bell in reach Nurse Communication: Mobility status for transfers;Mobility status for ambulation General Behavior During Session: [facility] for tasks performed Cognition: Impaired Cognitive Impairment: demonstrating improvements but with delayed processing and deficits with problem solving  Carl, [name]  10/21/2011, 11:13 AM Pager: [phone]

## 2011-10-21 NOTE — Progress Notes (Signed)
Stroke Team Progress Note  HISTORY Carl [name] is an 66 y.o. male white presenting in the ER with sudden onset right hemiparesis and hematemesis. The patient is encephalopathic so most of the history is coming from his family. [name] tells me that the patient was found on the floor by his son 10/07/2011 earlier tonight around 10:45 pm. The patient had bowel incontinence and had vomited his dinner. [name] and wife tried to put the patient in the shower at which point he started vomiting blood. The family then called 911 and the patient was brought to the ER. The way to the ER and EMS vehicle the patient continued to have bloody vomit. His blood pressures were elevated in the ER and he was aphasic and able to are only a few words. He had right-sided facial droop as well as the right arm and leg weakness. The patient was unable at that point to hold his arm up. During the course of history taking and examination the patient Improved in strength and speech.  according to the patient's wife and son comment the patient takes about 10-20 Tylenol that day for chronic neck pain. He also had bloody vomit last year and was found to have 2 ulcers. However comment that time he did not have any asymmetric weakness normal with the vomiting is excessive.  The patient is a recovering alcoholic and has been going to [employer] The wife is not sure, however, if he has stopped drinking alcohol altogether  SUBJECTIVE No family is at the bedside. Overall he feels his condition is unchanged. still with right wrist swelling but pain is significantly decreased. No new complaints today  OBJECTIVE Filed Vitals:   10/20/11 1748 10/20/11 1942 10/20/11 2203 10/21/11 0434  BP: 135/82 151/91 159/77 150/90  Pulse: 72 73 63 95  Temp: 98.2 F (36.8 C) 98.1 F (36.7 C)  97.6 F (36.4 C)  TempSrc: Oral Oral  Oral  Resp: 18 18  18   Height:      Weight:      SpO2: 95% 91%  94%   CBG (last 3) No results  found for this basename: GLUCAP:3 in the last 72 hours Intake/Output from previous day: 02/17 0701 - 02/18 0700 In: -  Out: 500 [Urine:500]  IV Fluid Intake:    Medications    . atenolol  50 mg Oral BID  . cephALEXin  500 mg Oral Q12H  . cloNIDine  0.1 mg Oral BID  . colchicine  0.6 mg Oral BID  . doxazosin  8 mg Oral QHS  . enoxaparin (LOVENOX) injection  40 mg Subcutaneous Q24H  . folic acid  1 mg Oral Daily  . hydrALAZINE  100 mg Oral TID  . losartan  50 mg Oral Daily  . mulitivitamin with minerals  1 tablet Oral Daily  . pantoprazole  40 mg Oral Q1200  . thiamine  100 mg Oral Daily  PRN acetaminophen, food thickener, haloperidol lactate, labetalol  Diet:  Dysphagia 3 nectar thick liquids Activity:   Up with assistance DVT Prophylaxis:  Lovenox 40 mg sq daily   Significant Diagnostic Studies: CBC    Component Value Date/Time   WBC 7.1 10/20/2011 1100   RBC 3.48* 10/20/2011 1100   HGB 11.3* 10/20/2011 1100   HCT 32.8* 10/20/2011 1100   PLT 286 10/20/2011 1100   MCV 94.3 10/20/2011 1100   MCH 32.5 10/20/2011 1100   MCHC 34.5 10/20/2011 1100   RDW 13.0 10/20/2011 1100  LYMPHSABS 0.5* 10/20/2011 1100   MONOABS 0.8 10/20/2011 1100   EOSABS 0.1 10/20/2011 1100   BASOSABS 0.0 10/20/2011 1100   CMP    Component Value Date/Time   NA 142 10/15/2011 1205   K 3.8 10/15/2011 1205   CL 106 10/15/2011 1205   CO2 24 10/15/2011 1205   GLUCOSE 127* 10/15/2011 1205   BUN 19 10/15/2011 1205   CREATININE 0.85 10/15/2011 1205   CALCIUM 9.5 10/15/2011 1205   PROT 7.6 10/13/2011 1510   ALBUMIN 3.7 10/13/2011 1510   AST 17 10/13/2011 1510   ALT 16 10/13/2011 1510   ALKPHOS 39 10/13/2011 1510   BILITOT 1.1 10/13/2011 1510   GFRNONAA 89* 10/15/2011 1205   GFRAA >90 10/15/2011 1205   COAGS Lab Results  Component Value Date   INR 0.98 10/08/2011   Lipid Panel    Component Value Date/Time   CHOL 166 10/12/2011 2331   TRIG 78 10/12/2011 2331   HDL 62 10/12/2011 2331   CHOLHDL 2.7 10/12/2011 2331   VLDL 16  10/12/2011 2331   LDLCALC 88 10/12/2011 2331   HgbA1C  Lab Results  Component Value Date   HGBA1C 5.3 10/12/2011   Urine Drug Screen     Component Value Date/Time   LABOPIA NONE DETECTED 10/08/2011 0245   COCAINSCRNUR NONE DETECTED 10/08/2011 0245   LABBENZ NONE DETECTED 10/08/2011 0245   AMPHETMU NONE DETECTED 10/08/2011 0245   THCU NONE DETECTED 10/08/2011 0245   LABBARB NONE DETECTED 10/08/2011 0245    Alcohol Level No results found for this basename: eth   Uric Acid 4.2  Dg Wrist Complete Right  Dorsal soft tissue swelling without fracture or other bony abnormality    Dg Knee 1-2 Views Right Effusion without fracture or other acute bony abnormality.    CT of the brain  10/12/2011 Stable appearance of the left thalamic intraparenchymal hematoma.  10/11/2011 No significant interval change in the size of the left thalamic intraparenchymal hemorrhage, measuring 3.1 x 1.7 cm in transverse dimension. Redistributing intraventricular blood, small amount now subarachnoid on the left. No overt hydrocephalus.  10/09/2011 No change in the left thalamic hematoma with intraventricular penetration. Axial measurement of 30 x 18 mm.  10/08/2011 Stable appearance of large left thalamic hemorrhagic infarct with intra-articular extension of hemorrhage. No significant interval change.  10/08/2011 Left thalamic/basal ganglia intraparenchymal hemorrhage, with extension into the ventricular system.  MRI of the brain  MRA of the brain  2D Echocardiogram EF 55-60% with no source of embolus.  Carotid Doppler No internal carotid artery stenosis bilaterally. Vertebrals with antegrade flow bilaterally.  CXR 10/08/2011 1. Low volumes with mild bilateral edema or infiltrates.  10/12/2011 Cardiac enlargement with vascular congestion. Mild bibasilar atelectasis, slightly improved from the prior study.  EKG sinus rhythm  EEG This routine EEG done with the patient awake  and drowsy is abnormal. Background activities across right  hemisphere is slightly too slow suggesting a mild-to-moderate encephalopathy of nonspecific etiology. The additional slowing across the left hemisphere in the delta range with paucity of faster activity suggest an underlying structural or functional lesion. The non-stereotyped both face, arm, and leg movements do not have an electrographic correlate. However, it is possible that a focal motor seizures involve 2 small part of the brain to be demonstrated on scalp EEG  ENDO 1) Irregular Z-line 2) Small hiatal hernia  Physical Exam  Mental Status:  Alert. Cooperative and attentive. Expressive aphasia, fractured speech pattern. Mild dysarthria. Hypophonic. Follows 2 step commands. More  conversant this am. Initiates greeting. Clearly trying to express thoughts, gets frustrated when can't. 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  Cranial Nerves:  II-Visual fields grossly intact.  III/IV/VI-Extraocular movements intact without nystagmus.  V/VII-mild flattening right nasolabial fold.  VIII-grossly intact  IX/X-normal gag  XII-midline tongue extension  Motor: 4+/5 RUE, 5/5 LUE. 3/5 RLE, 5/5 LLE,Slight increased tone on the right. L grip fairly strong, R present, but weak.  Sensory: diminished sensory awareness R.  Deep Tendon Reflexes:1+ R U/LE, 2+ LU/LE  Cerebellar: poor coordination on the right.  * R wrist swollen , but decreased erythema  and warmth today with diminished ROM and no tenderness to palpation, R knee swollen and decreased ROM, tender without erythema, increased warmth or effusion apparent.   ASSESSMENT Carl [name] is a 66 y.o. 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 Hemorrhage secondary to hypertension. Head CT 10/15/11 stable.   2. Dysphagia, right hemiparesis and expressive aphasia secondary to hemorrhage. ST recleared 10/15/2011 for D3 nectar thick.  3. Accelerated HTN-  relatively controlled on PO meds.  4. Agitation and combativeness improving. ETOH withdrawal vs encephalopathy, improving slowly. CIWA protocol.  5. UTI- on Keflex.  6. GIB secondary to ulcers, EGD with irregular Z line, bx pending. Small HH,. Treating with anti-reflux regimen.  7. dyslipidemia- LDL at goal- 88.  8. etoh abuse-discussed negative consequenses of continued consumption and need to abstain. Pt was able to verbalize understanding.  9. R wrist and knee pain- improved on colchicine but normal uric acid levels and xray  Hospital day # 13  TREATMENT/PLAN -continue colchicine 0.6 bid today then daily starting tomorrow -stop keflex, has had abx x 9 days -modified barium to reassess swallow -await SNF bed placement. Medically ready for discharge  Joaquin Music, ANP-BC, GNP-BC [facility] Pager: [phone] 10/21/2011 8:15 AM  Dr. Delia Heady, Stroke Center Medical Director, has personally reviewed chart, pertinent data, examined the patient and developed the plan of care.

## 2011-10-21 NOTE — Progress Notes (Signed)
Utilization review completed. Rui Wordell, RN, BSN.  10/21/11  

## 2011-10-21 NOTE — Procedures (Addendum)
Modified Barium Swallow Procedure Note Patient Details  Name: Carl [name] MRN: [id number] Date of Birth: Dec 20, 1945  Today's Date: 10/21/2011 Time:  -     Past Medical History:  Past Medical History  Diagnosis Date  . Gout   . Hypertension   . Osteoarthritis     neck  . Impaired fasting glucose   . Alcoholism     still drinking wine as of Feb 2013  . Gastric ulcer 2011    egd at [facility] Aug 2011.  . Duodenal ulcer 2011   Past Surgical History:  Past Surgical History  Procedure Date  . Cystogram 03/08   HPI:  66 yr old admitted with acute right sided weakness.  CT revealed large left thalamic hemorrhage.  Initial MBS recommended nectar thick and Dys 3 due to penetration with thin.  Repeat MBS for possibillity of returning to thin liquids.     Recommendation/Prognosis  Clinical Impression Dysphagia Diagnosis: Mild oral phase dysphagia;Mild pharyngeal phase dysphagia (APPEARED TO HAVE DECREASED ESOPHAGEAL MOTILITY) Clinical impression: Pt. exhibited mild improvements in swallow fucntion since initiatl MBS 10/09/11.  A mild sensori-motor oral dysphagia observed marked by decreased lingual ROM and strength resulting in lingual residue and piecemeal swallow pattern with residue spilling post swalllow to pyriform sinuses x 1.  Mild sensori-motor pharyngeal dysphagia observed characterized by decreased sensation leading to delayed swallow initiation with laryngeal penetration of thin barium (silent) to above vocal cord level.  Pt. cued to take smaller sips decreasing chance of material to enter the vestibule that was effective.  Esophageal scan revealed slow transit of po's through LES which eventually cleared by end of assessment.  Regular diet and thin liquids with strict adherence to swallow precautions is recommended (small sips, no straws).     Swallow Evaluation Recommendations Solid Consistency: Regular Liquid Consistency: Thin Liquid Administration via: Cup;No  straw Medication Administration: Whole meds with puree Supervision: Full supervision/cueing for compensatory strategies Compensations: Slow rate;Small sips/bites;Clear throat intermittently;Follow solids with liquid (no straws) Postural Changes and/or Swallow Maneuvers: Seated upright 90 degrees;Upright 30-60 min after meal Follow up Recommendations: None Prognosis Prognosis for Safe Diet Advancement: Good Individuals Consulted Consulted and Agree with Results and Recommendations: Patient  SLP Assessment/Plan   SLP Goals  SLP Swallowing Goals Patient will consume recommended diet without observed clinical signs of aspiration with: Minimal assistance Patient will utilize recommended strategies during swallow to increase swallowing safety with: Minimal assistance  General:  Type of Study: Repeat MBS Diet Prior to this Study: Nectar-thick liquids;Dysphagia 3 (soft) Respiratory Status: Room air Behavior/Cognition: Cooperative (slightly lethargic) Oral Cavity - Dentition: Adequate natural dentition Oral Motor / Sensory Function: Impaired - see Bedside swallow eval Vision: Functional for self-feeding Patient Positioning: Upright in chair Baseline Vocal Quality: Clear;Low vocal intensity Volitional Cough: Strong Volitional Swallow: Able to elicit Anatomy: Within functional limits Pharyngeal Secretions: Not observed secondary MBS  Oral Phase Oral Preparation/Oral Phase Oral Phase: Impaired Oral - Nectar Oral - Nectar Cup: Lingual/palatal residue;Piecemeal swallowing Oral - Thin Oral - Thin Teaspoon: Lingual/palatal residue;Piecemeal swallowing Oral - Thin Cup: Lingual/palatal residue;Piecemeal swallowing Pharyngeal Phase  Pharyngeal Phase Pharyngeal Phase: Impaired Pharyngeal - Pudding Pharyngeal - Pudding Teaspoon: Not tested Pharyngeal - Nectar Pharyngeal - Nectar Teaspoon: Delayed swallow initiation;Premature spillage to pyriform;Reduced tongue base retraction;Pharyngeal  residue - valleculae;Pharyngeal residue - pyriform (TRACE RESIDUAL) Pharyngeal - Nectar Cup: Pharyngeal residue - pyriform;Pharyngeal residue - valleculae;Delayed swallow initiation;Premature spillage to pyriform;Reduced tongue base retraction Pharyngeal - Thin Pharyngeal - Thin Teaspoon: Delayed swallow  initiation;Premature spillage to valleculae;Reduced tongue base retraction;Pharyngeal residue - valleculae (TRACE VALL RESIDUE) Pharyngeal - Thin Cup: Delayed swallow initiation;Premature spillage to valleculae;Reduced laryngeal elevation;Reduced tongue base retraction;Pharyngeal residue - valleculae;Pharyngeal residue - pyriform;Penetration/Aspiration during swallow (SILENT PENETRATION, ALSO LINGUAL RESIDUE SPILL TO PYRIFORM S) Penetration/Aspiration details (thin cup): Material enters airway, remains ABOVE vocal cords and not ejected out Pharyngeal - Solids Pharyngeal - Puree: Not tested Pharyngeal - Regular: Within functional limits Cervical Esophageal Phase  Cervical Esophageal Phase Cervical Esophageal Phase: [facility] Cervical Esophageal Phase - Comment Cervical Esophageal Comment: Appeared to have slow transit to LES.    Breck Coons [place].Ed [employer] [phone]  10/21/2011

## 2011-10-21 NOTE — Progress Notes (Signed)
Pt is ready for discharge today to [facility]. CSW obtained prior auth for SNF and ambulance auth for transport. Pt and pt's wife are agreeable to discharge plan. [facility] will provide transportation. Facility is ready to accept pt. CSW is signing off as no further clinical social work needs identified.   Dede Query, MSW, Theresia Majors [phone]

## 2011-10-21 NOTE — Progress Notes (Signed)
Physical Therapy Treatment Patient Details Name: Carl [name] MRN: [id number] DOB: 1946-06-28 Today's Date: 10/21/2011  PT Assessment/Plan  PT - Assessment/Plan Comments on Treatment Session: Pt making great improvement today ambulating 94 feet. Pt reports knee and wrist pain much improved. Pt continues to be very motivated to improve. PT Plan: Discharge plan remains appropriate;Frequency remains appropriate PT Frequency: Min 4X/week Recommendations for Other Services: Rehab consult Follow Up Recommendations: Inpatient Rehab;Skilled nursing facility Equipment Recommended: Defer to next venue PT Goals  Acute Rehab PT Goals Pt will go Supine/Side to Sit: with modified independence PT Goal: Supine/Side to Sit - Progress: Progressing toward goal Pt will go Sit to Stand: with supervision PT Goal: Sit to Stand - Progress: Progressing toward goal Pt will go Stand to Sit: with supervision PT Goal: Stand to Sit - Progress: Progressing toward goal Pt will Transfer Bed to Chair/Chair to Bed: with supervision PT Transfer Goal: Bed to Chair/Chair to Bed - Progress: Progressing toward goal Pt will Ambulate: >150 feet;with supervision;with least restrictive assistive device PT Goal: Ambulate - Progress: Progressing toward goal  PT Treatment Precautions/Restrictions  Precautions Precautions: Fall Precaution Comments: Significant weakness Required Braces or Orthoses: No Restrictions Weight Bearing Restrictions: No Mobility (including Balance) Bed Mobility Bed Mobility: Yes Rolling Right: 5: Supervision Rolling Right Details (indicate cue type and reason): Pt utilizing Rt. UE much more during bed mobility today.  Right Sidelying to Sit: 5: Supervision;HOB flat Right Sidelying to Sit Details (indicate cue type and reason): Supervision for safety.  Sitting - Scoot to Edge of Bed: 5: Supervision Sitting - Scoot to Edge of Bed Details (indicate cue type and reason): Supervision for safety. Pt  utilizing Rt. UE to assist with transfer.  Transfers Transfers: Yes Sit to Stand: 1: +2 Total assist;Patient percentage (comment);With upper extremity assist;From bed (PT 80%., ) Sit to Stand Details (indicate cue type and reason): tactile facilitation for pelvic tilt, verbal cues for anterior weightshift and translation with standing. Pt appropriately placing UEs on bed. Stand to Sit: 3: Mod assist;With upper extremity assist;To chair/3-in-1;With armrests Stand to Sit Details: Pt with uncontrolled descend to chair. pt with hands remaining on RW with descend and max v/c to reach for arm rest. Re-educated on need to control descent and reach for armrests when sitting.  Ambulation/Gait Ambulation/Gait: Yes Ambulation/Gait Assistance: 4: Min assist Ambulation/Gait Assistance Details (indicate cue type and reason): Heavy min assist for increased sway - generalized instability. PT to facilitate Rt. hip rotation with Rt. heel strike. Verbal/visual cues for improved Rt. heel strike and improved dorsiflexion. Repeated verbal cues for upright posture. Pt with deteriorating mechanics with fatigue, Rt. hand began to slip off RW - PT had to ask pt to sit as he was determined to make it back to his room. Ambulation Distance (Feet): 94 Feet Assistive device: Rolling walker Gait Pattern: Step-to pattern;Decreased dorsiflexion - right;Trunk flexed;Trunk rotated posteriorly on right;Decreased stance time - right;Decreased weight shift to right Stairs: No  Posture/Postural Control Posture/Postural Control: Postural limitations Postural Limitations: Decreased ankle foot strategies, primarily relies on hip strategy Balance Balance Assessed: Yes Static Sitting Balance Static Sitting - Balance Support: Feet supported;No upper extremity supported Static Sitting - Level of Assistance: 5: Stand by assistance Static Standing Balance Static Standing - Balance Support: No upper extremity supported;Right upper extremity  supported;During functional activity Static Standing - Level of Assistance: Other (comment);Patient percentage (comment);3: Mod assist Static Standing - Comment/# of Minutes: up to moderate assist with no UE or with Rt. UE support only.  Dynamic Standing Balance Dynamic Standing - Level of Assistance:  (pt = 60%) Exercise  General Exercises - Lower Extremity Ankle Circles/Pumps: AROM;Both;20 reps;Seated Quad Sets: AROM;Both;10 reps;Seated (pt tending to flex Rt. knee instead of extend) Long Arc Quad: AROM;Left;Right;Seated;10 reps Heel Slides: Right;10 reps;Supine;AAROM (much less discomfort today) Hip Flexion/Marching: AROM;AAROM;Both;Other reps (comment);Seated (20 reps, pt progressing to AROM of Rt. hip. ) End of Session PT - End of Session Equipment Utilized During Treatment: Gait belt Activity Tolerance: Patient limited by fatigue Patient left: in chair;with call bell in reach Nurse Communication: Mobility status for transfers General Behavior During Session: [facility] for tasks performed Cognition: Impaired Cognitive Impairment: demonstrating improvements but with delayed processing and deficits with problem solving   Sherie Don 10/21/2011, 2:32 PM  Sherie Don) Carleene Mains PT, DPT Acute Rehabilitation [phone]

## 2011-10-21 NOTE — Discharge Summary (Signed)
Stroke Discharge Summary  Patient ID: Carl [name]   MRN: [id number]      DOB: [birth year]  Date of Admission: 10/08/2011 Date of Discharge: 10/21/2011  Attending Physician:  Darcella Cheshire, MD  Consulting Physician(s):     Claudette Laws, MD (PM&R), Billy Fischer, MD (PCCM), Erick Blinks, MD (GI),   Patient's PCP:  Tillman Abide, MD, MD  Discharge Diagnoses:  Active Problems: - * Left thalamic hemorrhage with intraventricular extension, including the fourth. Hemorrhage secondary to accelerated hypertension.  - Dysphagia, right hemiparesis and expressive aphasia secondary to hemorrhage. - Accelerated HTN- - metabolic encephalopathy, secondary to alcohol withdrawal - urinary tract infection, treated - Upper GI bleed secondary to gastric ulcers - irregular Z line per EGD - GERD - dyslipidemia - hypokalemia, resolved - chronic pain - right wrist and knee pain, improved on colchicine but normal uric acid levels and xray  -Obesity, class I, BMI: Body mass index is 29.32 kg/(m^2).   Past Medical History  Diagnosis Date  . Gout   . Hypertension   . Osteoarthritis     neck  . Impaired fasting glucose   . Alcoholism     still drinking wine as of Feb 2013  . Gastric ulcer 2011    egd at [facility] Aug 2011.  . Duodenal ulcer 2011   Past Surgical History  Procedure Date  . Cystogram 03/08   Medication List  As of 10/21/2011  2:05 PM   STOP taking these medications         acetaminophen 500 MG tablet      doxepin 10 MG capsule      traMADol 50 MG tablet         TAKE these medications         atenolol 100 MG tablet   Commonly known as: TENORMIN   Take 100 mg by mouth 2 (two) times daily.      cloNIDine 0.1 MG tablet   Commonly known as: CATAPRES   Take 0.1 mg by mouth 2 (two) times daily.      colchicine 0.6 MG tablet   Take 1 tablet (0.6 mg total) by mouth 2 (two) times daily.      colchicine 0.6 MG tablet   Take 1 tablet (0.6 mg total) by mouth  daily.      doxazosin 8 MG tablet   Commonly known as: CARDURA   Take 8 mg by mouth at bedtime.      hydrALAZINE 100 MG tablet   Commonly known as: APRESOLINE   Take 100 mg by mouth 3 (three) times daily.      losartan 50 MG tablet   Commonly known as: COZAAR   Take 50 mg by mouth daily.      pantoprazole 40 MG tablet   Commonly known as: PROTONIX   Take 1 tablet (40 mg total) by mouth daily at 12 noon.           LABORATORY STUDIES CBC    Component Value Date/Time   WBC 7.1 10/20/2011 1100   RBC 3.48* 10/20/2011 1100   HGB 11.3* 10/20/2011 1100   HCT 32.8* 10/20/2011 1100   PLT 286 10/20/2011 1100   MCV 94.3 10/20/2011 1100   MCH 32.5 10/20/2011 1100   MCHC 34.5 10/20/2011 1100   RDW 13.0 10/20/2011 1100   LYMPHSABS 0.5* 10/20/2011 1100   MONOABS 0.8 10/20/2011 1100   EOSABS 0.1 10/20/2011 1100   BASOSABS 0.0 10/20/2011 1100  CMP    Component Value Date/Time   NA 142 10/15/2011 1205   K 3.8 10/15/2011 1205   CL 106 10/15/2011 1205   CO2 24 10/15/2011 1205   GLUCOSE 127* 10/15/2011 1205   BUN 19 10/15/2011 1205   CREATININE 0.85 10/15/2011 1205   CALCIUM 9.5 10/15/2011 1205   PROT 7.6 10/13/2011 1510   ALBUMIN 3.7 10/13/2011 1510   AST 17 10/13/2011 1510   ALT 16 10/13/2011 1510   ALKPHOS 39 10/13/2011 1510   BILITOT 1.1 10/13/2011 1510   GFRNONAA 89* 10/15/2011 1205   GFRAA >90 10/15/2011 1205   COAGS Lab Results  Component Value Date   INR 0.98 10/08/2011   Lipid Panel    Component Value Date/Time   CHOL 166 10/12/2011 2331   TRIG 78 10/12/2011 2331   HDL 62 10/12/2011 2331   CHOLHDL 2.7 10/12/2011 2331   VLDL 16 10/12/2011 2331   LDLCALC 88 10/12/2011 2331   HgbA1C  Lab Results  Component Value Date   HGBA1C 5.3 10/12/2011   Urine Drug Screen     Component Value Date/Time   LABOPIA NONE DETECTED 10/08/2011 0245   COCAINSCRNUR NONE DETECTED 10/08/2011 0245   LABBENZ NONE DETECTED 10/08/2011 0245   AMPHETMU NONE DETECTED 10/08/2011 0245   THCU NONE DETECTED 10/08/2011 0245   LABBARB  NONE DETECTED 10/08/2011 0245    Alcohol Level No results found for this basename: eth   Uric Acid 4.2 Ammonia 17 TSH 0.348 Urinalysis nitrate positive, protein 100, cloudy Urine microscopic few squamous epithelial, 3-6 wbc, few bacteria, amorphous urates/phosphates Vitamin B 12 286 Magnesium 1.9 Phosphorus 2.6 MRSA screen negative Cardiac Enzymes negative  SIGNIFICANT DIAGNOSTIC STUDIES Dg Wrist Complete Right Dorsal soft tissue swelling without fracture or other bony abnormality  Dg Knee 1-2 Views Right Effusion without fracture or other acute bony abnormality.  CT of the brain  10/15/2011 Left thalamic hemorrhage  is unchanged.  Improved intraventricular hemorrhage.  No new findings 10/12/2011 Stable appearance of the left thalamic intraparenchymal hematoma.  10/11/2011 No significant interval change in the size of the left thalamic intraparenchymal hemorrhage, measuring 3.1 x 1.7 cm in transverse dimension. Redistributing intraventricular blood, small amount now subarachnoid on the left. No overt hydrocephalus.  10/09/2011 No change in the left thalamic hematoma with intraventricular penetration. Axial measurement of 30 x 18 mm.  10/08/2011 Stable appearance of large left thalamic hemorrhagic infarct with intra-articular extension of hemorrhage. No significant interval change.  10/08/2011 Left thalamic/basal ganglia intraparenchymal hemorrhage, with extension into the ventricular system.  MRI of the brain  MRA of the brain  2D Echocardiogram EF 55-60% with no source of embolus.  Carotid Doppler No internal carotid artery stenosis bilaterally. Vertebrals with antegrade flow bilaterally.  CXR  10/15/2011   Cardiomegaly again noted.  No pulmonary edema.  Stable basilar atelectasis.  10/12/2011 Cardiac enlargement with vascular congestion. Mild bibasilar atelectasis, slightly improved from the prior study.  10/10/2011   Unchanged findings of borderline enlarged cardiomediastinal silhouette and mild  pulmonary edema.  Further evaluation with PA and lateral chest radiograph may be obtained as clinically indicated.   10/08/2011 1. Low volumes with mild bilateral edema or infiltrates.  EKG sinus rhythm  EEG This routine EEG done with the patient awake  and drowsy is abnormal. Background activities across right hemisphere is slightly too slow suggesting a mild-to-moderate encephalopathy of nonspecific etiology. The additional slowing across the left hemisphere in the delta range with paucity of faster activity suggest an underlying structural or  functional lesion. The non-stereotyped both face, arm, and leg movements do not have an electrographic correlate. However, it is possible that a focal motor seizures involve 2 small part of the brain to be demonstrated on scalp EEG  ENDO 1) Irregular Z-line 2) Small hiatal hernia Esophagus, biopsy - SQUAMOCOLUMNAR MUCOSA WITH SQUAMOUS EPITHELIAL CHANGES CONSISTENT WITH REFLUX RELATED INJURY. - NEGATIVE FOR INTESTINAL METAPLASIA WITH GOBLET CELLS (BARRETT'S ESOPHAGUS). - NEGATIVE FOR MORPHOLOGICAL FEATURES OF EOSINOPHILIC ESOPHAGITIS. - REACTIVE AND MODERATELY INFLAMED GLANDULAR/COLUMNAR MUCOSA.   History of Present Illness  Carl [name] is an 66 y.o. male white presenting in the ER 10/08/2011 with sudden onset right hemiparesis and hematemesis. The patient is encephalopathic so most of the history is coming from his family. [name] tells me that the patient was found on the floor by his son 10/07/2011 earlier tonight around 10:45 pm. The patient had bowel incontinence and had vomited his dinner. [name] and wife tried to put the patient in the shower at which point he started vomiting blood. The family then called 911 and the patient was brought to the ER. The way to the ER and EMS vehicle the patient continued to have bloody vomit. His blood pressures were elevated in the ER and he was aphasic and able to are only a few words. He had right-sided  facial droop as well as the right arm and leg weakness. The patient was unable at that point to hold his arm up. During the course of history taking and examination the patient Improved in strength and speech.  according to the patient's wife and son comment the patient takes about 10-20 Tylenol that day for chronic neck pain. He also had bloody vomit last year and was found to have 2 ulcers. However comment that time he did not have any asymmetric weakness normal with the vomiting is excessive.  The patient is a recovering alcoholic and has been going to [employer] The wife is not sure, however, if he has stopped drinking alcohol altogether. CT of the brain done in the ED shows a left basal ganglia hemorrhage with slight midline shift. Patient was not a TPA candidate secondary to the hemorrhage. he was admitted to the neuro ICU for further evaluation and treatment.  Hospital Course Case was discussed with Dr. Gerlene Fee, neurosurgeon, who wished to be notified if the patient worsened. Patient was place on IV Cardene for BP control, 185/101 on admission. Once he was able to take pos, his home medications were resumed. Metoprolol was cut in half after he experienced bradycardia. BP continues to approach normal.   GI was consulted given his hx of GIB. Medical management was recommended. EGD done later in the hospitalization confirmed no bleeding with bx supporting GERD hx. Ongoing medical management was confirmed best treatment option.  Pt has history of ETOH use. He became agitated with increasing BP after his transfer to the floor. Pt was transferred back to ICU and place again on IV cardene. He was placed on the CIWA protocol with OTC ativan injections. Once the agitation turned to lethargy, the ativan was discontinued. Upon awakening, po meds were resumed with adequate BP control and pt was transferred back to the floor.  In hospital, pt developed right wrist and knee edema/pain. Imaging was negative for  abnormalities. Uric Acid was normal. Pt was treated with colchicine with improvement in symptoms. Will continue colchicine for 1 week. Consider allopurinol for prevention in the future.  Patient with continued stroke symptoms  of dysphagia requiring a dysphagia 3 nectar thick liquid diet. Dysphagia has been improving. MBSS done day of discharge allowed upgrade to thin liquids. Pt still with significant right hemiparesis. Physical therapy, occupational therapy and speech therapy evaluated patient. All agreed continued rehab is needed. Patient does not have family support for inpatient rehab, therefore, decision was made to pursue SNF placement. Once bed was found, patient was stable for discharge.  Discharge Exam  Blood pressure 146/91, pulse 77, temperature 97.8 F (36.6 C), temperature source Oral, resp. rate 20, height 5\' 10"  (1.778 m), weight 92.7 kg (204 lb 5.9 oz), SpO2 96.00%. Mental Status: Alert. Cooperative and attentive. Expressive aphasia, fractured speech pattern. Mild dysarthria. Hypophonic. Follows 2 step commands. More conversant this am. Initiates greeting. Clearly trying to express thoughts, gets frustrated when can't. 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  Cranial Nerves:  II-Visual fields grossly intact.  III/IV/VI-Extraocular movements intact without nystagmus.  V/VII-mild flattening right nasolabial fold.  VIII-grossly intact  IX/X-normal gag  XII-midline tongue extension  Motor: 4+/5 RUE, 5/5 LUE. 3/5 RLE, 5/5 LLE,Slight increased tone on the right. L grip fairly strong, R present, but weak.  Sensory: diminished sensory awareness R.  Deep Tendon Reflexes:1+ R U/LE, 2+ LU/LE  Cerebellar: poor coordination on the right.  * R wrist swollen , but decreased erythema and warmth today with diminished ROM and no tenderness to palpation, R knee swollen and decreased ROM, tender without erythema, increased warmth or effusion apparent.    Discharge Diet   General thin liquids  Discharge Plan   - Disposition: Discharge to skilled nursing facility for ongoing PT, OT and ST. - avoid antiplatelets and NSAIDS due to hemorrhage - colchicine x 1 week, consider allopurinol if needed - Ongoing risk factor control by Primary Care Physician. - Risk factor recommendations:  Hypertension target range 130-140/70-80 Lipid range - LDL < 100 and checked every 6 months, fasting  - Follow-up Tillman Abide, MD, MD in 1 month. - Follow-up with Dr. Delia Heady in 2 months.  Signed Annie Main, AVNP, ANP-BC, [facility] Nurse Practitioner 10/21/2011, 2:05 PM  Dr. Delia Heady, Stroke Center Medical Director, has personally reviewed chart, pertinent data, examined the patient and developed the plan of care.

## 2011-10-21 NOTE — Plan of Care (Signed)
Problem: Phase II Progression Outcomes Goal: Tolerating diet/TF at goal rate MBS completed.  Recommending upgrade to regular diet texture and thin liquids with swallow precautions (small sips, no straws).  See report for full details.  Breck Coons [place].Ed [employer] [phone]  10/21/2011

## 2011-11-25 ENCOUNTER — Emergency Department: Payer: Self-pay | Admitting: Emergency Medicine

## 2011-11-25 LAB — COMPREHENSIVE METABOLIC PANEL
BUN: 13 mg/dL (ref 7–18)
Bilirubin,Total: 0.7 mg/dL (ref 0.2–1.0)
Creatinine: 0.97 mg/dL (ref 0.60–1.30)
EGFR (Non-African Amer.): >60
Glucose: 105 mg/dL — ABNORMAL HIGH (ref 65–99)
SGOT(AST): 16 U/L (ref 15–37)
SGPT (ALT): 14 U/L
Sodium: 141 mmol/L (ref 136–145)
Total Protein: 6.7 g/dL (ref 6.4–8.2)

## 2011-11-25 LAB — CBC
MCH: 30.2 pg (ref 26.0–34.0)
MCHC: 33.3 g/dL (ref 32.0–36.0)
MCV: 91 fL (ref 80–100)
RBC: 3.49 10*6/uL — ABNORMAL LOW (ref 4.40–5.90)
RDW: 12.5 % (ref 11.5–14.5)

## 2011-11-25 LAB — PROTIME-INR: Prothrombin Time: 14.7 secs (ref 11.5–14.7)

## 2011-12-13 ENCOUNTER — Encounter: Payer: Self-pay | Admitting: Family Medicine

## 2012-01-01 ENCOUNTER — Encounter: Payer: Self-pay | Admitting: Family Medicine

## 2012-02-01 ENCOUNTER — Encounter: Payer: Self-pay | Admitting: Family Medicine

## 2012-03-02 ENCOUNTER — Encounter: Payer: Self-pay | Admitting: Family Medicine

## 2012-04-02 ENCOUNTER — Encounter: Payer: Self-pay | Admitting: Family Medicine

## 2012-05-03 ENCOUNTER — Encounter: Payer: Self-pay | Admitting: Family Medicine

## 2012-08-21 DIAGNOSIS — H269 Unspecified cataract: Secondary | ICD-10-CM | POA: Insufficient documentation

## 2013-01-11 IMAGING — CT CT HEAD W/O CM
1 of 4 series · 10 of 30 positions shown, 13 images · non-contrast
Comparison: 10/12/2011

CLINICAL DATA: Altered mental status.  Follow up intracranial
hemorrhage

CT HEAD WITHOUT CONTRAST
TECHNIQUE: Contiguous axial images were obtained from the base of
the skull through the vertex without contrast.

[Series 3: recon 2: brain · axial · 0.47mm/px · z∈[+131,+247]mm · 10 of 56 slices shown, 13 images]
[im 6/56  brain]
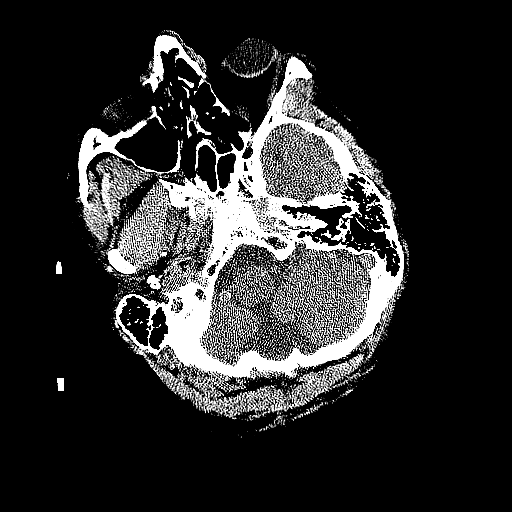
[im 6/56  bone]
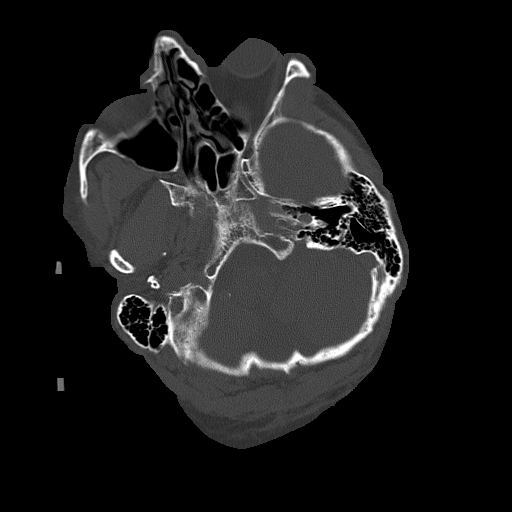
[im 11/56  brain]
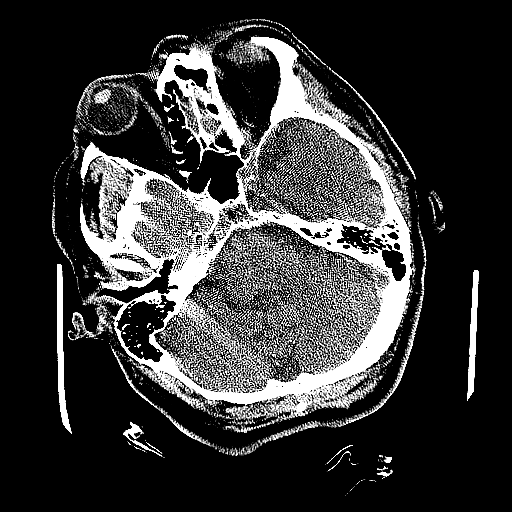
[im 16/56  brain]
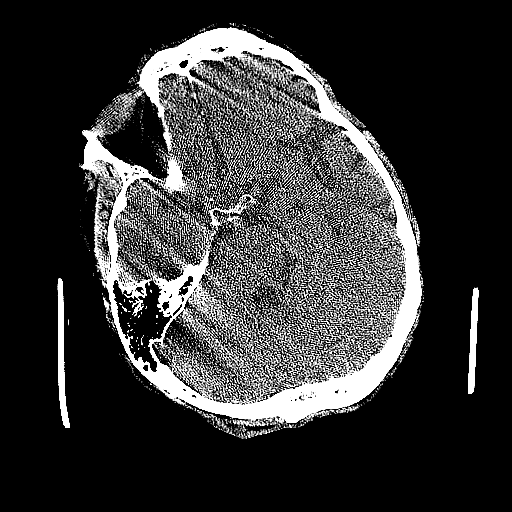
[im 21/56  brain]
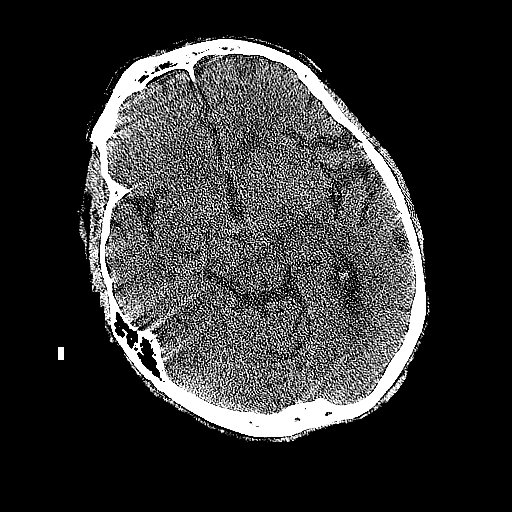
[im 26/56  brain]
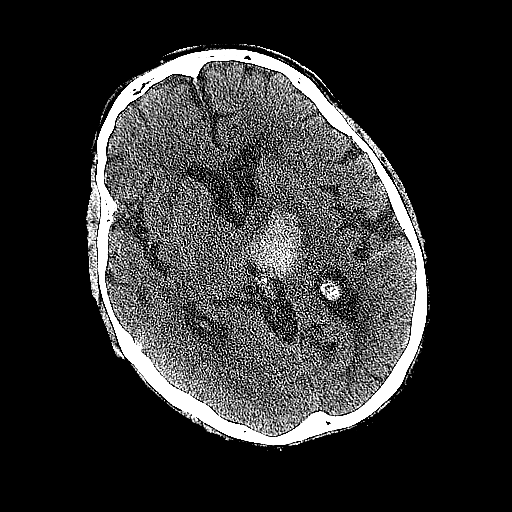
[im 26/56  bone]
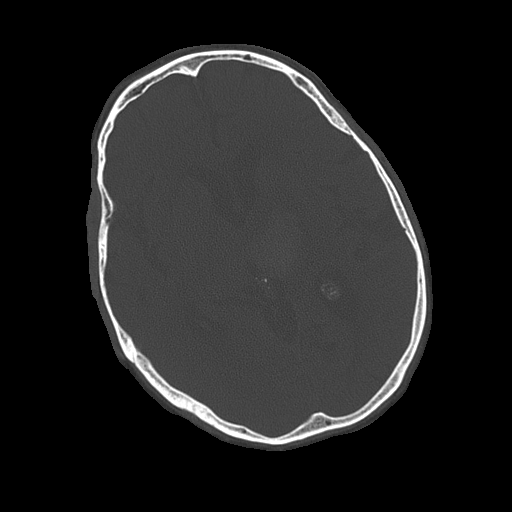
[im 31/56  brain]
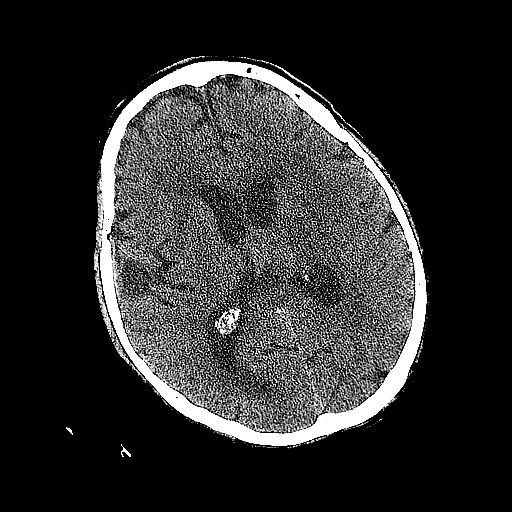
[im 36/56  brain]
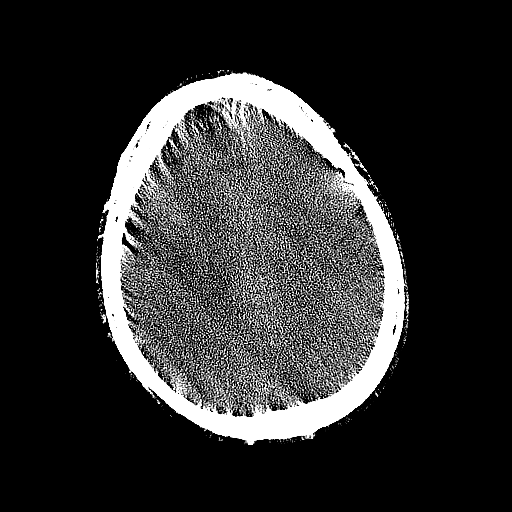
[im 41/56  brain]
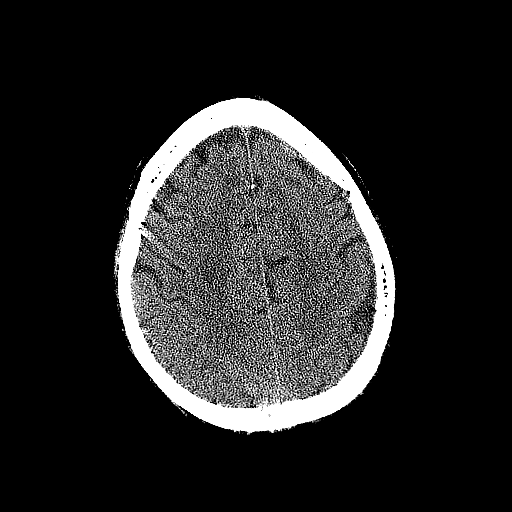
[im 46/56  brain]
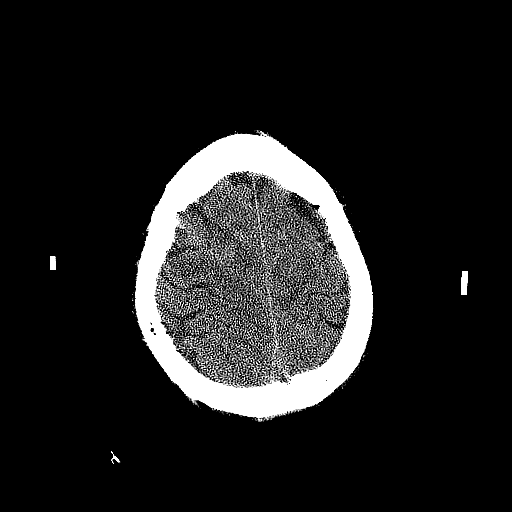
[im 46/56  bone]
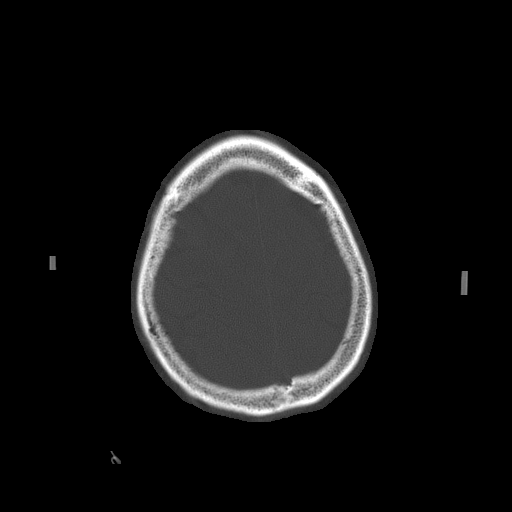
[im 51/56  brain]
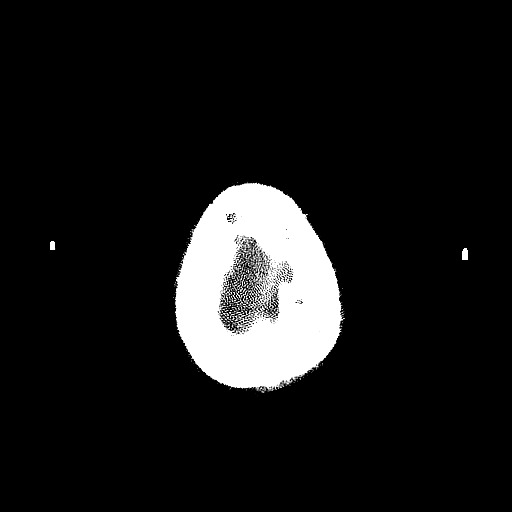

[10 of 30 positions shown; findings below may reference images not displayed]

FINDINGS: Image quality degraded by motion.

Left thalamic hemorrhage is unchanged.  High density hemorrhage
measures 29 x 27 mm with surrounding low density.  Minimal
intraventricular hemorrhage is improved.  No hydrocephalus.  No
acute ischemic infarct.  No mass lesion
IMPRESSION: Left thalamic hemorrhage  is unchanged.  Improved intraventricular
hemorrhage.  No new findings.

## 2013-02-19 DIAGNOSIS — I340 Nonrheumatic mitral (valve) insufficiency: Secondary | ICD-10-CM | POA: Insufficient documentation

## 2013-02-19 DIAGNOSIS — I517 Cardiomegaly: Secondary | ICD-10-CM | POA: Insufficient documentation

## 2013-02-19 DIAGNOSIS — E7800 Pure hypercholesterolemia, unspecified: Secondary | ICD-10-CM | POA: Insufficient documentation

## 2013-02-19 DIAGNOSIS — I071 Rheumatic tricuspid insufficiency: Secondary | ICD-10-CM | POA: Insufficient documentation

## 2013-04-22 DIAGNOSIS — R351 Nocturia: Secondary | ICD-10-CM | POA: Insufficient documentation

## 2013-05-05 DIAGNOSIS — M47812 Spondylosis without myelopathy or radiculopathy, cervical region: Secondary | ICD-10-CM | POA: Insufficient documentation

## 2013-05-05 DIAGNOSIS — F329 Major depressive disorder, single episode, unspecified: Secondary | ICD-10-CM | POA: Insufficient documentation

## 2013-06-23 DIAGNOSIS — F321 Major depressive disorder, single episode, moderate: Secondary | ICD-10-CM | POA: Insufficient documentation

## 2013-06-23 DIAGNOSIS — F09 Unspecified mental disorder due to known physiological condition: Secondary | ICD-10-CM | POA: Insufficient documentation

## 2013-06-23 DIAGNOSIS — G3184 Mild cognitive impairment, so stated: Secondary | ICD-10-CM | POA: Insufficient documentation

## 2013-07-23 ENCOUNTER — Ambulatory Visit: Payer: Self-pay | Admitting: Unknown Physician Specialty

## 2013-09-09 DIAGNOSIS — G894 Chronic pain syndrome: Secondary | ICD-10-CM | POA: Insufficient documentation

## 2013-09-09 DIAGNOSIS — IMO0002 Reserved for concepts with insufficient information to code with codable children: Secondary | ICD-10-CM | POA: Insufficient documentation

## 2013-10-07 ENCOUNTER — Ambulatory Visit: Payer: Self-pay | Admitting: Unknown Physician Specialty

## 2013-10-08 LAB — PATHOLOGY REPORT

## 2013-10-21 DIAGNOSIS — G43719 Chronic migraine without aura, intractable, without status migrainosus: Secondary | ICD-10-CM | POA: Insufficient documentation

## 2015-02-09 DIAGNOSIS — M542 Cervicalgia: Secondary | ICD-10-CM | POA: Insufficient documentation

## 2015-05-16 DIAGNOSIS — M47812 Spondylosis without myelopathy or radiculopathy, cervical region: Secondary | ICD-10-CM | POA: Insufficient documentation

## 2016-12-06 ENCOUNTER — Emergency Department: Payer: Medicare Other

## 2016-12-06 ENCOUNTER — Emergency Department
Admission: EM | Admit: 2016-12-06 | Discharge: 2016-12-06 | Disposition: A | Discharge status: Home / Self Care | Payer: Medicare Other | Attending: Emergency Medicine | Admitting: Emergency Medicine

## 2016-12-06 DIAGNOSIS — F4325 Adjustment disorder with mixed disturbance of emotions and conduct: Secondary | ICD-10-CM | POA: Diagnosis not present

## 2016-12-06 DIAGNOSIS — Z79899 Other long term (current) drug therapy: Secondary | ICD-10-CM | POA: Insufficient documentation

## 2016-12-06 DIAGNOSIS — I10 Essential (primary) hypertension: Secondary | ICD-10-CM | POA: Diagnosis not present

## 2016-12-06 DIAGNOSIS — R51 Headache: Secondary | ICD-10-CM | POA: Diagnosis not present

## 2016-12-06 DIAGNOSIS — Z87891 Personal history of nicotine dependence: Secondary | ICD-10-CM | POA: Insufficient documentation

## 2016-12-06 DIAGNOSIS — G890 Central pain syndrome: Secondary | ICD-10-CM

## 2016-12-06 DIAGNOSIS — R519 Headache, unspecified: Secondary | ICD-10-CM

## 2016-12-06 HISTORY — DX: Cervicalgia: M54.2

## 2016-12-06 HISTORY — DX: Anxiety disorder, unspecified: F41.9

## 2016-12-06 HISTORY — DX: Depression, unspecified: F32.A

## 2016-12-06 HISTORY — DX: Endothelial corneal dystrophy, unspecified eye: H18.519

## 2016-12-06 HISTORY — DX: Nonrheumatic mitral (valve) insufficiency: I34.0

## 2016-12-06 HISTORY — DX: Radiculopathy, site unspecified: M54.10

## 2016-12-06 HISTORY — DX: Neuralgia and neuritis, unspecified: M79.2

## 2016-12-06 HISTORY — DX: Endothelial corneal dystrophy: H18.51

## 2016-12-06 HISTORY — DX: Chronic migraine without aura, intractable, without status migrainosus: G43.719

## 2016-12-06 HISTORY — DX: Occipital neuralgia: M54.81

## 2016-12-06 HISTORY — DX: Major depressive disorder, single episode, unspecified: F32.9

## 2016-12-06 HISTORY — DX: Chronic pain syndrome: G89.4

## 2016-12-06 HISTORY — DX: Gastrointestinal hemorrhage, unspecified: K92.2

## 2016-12-06 HISTORY — DX: Unspecified cataract: H26.9

## 2016-12-06 HISTORY — DX: Pure hypercholesterolemia, unspecified: E78.00

## 2016-12-06 HISTORY — DX: Other cervical disc degeneration, unspecified cervical region: M50.30

## 2016-12-06 HISTORY — DX: Spondylosis without myelopathy or radiculopathy, cervical region: M47.812

## 2016-12-06 HISTORY — DX: Cardiomegaly: I51.7

## 2016-12-06 HISTORY — DX: Rheumatic tricuspid insufficiency: I07.1

## 2016-12-06 HISTORY — DX: Cerebral infarction, unspecified: I63.9

## 2016-12-06 HISTORY — DX: Nocturia: R35.1

## 2016-12-06 MED ORDER — HYDRALAZINE HCL 50 MG PO TABS
100.0000 mg | ORAL_TABLET | Freq: Once | ORAL | Status: AC
  Administered 2016-12-06: 100 mg via ORAL
  Filled 2016-12-06: qty 2

## 2016-12-06 MED ORDER — GABAPENTIN 100 MG PO CAPS: 100.0000 mg | ORAL_CAPSULE | Freq: Three times a day (TID) | ORAL | 0 refills | Status: DC

## 2016-12-06 MED ORDER — DULOXETINE HCL 30 MG PO CPEP: 30.0000 mg | ORAL_CAPSULE | Freq: Every day | ORAL | 0 refills | Status: DC

## 2016-12-06 MED ORDER — OXYCODONE HCL 5 MG PO TABS
5.0000 mg | ORAL_TABLET | ORAL | Status: AC
  Administered 2016-12-06: 5 mg via ORAL
  Filled 2016-12-06: qty 1

## 2016-12-06 NOTE — ED Triage Notes (Addendum)
Pt reports to ED w/ c/o h/a x 4 years.  Pt sts that h/a gradually getting worse, unable to be relieved by tylenol/ibuprofen.  Denies changes in vision, photophobia. Resp even and unlabored. NAD.  Pt sts pain worse "last couple of days", denies injury

## 2016-12-06 NOTE — Consult Note (Signed)
[facility] Face-to-Face Psychiatry Consult   Reason for Consult:  Consult for this 72 year old man with chronic pain who came to the emergency room with a worsening of his neck and head pain Referring Physician:  Quale Patient Identification: Carl [name] MRN:  [id number] Principal Diagnosis: Adjustment disorder with mixed disturbance of emotions and conduct Diagnosis:   Patient Active Problem List   Diagnosis Date Noted  . 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 Adjustment disorder with mixed disturbance of emotions and conduct [F43.25] 12/06/2016  . ANXIETY, SITUATIONAL [F43.8] 06/15/2010  . OSTEOARTHRITIS [M19.90] 06/15/2010  . CELLULITIS, [place], LEFT [L03.119, L02.519] 05/03/2010  . CONTACT DERMATITIS&OTHER ECZEMA DUE TO PLANTS [L25.5] 01/12/2010  . NECK SPRAIN AND STRAIN [S13.9XXA] 01/03/2010  . LUNG NODULE [J98.4] 07/17/2009  . CHEST PAIN [R07.9] 07/13/2009  . UNSPECIFIED PROSTATITIS [N41.9] 06/15/2009  . ALCOHOL ABUSE [F10.10] 05/15/2009  . ALCOHOL WITHDRAWAL [F10.239] 04/24/2009  . IMPAIRED FASTING GLUCOSE [R73.01] 11/04/2007  . GOUT [M10.9] 01/02/2007  . HYPERTENSION [I10] 01/02/2007    Total Time spent with patient: 1 hour  Subjective:   Carl [name] is a 71 y.o. male patient admitted with "I really hurt".  HPI:  This is a consult for 71 year old man came to the emergency room because of worsening of his head and neck pain. He reports that he has had this pain since he had a stroke in 2013. It has waxed and waned over the years but for the last couple weeks has been worse than average. Patient describes the pain as being localized at the base of his neck but also over the whole left side of his scalp and down into his neck. This pain apparently happened or at least as far as he remembers soon after he had his strokes. Since then he has had multiple lines of treatment including medications, injections into his spine, Botox injections. He is claiming now that none of it was of any  help at all. Currently he is not taking any pain medicine except some aspirin and Tylenol. Patient had made a comment to the emergency room physician that if he could not get any improvement in his pain he might as well kill himself. This triggered a consult with me. Patient says he has had this thought but has never actually seriously considered killing himself. He does not actually want to die. His mood is bad but is focused all around the pain. He has poor sleep at night but again this is all focused around his pain. Denies any hallucinations.  Medical history: Hypertension. History of several strokes at least one of which was described as being in his thalamus.  Social history: Patient lives with his wife has to adult sons who are there at least part time. We didn't get into much detail on this but it sounds like at least one of the sons is a source of stress to the family.  Substance abuse history: Patient used to have a history of having a drinking problem but says he quit 4 years ago and does not drink. There is no evidence that he has been abusing narcotics or other drugs since then.  Past Psychiatric History: No history of psychiatric hospitalization. He has been worse deferred to see psychiatrist as part of his pain regimen but has not thought that it was particularly helpful. He says for years he was prescribed Cymbalta. He claims now to think that it was never of any help at all. Rate of suicide attempts or violence or  psychosis  Risk to Self: Is patient at risk for suicide?: No Risk to Others:   Prior Inpatient Therapy:   Prior Outpatient Therapy:    Past Medical History:  Past Medical History:  Diagnosis Date  . Alcoholism ([place])    still drinking wine as of Feb 2013  . Anxiety and depression   . Cataract   . Cervical spondylosis without myelopathy   . Cervicalgia of occipito-atlanto-axial region   . Chronic pain disorder   . Degeneration of cervical intervertebral disc   .  Duodenal ulcer 2011  . Facet arthropathy, cervical   . Fuchs' corneal dystrophy   . Gastric ulcer 2011   egd at [place] Aug 2011.  . GI bleed   . Gout   . Hypertension   . Impaired fasting glucose   . 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 LVH (left ventricular hypertrophy)   . Mitral insufficiency   . Neck pain   . Neuralgia and neuritis   . Nocturia   . Occipital neuralgia   . Osteoarthritis    neck  . Osteoarthritis   . Pure hypercholesterolemia   . Radiculitis   . Stroke ([place])   . Tricuspid insufficiency     Past Surgical History:  Procedure Laterality Date  . CYSTOGRAM  03/08   Family History:  Family History  Problem Relation Age of Onset  . Fibromyalgia Mother   . Diabetes Neg Hx    Family Psychiatric  History: History of anxiety in 1 or 2 family members no history of suicide Social History:  History  Alcohol Use  . Yes     History  Drug use: Unknown    Social History   Social History  . Marital status: Married    Spouse name: N/A  . Number of children: 2  . Years of education: N/A   Occupational History  . retired    Social History Main Topics  . Smoking status: Former Smoker    Quit date: 09/02/1978  . Smokeless tobacco: Never Used  . Alcohol use Yes  . Drug use: Unknown  . Sexual activity: Not Asked   Other Topics Concern  . None   Social History Narrative   Enjoys Diplomatic Services operational officer Social History:    Allergies:   Allergies  Allergen Reactions  . Ace Inhibitors Other (See Comments)    unknown  . Hydrochlorothiazide Other (See Comments)    unknown  . Lisinopril Other (See Comments)    unknown  . Nsaids Other (See Comments)    Gi bleed  . Ramipril Other (See Comments)    unknown    Labs: No results found for this or any previous visit (from the past 48 hour(s)).  No current facility-administered medications for this encounter.    Current Outpatient Prescriptions    Medication Sig Dispense Refill  . atorvastatin (LIPITOR) 10 MG tablet Take 10 mg by mouth every evening.    . carvedilol (COREG) 12.5 MG tablet Take 12.5 mg by mouth 2 (two) times daily.   0  . cloNIDine (CATAPRES) 0.1 MG tablet Take 0.1 mg by mouth 2 (two) times daily.    Marland Kitchen doxazosin (CARDURA) 8 MG tablet Take 8 mg by mouth at bedtime.      . hydrALAZINE (APRESOLINE) 100 MG tablet Take 100 mg by mouth 3 (three) times daily.    Marland Kitchen losartan (COZAAR) 100 MG tablet Take 100 mg by mouth daily.     Marland Kitchen  colchicine 0.6 MG tablet Take 1 tablet (0.6 mg total) by mouth daily. (Patient not taking: Reported on 12/06/2016) 30 tablet 1  . DULoxetine (CYMBALTA) 30 MG capsule Take 1 capsule (30 mg total) by mouth daily. 30 capsule 0  . gabapentin (NEURONTIN) 100 MG capsule Take 1 capsule (100 mg total) by mouth 3 (three) times daily. 90 capsule 0  . pantoprazole (PROTONIX) 40 MG tablet Take 1 tablet (40 mg total) by mouth daily at 12 noon. (Patient not taking: Reported on 12/06/2016) 30 tablet 2    Musculoskeletal: Strength & Muscle Tone: decreased Gait & Station: unsteady Patient leans: Right  Psychiatric Specialty Exam: Physical Exam  Nursing note and vitals reviewed. Constitutional: He appears well-developed and well-nourished.  HENT:  Head: Normocephalic and atraumatic.  Eyes: Conjunctivae are normal. Pupils are equal, round, and reactive to light.  Neck: Normal range of motion.    Cardiovascular: Regular rhythm and normal heart sounds.   Respiratory: Effort normal.  GI: Soft.  Musculoskeletal: Normal range of motion.  Neurological: He is alert.  Skin: Skin is warm and dry.  Psychiatric: His mood appears anxious. His speech is delayed. He is slowed and withdrawn. Cognition and memory are impaired. He expresses impulsivity. He expresses suicidal ideation. He expresses no suicidal plans.    Review of Systems  Constitutional: Negative.   HENT: Negative.   Eyes: Negative.   Respiratory: Negative.    Cardiovascular: Negative.   Gastrointestinal: Negative.   Musculoskeletal: Positive for back pain and neck pain.  Skin: Negative.   Neurological: Positive for headaches.  Psychiatric/Behavioral: Positive for depression and suicidal ideas. Negative for hallucinations, memory loss and substance abuse. The patient is nervous/anxious and has insomnia.     Blood pressure (!) 165/113, pulse (!) 102, temperature 97.7 F (36.5 C), temperature source Oral, resp. rate 20, height 5\' 6"  (1.676 m), weight 88.9 kg (196 lb), SpO2 97 %.Body mass index is 31.64 kg/m.  General Appearance: Casual  Eye Contact:  Fair  Speech:  Slow  Volume:  Decreased  Mood:  Dysphoric and Irritable  Affect:  Depressed  Thought Process:  Disorganized  Orientation:  Full (Time, Place, and Person)  Thought Content:  Rumination and Tangential  Suicidal Thoughts:  Yes.  without intent/plan  Homicidal Thoughts:  No  Memory:  Immediate;   Fair Recent;   Fair Remote;   Fair  Judgement:  Fair  Insight:  Fair  Psychomotor Activity:  Restlessness  Concentration:  Concentration: Fair  Recall:  [employer] of Knowledge:  Fair  Language:  Fair  Akathisia:  No  Handed:  Right  AIMS (if indicated):     Assets:  Desire for Improvement Financial Resources/Insurance Housing Resilience Social Support  ADL's:  Intact  Cognition:  Impaired,  Mild  Sleep:        Treatment Plan Summary: Medication management and Plan 71 year old man who presents with what sounds to me most 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 Could have some musculoskeletal element as well although it sounds like that has been addressed without much benefit. I explained this to the patient and also explained that his goal should be even slight and partial improvement for now with such a chronic pain. I propose that we restart Cymbalta 30 mg a day and also start gabapentin 100 mg 3 times a day. I suspect that with his  catastrophic thinking right now he is exaggerating the hopelessness he has had and that probably there  was some benefit from this type of medication in the past. Patient is agreeable to the plan. As far as suicidality I agree that this is a concern particularly as the patient says that he does have guns at home. Patient however emphasizes that he has never tried to kill himself and actually does not want to die. Patient does not appear to have a major depression. I would not recommend commitment and I don't think it would be beneficial to force him to stay in a psychiatric environment. I do suggest that perhaps he not go home until his wife comes to pick him up this evening. I think the most helpful thing for his condition long-term would be to get back into seeing a neurologist. He has seen Dr. Manuella Ghazi before and that would certainly be one other option. I can also suggests the name of Dr. Michel Santee [phone] who is a neurologist with a head and neck pain specialty in [place].  Disposition: Patient does not meet criteria for psychiatric inpatient admission. Supportive therapy provided about ongoing stressors. Discussed crisis plan, support from social network, calling 911, coming to the Emergency Department, and calling Suicide Hotline.  Alethia Berthold, MD 12/06/2016 6:23 PM

## 2016-12-06 NOTE — Discharge Instructions (Signed)
Please follow up closely with neurology. Please start medications as prescribed by Dr. Weber Cooks.  Return to the ED and call "[phone]" if you have a worsening headache, thoughts of harming yourself, sudden and severe headache, confusion, slurred speech, facial droop, weakness or numbness in any arm or leg, extreme fatigue, vision problems, or other symptoms that concern you.  No driving tonight as you were given oxycodone in the Er which can affect your ability to drive safely.

## 2016-12-06 NOTE — ED Provider Notes (Signed)
[facility] Emergency Department Provider Note  ____________________________________________   First MD Initiated Contact with Patient 12/06/16 1355     (approximate)  I have reviewed the triage vital signs and the nursing notes.   HISTORY  Chief Complaint Headache  HPI Carl [name] is a 71 y.o. male history of cervicalgia hypertension, neuralgia neuritis, previous stroke.  Patient reports he is here seeking relief of his chronic that slowly worsening left neck pain. Said this pain for over 5 years now, he describes to me having seen multiple neurosurgeons, neurologists, primary doctors attempting multiple things including medications, Botox, some other type of injections most of which have been done through [facility].  Patient reports that this pain just steadily worsens, is never getting better. He is here requesting if I might have some other ideas on how to provide him relief of his neck pain. We discussed medications, offered several to him that perhaps he may not have tried including Neurontin, Lyrica, amitriptyline, hydrocodone, but he reports that he is tried all of these and nothing provides relief.  His blood pressure is also elevated, but he also reports he has not had his second dose of hydralazine at today.  No chest pain or trouble breathing. No numbness or weakness to no trouble walking. No changes in speech.  Past Medical History:  Diagnosis Date  . Alcoholism ([place])    still drinking wine as of Feb 2013  . Anxiety and depression   . Cataract   . Cervical spondylosis without myelopathy   . Cervicalgia of occipito-atlanto-axial region   . Chronic pain disorder   . Degeneration of cervical intervertebral disc   . Duodenal ulcer 2011  . Facet arthropathy, cervical   . Fuchs' corneal dystrophy   . Gastric ulcer 2011   egd at [place] Aug 2011.  . GI bleed   . Gout   . Hypertension   . Impaired fasting glucose   .  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 LVH (left ventricular hypertrophy)   . Mitral insufficiency   . Neck pain   . Neuralgia and neuritis   . Nocturia   . Occipital neuralgia   . Osteoarthritis    neck  . Osteoarthritis   . Pure hypercholesterolemia   . Radiculitis   . Stroke ([place])   . Tricuspid insufficiency     Patient Active Problem List   Diagnosis Date Noted  . 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 ANXIETY, SITUATIONAL 06/15/2010  . OSTEOARTHRITIS 06/15/2010  . CELLULITIS, HAND, LEFT 05/03/2010  . CONTACT DERMATITIS&OTHER ECZEMA DUE TO PLANTS 01/12/2010  . NECK SPRAIN AND STRAIN 01/03/2010  . LUNG NODULE 07/17/2009  . CHEST PAIN 07/13/2009  . UNSPECIFIED PROSTATITIS 06/15/2009  . ALCOHOL ABUSE 05/15/2009  . ALCOHOL WITHDRAWAL 04/24/2009  . IMPAIRED FASTING GLUCOSE 11/04/2007  . GOUT 01/02/2007  . HYPERTENSION 01/02/2007    Past Surgical History:  Procedure Laterality Date  . CYSTOGRAM  03/08    Prior to Admission medications   Medication Sig Start Date End Date Taking? Authorizing Provider  atorvastatin (LIPITOR) 10 MG tablet Take 10 mg by mouth every evening. 11/06/16 04/27/17 Yes Historical Provider, MD  carvedilol (COREG) 12.5 MG tablet Take 12.5 mg by mouth 2 (two) times daily.  11/06/16  Yes Historical Provider, MD  cloNIDine (CATAPRES) 0.1 MG tablet Take 0.1 mg by mouth 2 (two) times daily.  Yes Historical Provider, MD  doxazosin (CARDURA) 8 MG tablet Take 8 mg by mouth at bedtime.     Yes Historical Provider, MD  hydrALAZINE (APRESOLINE) 100 MG tablet Take 100 mg by mouth 3 (three) times daily.   Yes Historical Provider, MD  losartan (COZAAR) 100 MG tablet Take 100 mg by mouth daily.    Yes Historical Provider, MD  colchicine 0.6 MG tablet Take 1 tablet (0.6 mg total) by mouth daily. Patient not taking: Reported on 12/06/2016 10/21/11 10/20/12   Donzetta Starch, NP  DULoxetine (CYMBALTA) 30 MG capsule Take 1 capsule (30 mg total) by mouth daily. 12/06/16   Gonzella Lex, MD  gabapentin (NEURONTIN) 100 MG capsule Take 1 capsule (100 mg total) by mouth 3 (three) times daily. 12/06/16   Gonzella Lex, MD  pantoprazole (PROTONIX) 40 MG tablet Take 1 tablet (40 mg total) by mouth daily at 12 noon. Patient not taking: Reported on 12/06/2016 10/21/11 10/20/12  Donzetta Starch, NP    Allergies Ace inhibitors; Hydrochlorothiazide; Lisinopril; Nsaids; and Ramipril  Family History  Problem Relation Age of Onset  . Fibromyalgia Mother   . Diabetes Neg Hx     Social History Social History  Substance Use Topics  . Smoking status: Former Smoker    Quit date: 09/02/1978  . Smokeless tobacco: Never Used  . Alcohol use Yes    Review of Systems Constitutional: No fever/chills Eyes: No visual changes. ENT: No sore throat.See history of present illness regarding neck pain Cardiovascular: Denies chest pain. Respiratory: Denies shortness of breath. Musculoskeletal: Negative for back pain. Skin: Negative for rash. Neurological: Negative for trouble speaking, trouble walking, focal weakness or numbness.  10-point ROS otherwise negative.  ____________________________________________   PHYSICAL EXAM:  VITAL SIGNS: ED Triage Vitals  Enc Vitals Group     BP 12/06/16 1423 (!) 172/130     Pulse Rate 12/06/16 1205 88     Resp 12/06/16 1205 18     Temp 12/06/16 1205 97.7 F (36.5 C)     Temp Source 12/06/16 1205 Oral     SpO2 12/06/16 1205 97 %     Weight 12/06/16 1205 196 lb (88.9 kg)     Height 12/06/16 1205 5\' 6"  (1.676 m)     Head Circumference --      Peak Flow --      Pain Score 12/06/16 1202 8     Pain Loc --      Pain Edu? --      Excl. in [place]? --     Constitutional: Alert and oriented. Well appearing and in no acute distress though he is holding the left side of his neck with his hand and appears at least in moderate. Eyes:  Conjunctivae are normal. PERRL. EOMI. Head: Atraumatic. Nose: No congestion/rhinnorhea. Mouth/Throat: Mucous membranes are moist.  Oropharynx non-erythematous. Neck: No stridor.  No midline cervical tenderness. Does report tenderness to palpation over the left lateral portion of the upper neck/occiput. There is no rash or overlying lesion. Cardiovascular: Normal rate, regular rhythm. Grossly normal heart sounds.  Good peripheral circulation. Respiratory: Normal respiratory effort.  No retractions. Lungs CTAB. Musculoskeletal: No lower extremity tenderness nor edema.  No joint effusions. Neurologic:  Normal speech and language. No gross focal neurologic deficits are appreciated. No gait instability.  The patient has no pronator drift. The patient has normal cranial nerve exam. Extraocular movements are normal. Visual fields are normal. Patient has 5 out of 5 strength in all extremities.  There is no numbness or gross, acute sensory abnormality in the extremities bilaterally. No speech disturbance. No dysarthria. No aphasia. No ataxia. Normal finger nose finger bilat. Patient speaking in full and clear sentences.  Skin:  Skin is warm, dry and intact. No rash noted. Psychiatric: Mood and affect are normal. Speech and behavior are normal.  ____________________________________________   LABS (all labs ordered are listed, but only abnormal results are displayed)  Labs Reviewed - No data to display ____________________________________________  EKG  Patient denies any chest complaint. ____________________________________________  RADIOLOGY  Ct Head Wo Contrast  Result Date: 12/06/2016 CLINICAL DATA:  Headache for 4 years, gradually worsening. Left neck pain. Occipital pain. EXAM: CT HEAD WITHOUT CONTRAST CT CERVICAL SPINE WITHOUT CONTRAST TECHNIQUE: Multidetector CT imaging of the head and cervical spine was performed following the standard protocol without intravenous contrast.  Multiplanar CT image reconstructions of the cervical spine were also generated. COMPARISON:  10/15/2011 head CT FINDINGS: CT HEAD FINDINGS Brain: No evidence of acute infarction, hemorrhage, hydrocephalus, extra-axial collection or mass lesion/mass effect. Chronic microvascular disease with mild for age ischemic gliosis in the cerebral white matter. Cavity in the left thalamus which was related to hemorrhage in 2013. There is more lateral and inferior lacune. Normal brain volume. Vascular: Atherosclerotic calcification. No hyperdense vessel suspected. Skull: No acute finding Sinuses/Orbits: Negative globes. CT CERVICAL SPINE FINDINGS Alignment: Straightening with mild anterolisthesis at C3-4, facet mediated. Skull base and vertebrae: No acute fracture. No primary bone lesion or focal pathologic process. Soft tissues and spinal canal: No evidence of inflammation or mass. No evidence of epidural hematoma. 15 mm left thyroid nodule. Disc levels: Degenerative disc narrowing that is advanced from C3-4 to C6-7, with spurring. Uncovertebral spurs narrow the bilateral foramina, up to moderate at C3-4. No evidence of cord impingement. Facet arthropathy with spurring greatest at C3-4. Upper chest: No acute finding IMPRESSION: 1. No acute intracranial or cervical spine finding. 2. 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 3. Advanced cervical disc degeneration from C3-4 to C6-7. Uncovertebral spurs cause up to moderate foraminal narrowing on the left at C3-4. 4. 15 mm left thyroid nodule, at the size threshold where outpatient follow-up sonography is recommended. Electronically Signed   By: Monte Fantasia M.D.   On: 12/06/2016 14:57   Ct Cervical Spine Wo Contrast  Result Date: 12/06/2016 CLINICAL DATA:  Headache for 4 years, gradually worsening. Left neck pain. Occipital pain. EXAM: CT HEAD WITHOUT CONTRAST CT CERVICAL SPINE WITHOUT CONTRAST TECHNIQUE: Multidetector CT imaging of the head  and cervical spine was performed following the standard protocol without intravenous contrast. Multiplanar CT image reconstructions of the cervical spine were also generated. COMPARISON:  10/15/2011 head CT FINDINGS: CT HEAD FINDINGS Brain: No evidence of acute infarction, hemorrhage, hydrocephalus, extra-axial collection or mass lesion/mass effect. Chronic microvascular disease with mild for age ischemic gliosis in the cerebral white matter. Cavity in the left thalamus which was related to hemorrhage in 2013. There is more lateral and inferior lacune. Normal brain volume. Vascular: Atherosclerotic calcification. No hyperdense vessel suspected. Skull: No acute finding Sinuses/Orbits: Negative globes. CT CERVICAL SPINE FINDINGS Alignment: Straightening with mild anterolisthesis at C3-4, facet mediated. Skull base and vertebrae: No acute fracture. No primary bone lesion or focal pathologic process. Soft tissues and spinal canal: No evidence of inflammation or mass. No evidence of epidural hematoma. 15 mm left thyroid nodule. Disc levels: Degenerative disc narrowing that is advanced from C3-4 to C6-7, with spurring. Uncovertebral spurs narrow the bilateral  foramina, up to moderate at C3-4. No evidence of cord impingement. Facet arthropathy with spurring greatest at C3-4. Upper chest: No acute finding IMPRESSION: 1. No acute intracranial or cervical spine finding. 2. 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 3. Advanced cervical disc degeneration from C3-4 to C6-7. Uncovertebral spurs cause up to moderate foraminal narrowing on the left at C3-4. 4. 15 mm left thyroid nodule, at the size threshold where outpatient follow-up sonography is recommended. Electronically Signed   By: Monte Fantasia M.D.   On: 12/06/2016 14:57    ____________________________________________   PROCEDURES  Procedure(s) performed: None  Procedures  Critical Care performed:  No  ____________________________________________   INITIAL IMPRESSION / ASSESSMENT AND PLAN / ED COURSE  Pertinent labs & imaging results that were available during my care of the patient were reviewed by me and considered in my medical decision making (see chart for details).  Patient presents for evaluation of left sided throbbing headache. Reports it starts in the left upper neck and radiates out over the left side scalp. Review of records indicates that this is been ongoing issue for a long time, and the patient is very well able to describe the chronicity of this. Reports the pain just keeps continuing to get worse, so the point that he seeking additional pain relief. I discussed with him and we reviewed the medications he is been on the past   From [facility] "Previous Treatment:  Prophylactic: Amitriptyline, Nortriptyline,Topamax, Cymbalta, Cozaar. Depakote, Zonasimide, Lyrica, Gabapentin, Atenolol. Losartan. Mirtazapine.  Abortive: Imitrex, IB, Fioricet/Fiorinal, Tramadol. Tylenol. Injections: Botox-2 rounds-no benefits, last one 08/2014.  Restarted on 10/19/2015.   C.S.MRI 03/13/2015: 1. 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  2. Mild-moderate neuroforaminal narrowing most prominent on the left at C3-C7."  Had extensive attempts at treating this. We discussed medications, and the patient also reports that he is tried Vicodin in the past which he obtained a pill from his son and dulled his pain slightly but made him feel very "loopy" he does not wish to tried this type of medication again.  He does not have a sudden onset headache, does not appear thunderclap in nature, is not the worst headache he ever experienced, he reports is quite chronic for 5+ years with multiple attempts at other treatments. We discussed treatment options here, and basically everything that we discussed CT reports that he is tried  in the past. I also noted that I don't perform Botox or localized neck injections, the patient understands this. Reports he is hoping that there was some medication that I knew that he did not try in the past.    Clinical Course as of Dec 07 1835  Fri Dec 06, 2016  1444 I offered the patient pain medication, but he drove himself here and wishes to be able to drive himself home. Also every medication we discussed he reports that he's tried in the past, he doesn't want to try anything that he struck in the past as he knows that they don't work. Patient thus requested that he would like to have CT scan which I discussed to assure that nothing new and changed her showed concern it could be related to his headache and neck pain, but he refused to be medicated stating that essentially he tried everything.  [MQ]  1660 Patient in tears, reports pain is bad in the left neck. Reports he is willing to allow me  to give him some pain medication and can have someone else drive him home when his pain is better. Reviewed, will attempt oxycodone one tab and reeeval.  [MQ]    Clinical Course User Index [MQ] Delman Kitten, MD   ----------------------------------------- 6:37 PM on 12/06/2016 -----------------------------------------  Patient given prescriptions as outlined by Dr. Weber Cooks. Patient is agreeable to close outpatient follow-up and evaluation by neurology. He was seen by psychiatry as he expresses significant hopelessness regarding his ongoing chronic pain, but is agreeable to careful return precautions and treatment recommendations. He is also agreeable to not driving this evening, his wife will pick him up from the ER  Return precautions and treatment recommendations and follow-up discussed with the patient who is agreeable with the plan.   ____________________________________________   FINAL CLINICAL IMPRESSION(S) / ED DIAGNOSES  Final diagnoses:  Chronic intractable headache, unspecified headache  type      NEW MEDICATIONS STARTED DURING THIS VISIT:  Current Discharge Medication List    START taking these medications   Details  DULoxetine (CYMBALTA) 30 MG capsule Take 1 capsule (30 mg total) by mouth daily. Qty: 30 capsule, Refills: 0    gabapentin (NEURONTIN) 100 MG capsule Take 1 capsule (100 mg total) by mouth 3 (three) times daily. Qty: 90 capsule, Refills: 0         Note:  This document was prepared using Dragon voice recognition software and may include unintentional dictation errors.     Delman Kitten, MD 12/06/16 [phone]

## 2016-12-06 NOTE — ED Notes (Signed)
Patient transported to CT 

## 2016-12-06 NOTE — ED Notes (Signed)
Dr. Weber Cooks at bedside with patient.

## 2017-01-27 ENCOUNTER — Other Ambulatory Visit: Payer: Self-pay | Admitting: Psychiatry

## 2017-03-03 ENCOUNTER — Ambulatory Visit: Payer: Medicare Other | Attending: Nurse Practitioner | Admitting: Nurse Practitioner

## 2017-03-03 ENCOUNTER — Encounter: Payer: Self-pay | Admitting: Nurse Practitioner

## 2017-03-03 ENCOUNTER — Other Ambulatory Visit: Payer: Self-pay | Admitting: Nurse Practitioner

## 2017-03-03 VITALS — BP 180/113 | HR 86 | Temp 97.9°F | Ht 68.0 in | Wt 190.0 lb

## 2017-03-03 DIAGNOSIS — I129 Hypertensive chronic kidney disease with stage 1 through stage 4 chronic kidney disease, or unspecified chronic kidney disease: Secondary | ICD-10-CM | POA: Diagnosis not present

## 2017-03-03 DIAGNOSIS — M5481 Occipital neuralgia: Secondary | ICD-10-CM

## 2017-03-03 DIAGNOSIS — Z79891 Long term (current) use of opiate analgesic: Secondary | ICD-10-CM | POA: Diagnosis not present

## 2017-03-03 DIAGNOSIS — Z8673 Personal history of transient ischemic attack (TIA), and cerebral infarction without residual deficits: Secondary | ICD-10-CM | POA: Diagnosis not present

## 2017-03-03 DIAGNOSIS — F1190 Opioid use, unspecified, uncomplicated: Secondary | ICD-10-CM | POA: Diagnosis not present

## 2017-03-03 DIAGNOSIS — G894 Chronic pain syndrome: Secondary | ICD-10-CM | POA: Diagnosis not present

## 2017-03-03 DIAGNOSIS — M47812 Spondylosis without myelopathy or radiculopathy, cervical region: Secondary | ICD-10-CM | POA: Diagnosis not present

## 2017-03-03 DIAGNOSIS — M4692 Unspecified inflammatory spondylopathy, cervical region: Secondary | ICD-10-CM

## 2017-03-03 DIAGNOSIS — F419 Anxiety disorder, unspecified: Secondary | ICD-10-CM | POA: Diagnosis not present

## 2017-03-03 DIAGNOSIS — Z87891 Personal history of nicotine dependence: Secondary | ICD-10-CM | POA: Insufficient documentation

## 2017-03-03 DIAGNOSIS — R4781 Slurred speech: Secondary | ICD-10-CM | POA: Insufficient documentation

## 2017-03-03 DIAGNOSIS — E7800 Pure hypercholesterolemia, unspecified: Secondary | ICD-10-CM | POA: Diagnosis not present

## 2017-03-03 DIAGNOSIS — K922 Gastrointestinal hemorrhage, unspecified: Secondary | ICD-10-CM | POA: Insufficient documentation

## 2017-03-03 DIAGNOSIS — M25561 Pain in right knee: Secondary | ICD-10-CM | POA: Diagnosis not present

## 2017-03-03 DIAGNOSIS — R51 Headache: Secondary | ICD-10-CM

## 2017-03-03 DIAGNOSIS — F329 Major depressive disorder, single episode, unspecified: Secondary | ICD-10-CM | POA: Insufficient documentation

## 2017-03-03 DIAGNOSIS — G8929 Other chronic pain: Secondary | ICD-10-CM

## 2017-03-03 DIAGNOSIS — M542 Cervicalgia: Secondary | ICD-10-CM | POA: Diagnosis not present

## 2017-03-03 DIAGNOSIS — R4789 Other speech disturbances: Secondary | ICD-10-CM | POA: Insufficient documentation

## 2017-03-03 DIAGNOSIS — M25562 Pain in left knee: Secondary | ICD-10-CM

## 2017-03-03 DIAGNOSIS — R519 Headache, unspecified: Secondary | ICD-10-CM | POA: Insufficient documentation

## 2017-03-03 DIAGNOSIS — R531 Weakness: Secondary | ICD-10-CM | POA: Insufficient documentation

## 2017-03-03 NOTE — Progress Notes (Signed)
Safety precautions to be maintained throughout the outpatient stay will include: orient to surroundings, keep bed in low position, maintain call bell within reach at all times, provide assistance with transfer out of bed and ambulation.  

## 2017-03-03 NOTE — Progress Notes (Signed)
Patient's Name: Carl [name] [name]  MRN: [id number]  Referring Provider: Hortencia Pilar, MD  DOB: 09/13/1945  PCP: No primary care provider on file.  DOS: 03/03/2017  Note by: Dionisio David NP  Service setting: Ambulatory outpatient  Specialty: Interventional Pain Management  Location: [facility] (AMB) Pain Management Facility    Patient type: New Patient    Primary Reason(s) for Visit: Initial Patient Evaluation CC: Neck Pain (C2- C4 per patient statement); Headache; and Back Pain (upper)  HPI  Carl [name] [name] is a 71 y.o. year old, male patient, who comes today for an initial evaluation. He has GOUT; ALCOHOL WITHDRAWAL; ALCOHOL ABUSE; ANXIETY, SITUATIONAL; Essential hypertension; LUNG NODULE; UNSPECIFIED PROSTATITIS; CELLULITIS, HAND, LEFT; CONTACT DERMATITIS&OTHER ECZEMA DUE TO PLANTS; Osteoarthrosis and allied disorders; CHEST PAIN; IMPAIRED FASTING GLUCOSE; NECK SPRAIN AND STRAIN; Thalamic pain syndrome; Adjustment disorder with mixed disturbance of emotions and conduct; Alcohol dependence in remission ([place]); Anxiety and depression; Cataracts, bilateral; Cervical spondylosis without myelopathy; Cervicalgia of occipito-atlanto-axial region; Chronic pain disorder; Cognitive disorder; DDD (degenerative disc disease), cervical; Depression, major, single episode, moderate ([place]); Facet arthropathy, 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[place]); Fuchs' corneal dystrophy; GI bleed; History of stroke; Intractable 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; LVH (left ventricular hypertrophy); Mitral insufficiency; Neck pain (primary) (left; Neuralgia, neuritis, and radiculitis, unspecified; Nocturia; Occipital neuralgia; Persistent insomnia; Pure hypercholesterolemia; Right sided weakness; Slurred speech; Tricuspid insufficiency; Word finding difficulty; Opiate use; Chronic headaches; Chronic pain of both knees; Left facial pain (secondary); and Occipital neuralgia of left side (tertiary) on his problem list.. His primarily concern today is the  Neck Pain (C2- C4 per patient statement); Headache; and Back Pain (upper)  Pain Assessment: Location:   Neck (headache, upper back pain) Radiating: radiates to face and eye Onset: More than a month ago Duration: Chronic pain Quality: Aching, Constant Severity: 8 /10 (self-reported pain score)  Note: Reported level is compatible with observation.                   Effect on ADL:   Timing: Constant Modifying factors: "nothing really helps once it gets started"  Onset and Duration: Gradual Cause of pain: other Severity: NAS-11 at its worse: 8/10 and NAS-11 now: 8/10 Timing: Afternoon, After activity or exercise and After a period of immobility Aggravating Factors: Motion Alleviating Factors: Cold packs, Lying down, Medications, Resting and Chiropractic manipulations Associated Problems: Erectile dysfunction, Fatigue, Impotence, Inability to concentrate and Pain that does not allow patient to sleep Quality of Pain: Aching, Agonizing, Annoying, Constant, Disabling, Distressing, Exhausting, Nagging, Punishing, Tiring and Uncomfortable Previous Examinations or Tests: Bone scan, CT scan, MRI scan and Chiropractic evaluation Previous Treatments: Chiropractic manipulations and Epidural steroid injections  The patient comes into the clinics today for the first time for a chronic pain management evaluation. According to the patient's primary pain is in his neck. He admits that the left is greater than the right. He has radicular symptoms that radiate around his head into his left eye. He has been experiencing these symptoms since 2000. He denies any precipitating factors. He did suffer a stroke in 2013. He has some right-sided weakness secondary to stroke. He denies any previous surgery on his neck. He has had multiple injections which she states were not effective. He has had Botox, occipital nerve blocks,. He states that he has seen Dr. Silverio Decamp. He has been seen by Dr Evalee Mutton  neurosurgeon, Dr Rosine Door, Dr Theda Sers and a few other providers within [facility] neurology and neurosurgery.  Today I took the time to  provide the patient with information regarding this pain practice. The patient was informed that the practice is divided into two sections: an interventional pain management section, as well as a completely separate and distinct medication management section. I explained that there are procedure days for interventional therapies, and evaluation days for follow-ups and medication management. Because of the amount of documentation required during both, they are kept separated. This means that there is the possibility that he may be scheduled for a procedure on one day, and medication management the next. I have also informed him that because of staffing and facility limitations, this practice will no longer take patients for medication management only. To illustrate the reasons for this, I gave the patient the example of surgeons, and how inappropriate it would be to refer a patient to his/her care, just to write for the post-surgical antibiotics on a surgery done by a different surgeon.   Because interventional pain management is part of the board-certified specialty for the doctors, the patient was informed that joining this practice means that they are open to any and all interventional therapies. I made it clear that this does not mean that they will be forced to have any procedures done. What this means is that I believe interventional therapies to be essential part of the diagnosis and proper management of chronic pain conditions. Therefore, patients not interested in these interventional alternatives will be better served under the care of a different practitioner.  The patient was also made aware of my Comprehensive Pain Management Safety Guidelines where by joining this practice, they limit all of their nerve blocks and joint injections to those done by our practice, for as long  as we are retained to manage their care. Historic Controlled Substance Pharmacotherapy Review  PMP and historical list of controlled substances: Hydrocodone/acetaminophen 10/325 mg, Lyrica 75 mg, diazepam 10 mg Highest opioid analgesic regimen found: Hydrocodone 10/325 mg twice daily (fill date 12/28/2013) Most recent opioid analgesic: Hydrocodone/acetaminophen 10/325 mg(fill date 12/28/2013) Current opioid analgesics: None Highest recorded MME/day: 19.57 mg/day MME/day: 0 mg/day Medications: The patient did not bring the medication(s) to the appointment, as requested in our "New Patient Package" Pharmacodynamics: Desired effects: Analgesia: The patient reports >50% benefit. Reported improvement in function: The patient reports medication allows him to accomplish basic ADLs. Clinically meaningful improvement in function (CMIF): Sustained CMIF goals met Perceived effectiveness: Described as relatively effective, allowing for increase in activities of daily living (ADL) Undesirable effects: Side-effects or Adverse reactions: None reported Historical Monitoring: The patient  has no drug history on file. List of all UDS Test(s): Lab Results  Component Value Date   COCAINSCRNUR NONE DETECTED 10/08/2011   THCU NONE DETECTED 10/08/2011   List of all Serum Drug Screening Test(s):  No results found for: AMPHSCRSER, BARBSCRSER, BENZOSCRSER, COCAINSCRSER, PCPSCRSER, PCPQUANT, THCSCRSER, CANNABQUANT, OPIATESCRSER, OXYSCRSER, PROPOXSCRSER Historical Background Evaluation: [place] PDMP: Six (6) year initial data search conducted.             [employer] Department of public safety, offender search: Editor, commissioning Information) Non-contributory Risk Assessment Profile: Aberrant behavior: Alcohol Abuse Risk factors for fatal opioid overdose: Male gender, Caucasian and Multiple prescriptions Fatal overdose hazard ratio (HR): 1.32 for 20-49 MME/day Non-fatal overdose hazard ratio (HR): 1.44 for 20-49 MME/day Risk of  opioid abuse or dependence: 0.7-3.0% with doses ? 36 MME/day and 6.1-26% with doses ? 120 MME/day. Substance use disorder (SUD) risk level: Pending results of Medical Psychology Evaluation for SUD Opioid risk tool (ORT) (Total Score): 6  ORT Scoring interpretation table:  Score <3 = Low Risk for SUD  Score between 4-7 = Moderate Risk for SUD  Score >8 = High Risk for Opioid Abuse   PHQ-2 Depression Scale:  Total score: 2  PHQ-2 Scoring interpretation table: (Score and probability of major depressive disorder)  Score 0 = No depression  Score 1 = 15.4% Probability  Score 2 = 21.1% Probability  Score 3 = 38.4% Probability  Score 4 = 45.5% Probability  Score 5 = 56.4% Probability  Score 6 = 78.6% Probability   PHQ-9 Depression Scale:  Total score: 2  PHQ-9 Scoring interpretation table:  Score 0-4 = No depression  Score 5-9 = Mild depression  Score 10-14 = Moderate depression  Score 15-19 = Moderately severe depression  Score 20-27 = Severe depression (2.4 times higher risk of SUD and 2.89 times higher risk of overuse)   Pharmacologic Plan: Pending ordered tests and/or consults  Meds  The patient has a current medication list which includes the following prescription(s): acetaminophen, aspirin-acetaminophen-caffeine, atorvastatin, carvedilol, clonidine, doxazosin, hydralazine, losartan, colchicine, and pantoprazole.  Current Outpatient Prescriptions on File Prior to Visit  Medication Sig  . atorvastatin (LIPITOR) 10 MG tablet Take 10 mg by mouth every evening.  . carvedilol (COREG) 12.5 MG tablet Take 12.5 mg by mouth 2 (two) times daily.   . cloNIDine (CATAPRES) 0.1 MG tablet Take 0.1 mg by mouth 2 (two) times daily.  Marland Kitchen doxazosin (CARDURA) 8 MG tablet Take 8 mg by mouth at bedtime.    . hydrALAZINE (APRESOLINE) 100 MG tablet Take 100 mg by mouth 3 (three) times daily.  Marland Kitchen losartan (COZAAR) 100 MG tablet Take 100 mg by mouth daily.   . colchicine 0.6 MG tablet Take 1 tablet (0.6  mg total) by mouth daily. (Patient not taking: Reported on 12/06/2016)  . pantoprazole (PROTONIX) 40 MG tablet Take 1 tablet (40 mg total) by mouth daily at 12 noon. (Patient not taking: Reported on 12/06/2016)   No current facility-administered medications on file prior to visit.    Imaging Review  Cervical Imaging:  Cervical MR wo contrast:  Results for orders placed in visit on 09/21/11  MR C Spine Ltd W/O Cm   Narrative * PRIOR REPORT IMPORTED FROM AN EXTERNAL SYSTEM *   PRIOR REPORT IMPORTED FROM THE [facility] WORKFLOW SYSTEM   REASON FOR EXAM:    headaches  neck pain  COMMENTS:   PROCEDURE:     MR  - MR CERVICAL SPINE WO CONT  - Sep 21 2011  3:16PM   RESULT:   HISTORY: Headache and neck pain.   COMPARISON STUDIES:  MRI of cervical spine 04/30/2010.   PROCEDURE AND FINDINGS:  Multiplanar, multisequence imaging of the  cervical  spine is obtained. No paraspinal lesions are noted. Again noted is  multilevel disc degeneration and multilevel annular bulge with multilevel  end-plate osteophyte formation. There is multilevel bilateral mild to  moderate neural foraminal narrowing. These changes are stable from prior  exam. No high-grade spinal stenosis or prominent disc protrusion noted.  The  cervical cord is normal. No acute bony lesion identified. The  craniovertebral junction is normal.   IMPRESSION:      Multilevel stable degenerative disc disease.   Thank you for this opportunity to contribute to the care of your patient.       Cervical CT wo contrast:  Results for orders placed during the hospital encounter of 12/06/16  CT Cervical Spine Wo Contrast   Narrative CLINICAL  DATA:  Headache for 4 years, gradually worsening. Left neck pain. Occipital pain.  EXAM: CT HEAD WITHOUT CONTRAST  CT CERVICAL SPINE WITHOUT CONTRAST  TECHNIQUE: Multidetector CT imaging of the head and cervical spine was performed following the standard protocol without intravenous contrast.  Multiplanar CT image reconstructions of the cervical spine were also generated.  COMPARISON:  10/15/2011 head CT  FINDINGS: CT HEAD FINDINGS  Brain: No evidence of acute infarction, hemorrhage, hydrocephalus, extra-axial collection or mass lesion/mass effect.  Chronic microvascular disease with mild for age ischemic gliosis in the cerebral white matter. Cavity in the left thalamus which was related to hemorrhage in 2013. There is more lateral and inferior lacune. Normal brain volume.  Vascular: Atherosclerotic calcification. No hyperdense vessel suspected.  Skull: No acute finding  Sinuses/Orbits: Negative globes.  CT CERVICAL SPINE FINDINGS  Alignment: Straightening with mild anterolisthesis at C3-4, facet mediated.  Skull base and vertebrae: No acute fracture. No primary bone lesion or focal pathologic process.  Soft tissues and spinal canal: No evidence of inflammation or mass. No evidence of epidural hematoma. 15 mm left thyroid nodule.  Disc levels: Degenerative disc narrowing that is advanced from C3-4 to C6-7, with spurring. Uncovertebral spurs narrow the bilateral foramina, up to moderate at C3-4. No evidence of cord impingement. Facet arthropathy with spurring greatest at C3-4.  Upper chest: No acute finding  IMPRESSION: 1. No acute intracranial or cervical spine finding. 2. 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 3. Advanced cervical disc degeneration from C3-4 to C6-7. Uncovertebral spurs cause up to moderate foraminal narrowing on the left at C3-4. 4. 15 mm left thyroid nodule, at the size threshold where outpatient follow-up sonography is recommended.   Electronically Signed   By: Monte Fantasia M.D.   On: 12/06/2016 14:57    Knee Imaging:  Knee-R DG 1-2 views:  Results for orders placed during the hospital encounter of 10/08/11  DG Knee 1-2 Views Right   Narrative *RADIOLOGY REPORT*  Clinical Data: Pain  and swelling  RIGHT KNEE - 1-2 VIEW  Comparison: None.  Findings: There is a moderately large effusion in the suprapatellar bursa.  Negative for fracture, dislocation, or other acute bony abnormality.  No significant osseous degenerative change.  Normal mineralization and alignment.  IMPRESSION:  1.  Effusion without fracture or other acute bony abnormality.  Original Report Authenticated By: Trecia Rogers, M.D.   Knee-R DG 4 views:  Results for orders placed in visit on 11/25/11  DG Knee Complete 4 Views Right   Narrative * PRIOR REPORT IMPORTED FROM AN EXTERNAL SYSTEM *   PRIOR REPORT IMPORTED FROM THE [facility] WORKFLOW SYSTEM   REASON FOR EXAM:    R knee pain  COMMENTS:   PROCEDURE:     DXR - DXR KNEE RT COMP WITH OBLIQUES  - Nov 25 2011  9:41AM   RESULT:     No fracture, dislocation or other acute bony abnormality is  identified. The knee joint space is well maintained. The patella is  intact.   IMPRESSION:     No acute changes are identified.   Thank you for this opportunity to contribute to the care of your patient.        Note: Available results from prior imaging studies were reviewed.        ROS  Cardiovascular History: No reported cardiovascular signs or symptoms such as High blood pressure, coronary artery disease, abnormal heart rate or rhythm, heart attack, blood thinner therapy  or heart weakness and/or failure Pulmonary or Respiratory History: Smoking Neurological History: Stroke (Residual deficits or weakness: na) Review of Past Neurological Studies:  Results for orders placed or performed during the hospital encounter of 12/06/16  CT Head Wo Contrast   Narrative   CLINICAL DATA:  Headache for 4 years, gradually worsening. Left neck pain. Occipital pain.  EXAM: CT HEAD WITHOUT CONTRAST  CT CERVICAL SPINE WITHOUT CONTRAST  TECHNIQUE: Multidetector CT imaging of the head and cervical spine was performed following the standard protocol  without intravenous contrast. Multiplanar CT image reconstructions of the cervical spine were also generated.  COMPARISON:  10/15/2011 head CT  FINDINGS: CT HEAD FINDINGS  Brain: No evidence of acute infarction, hemorrhage, hydrocephalus, extra-axial collection or mass lesion/mass effect.  Chronic microvascular disease with mild for age ischemic gliosis in the cerebral white matter. Cavity in the left thalamus which was related to hemorrhage in 2013. There is more lateral and inferior lacune. Normal brain volume.  Vascular: Atherosclerotic calcification. No hyperdense vessel suspected.  Skull: No acute finding  Sinuses/Orbits: Negative globes.  CT CERVICAL SPINE FINDINGS  Alignment: Straightening with mild anterolisthesis at C3-4, facet mediated.  Skull base and vertebrae: No acute fracture. No primary bone lesion or focal pathologic process.  Soft tissues and spinal canal: No evidence of inflammation or mass. No evidence of epidural hematoma. 15 mm left thyroid nodule.  Disc levels: Degenerative disc narrowing that is advanced from C3-4 to C6-7, with spurring. Uncovertebral spurs narrow the bilateral foramina, up to moderate at C3-4. No evidence of cord impingement. Facet arthropathy with spurring greatest at C3-4.  Upper chest: No acute finding  IMPRESSION: 1. No acute intracranial or cervical spine finding. 2. 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 3. Advanced cervical disc degeneration from C3-4 to C6-7. Uncovertebral spurs cause up to moderate foraminal narrowing on the left at C3-4. 4. 15 mm left thyroid nodule, at the size threshold where outpatient follow-up sonography is recommended.   Electronically Signed   By: Marnee Spring M.D.   On: 12/06/2016 14:57   Results for orders placed or performed in visit on 09/21/11  MR Brain W Wo Contrast   Narrative   * PRIOR REPORT IMPORTED FROM AN EXTERNAL SYSTEM    PRIOR  REPORT IMPORTED FROM THE [facility] WORKFLOW SYSTEM   REASON FOR EXAM:    headaches  neck pain  COMMENTS:   PROCEDURE:     MR  - MR BRAIN WO/W CONTRAST  - Sep 21 2011  4:10PM   RESULT:   HISTORY: Visual changes, neck pain, and headaches.   COMPARISON STUDIES:  Head CT of 04/28/2010.   PROCEDURE AND FINDINGS:   Standard MRI obtained. 20 ml of Multi-Hance  administered. Diffusion weighted images are normal. No evidence of  ischemia.  No mass lesion or enhancing lesion. Old lacunar infarct noted in the  region  of the left lenticular nucleus. Periventricular changes are noted  consistent  with chronic ischemia. Vascular flow-voids are normal.   IMPRESSION:      Chronic white matter ischemia. Old lacunar infarct noted  in  the left lenticular nucleus. No acute abnormality.   Thank you for this opportunity to contribute to the care of your patient.       Psychological-Psychiatric History: No reported psychological or psychiatric signs or symptoms such as difficulty sleeping, anxiety, depression, delusions or hallucinations (schizophrenial), mood swings (bipolar disorders) or suicidal ideations or attempts Gastrointestinal History: Vomiting blood (Ulcers) Genitourinary History: No  reported renal or genitourinary signs or symptoms such as difficulty voiding or producing urine, peeing blood, non-functioning kidney, kidney stones, difficulty emptying the bladder, difficulty controlling the flow of urine, or chronic kidney disease Hematological History: No reported hematological signs or symptoms such as prolonged bleeding, low or poor functioning platelets, bruising or bleeding easily, hereditary bleeding problems, low energy levels due to low hemoglobin or being anemic Endocrine History: No reported endocrine signs or symptoms such as high or low blood sugar, rapid heart rate due to high thyroid levels, obesity or weight gain due to slow thyroid or thyroid disease Rheumatologic History: No  reported rheumatological signs and symptoms such as fatigue, joint pain, tenderness, swelling, redness, heat, stiffness, decreased range of motion, with or without associated rash Musculoskeletal History: Negative for myasthenia gravis, muscular dystrophy, multiple sclerosis or malignant hyperthermia Work History: Retired  Allergies  Mr. Jarboe is allergic to ace inhibitors; hydrochlorothiazide; lisinopril; nsaids; and ramipril.  Laboratory Chemistry  Inflammation Markers Lab Results  Component Value Date   CRP 0.3 03/03/2017   ESRSEDRATE 2 03/03/2017   (CRP: Acute Phase) (ESR: Chronic Phase) Renal Function Markers Lab Results  Component Value Date   BUN 13 03/03/2017   CREATININE 1.23 03/03/2017   GFRAA 68 03/03/2017   GFRNONAA 59 (L) 03/03/2017   Hepatic Function Markers Lab Results  Component Value Date   AST 18 03/03/2017   ALT 13 03/03/2017   ALBUMIN 4.8 03/03/2017   ALKPHOS 52 03/03/2017   Electrolytes Lab Results  Component Value Date   NA 143 03/03/2017   K 3.4 (L) 03/03/2017   CL 99 03/03/2017   CALCIUM 10.0 03/03/2017   MG 1.9 10/10/2011   Neuropathy Markers Lab Results  Component Value Date   VITAMINB12 290 03/03/2017   Bone Pathology Markers Lab Results  Component Value Date   ALKPHOS 52 03/03/2017   25OHVITD1 WILL FOLLOW 03/03/2017   25OHVITD2 WILL FOLLOW 03/03/2017   25OHVITD3 WILL FOLLOW 03/03/2017   CALCIUM 10.0 03/03/2017   Coagulation Parameters Lab Results  Component Value Date   INR 1.1 11/25/2011   LABPROT 14.7 11/25/2011   APTT 29.6 11/25/2011   PLT 168 11/25/2011   Cardiovascular Markers Lab Results  Component Value Date   HGB 10.6 (L) 11/25/2011   HCT 31.7 (L) 11/25/2011   Note: Lab results reviewed.  PFSH  Drug: Mr. [place]  has no drug history on file. Alcohol:  reports that he drinks alcohol. Tobacco:  reports that he quit smoking about 38 years ago. He has never used smokeless tobacco. 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 Family: family history includes Fibromyalgia in his mother.  Past Surgical History:  Procedure Laterality Date  . CYSTOGRAM  03/08   Active Ambulatory Problems    Diagnosis Date Noted  . GOUT 01/02/2007  . ALCOHOL WITHDRAWAL 04/24/2009  . ALCOHOL ABUSE 05/15/2009  . ANXIETY, SITUATIONAL 06/15/2010  . Essential hypertension 01/02/2007  . LUNG NODULE 07/17/2009  . UNSPECIFIED PROSTATITIS 06/15/2009  . CELLULITIS, HAND, LEFT 05/03/2010  . CONTACT DERMATITIS&OTHER ECZEMA DUE TO PLANTS  01/12/2010  . Osteoarthrosis and allied disorders 06/15/2010  . CHEST PAIN 07/13/2009  . IMPAIRED FASTING GLUCOSE 11/04/2007  . NECK SPRAIN AND STRAIN 01/03/2010  . 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 Alcohol dependence in remission ([place]) 03/14/2011  . Anxiety and depression 05/05/2013  . Cataracts, bilateral 08/21/2012  . Cervical spondylosis without myelopathy 05/16/2015  . Cervicalgia of occipito-atlanto-axial region 02/09/2015  . Chronic pain disorder 09/09/2013  . Cognitive disorder 06/23/2013  . DDD (degenerative disc disease), cervical 03/14/2011  . Depression, major, single episode, moderate ([place]) 06/23/2013  . Facet arthropathy, cervical  ([place]) 05/05/2013  . Fuchs' corneal dystrophy 08/21/2012  . GI bleed 03/03/2017  . History of stroke 03/03/2017  . 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 LVH (left ventricular hypertrophy) 02/19/2013  . Mitral insufficiency 02/19/2013  . Neck pain (primary) (left 05/05/2013  . Neuralgia, neuritis, and radiculitis, unspecified 09/09/2013  . Nocturia 04/22/2013  . Occipital neuralgia 02/20/2012  . Persistent insomnia 03/14/2011  . Pure hypercholesterolemia 02/19/2013  . Right sided weakness 03/03/2017  . Slurred speech 03/03/2017  . Tricuspid insufficiency 02/19/2013  . Word finding difficulty 03/03/2017  . Opiate use 03/03/2017  . Chronic headaches 03/03/2017  . Chronic pain of both knees 03/03/2017  . Left facial pain (secondary) 03/04/2017  . Occipital neuralgia of left side (tertiary) 03/04/2017   Resolved Ambulatory Problems    Diagnosis Date Noted  . No Resolved Ambulatory Problems   Past Medical History:  Diagnosis Date  . Alcoholism ([place])   . Anxiety and depression   . Cataract   . Cervical spondylosis without myelopathy   . Cervicalgia of occipito-atlanto-axial region   . Chronic pain disorder   . Degeneration of cervical intervertebral disc   . Duodenal ulcer 2011  . 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 Fuchs' corneal dystrophy   . Gastric ulcer 2011  . GI bleed   . Gout   . Hypertension   . Impaired fasting glucose   . 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 LVH (left ventricular hypertrophy)   . Mitral insufficiency   . Neck pain   . Neuralgia and neuritis   . Nocturia   . Occipital neuralgia   . Osteoarthritis   . Osteoarthritis   . Pure hypercholesterolemia   . Radiculitis   . Stroke ([place])   . Tricuspid insufficiency    Constitutional Exam  General appearance: alert, cooperative, in moderate distress and dehydrated Vitals:   03/03/17 1400  BP: (!) 180/113   Pulse: 86  Temp: 97.9 F (36.6 C)  TempSrc: Oral  SpO2: 98%  Weight: 190 lb (86.2 kg)  Height: '5\' 8"'$  (1.727 m)   BMI Assessment: Estimated body mass index is 28.89 kg/m as calculated from the following:   Height as of this encounter: '5\' 8"'$  (1.727 m).   Weight as of this encounter: 190 lb (86.2 kg).  BMI interpretation table: BMI level Category Range association with higher incidence of chronic pain  <18 kg/m2 Underweight   18.5-24.9 kg/m2 Ideal body weight   25-29.9 kg/m2 Overweight Increased incidence by 20%  30-34.9 kg/m2 Obese (Class I) Increased incidence by 68%  35-39.9 kg/m2 Severe obesity (Class II) Increased incidence by 136%  >40 kg/m2 Extreme obesity (Class III) Increased incidence by 254%   BMI Readings from Last 4 Encounters:  03/03/17 28.89 kg/m  12/06/16 31.64 kg/m  10/15/11 29.32  kg/m  06/15/10 33.20 kg/m   Wt Readings from Last 4 Encounters:  03/03/17 190 lb (86.2 kg)  12/06/16 196 lb (88.9 kg)  10/15/11 204 lb 5.9 oz (92.7 kg)  06/15/10 212 lb (96.2 kg)  Psych/Mental status: Alert, oriented x 3 (person, place, & time)       Eyes: PERLA Respiratory: No evidence of acute respiratory distress  Cervical Spine Exam  Inspection: No masses, redness, or swelling Alignment: Symmetrical Functional ROM: Pain restricted ROM      Stability: No instability detected Muscle strength & Tone: Guarding observed Sensory: Unimpaired Palpation: Complains of area being tender to palpation              Upper Extremity (UE) Exam    Side: Right upper extremity  Side: Left upper extremity  Inspection: No masses, redness, swelling, or asymmetry. No contractures  Inspection: No masses, redness, swelling, or asymmetry. No contractures  Functional ROM: Unrestricted ROM          Functional ROM: Unrestricted ROM          Muscle strength & Tone: Functionally intact  Muscle strength & Tone: Functionally intact  Sensory: Unimpaired  Sensory: Unimpaired  Palpation: No palpable  anomalies              Palpation: No palpable anomalies              Specialized Test(s): Deferred         Specialized Test(s): Deferred          Thoracic Spine Exam  Inspection: No masses, redness, or swelling Alignment: Symmetrical Functional ROM: Unrestricted ROM Stability: No instability detected Sensory: Unimpaired Muscle strength & Tone: No palpable anomalies  Lumbar Spine Exam  Inspection: No masses, redness, or swelling Alignment: Symmetrical Functional ROM: Unrestricted ROM      Stability: No instability detected Muscle strength & Tone: Functionally intact Sensory: Unimpaired Palpation: No palpable anomalies       Provocative Tests: Lumbar Hyperextension and rotation test: evaluation deferred today       Patrick's Maneuver: evaluation deferred today                    Gait & Posture Assessment  Ambulation: Unassisted Gait: Awkward Posture: WNL   Lower Extremity Exam    Side: Right lower extremity  Side: Left lower extremity  Inspection: No masses, redness, swelling, or asymmetry. No contractures  Inspection: No masses, redness, swelling, or asymmetry. No contractures  Functional ROM: Unrestricted ROM          Functional ROM: Unrestricted ROM          Muscle strength & Tone: Functionally intact  Muscle strength & Tone: Functionally intact  Sensory: Unimpaired  Sensory: Unimpaired  Palpation: No palpable anomalies  Palpation: No palpable anomalies   Assessment  Primary Diagnosis & Pertinent Problem List: The primary encounter diagnosis was Neck pain. Diagnoses of Left facial pain, Occipital neuralgia of left side (tertiary), Facet arthropathy, cervical (HCC), Occipital neuralgia, unspecified laterality, Cervical spondylosis without myelopathy, Chronic nonintractable headache, unspecified headache type, Cervicalgia of occipito-atlanto-axial region, Chronic pain of both knees, Chronic pain disorder, and Opiate use were also pertinent to this visit.  Visit Diagnosis: 1.  Neck pain   2. Left facial pain   3. Occipital neuralgia of left side (tertiary)   4. 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 Occipital neuralgia, unspecified laterality   6. Cervical spondylosis without myelopathy   7. Chronic nonintractable headache, unspecified headache type  8. Cervicalgia of occipito-atlanto-axial region   9. Chronic pain of both knees   10. Chronic pain disorder   11. Opiate use    Plan of Care  Initial treatment plan:  Please be advised that as per protocol, today's visit has been an evaluation only. We have not taken over the patient's controlled substance management.  Problem-specific plan: No problem-specific Assessment & Plan notes found for this encounter.  Ordered Lab-work, Procedure(s), Referral(s), & Consult(s): Orders Placed This Encounter  Procedures  . MR CERVICAL SPINE W WO CONTRAST  . MR MRV HEAD W WO CM  . Compliance Drug Analysis, Ur  . Magnesium  . 25-Hydroxyvitamin D Lcms D2+D3  . Vitamin B12  . Comprehensive metabolic panel  . C-reactive protein  . Sedimentation rate  . Uric acid  . ANA Comprehensive Panel  . Lyme Ab/Western Blot Reflex  . TSH  . Ambulatory referral to Psychology   Pharmacotherapy: Medications ordered:  No orders of the defined types were placed in this encounter.  Medications administered during this visit: Mr. Pullman had no medications administered during this visit.   Pharmacotherapy under consideration:  Opioid Analgesics: The patient was informed that there is no guarantee that he would be a candidate for opioid analgesics. The decision will be made following CDC guidelines. This decision will be based on the results of diagnostic studies, as well as Mr. Longest risk profile.  Membrane stabilizer: To be determined at a later time Muscle relaxant: To be determined at a later time NSAID: To be determined at a later time Other analgesic(s): To be determined at a later time   Interventional therapies under  consideration: Mr. Dom was informed that there is no guarantee that he would be a candidate for interventional therapies. The decision will be based on the results of diagnostic studies, as well as Mr. Primeau risk profile.  Possible procedure(s): Diagnostic left occipital nerve block Possible left occipital nerve radiofrequency ablation Possible left peripheral nerve stimulator trial Diagnostic left C2 + TON Diagnostic left C2 + TON radiofrequency ablation    Provider-requested follow-up: Return for 2nd Visit, w/ Dr. Dossie Arbour, after [facility] eval.  No future appointments.  Primary Care Physician: No primary care provider on file. Location: [facility] Note by:  Date: 03/03/2017; Time: 4:11 PM  Pain Score Disclaimer: We use the NRS-11 scale. This is a self-reported, subjective measurement of pain severity with only modest accuracy. It is used primarily to identify changes within a particular patient. It must be understood that outpatient pain scales are significantly less accurate that those used for research, where they can be applied under ideal controlled circumstances with minimal exposure to variables. In reality, the score is likely to be a combination of pain intensity and pain affect, where pain affect describes the degree of emotional arousal or changes in action readiness caused by the sensory experience of pain. Factors such as social and work situation, setting, emotional state, anxiety levels, expectation, and prior pain experience may influence pain perception and show large inter-individual differences that may also be affected by time variables.  Patient instructions provided during this appointment: Patient Instructions    ____________________________________________________________________________________________  Appointment Policy Summary  It is our goal and responsibility to provide the medical community with assistance in the evaluation  and management of patients with chronic pain. Unfortunately our resources are limited. Because we do not have an unlimited amount of time, or available appointments, we are required to closely monitor and manage their use.  The following rules exist to maximize their use:  Patient's responsibilities: 1. Punctuality:  At what time should I arrive? You should be physically present in our office 30 minutes before your scheduled appointment. Your scheduled appointment is with your assigned healthcare provider. However, it takes 5-10 minutes to be "checked-in", and another 15 minutes for the nurses to do the admission. If you arrive to our office at the time you were given for your appointment, you will end up being at least 20-25 minutes late to your appointment with the provider. 2. Tardiness:  What happens if I arrive only a few minutes after my scheduled appointment time? You will need to reschedule your appointment. The cutoff is your appointment time. This is why it is so important that you arrive at least 30 minutes before that appointment. If you have an appointment scheduled for 10:00 AM and you arrive at 10:01, you will be required to reschedule your appointment.  3. Plan ahead:  Always assume that you will encounter traffic on your way in. Plan for it. If you are dependent on a driver, make sure they understand these rules and the need to arrive early. 4. Other appointments and responsibilities:  Avoid scheduling any other appointments before or after your pain clinic appointments.  5. Be prepared:  Write down everything that you need to discuss with your healthcare provider and give this information to the admitting nurse. Write down the medications that you will need refilled. Bring your pills and bottles (even the empty ones), to all of your appointments, except for those where a procedure is scheduled. 6. No children or pets:  Find someone to take care of them. It is not appropriate to bring  them in. 7. Scheduling changes:  We request "advanced notification" of any changes or cancellations. 8. Advanced notification:  Defined as a time period of more than 24 hours prior to the originally scheduled appointment. This allows for the appointment to be offered to other patients. 9. Rescheduling:  When a visit is rescheduled, it will require the cancellation of the original appointment. For this reason they both fall within the category of "Cancellations".  10. Cancellations:  They require advanced notification. Any cancellation less than 24 hours before the  appointment will be recorded as a "No Show". 11. No Show:  Defined as an unkept appointment where the patient failed to notify or declare to the practice their intention or inability to keep the appointment.  Corrective process for repeat offenders:  1. Tardiness: Three (3) episodes of rescheduling due to late arrivals will be recorded as one (1) "No Show". 2. Cancellation or reschedule: Three (3) cancellations or rescheduling will be recorded as one (1) "No Show". 3. "No Shows": Three (3) "No Shows" within a 12 month period will result in discharge from the practice.  ____________________________________________________________________________________________  ____________________________________________________________________________________________  Pain Scale  Introduction: The pain score used by this practice is the Verbal Numerical Rating Scale (VNRS-11). This is an 11-point scale. It is for adults and children 10 years or older. There are significant differences in how the pain score is reported, used, and applied. Forget everything you learned in the past and learn this scoring system.  General Information: The scale should reflect your current level of pain. Unless you are specifically asked for the level of your worst pain, or your average pain. If you are asked for one of these two, then it should be understood that it  is over the past 24 hours.  Basic  Activities of Daily Living (ADL): Personal hygiene, dressing, eating, transferring, and using restroom.  Instructions: Most patients tend to report their level of pain as a combination of two factors, their physical pain and their psychosocial pain. This last one is also known as "suffering" and it is reflection of how physical pain affects you socially and psychologically. From now on, report them separately. From this point on, when asked to report your pain level, report only your physical pain. Use the following table for reference.  Pain Clinic Pain Levels (0-5/10)  Pain Level Score  Description  No Pain 0   Mild pain 1 Nagging, annoying, but does not interfere with basic activities of daily living (ADL). Patients are able to eat, bathe, get dressed, toileting (being able to get on and off the toilet and perform personal hygiene functions), transfer (move in and out of bed or a chair without assistance), and maintain continence (able to control bladder and bowel functions). Blood pressure and heart rate are unaffected. A normal heart rate for a healthy adult ranges from 60 to 100 bpm (beats per minute).   Mild to moderate pain 2 Noticeable and distracting. Impossible to hide from other people. More frequent flare-ups. Still possible to adapt and function close to normal. It can be very annoying and may have occasional stronger flare-ups. With discipline, patients may get used to it and adapt.   Moderate pain 3 Interferes significantly with activities of daily living (ADL). It becomes difficult to feed, bathe, get dressed, get on and off the toilet or to perform personal hygiene functions. Difficult to get in and out of bed or a chair without assistance. Very distracting. With effort, it can be ignored when deeply involved in activities.   Moderately severe pain 4 Impossible to ignore for more than a few minutes. With effort, patients may still be able to manage  work or participate in some social activities. Very difficult to concentrate. Signs of autonomic nervous system discharge are evident: dilated pupils (mydriasis); mild sweating (diaphoresis); sleep interference. Heart rate becomes elevated (>115 bpm). Diastolic blood pressure (lower number) rises above 100 mmHg. Patients find relief in laying down and not moving.   Severe pain 5 Intense and extremely unpleasant. Associated with frowning face and frequent crying. Pain overwhelms the senses.  Ability to do any activity or maintain social relationships becomes significantly limited. Conversation becomes difficult. Pacing back and forth is common, as getting into a comfortable position is nearly impossible. Pain wakes you up from deep sleep. Physical signs will be obvious: pupillary dilation; increased sweating; goosebumps; brisk reflexes; cold, clammy hands and feet; nausea, vomiting or dry heaves; loss of appetite; significant sleep disturbance with inability to fall asleep or to remain asleep. When persistent, significant weight loss is observed due to the complete loss of appetite and sleep deprivation.  Blood pressure and heart rate becomes significantly elevated. Caution: If elevated blood pressure triggers a pounding headache associated with blurred vision, then the patient should immediately seek attention at an urgent or emergency care unit, as these may be signs of an impending stroke.    Emergency Department Pain Levels (6-10/10)  Emergency Room Pain 6 Severely limiting. Requires emergency care and should not be seen or managed at an outpatient pain management facility. Communication becomes difficult and requires great effort. Assistance to reach the emergency department may be required. Facial flushing and profuse sweating along with potentially dangerous increases in heart rate and blood pressure will be evident.   Distressing pain  7 Self-care is very difficult. Assistance is required to  transport, or use restroom. Assistance to reach the emergency department will be required. Tasks requiring coordination, such as bathing and getting dressed become very difficult.   Disabling pain 8 Self-care is no longer possible. At this level, pain is disabling. The individual is unable to do even the most "basic" activities such as walking, eating, bathing, dressing, transferring to a bed, or toileting. Fine motor skills are lost. It is difficult to think clearly.   Incapacitating pain 9 Pain becomes incapacitating. Thought processing is no longer possible. Difficult to remember your own name. Control of movement and coordination are lost.   The worst pain imaginable 10 At this level, most patients pass out from pain. When this level is reached, collapse of the autonomic nervous system occurs, leading to a sudden drop in blood pressure and heart rate. This in turn results in a temporary and dramatic drop in blood flow to the brain, leading to a loss of consciousness. Fainting is one of the body's self defense mechanisms. Passing out puts the brain in a calmed state and causes it to shut down for a while, in order to begin the healing process.    Summary: 1. Refer to this scale when providing [place] with your pain level. 2. Be accurate and careful when reporting your pain level. This will help with your care. 3. Over-reporting your pain level will lead to loss of credibility. 4. Even a level of 1/10 means that there is pain and will be treated at our facility. 5. High, inaccurate reporting will be documented as "Symptom Exaggeration", leading to loss of credibility and suspicions of possible secondary gains such as obtaining more narcotics, or wanting to appear disabled, for fraudulent reasons. 6. Only pain levels of 5 or below will be seen at our facility. 7. Pain levels of 6 and above will be sent to the Emergency Department and the appointment  cancelled. ____________________________________________________________________________________________

## 2017-03-04 ENCOUNTER — Telehealth: Payer: Self-pay

## 2017-03-04 DIAGNOSIS — M5481 Occipital neuralgia: Secondary | ICD-10-CM | POA: Insufficient documentation

## 2017-03-04 NOTE — Patient Instructions (Signed)

## 2017-03-04 NOTE — Telephone Encounter (Signed)
Axed pt information to Dr Renata Caprice

## 2017-03-07 LAB — COMPLIANCE DRUG ANALYSIS, UR

## 2017-03-08 LAB — COMPREHENSIVE METABOLIC PANEL
ALBUMIN: 4.8 g/dL (ref 3.5–4.8)
ALT: 13 IU/L (ref 0–44)
AST: 18 IU/L (ref 0–40)
Albumin/Globulin Ratio: 1.8 (ref 1.2–2.2)
Alkaline Phosphatase: 52 IU/L (ref 39–117)
BUN / CREAT RATIO: 11 (ref 10–24)
BUN: 13 mg/dL (ref 8–27)
Bilirubin Total: 0.6 mg/dL (ref 0.0–1.2)
CO2: 23 mmol/L (ref 20–29)
CREATININE: 1.23 mg/dL (ref 0.76–1.27)
Calcium: 10 mg/dL (ref 8.6–10.2)
Chloride: 99 mmol/L (ref 96–106)
GFR calc Af Amer: 68 mL/min/{1.73_m2} (ref >59)
GFR calc non Af Amer: 59 mL/min/{1.73_m2} — ABNORMAL LOW (ref >59)
GLUCOSE: 100 mg/dL — AB (ref 65–99)
Globulin, Total: 2.7 g/dL (ref 1.5–4.5)
Potassium: 3.4 mmol/L — ABNORMAL LOW (ref 3.5–5.2)
SODIUM: 143 mmol/L (ref 134–144)
TOTAL PROTEIN: 7.5 g/dL (ref 6.0–8.5)

## 2017-03-08 LAB — ANA COMPREHENSIVE PANEL
Centromere Ab Screen: <0.2 AI (ref 0.0–0.9)
ENA RNP Ab: <0.2 AI (ref 0.0–0.9)
ENA SSA (RO) AB: 0.2 AI (ref 0.0–0.9)
Scleroderma SCL-70: <0.2 AI (ref 0.0–0.9)
dsDNA Ab: 5 IU/mL (ref 0–9)

## 2017-03-08 LAB — URIC ACID: URIC ACID: 4.6 mg/dL (ref 3.7–8.6)

## 2017-03-08 LAB — LYME, IGM, EARLY TEST/REFLEX: LYME DISEASE AB, QUANT, IGM: <0.8 index (ref 0.00–0.79)

## 2017-03-08 LAB — 25-HYDROXY VITAMIN D LCMS D2+D3
25-Hydroxy, Vitamin D-2: 1.2 ng/mL
25-Hydroxy, Vitamin D-3: 12 ng/mL
25-Hydroxy, Vitamin D: 13 ng/mL — ABNORMAL LOW

## 2017-03-08 LAB — SEDIMENTATION RATE: SED RATE: 2 mm/h (ref 0–30)

## 2017-03-08 LAB — C-REACTIVE PROTEIN: CRP: 0.3 mg/L (ref 0.0–4.9)

## 2017-03-08 LAB — VITAMIN B12: VITAMIN B 12: 290 pg/mL (ref 232–1245)

## 2017-03-20 ENCOUNTER — Telehealth: Payer: Self-pay

## 2017-03-20 ENCOUNTER — Other Ambulatory Visit: Payer: Self-pay | Admitting: Nurse Practitioner

## 2017-03-20 DIAGNOSIS — E559 Vitamin D deficiency, unspecified: Secondary | ICD-10-CM

## 2017-03-20 MED ORDER — VITAMIN D (ERGOCALCIFEROL) 1.25 MG (50000 UNIT) PO CAPS: ORAL_CAPSULE | ORAL | 0 refills | Status: DC

## 2017-03-20 NOTE — Telephone Encounter (Signed)
Phone call to patient to let him know that Vitamin D has been sent in to [facility].

## 2017-03-20 NOTE — Telephone Encounter (Signed)
Pt says a nurse called him to let him know that his vitamin d was sent to his pharmacy but pt says his pharmacy does not have a script for him. Pt says his pharmacy is the [facility] on [address]

## 2017-03-27 LAB — SPECIMEN STATUS REPORT

## 2017-03-27 LAB — TSH: TSH: 0.591 u[IU]/mL (ref 0.450–4.500)

## 2017-06-19 ENCOUNTER — Inpatient Hospital Stay
Admission: EM | Admit: 2017-06-19 | Discharge: 2017-06-22 | DRG: 378 | Disposition: A | Discharge status: Home / Self Care | Payer: Medicare Other | Attending: Internal Medicine | Admitting: Internal Medicine

## 2017-06-19 ENCOUNTER — Encounter: Payer: Self-pay | Admitting: Emergency Medicine

## 2017-06-19 DIAGNOSIS — Z886 Allergy status to analgesic agent status: Secondary | ICD-10-CM

## 2017-06-19 DIAGNOSIS — K922 Gastrointestinal hemorrhage, unspecified: Secondary | ICD-10-CM | POA: Diagnosis present

## 2017-06-19 DIAGNOSIS — Z8673 Personal history of transient ischemic attack (TIA), and cerebral infarction without residual deficits: Secondary | ICD-10-CM

## 2017-06-19 DIAGNOSIS — K253 Acute gastric ulcer without hemorrhage or perforation: Secondary | ICD-10-CM

## 2017-06-19 DIAGNOSIS — Z87891 Personal history of nicotine dependence: Secondary | ICD-10-CM

## 2017-06-19 DIAGNOSIS — M109 Gout, unspecified: Secondary | ICD-10-CM | POA: Diagnosis present

## 2017-06-19 DIAGNOSIS — F1020 Alcohol dependence, uncomplicated: Secondary | ICD-10-CM | POA: Diagnosis present

## 2017-06-19 DIAGNOSIS — G43709 Chronic migraine without aura, not intractable, without status migrainosus: Secondary | ICD-10-CM | POA: Diagnosis present

## 2017-06-19 DIAGNOSIS — G8929 Other chronic pain: Secondary | ICD-10-CM | POA: Diagnosis present

## 2017-06-19 DIAGNOSIS — E876 Hypokalemia: Secondary | ICD-10-CM | POA: Diagnosis present

## 2017-06-19 DIAGNOSIS — Z79899 Other long term (current) drug therapy: Secondary | ICD-10-CM | POA: Diagnosis not present

## 2017-06-19 DIAGNOSIS — K259 Gastric ulcer, unspecified as acute or chronic, without hemorrhage or perforation: Secondary | ICD-10-CM | POA: Diagnosis present

## 2017-06-19 DIAGNOSIS — Z7982 Long term (current) use of aspirin: Secondary | ICD-10-CM | POA: Diagnosis not present

## 2017-06-19 DIAGNOSIS — E7800 Pure hypercholesterolemia, unspecified: Secondary | ICD-10-CM | POA: Diagnosis present

## 2017-06-19 DIAGNOSIS — K264 Chronic or unspecified duodenal ulcer with hemorrhage: Secondary | ICD-10-CM | POA: Diagnosis present

## 2017-06-19 DIAGNOSIS — Z23 Encounter for immunization: Secondary | ICD-10-CM

## 2017-06-19 DIAGNOSIS — Z888 Allergy status to other drugs, medicaments and biological substances status: Secondary | ICD-10-CM

## 2017-06-19 DIAGNOSIS — D62 Acute posthemorrhagic anemia: Secondary | ICD-10-CM | POA: Diagnosis present

## 2017-06-19 DIAGNOSIS — M5030 Other cervical disc degeneration, unspecified cervical region: Secondary | ICD-10-CM | POA: Diagnosis present

## 2017-06-19 DIAGNOSIS — K269 Duodenal ulcer, unspecified as acute or chronic, without hemorrhage or perforation: Secondary | ICD-10-CM

## 2017-06-19 DIAGNOSIS — N179 Acute kidney failure, unspecified: Secondary | ICD-10-CM | POA: Diagnosis present

## 2017-06-19 DIAGNOSIS — I10 Essential (primary) hypertension: Secondary | ICD-10-CM | POA: Diagnosis present

## 2017-06-19 DIAGNOSIS — D649 Anemia, unspecified: Secondary | ICD-10-CM | POA: Diagnosis not present

## 2017-06-19 DIAGNOSIS — Z8711 Personal history of peptic ulcer disease: Secondary | ICD-10-CM

## 2017-06-19 DIAGNOSIS — F419 Anxiety disorder, unspecified: Secondary | ICD-10-CM | POA: Diagnosis present

## 2017-06-19 DIAGNOSIS — K921 Melena: Principal | ICD-10-CM

## 2017-06-19 LAB — COMPREHENSIVE METABOLIC PANEL
ALT: 18 U/L (ref 17–63)
AST: 27 U/L (ref 15–41)
Albumin: 3.6 g/dL (ref 3.5–5.0)
Alkaline Phosphatase: 28 U/L — ABNORMAL LOW (ref 38–126)
Anion gap: 13 (ref 5–15)
BUN: 55 mg/dL — AB (ref 6–20)
CALCIUM: 9.2 mg/dL (ref 8.9–10.3)
CHLORIDE: 105 mmol/L (ref 101–111)
CO2: 26 mmol/L (ref 22–32)
CREATININE: 1.53 mg/dL — AB (ref 0.61–1.24)
GFR, EST AFRICAN AMERICAN: 51 mL/min — AB (ref >60)
GFR, EST NON AFRICAN AMERICAN: 44 mL/min — AB (ref >60)
GLUCOSE: 99 mg/dL (ref 65–99)
Potassium: 2.9 mmol/L — ABNORMAL LOW (ref 3.5–5.1)
Sodium: 144 mmol/L (ref 135–145)
Total Bilirubin: 0.5 mg/dL (ref 0.3–1.2)
Total Protein: 6.1 g/dL — ABNORMAL LOW (ref 6.5–8.1)

## 2017-06-19 LAB — CBC
HCT: 21.4 % — ABNORMAL LOW (ref 40.0–52.0)
Hemoglobin: 7.2 g/dL — ABNORMAL LOW (ref 13.0–18.0)
MCH: 29.7 pg (ref 26.0–34.0)
MCHC: 33.7 g/dL (ref 32.0–36.0)
MCV: 88.1 fL (ref 80.0–100.0)
PLATELETS: 202 10*3/uL (ref 150–440)
RBC: 2.43 MIL/uL — AB (ref 4.40–5.90)
RDW: 14.1 % (ref 11.5–14.5)
WBC: 5.9 10*3/uL (ref 3.8–10.6)

## 2017-06-19 LAB — PROTIME-INR
INR: 1.06
PROTHROMBIN TIME: 13.7 s (ref 11.4–15.2)

## 2017-06-19 LAB — ACETAMINOPHEN LEVEL: Acetaminophen (Tylenol), Serum: 17 ug/mL (ref 10–30)

## 2017-06-19 LAB — PREPARE RBC (CROSSMATCH)

## 2017-06-19 LAB — APTT: aPTT: 27 seconds (ref 24–36)

## 2017-06-19 LAB — ABO/RH: ABO/RH(D): A POS

## 2017-06-19 LAB — MAGNESIUM: MAGNESIUM: 1.6 mg/dL — AB (ref 1.7–2.4)

## 2017-06-19 LAB — SALICYLATE LEVEL: Salicylate Lvl: 12.6 mg/dL (ref 2.8–30.0)

## 2017-06-19 MED ORDER — BISACODYL 5 MG PO TBEC: 5.0000 mg | DELAYED_RELEASE_TABLET | Freq: Every day | ORAL | Status: DC | PRN

## 2017-06-19 MED ORDER — HYDROCODONE-ACETAMINOPHEN 5-325 MG PO TABS
1.0000 | ORAL_TABLET | ORAL | Status: DC | PRN
  Administered 2017-06-19 – 2017-06-20 (×4): 2 via ORAL
  Administered 2017-06-21 (×2): 1 via ORAL
  Administered 2017-06-21 – 2017-06-22 (×3): 2 via ORAL
  Administered 2017-06-22: 1 via ORAL
  Filled 2017-06-19 (×2): qty 2
  Filled 2017-06-19: qty 1
  Filled 2017-06-19: qty 2
  Filled 2017-06-19: qty 1
  Filled 2017-06-19 (×6): qty 2

## 2017-06-19 MED ORDER — SODIUM CHLORIDE 0.9 % IV SOLN: 1000.0000 mL | Freq: Once | INTRAVENOUS | Status: DC

## 2017-06-19 MED ORDER — DIPHENHYDRAMINE HCL 50 MG/ML IJ SOLN
25.0000 mg | Freq: Once | INTRAMUSCULAR | Status: DC
  Filled 2017-06-19: qty 1

## 2017-06-19 MED ORDER — ATORVASTATIN CALCIUM 10 MG PO TABS
10.0000 mg | ORAL_TABLET | Freq: Every evening | ORAL | Status: DC
  Administered 2017-06-19 – 2017-06-21 (×3): 10 mg via ORAL
  Filled 2017-06-19 (×3): qty 1

## 2017-06-19 MED ORDER — PANTOPRAZOLE SODIUM 40 MG IV SOLR
80.0000 mg | Freq: Once | INTRAVENOUS | Status: AC
  Administered 2017-06-19: 80 mg via INTRAVENOUS
  Filled 2017-06-19: qty 80

## 2017-06-19 MED ORDER — ONDANSETRON HCL 4 MG PO TABS: 4.0000 mg | ORAL_TABLET | Freq: Four times a day (QID) | ORAL | Status: DC | PRN

## 2017-06-19 MED ORDER — SENNOSIDES-DOCUSATE SODIUM 8.6-50 MG PO TABS: 1.0000 | ORAL_TABLET | Freq: Every evening | ORAL | Status: DC | PRN

## 2017-06-19 MED ORDER — HYDRALAZINE HCL 50 MG PO TABS
100.0000 mg | ORAL_TABLET | Freq: Three times a day (TID) | ORAL | Status: DC
  Administered 2017-06-19 – 2017-06-22 (×9): 100 mg via ORAL
  Filled 2017-06-19 (×9): qty 2

## 2017-06-19 MED ORDER — VERAPAMIL HCL ER 180 MG PO TBCR
180.0000 mg | EXTENDED_RELEASE_TABLET | Freq: Every day | ORAL | Status: DC
  Administered 2017-06-19 – 2017-06-21 (×3): 180 mg via ORAL
  Filled 2017-06-19 (×4): qty 1

## 2017-06-19 MED ORDER — MORPHINE SULFATE (PF) 2 MG/ML IV SOLN
2.0000 mg | INTRAVENOUS | Status: DC | PRN
  Administered 2017-06-19: 2 mg via INTRAVENOUS
  Filled 2017-06-19: qty 1

## 2017-06-19 MED ORDER — CARVEDILOL 12.5 MG PO TABS
12.5000 mg | ORAL_TABLET | Freq: Two times a day (BID) | ORAL | Status: DC
  Administered 2017-06-19 – 2017-06-22 (×6): 12.5 mg via ORAL
  Filled 2017-06-19 (×6): qty 1

## 2017-06-19 MED ORDER — FENTANYL CITRATE (PF) 100 MCG/2ML IJ SOLN
50.0000 ug | Freq: Once | INTRAMUSCULAR | Status: AC
  Administered 2017-06-19: 50 ug via INTRAVENOUS
  Filled 2017-06-19: qty 2

## 2017-06-19 MED ORDER — POTASSIUM CHLORIDE CRYS ER 20 MEQ PO TBCR
40.0000 meq | EXTENDED_RELEASE_TABLET | ORAL | Status: AC
  Administered 2017-06-19 (×2): 40 meq via ORAL
  Filled 2017-06-19 (×2): qty 2

## 2017-06-19 MED ORDER — CYCLOBENZAPRINE HCL 10 MG PO TABS
5.0000 mg | ORAL_TABLET | Freq: Three times a day (TID) | ORAL | Status: DC | PRN
Start: 2017-06-19 — End: 2017-06-22
  Administered 2017-06-19 – 2017-06-21 (×3): 5 mg via ORAL
  Filled 2017-06-19 (×4): qty 1

## 2017-06-19 MED ORDER — POTASSIUM CHLORIDE CRYS ER 20 MEQ PO TBCR
20.0000 meq | EXTENDED_RELEASE_TABLET | Freq: Every day | ORAL | Status: DC
  Administered 2017-06-20 – 2017-06-22 (×3): 20 meq via ORAL
  Filled 2017-06-19 (×3): qty 1

## 2017-06-19 MED ORDER — MORPHINE SULFATE (PF) 4 MG/ML IV SOLN
4.0000 mg | INTRAVENOUS | Status: DC | PRN
  Administered 2017-06-19 – 2017-06-20 (×5): 4 mg via INTRAVENOUS
  Filled 2017-06-19 (×6): qty 1

## 2017-06-19 MED ORDER — POTASSIUM CHLORIDE IN NACL 40-0.9 MEQ/L-% IV SOLN
INTRAVENOUS | Status: DC
  Administered 2017-06-19 – 2017-06-20 (×2): 100 mL/h via INTRAVENOUS
  Filled 2017-06-19 (×5): qty 1000

## 2017-06-19 MED ORDER — PROCHLORPERAZINE EDISYLATE 5 MG/ML IJ SOLN
5.0000 mg | Freq: Once | INTRAMUSCULAR | Status: DC
  Filled 2017-06-19: qty 2

## 2017-06-19 MED ORDER — DOXAZOSIN MESYLATE 4 MG PO TABS
8.0000 mg | ORAL_TABLET | Freq: Every day | ORAL | Status: DC
  Administered 2017-06-19 – 2017-06-21 (×3): 8 mg via ORAL
  Filled 2017-06-19 (×4): qty 2

## 2017-06-19 MED ORDER — CLONIDINE HCL 0.1 MG PO TABS
0.1000 mg | ORAL_TABLET | Freq: Two times a day (BID) | ORAL | Status: DC
  Administered 2017-06-19 – 2017-06-22 (×6): 0.1 mg via ORAL
  Filled 2017-06-19 (×6): qty 1

## 2017-06-19 MED ORDER — SODIUM CHLORIDE 0.9 % IV SOLN
Freq: Once | INTRAVENOUS | Status: AC
  Administered 2017-06-19: 16:00:00 via INTRAVENOUS

## 2017-06-19 MED ORDER — ONDANSETRON HCL 4 MG/2ML IJ SOLN: 4.0000 mg | Freq: Four times a day (QID) | INTRAMUSCULAR | Status: DC | PRN

## 2017-06-19 MED ORDER — ALBUTEROL SULFATE (2.5 MG/3ML) 0.083% IN NEBU: 2.5000 mg | INHALATION_SOLUTION | RESPIRATORY_TRACT | Status: DC | PRN

## 2017-06-19 MED ORDER — ACETAMINOPHEN 650 MG RE SUPP: 650.0000 mg | Freq: Four times a day (QID) | RECTAL | Status: DC | PRN

## 2017-06-19 MED ORDER — ACETAMINOPHEN 325 MG PO TABS
650.0000 mg | ORAL_TABLET | Freq: Four times a day (QID) | ORAL | Status: DC | PRN
  Administered 2017-06-21 – 2017-06-22 (×2): 650 mg via ORAL
  Filled 2017-06-19 (×3): qty 2

## 2017-06-19 MED ORDER — PNEUMOCOCCAL VAC POLYVALENT 25 MCG/0.5ML IJ INJ
0.5000 mL | INJECTION | INTRAMUSCULAR | Status: AC
  Administered 2017-06-20: 0.5 mL via INTRAMUSCULAR
  Filled 2017-06-19: qty 0.5

## 2017-06-19 MED ORDER — PANTOPRAZOLE SODIUM 40 MG IV SOLR
40.0000 mg | Freq: Two times a day (BID) | INTRAVENOUS | Status: DC
  Administered 2017-06-19 – 2017-06-20 (×2): 40 mg via INTRAVENOUS
  Filled 2017-06-19 (×2): qty 40

## 2017-06-19 NOTE — H&P (Signed)
[facility] NAME: Carl [name]    MR#:  [id number]  DATE OF BIRTH:  November 02, 1945  DATE OF ADMISSION:  06/19/2017  PRIMARY CARE PHYSICIAN: Hortencia Pilar, MD   REQUESTING/REFERRING PHYSICIAN: Darel Hong, MD  CHIEF COMPLAINT:   Chief Complaint  Patient presents with  . Blood In Stools   Melena for 3-4 days. HISTORY OF PRESENT ILLNESS:  Carl [name]  is a 71 y.o. male with a known history of multiple medical problems including duodenal ulcer. The patient has been taking Excedrin every 1-2 hours 5-6 days due to headache and neck pain for the past 2 weeks. He complains of mild cramping abdominal pain, generalized weakness and melena for the past the 3-4 days. He was found hemoglobin 7.2 Previous hemoglobin was 10.6.   PAST MEDICAL HISTORY:   Past Medical History:  Diagnosis Date  . Alcoholism ([place])    still drinking wine as of Feb 2013  . Anxiety and depression   . Cataract   . Cervical spondylosis without myelopathy   . Cervicalgia of occipito-atlanto-axial region   . Chronic pain disorder   . Degeneration of cervical intervertebral disc   . Duodenal ulcer 2011  . Facet arthropathy, cervical   . Fuchs' corneal dystrophy   . Gastric ulcer 2011   egd at [place] Aug 2011.  . GI bleed   . Gout   . Hypertension   . Impaired fasting glucose   . 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 LVH (left ventricular hypertrophy)   . Mitral insufficiency   . Neck pain   . Neuralgia and neuritis   . Nocturia   . Occipital neuralgia   . Osteoarthritis    neck  . Osteoarthritis   . Pure hypercholesterolemia   . Radiculitis   . Stroke ([place])   . Tricuspid insufficiency     PAST SURGICAL HISTORY:   Past Surgical History:  Procedure Laterality Date  . CYSTOGRAM  03/08    SOCIAL HISTORY:   Social History  Substance Use Topics  . Smoking status: Former Smoker    Quit date:  09/02/1978  . Smokeless tobacco: Never Used  . Alcohol use Yes    FAMILY HISTORY:   Family History  Problem Relation Age of Onset  . Fibromyalgia Mother   . Diabetes Neg Hx     DRUG ALLERGIES:   Allergies  Allergen Reactions  . Ace Inhibitors Other (See Comments)    unknown  . Hydrochlorothiazide Other (See Comments)    unknown  . Lisinopril Other (See Comments)    unknown  . Nsaids Other (See Comments)    Gi bleed  . Ramipril Other (See Comments)    unknown    REVIEW OF SYSTEMS:   Review of Systems  Constitutional: Positive for malaise/fatigue. Negative for chills and fever.  HENT: Negative for sore throat.   Eyes: Negative for blurred vision and double vision.  Respiratory: Negative for cough, hemoptysis, shortness of breath, wheezing and stridor.   Cardiovascular: Negative for chest pain, palpitations, orthopnea and leg swelling.  Gastrointestinal: Positive for melena. Negative for abdominal pain, blood in stool, diarrhea, nausea and vomiting.  Genitourinary: Negative for dysuria, flank pain and hematuria.  Musculoskeletal: Positive for neck pain. Negative for back pain and joint pain.  Skin: Negative for rash.  Neurological: Positive for weakness. Negative for dizziness, sensory change, focal weakness, seizures, loss of consciousness and headaches.  Endo/Heme/Allergies:  Negative for polydipsia.  Psychiatric/Behavioral: Negative for depression. The patient is not nervous/anxious.     MEDICATIONS AT HOME:   Prior to Admission medications   Medication Sig Start Date End Date Taking? Authorizing Provider  atorvastatin (LIPITOR) 10 MG tablet Take 10 mg by mouth every evening. 11/06/16 06/19/17 Yes [provider]  carvedilol (COREG) 12.5 MG tablet Take 12.5 mg by mouth 2 (two) times daily.  11/06/16  Yes [provider]  cloNIDine (CATAPRES) 0.1 MG tablet Take 0.1 mg by mouth 2 (two) times daily.   Yes [provider]  hydrALAZINE (APRESOLINE)  100 MG tablet Take 100 mg by mouth 3 (three) times daily.   Yes [provider]  losartan (COZAAR) 100 MG tablet Take 100 mg by mouth daily.    Yes [provider]  Vitamin D, Ergocalciferol, (DRISDOL) 50000 units CAPS capsule Take 1 capsule (50,000 Units total) by mouth weekly for 12 weeks. 03/20/17  Yes Vevelyn Francois, NP  acetaminophen (TYLENOL) 650 MG CR tablet Take 650 mg by mouth every 2 (two) hours.    [provider]  aspirin-acetaminophen-caffeine (EXCEDRIN MIGRAINE) [phone] MG tablet Take by mouth daily as needed for headache. Patient states he takes at least every 2 days    [provider]  doxazosin (CARDURA) 8 MG tablet Take 8 mg by mouth at bedtime.      [provider]  potassium chloride SA (K-DUR,KLOR-CON) 20 MEQ tablet Take 1 tablet by mouth daily. 05/27/17 05/27/18  [provider]  verapamil (CALAN-SR) 180 MG CR tablet Take 180 mg by mouth at bedtime.    [provider]      VITAL SIGNS:  Blood pressure (!) 141/78, pulse 89, temperature 97.9 F (36.6 C), temperature source Oral, resp. rate 18, height 5\' 4"  (1.626 m), weight 188 lb (85.3 kg), SpO2 98 %.  PHYSICAL EXAMINATION:  Physical Exam  GENERAL:  71 y.o.-year-old patient lying in the bed with no acute distress. Obesity. EYES: Pupils equal, round, reactive to light and accommodation. No scleral icterus. Extraocular muscles intact.  HEENT: Head atraumatic, normocephalic. Oropharynx and nasopharynx clear.  NECK:  Supple, no jugular venous distention. No thyroid enlargement, no tenderness.  LUNGS: Normal breath sounds bilaterally, no wheezing, rales,rhonchi or crepitation. No use of accessory muscles of respiration.  CARDIOVASCULAR: S1, S2 normal. No murmurs, rubs, or gallops.  ABDOMEN: Soft, nontender, nondistended. Bowel sounds present. No organomegaly or mass.  EXTREMITIES: No pedal edema, cyanosis, or clubbing.  NEUROLOGIC: Cranial nerves II through XII  are intact. Muscle strength 5/5 in all extremities. Sensation intact. Gait not checked.  PSYCHIATRIC: The patient is alert and oriented x 3.  SKIN: No obvious rash, lesion, or ulcer.   LABORATORY PANEL:   CBC  Recent Labs Lab 06/19/17 1424  WBC 5.9  HGB 7.2*  HCT 21.4*  PLT 202   ------------------------------------------------------------------------------------------------------------------  Chemistries   Recent Labs Lab 06/19/17 1424  NA 144  K 2.9*  CL 105  CO2 26  GLUCOSE 99  BUN 55*  CREATININE 1.53*  CALCIUM 9.2  AST 27  ALT 18  ALKPHOS 28*  BILITOT 0.5   ------------------------------------------------------------------------------------------------------------------  Cardiac Enzymes No results for input(s): TROPONINI in the last 168 hours. ------------------------------------------------------------------------------------------------------------------  RADIOLOGY:  No results found.    IMPRESSION AND PLAN:   GI bleeding, possible due to PUD with history of duodenal ulcer. The patient will be admitted to medical floor. Keep nothing by mouth, IV fluid support, Protonix IV, GI consult.  Anemia  due to acute blood loss. The patient will get PRBC transfusion. Follow-up hemoglobin after blood transfusion.  Hypokalemia. Give potassium supplement, follow-up potassium and magnesium level.   Acute renal failure. Hold losartan, start normal saline and follow-up BMP.  Hypertension. Continue home hypertension medication.  All the records are reviewed and case discussed with ED provider. Management plans discussed with the patient, family and they are in agreement.  CODE STATUS: full code  TOTAL TIME TAKING CARE OF THIS PATIENT:  58 minutes.    Demetrios Loll M.D on 06/19/2017 at 4:08 PM  Between 7am to 6pm - Pager - [phone]  After 6pm go to [email or web address] - Proofreader  [facility]  Office  [phone]  CC:  Primary care physician; Hortencia Pilar, MD   Note: This dictation was prepared with Dragon dictation along with smaller phrase technology. Any transcriptional errors that result from this process are unin

## 2017-06-19 NOTE — ED Triage Notes (Signed)
Pt reports that he has had a headache for the last week, he reports that he has been taking tylenol and Ibuprofen for the last 4 or 5 days he has been taking tylenol and Ibuprofen every hour. He states that when he had a bowel movement it was dark blood. He reports that he is weak also. Pt is pale at time of triage.

## 2017-06-19 NOTE — ED Provider Notes (Signed)
[facility] Emergency Department Provider Note  ____________________________________________   First MD Initiated Contact with Patient 06/19/17 1449     (approximate)  I have reviewed the triage vital signs and the nursing notes.   HISTORY  Chief Complaint Blood In Stools   HPI Carl [name] is a 71 y.o. male who self presents to the emergency department with dark black stool. He has a long-standing history of headaches that have been particularly worse for the past 2 weeks or so and he has been taking over-the-counter Excedrin every 1-2 hours ever since. He has a long-standing history of gastric ulcers confirmed on endoscopy years ago and has not taken nonsteroidals until the last 2 weeks. He does have mild to moderate cramping lower abdominal discomfort that seems to be improved after defecating. Nothing in particular makes it worse. He denies chest pain or shortness of breath. He takes no blood thinning medications.   Past Medical History:  Diagnosis Date  . Alcoholism ([place])    still drinking wine as of Feb 2013  . Anxiety and depression   . Cataract   . Cervical spondylosis without myelopathy   . Cervicalgia of occipito-atlanto-axial region   . Chronic pain disorder   . Degeneration of cervical intervertebral disc   . Duodenal ulcer 2011  . Facet arthropathy, cervical   . Fuchs' corneal dystrophy   . Gastric ulcer 2011   egd at [place] Aug 2011.  . GI bleed   . Gout   . Hypertension   . Impaired fasting glucose   . 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 LVH (left ventricular hypertrophy)   . Mitral insufficiency   . Neck pain   . Neuralgia and neuritis   . Nocturia   . Occipital neuralgia   . Osteoarthritis    neck  . Osteoarthritis   . Pure hypercholesterolemia   . Radiculitis   . Stroke ([place])   . Tricuspid insufficiency     Patient Active Problem List   Diagnosis Date Noted  .  Left facial pain (secondary) 03/04/2017  . Occipital neuralgia of left side (tertiary) 03/04/2017  . GI bleed 03/03/2017  . History of stroke 03/03/2017  . Right sided weakness 03/03/2017  . Slurred speech 03/03/2017  . Word finding difficulty 03/03/2017  . Opiate use 03/03/2017  . Chronic headaches 03/03/2017  . Chronic pain of both knees 03/03/2017  . 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 Cervical spondylosis without myelopathy 05/16/2015  . Cervicalgia of occipito-atlanto-axial region 02/09/2015  . 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 Chronic pain disorder 09/09/2013  . Neuralgia, neuritis, and radiculitis, unspecified 09/09/2013  . Cognitive disorder 06/23/2013  . Depression, major, single episode, moderate ([place]) 06/23/2013  . Anxiety and depression 05/05/2013  . Facet arthropathy, cervical 05/05/2013  . Neck pain (primary) (left 05/05/2013  . Nocturia 04/22/2013  . LVH (left ventricular hypertrophy) 02/19/2013  . Mitral insufficiency 02/19/2013  . Pure hypercholesterolemia 02/19/2013  . Tricuspid insufficiency 02/19/2013  . Cataracts, bilateral 08/21/2012  . Fuchs' corneal dystrophy 08/21/2012  . Occipital neuralgia 02/20/2012  . Alcohol dependence in remission ([place]) 03/14/2011  . DDD (degenerative disc disease), cervical 03/14/2011  . Persistent insomnia 03/14/2011  . ANXIETY, SITUATIONAL 06/15/2010  . Osteoarthrosis and allied disorders 06/15/2010  . CELLULITIS, HAND, LEFT 05/03/2010  . CONTACT DERMATITIS&OTHER ECZEMA DUE TO PLANTS 01/12/2010  .  NECK SPRAIN AND STRAIN 01/03/2010  . LUNG NODULE 07/17/2009  . CHEST PAIN 07/13/2009  . UNSPECIFIED PROSTATITIS 06/15/2009  . ALCOHOL ABUSE 05/15/2009  . ALCOHOL WITHDRAWAL 04/24/2009  . IMPAIRED FASTING GLUCOSE 11/04/2007  . GOUT 01/02/2007  . Essential hypertension 01/02/2007    Past  Surgical History:  Procedure Laterality Date  . CYSTOGRAM  03/08    Prior to Admission medications   Medication Sig Start Date End Date Taking? Authorizing Provider  acetaminophen (TYLENOL) 650 MG CR tablet Take 650 mg by mouth every 2 (two) hours.    [provider]  aspirin-acetaminophen-caffeine (EXCEDRIN MIGRAINE) [phone] MG tablet Take by mouth daily as needed for headache. Patient states he takes at least every 2 days    [provider]  atorvastatin (LIPITOR) 10 MG tablet Take 10 mg by mouth every evening. 11/06/16 04/27/17  [provider]  carvedilol (COREG) 12.5 MG tablet Take 12.5 mg by mouth 2 (two) times daily.  11/06/16   [provider]  cloNIDine (CATAPRES) 0.1 MG tablet Take 0.1 mg by mouth 2 (two) times daily.    [provider]  colchicine 0.6 MG tablet Take 1 tablet (0.6 mg total) by mouth daily. Patient not taking: Reported on 12/06/2016 10/21/11 10/20/12  Donzetta Starch, NP  doxazosin (CARDURA) 8 MG tablet Take 8 mg by mouth at bedtime.      [provider]  hydrALAZINE (APRESOLINE) 100 MG tablet Take 100 mg by mouth 3 (three) times daily.    [provider]  losartan (COZAAR) 100 MG tablet Take 100 mg by mouth daily.     [provider]  pantoprazole (PROTONIX) 40 MG tablet Take 1 tablet (40 mg total) by mouth daily at 12 noon. Patient not taking: Reported on 12/06/2016 10/21/11 10/20/12  Donzetta Starch, NP  Vitamin D, Ergocalciferol, (DRISDOL) 50000 units CAPS capsule Take 1 capsule (50,000 Units total) by mouth weekly for 12 weeks. 03/20/17   Vevelyn Francois, NP    Allergies Ace inhibitors; Hydrochlorothiazide; Lisinopril; Nsaids; and Ramipril  Family History  Problem Relation Age of Onset  . Fibromyalgia Mother   . Diabetes Neg Hx     Social History Social History  Substance Use Topics  . Smoking status: Former Smoker    Quit date: 09/02/1978  . Smokeless tobacco: Never Used  . Alcohol use Yes     Review of Systems Constitutional: No fever/chills Eyes: No visual changes. ENT: No sore throat. Cardiovascular: Denies chest pain. Respiratory: Denies shortness of breath. Gastrointestinal: positive for abdominal pain.  positive for nausea, no vomiting.  No diarrhea.  No constipation. Genitourinary: Negative for dysuria. Musculoskeletal: Negative for back pain. Skin: Negative for rash. Neurological: Negative for headaches, focal weakness or numbness.   ____________________________________________   PHYSICAL EXAM:  VITAL SIGNS: ED Triage Vitals  Enc Vitals Group     BP 06/19/17 1412 100/78     Pulse Rate 06/19/17 1412 (!) 108     Resp 06/19/17 1412 20     Temp 06/19/17 1412 97.9 F (36.6 C)     Temp Source 06/19/17 1412 Oral     SpO2 06/19/17 1412 99 %     Weight 06/19/17 1416 188 lb (85.3 kg)     Height 06/19/17 1416 5\' 4"  (1.626 m)     Head Circumference --      Peak Flow --      Pain Score 06/19/17 1415 6     Pain Loc --  Pain Edu? --      Excl. in [place]? 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 conjunctival pallor Head: Atraumatic. Nose: No congestion/rhinnorhea. Mouth/Throat: No trismus Neck: No stridor.   Cardiovascular: tachycardic rate, regular rhythm. Grossly normal heart sounds.  Good peripheral circulation. Respiratory: Normal respiratory effort.  No retractions. Lungs CTAB and moving good air Gastrointestinal: soft nondistended mild lower diffuse tenderness with no rebound or guarding no focality no peritonitis Guaiac positive control positive frank melena Musculoskeletal: No lower extremity edema   Neurologic:  Normal speech and language. No gross focal neurologic deficits are appreciated. Skin:  Skin is warm, dry and intact. No rash noted. Psychiatric: Mood and affect are normal. Speech and behavior are  normal.    ____________________________________________   DIFFERENTIAL includes but not limited to  upper GI bleed, esophageal varices, lower GI bleed, symptomatic anemia, chronic headache ____________________________________________   LABS (all labs ordered are listed, but only abnormal results are displayed)  Labs Reviewed  COMPREHENSIVE METABOLIC PANEL - Abnormal; Notable for the following:       Result Value   Potassium 2.9 (*)    BUN 55 (*)    Creatinine, Ser 1.53 (*)    Total Protein 6.1 (*)    Alkaline Phosphatase 28 (*)    GFR calc non Af Amer 44 (*)    GFR calc Af Amer 51 (*)    All other components within normal limits  CBC - Abnormal; Notable for the following:    RBC 2.43 (*)    Hemoglobin 7.2 (*)    HCT 21.4 (*)    All other components within normal limits  APTT  PROTIME-INR  ACETAMINOPHEN LEVEL  SALICYLATE LEVEL  POC OCCULT BLOOD, ED  TYPE AND SCREEN    blood work reviewed and interpreted by me shows acute kidney injury as well as a significant drop in his hemoglobin __________________________________________  EKG   ____________________________________________  RADIOLOGY   ____________________________________________   PROCEDURES  Procedure(s) performed: no  Procedures  Critical Care performed:yes  CRITICAL CARE Performed by: Darel Hong   Total critical care time: 35 minutes  Critical care time was exclusive of separately billable procedures and treating other patients.  Critical care was necessary to treat or prevent imminent or life-threatening deterioration.  Critical care was time spent personally by me on the following activities: development of treatment plan with patient and/or surrogate as well as nursing, discussions with consultants, evaluation of patient's response to treatment, examination of patient, obtaining history from patient or surrogate, ordering and performing treatments and interventions, ordering and review  of laboratory studies, ordering and review of radiographic studies, pulse oximetry and re-evaluation of patient's condition.   Observation: no ____________________________________________   INITIAL IMPRESSION / ASSESSMENT AND PLAN / ED COURSE  Pertinent labs & imaging results that were available during my care of the patient were reviewed by me and considered in my medical decision making (see chart for details).  The patient is tachycardic with symptomatic anemia and guaiac positive frank melena after a two-week stent of abusing high doses of over-the-counter Excedrin with aspirin. He does have a known history of previous gastric ulcers and I'm concerned that he has another upper GI bleed. He has no history of cirrhosis so he only requires a PPI as well as a blood transfusion at this point. He will require inpatient admission for continued resuscitation and gastroenterology evaluation.  ____________________________________________   FINAL CLINICAL IMPRESSION(S) / ED DIAGNOSES  Final diagnoses:  Melena  Symptomatic anemia      NEW MEDICATIONS STARTED DURING THIS VISIT:  New Prescriptions   No medications on file     Note:  This document was prepared using Dragon voice recognition software and may include unintentional dictation errors.     Darel Hong, MD 06/19/17 1540

## 2017-06-19 NOTE — ED Notes (Signed)
Pt transported to room 229 

## 2017-06-20 ENCOUNTER — Encounter: Payer: Self-pay | Admitting: *Deleted

## 2017-06-20 ENCOUNTER — Inpatient Hospital Stay: Payer: Medicare Other | Admitting: Certified Registered Nurse Anesthetist

## 2017-06-20 ENCOUNTER — Encounter
Admission: EM | Disposition: A | Discharge status: Home / Self Care | Payer: Self-pay | Source: Home / Self Care | Attending: Internal Medicine

## 2017-06-20 DIAGNOSIS — K269 Duodenal ulcer, unspecified as acute or chronic, without hemorrhage or perforation: Secondary | ICD-10-CM

## 2017-06-20 DIAGNOSIS — K921 Melena: Principal | ICD-10-CM

## 2017-06-20 DIAGNOSIS — K253 Acute gastric ulcer without hemorrhage or perforation: Secondary | ICD-10-CM

## 2017-06-20 HISTORY — PX: ESOPHAGOGASTRODUODENOSCOPY (EGD) WITH PROPOFOL: SHX5813

## 2017-06-20 LAB — CBC
HCT: 22.9 % — ABNORMAL LOW (ref 40.0–52.0)
Hemoglobin: 7.7 g/dL — ABNORMAL LOW (ref 13.0–18.0)
MCH: 29.1 pg (ref 26.0–34.0)
MCHC: 33.7 g/dL (ref 32.0–36.0)
MCV: 86.3 fL (ref 80.0–100.0)
PLATELETS: 165 10*3/uL (ref 150–440)
RBC: 2.65 MIL/uL — AB (ref 4.40–5.90)
RDW: 15.4 % — AB (ref 11.5–14.5)
WBC: 5.4 10*3/uL (ref 3.8–10.6)

## 2017-06-20 LAB — BASIC METABOLIC PANEL
Anion gap: 5 (ref 5–15)
BUN: 41 mg/dL — AB (ref 6–20)
CHLORIDE: 116 mmol/L — AB (ref 101–111)
CO2: 27 mmol/L (ref 22–32)
CREATININE: 1.25 mg/dL — AB (ref 0.61–1.24)
Calcium: 8.2 mg/dL — ABNORMAL LOW (ref 8.9–10.3)
GFR calc Af Amer: >60 mL/min (ref >60)
GFR calc non Af Amer: 57 mL/min — ABNORMAL LOW (ref >60)
GLUCOSE: 121 mg/dL — AB (ref 65–99)
Potassium: 3.9 mmol/L (ref 3.5–5.1)
Sodium: 148 mmol/L — ABNORMAL HIGH (ref 135–145)

## 2017-06-20 SURGERY — ESOPHAGOGASTRODUODENOSCOPY (EGD) WITH PROPOFOL
Anesthesia: General

## 2017-06-20 MED ORDER — LIDOCAINE HCL (CARDIAC) 20 MG/ML IV SOLN
INTRAVENOUS | Status: DC | PRN
  Administered 2017-06-20: 100 mg via INTRAVENOUS

## 2017-06-20 MED ORDER — SODIUM CHLORIDE 0.9 % IV SOLN
INTRAVENOUS | Status: DC | PRN
  Administered 2017-06-20: 13:00:00 via INTRAVENOUS

## 2017-06-20 MED ORDER — FERROUS SULFATE 325 (65 FE) MG PO TABS
325.0000 mg | ORAL_TABLET | Freq: Two times a day (BID) | ORAL | Status: DC
  Administered 2017-06-20 – 2017-06-22 (×4): 325 mg via ORAL
  Filled 2017-06-20 (×4): qty 1

## 2017-06-20 MED ORDER — LIDOCAINE HCL (PF) 2 % IJ SOLN
INTRAMUSCULAR | Status: AC
Start: 2017-06-20 — End: ?
  Filled 2017-06-20: qty 10

## 2017-06-20 MED ORDER — PROPOFOL 10 MG/ML IV BOLUS
INTRAVENOUS | Status: DC | PRN
  Administered 2017-06-20: 50 mg via INTRAVENOUS

## 2017-06-20 MED ORDER — PROPOFOL 500 MG/50ML IV EMUL
INTRAVENOUS | Status: AC
  Filled 2017-06-20: qty 50

## 2017-06-20 MED ORDER — PROPOFOL 500 MG/50ML IV EMUL
INTRAVENOUS | Status: DC | PRN
  Administered 2017-06-20: 150 ug/kg/min via INTRAVENOUS

## 2017-06-20 MED ORDER — PANTOPRAZOLE SODIUM 40 MG PO TBEC
40.0000 mg | DELAYED_RELEASE_TABLET | Freq: Two times a day (BID) | ORAL | Status: DC
  Administered 2017-06-20 – 2017-06-21 (×3): 40 mg via ORAL
  Filled 2017-06-20 (×2): qty 1

## 2017-06-20 MED ORDER — GLYCOPYRROLATE 0.2 MG/ML IJ SOLN
INTRAMUSCULAR | Status: DC | PRN
  Administered 2017-06-20: 0.2 mg via INTRAVENOUS

## 2017-06-20 MED ORDER — SODIUM CHLORIDE 0.9 % IV SOLN
INTRAVENOUS | Status: DC
  Administered 2017-06-20: 11:00:00 via INTRAVENOUS

## 2017-06-20 MED ORDER — PROPOFOL 10 MG/ML IV BOLUS
INTRAVENOUS | Status: AC
  Filled 2017-06-20: qty 20

## 2017-06-20 NOTE — Op Note (Signed)
[facility] Gastroenterology Patient Name: Carl [name] Procedure Date: 06/20/2017 1:13 PM MRN: [id number] Account #: [id number] Date of Birth: 02-11-1946 Admit Type: Inpatient Age: 71 Room: [facility] ENDO ROOM 4 Gender: Male Note Status: Finalized Procedure:            Upper GI endoscopy Indications:          Melena Providers:            Lucilla Lame MD, MD Medicines:            Propofol per Anesthesia Complications:        No immediate complications. Procedure:            Pre-Anesthesia Assessment:                       - Prior to the procedure, a History and Physical was                        performed, and patient medications and allergies were                        reviewed. The patient's tolerance of previous                        anesthesia was also reviewed. The risks and benefits of                        the procedure and the sedation options and risks were                        discussed with the patient. All questions were                        answered, and informed consent was obtained. Prior                        Anticoagulants: The patient has taken no previous                        anticoagulant or antiplatelet agents. ASA Grade                        Assessment: II - A patient with mild systemic disease.                        After reviewing the risks and benefits, the patient was                        deemed in satisfactory condition to undergo the                        procedure.                       After obtaining informed consent, the endoscope was                        passed under direct vision. Throughout the procedure,                        the patient's blood pressure, pulse, and  oxygen                        saturations were monitored continuously. The Endoscope                        was introduced through the mouth, and advanced to the                        second part of duodenum. The upper GI endoscopy was                         accomplished without difficulty. The patient tolerated                        the procedure well. Findings:      The examined esophagus was normal.      Few non-bleeding cratered gastric ulcers with no stigmata of bleeding       were found in the gastric antrum.      Few non-bleeding superficial duodenal ulcers with no stigmata of       bleeding were found in the first portion of the duodenum. Impression:           - Normal esophagus.                       - Non-bleeding gastric ulcers with no stigmata of                        bleeding.                       - Multiple non-bleeding duodenal ulcers with no                        stigmata of bleeding.                       - No specimens collected. Recommendation:       - Return patient to hospital ward for ongoing care.                       - Use a proton pump inhibitor PO daily.                       - Avoid NSAID's Procedure Code(s):    --- Professional ---                       [phone], Esophagogastroduodenoscopy, flexible, transoral;                        diagnostic, including collection of specimen(s) by                        brushing or washing, when performed (separate procedure) Diagnosis Code(s):    --- Professional ---                       K92.1, Melena (includes Hematochezia)                       K25.9, Gastric ulcer, unspecified as acute or chronic,  without hemorrhage or perforation                       K26.9, Duodenal ulcer, unspecified as acute or chronic,                        without hemorrhage or perforation CPT copyright 2016 American Medical Association. All rights reserved. The codes documented in this report are preliminary and upon coder review may  be revised to meet current compliance requirements. Lucilla Lame MD, MD 06/20/2017 1:33:18 PM This report has been signed electronically. Number of Addenda: 0 Note Initiated On: 06/20/2017 1:13 PM      [name]

## 2017-06-20 NOTE — Anesthesia Preprocedure Evaluation (Signed)
Anesthesia Evaluation  Patient identified by MRN, date of birth, ID band Patient awake    Reviewed: Allergy & Precautions, H&P , NPO status , Patient's Chart, lab work & pertinent test results, reviewed documented beta blocker date and time   History of Anesthesia Complications Negative for: history of anesthetic complications  Airway Mallampati: II  TM Distance: >3 FB Neck ROM: full    Dental  (+) Dental Advidsory Given, Caps, Missing, Poor Dentition   Pulmonary neg pulmonary ROS, former smoker,           Cardiovascular Exercise Tolerance: Good hypertension, (-) angina(-) CAD, (-) Past MI, (-) Cardiac Stents and (-) CABG (-) dysrhythmias + Valvular Problems/Murmurs      Neuro/Psych  Headaches, neg Seizures PSYCHIATRIC DISORDERS  Neuromuscular disease CVA, Residual Symptoms    GI/Hepatic PUD, neg GERD  ,(+)     substance abuse  alcohol use,   Endo/Other  negative endocrine ROS  Renal/GU negative Renal ROS  negative genitourinary   Musculoskeletal   Abdominal   Peds  Hematology negative hematology ROS (+)   Anesthesia Other Findings Past Medical History: No date: Alcoholism ([place])     Comment:  still drinking wine as of Feb 2013 No date: Anxiety and depression No date: Cataract No date: Cervical spondylosis without myelopathy No date: Cervicalgia of occipito-atlanto-axial region No date: Chronic pain disorder No date: Degeneration of cervical intervertebral disc 2011: Duodenal ulcer No date: Facet arthropathy, cervical No date: Fuchs' corneal dystrophy 2011: Gastric ulcer     Comment:  egd at [place] Aug 2011. No date: GI bleed No date: Gout No date: Hypertension No date: Impaired fasting glucose No date: Intractable 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 status  migrainosus No date: LVH (left ventricular hypertrophy) No date: Mitral insufficiency No date: Neck pain No date: Neuralgia  and neuritis No date: Nocturia No date: Occipital neuralgia No date: Osteoarthritis     Comment:  neck No date: Osteoarthritis No date: Pure hypercholesterolemia No date: Radiculitis No date: Stroke ([place]) No date: Tricuspid insufficiency   Reproductive/Obstetrics negative OB ROS                             Anesthesia Physical Anesthesia Plan  ASA: III  Anesthesia Plan: General   Post-op Pain Management:    Induction: Intravenous  PONV Risk Score and Plan: 2 and Propofol infusion  Airway Management Planned: Nasal Cannula  Additional Equipment:   Intra-op Plan:   Post-operative Plan:   Informed Consent: I have reviewed the patients History and Physical, chart, labs and discussed the procedure including the risks, benefits and alternatives for the proposed anesthesia with the patient or authorized representative who has indicated his/her understanding and acceptance.   Dental Advisory Given  Plan Discussed with: Anesthesiologist, CRNA and Surgeon  Anesthesia Plan Comments:         Anesthesia Quick Evaluation

## 2017-06-20 NOTE — Progress Notes (Signed)
[facility] [place] NAME: Carl [name]    MR#:  [id number]  DATE OF BIRTH:  05/29/1946  SUBJECTIVE:  CHIEF COMPLAINT:   Chief Complaint  Patient presents with  . Blood In Stools     Received 2 unit PRBC, had EGD- showed ulcer in stomach and duodenum. Have c/o chronic neck -pain.  REVIEW OF SYSTEMS:  CONSTITUTIONAL: No fever, fatigue or weakness.  EYES: No blurred or double vision.  EARS, NOSE, AND THROAT: No tinnitus or ear pain.  RESPIRATORY: No cough, shortness of breath, wheezing or hemoptysis.  CARDIOVASCULAR: No chest pain, orthopnea, edema.  GASTROINTESTINAL: No nausea, vomiting, diarrhea or abdominal pain.  GENITOURINARY: No dysuria, hematuria.  ENDOCRINE: No polyuria, nocturia,  HEMATOLOGY: No anemia, easy bruising or bleeding SKIN: No rash or lesion. MUSCULOSKELETAL: No joint pain or arthritis.   NEUROLOGIC: No tingling, numbness, weakness.  PSYCHIATRY: No anxiety or depression.   ROS  DRUG ALLERGIES:   Allergies  Allergen Reactions  . Ace Inhibitors Other (See Comments)    unknown  . Hydrochlorothiazide Other (See Comments)    unknown  . Lisinopril Other (See Comments)    unknown  . Nsaids Other (See Comments)    Gi bleed  . Ramipril Other (See Comments)    unknown    VITALS:  Blood pressure 138/82, pulse 84, temperature 97.7 F (36.5 C), temperature source Tympanic, resp. rate 16, height 5\' 4"  (1.626 m), weight 85.3 kg (188 lb), SpO2 100 %.  PHYSICAL EXAMINATION:  GENERAL:  71 y.o.-year-old patient lying in the bed with no acute distress.  EYES: Pupils equal, round, reactive to light and accommodation. No scleral icterus. Extraocular muscles intact.  HEENT: Head atraumatic, normocephalic. Oropharynx and nasopharynx clear.  NECK:  Supple, no jugular venous distention. No thyroid enlargement, no tenderness.  LUNGS: Normal breath sounds bilaterally, no wheezing, rales,rhonchi or crepitation. No use of accessory  muscles of respiration.  CARDIOVASCULAR: S1, S2 normal. No murmurs, rubs, or gallops.  ABDOMEN: Soft, nontender, nondistended. Bowel sounds present. No organomegaly or mass.  EXTREMITIES: No pedal edema, cyanosis, or clubbing.  NEUROLOGIC: Cranial nerves II through XII are intact. Muscle strength 5/5 in all extremities. Sensation intact. Gait not checked.  PSYCHIATRIC: The patient is alert and oriented x 3.  SKIN: No obvious rash, lesion, or ulcer.   Physical Exam LABORATORY PANEL:   CBC  Recent Labs Lab 06/20/17 0302  WBC 5.4  HGB 7.7*  HCT 22.9*  PLT 165   ------------------------------------------------------------------------------------------------------------------  Chemistries   Recent Labs Lab 06/19/17 1424 06/20/17 0302  NA 144 148*  K 2.9* 3.9  CL 105 116*  CO2 26 27  GLUCOSE 99 121*  BUN 55* 41*  CREATININE 1.53* 1.25*  CALCIUM 9.2 8.2*  MG 1.6*  --   AST 27  --   ALT 18  --   ALKPHOS 28*  --   BILITOT 0.5  --    ------------------------------------------------------------------------------------------------------------------  Cardiac Enzymes No results for input(s): TROPONINI in the last 168 hours. ------------------------------------------------------------------------------------------------------------------  RADIOLOGY:  No results found.  ASSESSMENT AND PLAN:   Active Problems:   GIB (gastrointestinal bleeding)   Melena   Acute esophagogastric ulcer   Duodenum ulcer   * GI bleeding,   nothing by mouth, IV fluid support, Protonix IV, GI consult. EGD done- Non bleeding Gastric and duodenal ulcers.  * Anemia due to acute blood loss.  PRBC transfusion. Follow-up hemoglobin after blood transfusion. Stable   Oral iron  on  d/c.  * Hypokalemia. Give potassium supplement, follow-up potassium and magnesium level.   * Acute renal failure. Hold losartan, start normal saline and follow-up BMP.  * Hypertension. Continue home hypertension  medication.   All the records are reviewed and case discussed with Care Management/Social Workerr. Management plans discussed with the patient, family and they are in agreement.  CODE STATUS: Full.  TOTAL TIME TAKING CARE OF THIS PATIENT: 35 minutes.    POSSIBLE D/C IN 1-2 DAYS, DEPENDING ON CLINICAL CONDITION.   Vaughan Basta M.D on 06/20/2017   Between 7am to 6pm - Pager - [phone]  After 6pm go to [email or web address] - password [email or web address] [facility]  Office  [phone]  CC: Primary care physician; Hortencia Pilar, MD  Note: This dictation was prepared with Dragon dictation along with smaller phrase technology. Any transcriptional errors that result from this process are unintentional.

## 2017-06-20 NOTE — Transfer of Care (Signed)
Immediate Anesthesia Transfer of Care Note  Patient: Carl [name]  Procedure(s) Performed: ESOPHAGOGASTRODUODENOSCOPY (EGD) WITH PROPOFOL (N/A )  Patient Location: PACU  Anesthesia Type:General  Level of Consciousness: drowsy and patient cooperative  Airway & Oxygen Therapy: Patient Spontanous Breathing and Patient connected to nasal cannula oxygen  Post-op Assessment: Report given to RN and Post -op Vital signs reviewed and stable  Post vital signs: Reviewed and stable  Last Vitals:  Vitals:   06/20/17 1049 06/20/17 1335  BP: 132/73 108/66  Pulse: (!) 59 77  Resp: 20 16  Temp: 36.8 C 36.5 C  SpO2: 99% 100%    Last Pain:  Vitals:   06/20/17 1335  TempSrc: Tympanic  PainSc: Asleep      Patients Stated Pain Goal: 6 (09/81/19 1478)  Complications: No apparent anesthesia complications

## 2017-06-20 NOTE — Anesthesia Post-op Follow-up Note (Signed)
Anesthesia QCDR form completed.        

## 2017-06-20 NOTE — Consult Note (Signed)
Carl Lame, MD [facility]  [address] [place], [place] [address] Phone: [phone] Fax : [phone]  Consultation  Referring Provider:     Dr. Bridgett Larsson Primary Care Physician:  Hortencia Pilar, MD Primary Gastroenterologist:  unassigned      Reason for Consultation:     Blood in stool  Date of Admission:  06/19/2017 Date of Consultation:  06/20/2017         HPI:   Carl [name] is a 71 y.o. male who reports that he has had a history of duodenal ulcers and was taking Excedrin every one to 2 hours for the last week. He states that he has chronic headaches and neck pain for years. He states it happened after he had a stroke. The patient was found to have a hemoglobin of 7.2 a repeat hemoglobin of 7.7 this morning. The patient had a upper endoscopy in 2013 in [place] that did not show any ulcerations but his upper endoscopy in 2011 showed a duodenal ulcer. The patient denies any nausea or vomiting. He also denies any fevers or chill. The patient has a remote history of alcohol abuse.  Past Medical History:  Diagnosis Date  . Alcoholism ([place])    still drinking wine as of Feb 2013  . Anxiety and depression   . Cataract   . Cervical spondylosis without myelopathy   . Cervicalgia of occipito-atlanto-axial region   . Chronic pain disorder   . Degeneration of cervical intervertebral disc   . Duodenal ulcer 2011  . Facet arthropathy, cervical   . Fuchs' corneal dystrophy   . Gastric ulcer 2011   egd at [place] Aug 2011.  . GI bleed   . Gout   . Hypertension   . Impaired fasting glucose   . 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 LVH (left ventricular hypertrophy)   . Mitral insufficiency   . Neck pain   . Neuralgia and neuritis   . Nocturia   . Occipital neuralgia   . Osteoarthritis    neck  . Osteoarthritis   . Pure hypercholesterolemia   . Radiculitis   . Stroke ([place])   . Tricuspid insufficiency     Past  Surgical History:  Procedure Laterality Date  . CYSTOGRAM  03/08    Prior to Admission medications   Medication Sig Start Date End Date Taking? Authorizing Provider  atorvastatin (LIPITOR) 10 MG tablet Take 10 mg by mouth every evening. 11/06/16 06/19/17 Yes [provider]  carvedilol (COREG) 12.5 MG tablet Take 12.5 mg by mouth 2 (two) times daily.  11/06/16  Yes [provider]  cloNIDine (CATAPRES) 0.1 MG tablet Take 0.1 mg by mouth 2 (two) times daily.   Yes [provider]  hydrALAZINE (APRESOLINE) 100 MG tablet Take 100 mg by mouth 3 (three) times daily.   Yes [provider]  losartan (COZAAR) 100 MG tablet Take 100 mg by mouth daily.    Yes [provider]  Vitamin D, Ergocalciferol, (DRISDOL) 50000 units CAPS capsule Take 1 capsule (50,000 Units total) by mouth weekly for 12 weeks. 03/20/17  Yes Vevelyn Francois, NP  acetaminophen (TYLENOL) 650 MG CR tablet Take 650 mg by mouth every 2 (two) hours.    [provider]  aspirin-acetaminophen-caffeine (EXCEDRIN MIGRAINE) [phone] MG tablet Take by mouth daily as needed for headache. Patient states he takes at least every 2 days    [provider]  doxazosin (CARDURA)  8 MG tablet Take 8 mg by mouth at bedtime.      [provider]  potassium chloride SA (K-DUR,KLOR-CON) 20 MEQ tablet Take 1 tablet by mouth daily. 05/27/17 05/27/18  [provider]  verapamil (CALAN-SR) 180 MG CR tablet Take 180 mg by mouth at bedtime.    [provider]    Family History  Problem Relation Age of Onset  . Fibromyalgia Mother   . Diabetes Neg Hx      Social History  Substance Use Topics  . Smoking status: Former Smoker    Quit date: 09/02/1978  . Smokeless tobacco: Never Used  . Alcohol use No    Allergies as of 06/19/2017 - Review Complete 06/19/2017  Allergen Reaction Noted  . Ace inhibitors Other (See Comments) 12/06/2016  . Hydrochlorothiazide Other (See  Comments) 07/16/2005  . Lisinopril Other (See Comments) 12/06/2016  . Nsaids Other (See Comments) 12/06/2016  . Ramipril Other (See Comments)     Review of Systems:    All systems reviewed and negative except where noted in HPI.   Physical Exam:  Vital signs in last 24 hours: Temp:  [97.8 F (36.6 C)-98.5 F (36.9 C)] 97.8 F (36.6 C) (10/19 0443) Pulse Rate:  [57-108] 57 (10/19 0443) Resp:  [18-24] 19 (10/19 0443) BP: (100-180)/(66-96) 115/66 (10/19 0443) SpO2:  [96 %-100 %] 98 % (10/19 0443) Weight:  [188 lb (85.3 kg)] 188 lb (85.3 kg) (10/18 1416) Last BM Date: 06/19/17 General:   Pleasant, cooperative in NAD Head:  Normocephalic and atraumatic. Eyes:   No icterus.   Conjunctiva pink. PERRLA. Ears:  Normal auditory acuity. Neck:  Supple; no masses or thyroidomegaly Lungs: Respirations even and unlabored. Lungs clear to auscultation bilaterally.   No wheezes, crackles, or rhonchi.  Heart:  Regular rate and rhythm;  Without murmur, clicks, rubs or gallops Abdomen:  Soft, nondistended, nontender. Normal bowel sounds. No appreciable masses or hepatomegaly.  No rebound or guarding.  Rectal:  Not performed. Msk:  Symmetrical without gross deformities.    Extremities:  Without edema, cyanosis or clubbing. Neurologic:  Alert and oriented x3;  grossly normal neurologically. Skin:  Intact without significant lesions or rashes. Cervical Nodes:  No significant cervical adenopathy. Psych:  Alert and cooperative. Normal affect.  LAB RESULTS:  Recent Labs  06/19/17 1424 06/20/17 0302  WBC 5.9 5.4  HGB 7.2* 7.7*  HCT 21.4* 22.9*  PLT 202 165   BMET  Recent Labs  06/19/17 1424 06/20/17 0302  NA 144 148*  K 2.9* 3.9  CL 105 116*  CO2 26 27  GLUCOSE 99 121*  BUN 55* 41*  CREATININE 1.53* 1.25*  CALCIUM 9.2 8.2*   LFT  Recent Labs  06/19/17 1424  PROT 6.1*  ALBUMIN 3.6  AST 27  ALT 18  ALKPHOS 28*  BILITOT 0.5   PT/INR  Recent Labs  06/19/17 1440    LABPROT 13.7  INR 1.06    STUDIES: No results found.    Impression / Plan:   Carl [name] is a 71 y.o. y/o male with a history of duodenal ulcers who now reports black stools the last few days. The patient has a drop in his hemoglobin. The patient has been started on a Protonix drip. The patient is nothing by mouth and will be set up for an EGD for later today.The patient has been explained the plan and agrees with it.   Thank you for involving me in the care of this patient.  LOS: 1 day   Carl Lame, MD  06/20/2017, 7:37 AM   Note: This dictation was prepared with Dragon dictation along with smaller phrase technology. Any transcriptional errors that result from this process are unintentional.

## 2017-06-21 DIAGNOSIS — D649 Anemia, unspecified: Secondary | ICD-10-CM

## 2017-06-21 LAB — TSH: TSH: 0.847 u[IU]/mL (ref 0.350–4.500)

## 2017-06-21 LAB — CBC
HCT: 21.9 % — ABNORMAL LOW (ref 40.0–52.0)
HEMOGLOBIN: 7.4 g/dL — AB (ref 13.0–18.0)
MCH: 29.6 pg (ref 26.0–34.0)
MCHC: 33.9 g/dL (ref 32.0–36.0)
MCV: 87.5 fL (ref 80.0–100.0)
Platelets: 167 10*3/uL (ref 150–440)
RBC: 2.51 MIL/uL — ABNORMAL LOW (ref 4.40–5.90)
RDW: 16.2 % — AB (ref 11.5–14.5)
WBC: 6.1 10*3/uL (ref 3.8–10.6)

## 2017-06-21 LAB — IRON AND TIBC
IRON: 12 ug/dL — AB (ref 45–182)
Saturation Ratios: 6 % — ABNORMAL LOW (ref 17.9–39.5)
TIBC: 203 ug/dL — ABNORMAL LOW (ref 250–450)
UIBC: 191 ug/dL

## 2017-06-21 LAB — FERRITIN: FERRITIN: 102 ng/mL (ref 24–336)

## 2017-06-21 LAB — HEMOGLOBIN: HEMOGLOBIN: 7.8 g/dL — AB (ref 13.0–18.0)

## 2017-06-21 LAB — PREPARE RBC (CROSSMATCH)

## 2017-06-21 MED ORDER — SODIUM CHLORIDE 0.9 % IV SOLN
Freq: Once | INTRAVENOUS | Status: AC
  Administered 2017-06-21: 17:00:00 via INTRAVENOUS

## 2017-06-21 MED ORDER — PANTOPRAZOLE SODIUM 40 MG PO TBEC
40.0000 mg | DELAYED_RELEASE_TABLET | Freq: Two times a day (BID) | ORAL | Status: DC
  Administered 2017-06-21 – 2017-06-22 (×2): 40 mg via ORAL
  Filled 2017-06-21 (×2): qty 1

## 2017-06-21 NOTE — Progress Notes (Signed)
Carl Darby, MD [address]  [place]  [place], [place] [address]  Main: [phone]  Fax: [phone] Pager: [place] is being followed for upper GI bleed/melena,  Day 3 of follow up    Subjective: Feels good, had liquids for breakfast, tolerated well Denies abd pain, n/v He did not have BM since thursday He started telling me about his chronic HAs for which he has been taking Excedrin for a long time. He has chronic head aches for several years but progressively worsened in 2013 then in 2014 after he had a stroke and cerval spine stenosis. Seems like he saw spine specialist and pain specialists at [facility], received botox twice with no relief. [facility] PCP [facility] who did not prescribe any pain killer yet.   Objective: Vital signs in last 24 hours: Vitals:   06/20/17 1415 06/20/17 2031 06/21/17 0441 06/21/17 0851  BP: 138/82 (!) 156/81 111/67 (!) 144/82  Pulse: 84 92 67 78  Resp: '16 16 18 16  '$ Temp:  98.2 F (36.8 C) 98.2 F (36.8 C) (!) 96.2 F (35.7 C)  TempSrc:  Oral Oral Axillary  SpO2: 100% 94% 96% 97%  Weight:      Height:       Weight change: 0 lb (0 kg)  Intake/Output Summary (Last 24 hours) at 06/21/17 1119 Last data filed at 06/21/17 0805  Gross per 24 hour  Intake          1477.67 ml  Output              100 ml  Net          1377.67 ml     Exam: Heart:: Regular rate and rhythm, S1S2 present or without murmur or extra heart sounds Lungs: clear to auscultation Abdomen: soft, nontender, normal bowel sounds   Lab Results: CBC Latest Ref Rng & Units 06/21/2017 06/20/2017 06/19/2017  WBC 3.8 - 10.6 K/uL 6.1 5.4 5.9  Hemoglobin 13.0 - 18.0 g/dL 7.4(L) 7.7(L) 7.2(L)  Hematocrit 40.0 - 52.0 % 21.9(L) 22.9(L) 21.4(L)  Platelets 150 - 440 K/uL 167 165 202   BMP Latest Ref Rng & Units 06/20/2017 06/19/2017 03/03/2017  Glucose 65 - 99 mg/dL 121(H) 99 100(H)  BUN 6 - 20 mg/dL 41(H) 55(H) 13  Creatinine 0.61  - 1.24 mg/dL 1.25(H) 1.53(H) 1.23  BUN/Creat Ratio 10 - 24 - - 11  Sodium 135 - 145 mmol/L 148(H) 144 143  Potassium 3.5 - 5.1 mmol/L 3.9 2.9(L) 3.4(L)  Chloride 101 - 111 mmol/L 116(H) 105 99  CO2 22 - 32 mmol/L '27 26 23  '$ Calcium 8.9 - 10.3 mg/dL 8.2(L) 9.2 10.0   Hepatic Function Latest Ref Rng & Units 06/19/2017 03/03/2017 11/25/2011  Total Protein 6.5 - 8.1 g/dL 6.1(L) 7.5 6.7  Albumin 3.5 - 5.0 g/dL 3.6 4.8 3.4  AST 15 - 41 U/L '27 18 16  '$ ALT 17 - 63 U/L '18 13 14  '$ Alk Phosphatase 38 - 126 U/L 28(L) 52 46(L)  Total Bilirubin 0.3 - 1.2 mg/dL 0.5 0.6 0.7  Bilirubin, Direct 0.0 - 0.3 mg/dL - - -   Micro Results: No results found for this or any previous visit (from the past 240 hour(s)). Studies/Results: No results found. Medications: I have reviewed the patient's current medications. Scheduled Meds: . atorvastatin  10 mg Oral QPM  . carvedilol  12.5 mg Oral BID  . cloNIDine  0.1 mg Oral BID  .  doxazosin  8 mg Oral QHS  . ferrous sulfate  325 mg Oral BID WC  . hydrALAZINE  100 mg Oral TID  . pantoprazole  40 mg Oral BID  . potassium chloride SA  20 mEq Oral Daily  . verapamil  180 mg Oral QHS   Continuous Infusions: PRN Meds:.acetaminophen **OR** acetaminophen, albuterol, bisacodyl, cyclobenzaprine, HYDROcodone-acetaminophen, morphine injection, ondansetron **OR** ondansetron (ZOFRAN) IV, senna-docusate   Assessment: Active Problems:   GIB (gastrointestinal bleeding)   Melena   Acute esophagogastric ulcer   Duodenum ulcer  S/p EGD on 06/20/17 showed Clean based gastric and duodenal ulcers. No active bleeding at present Hb stable since admission   Plan: - Monitor Hb closely and transfuse 1u PRBcs today - Ok to advance diet - Check ferritin, iron, vitamin B12, folate levels, replete as needed, his vitamin B12 was <300 in 03/2017 - Check Vitamin D levels, low in 03/2017, replete as needed - Check H Pylori IgG, treat if positive - His PTH was elevated in the past, recheck  PTH  - Recheck TSH - Continue protonix or nexium or prilosec '40mg'$  BID for atleast 25month or indefinitely if pt is not willing to stop excedrin for HAs - Avoid NSAIDs/excedrin - Recommend management of chronic headaches as out pt, f/u with PCP - Follow up in GI clinic in 2-3 weeks after discharge   LOS: 2 days   Rohini Vanga 06/21/2017, 11:19 AM

## 2017-06-21 NOTE — Progress Notes (Signed)
[facility] [place] NAME: Carl [name]    MR#:  [id number]  DATE OF BIRTH:  1946-07-24  SUBJECTIVE:  CHIEF COMPLAINT:   Chief Complaint  Patient presents with  . Blood In Stools     Received 2 unit PRBC, had EGD- showed ulcer in stomach and duodenum. Have c/o chronic neck -pain. No melena or bloody stool. Hb down to 7.4. REVIEW OF SYSTEMS:  CONSTITUTIONAL: No fever, fatigue or weakness.  EYES: No blurred or double vision.  EARS, NOSE, AND THROAT: No tinnitus or ear pain.  RESPIRATORY: No cough, shortness of breath, wheezing or hemoptysis.  CARDIOVASCULAR: No chest pain, orthopnea, edema.  GASTROINTESTINAL: No nausea, vomiting, diarrhea or abdominal pain.  GENITOURINARY: No dysuria, hematuria.  ENDOCRINE: No polyuria, nocturia,  HEMATOLOGY: No anemia, easy bruising or bleeding SKIN: No rash or lesion. MUSCULOSKELETAL: No joint pain or arthritis.  Neck pain. NEUROLOGIC: No tingling, numbness, weakness.  PSYCHIATRY: No anxiety or depression.   ROS  DRUG ALLERGIES:   Allergies  Allergen Reactions  . Ace Inhibitors Other (See Comments)    unknown  . Hydrochlorothiazide Other (See Comments)    unknown  . Lisinopril Other (See Comments)    unknown  . Nsaids Other (See Comments)    Gi bleed  . Ramipril Other (See Comments)    unknown    VITALS:  Blood pressure (!) 160/85, pulse (!) 58, temperature 97.9 F (36.6 C), temperature source Oral, resp. rate 18, height 5\' 4"  (1.626 m), weight 188 lb (85.3 kg), SpO2 97 %.  PHYSICAL EXAMINATION:  GENERAL:  71 y.o.-year-old patient lying in the bed with no acute distress.  EYES: Pupils equal, round, reactive to light and accommodation. No scleral icterus. Extraocular muscles intact.  HEENT: Head atraumatic, normocephalic. Oropharynx and nasopharynx clear.  NECK:  Supple, no jugular venous distention. No thyroid enlargement, no tenderness.  LUNGS: Normal breath sounds bilaterally, no  wheezing, rales,rhonchi or crepitation. No use of accessory muscles of respiration.  CARDIOVASCULAR: S1, S2 normal. No murmurs, rubs, or gallops.  ABDOMEN: Soft, nontender, nondistended. Bowel sounds present. No organomegaly or mass.  EXTREMITIES: No pedal edema, cyanosis, or clubbing.  NEUROLOGIC: Cranial nerves II through XII are intact. Muscle strength 5/5 in all extremities. Sensation intact. Gait not checked.  PSYCHIATRIC: The patient is alert and oriented x 3.  SKIN: No obvious rash, lesion, or ulcer.   Physical Exam LABORATORY PANEL:   CBC  Recent Labs Lab 06/21/17 0437 06/21/17 1348  WBC 6.1  --   HGB 7.4* 7.8*  HCT 21.9*  --   PLT 167  --    ------------------------------------------------------------------------------------------------------------------  Chemistries   Recent Labs Lab 06/19/17 1424 06/20/17 0302  NA 144 148*  K 2.9* 3.9  CL 105 116*  CO2 26 27  GLUCOSE 99 121*  BUN 55* 41*  CREATININE 1.53* 1.25*  CALCIUM 9.2 8.2*  MG 1.6*  --   AST 27  --   ALT 18  --   ALKPHOS 28*  --   BILITOT 0.5  --    ------------------------------------------------------------------------------------------------------------------  Cardiac Enzymes No results for input(s): TROPONINI in the last 168 hours. ------------------------------------------------------------------------------------------------------------------  RADIOLOGY:  No results found.  ASSESSMENT AND PLAN:   Active Problems:   GIB (gastrointestinal bleeding)   Melena   Acute esophagogastric ulcer   Duodenum ulcer   * GI bleeding,  EGD done- Non bleeding Gastric and duodenal ulcers. Advanced to full liquid diet, Protonix po bid For at least  3 months per Dr. Marius Ditch.  * Anemia due to acute blood loss.  s/p 2 units PRBC transfusion.  Hb 7.4 this am, one unit PRBC transfusion today per Dr. Marius Ditch. Follow-up hemoglobin after blood transfusion.   Oral iron.  * Hypokalemia. Given potassium  supplement and improved. Hypomagnesemia. Follow-up magnesium level.  * Acute renal failure. Hold losartan,  Improving with IV fluid support. Resume losartan.  * Hypertension. Continue home hypertension medication. resume losartan.  I discussed with Dr. Marius Ditch. All the records are reviewed and case discussed with Care Management/Social Workerr. Management plans discussed with the patient, family and they are in agreement.  CODE STATUS: Full.  TOTAL TIME TAKING CARE OF THIS PATIENT: 35 minutes.    POSSIBLE D/C IN 1-2 DAYS, DEPENDING ON CLINICAL CONDITION.   Demetrios Loll M.D on 06/21/2017   Between 7am to 6pm - Pager - [phone]  After 6pm go to [email or web address] - password [email or web address] [facility]  Office  [phone]  CC: Primary care physician; Hortencia Pilar, MD  Note: This dictation was prepared with Dragon dictation along with smaller phrase technology. Any transcriptional errors that result from this process are unintentional.

## 2017-06-22 LAB — BPAM RBC
BLOOD PRODUCT EXPIRATION DATE: 201810232359
BLOOD PRODUCT EXPIRATION DATE: 201810292359
BLOOD PRODUCT EXPIRATION DATE: 201810302359
ISSUE DATE / TIME: 201810181712
ISSUE DATE / TIME: 201810182239
ISSUE DATE / TIME: 201810201628
UNIT TYPE AND RH: 6200
UNIT TYPE AND RH: 6200
Unit Type and Rh: 6200

## 2017-06-22 LAB — TYPE AND SCREEN
ABO/RH(D): A POS
Antibody Screen: NEGATIVE
UNIT DIVISION: 0
UNIT DIVISION: 0
UNIT DIVISION: 0

## 2017-06-22 LAB — MAGNESIUM: Magnesium: 1.7 mg/dL (ref 1.7–2.4)

## 2017-06-22 LAB — VITAMIN B12: VITAMIN B 12: 154 pg/mL — AB (ref 180–914)

## 2017-06-22 LAB — HEMOGLOBIN: HEMOGLOBIN: 8.5 g/dL — AB (ref 13.0–18.0)

## 2017-06-22 MED ORDER — CYANOCOBALAMIN 1000 MCG PO TABS
1000.0000 ug | ORAL_TABLET | Freq: Every day | ORAL | 0 refills | Status: DC
Start: 2017-06-23 — End: 2017-06-22

## 2017-06-22 MED ORDER — HYDROCODONE-ACETAMINOPHEN 5-325 MG PO TABS: 1.0000 | ORAL_TABLET | Freq: Four times a day (QID) | ORAL | 0 refills | Status: DC | PRN

## 2017-06-22 MED ORDER — CYANOCOBALAMIN 1000 MCG/ML IJ SOLN
1000.0000 ug | Freq: Once | INTRAMUSCULAR | Status: DC
  Filled 2017-06-22: qty 1

## 2017-06-22 MED ORDER — FERROUS SULFATE 325 (65 FE) MG PO TABS: 325.0000 mg | ORAL_TABLET | Freq: Two times a day (BID) | ORAL | 1 refills | Status: DC

## 2017-06-22 MED ORDER — PANTOPRAZOLE SODIUM 40 MG PO TBEC: 40.0000 mg | DELAYED_RELEASE_TABLET | Freq: Two times a day (BID) | ORAL | 2 refills | Status: DC

## 2017-06-22 MED ORDER — VITAMIN B-12 1000 MCG PO TABS: 1000.0000 ug | ORAL_TABLET | Freq: Every day | ORAL | Status: DC

## 2017-06-22 MED ORDER — CYANOCOBALAMIN 1000 MCG PO TABS: 1000.0000 ug | ORAL_TABLET | Freq: Every day | ORAL | 0 refills | Status: DC

## 2017-06-22 NOTE — Progress Notes (Signed)
Received call from Dr. Bridgett Larsson that he had called in a prescription for the pt; asked me to call and notify the pt. Per Dr Lianne Moris request, I called and spoke to [name] (spouse) and notified her.

## 2017-06-22 NOTE — Progress Notes (Addendum)
RN received a call from Dr. Marius Ditch re this pt. I told Dr Marius Ditch that pt had already been d/c and had left hospital. Dr Marius Ditch then requested me to page Dr. Bridgett Larsson and to have him call her. Per Dr. Verlin Grills order, Dr. Bridgett Larsson paged and notified and given Dr. Verlin Grills phone number with request to call

## 2017-06-22 NOTE — Discharge Summary (Addendum)
[facility] NAME: Carl [name]    MR#:  [id number]  DATE OF BIRTH:  December 01, 1945  DATE OF ADMISSION:  06/19/2017   ADMITTING PHYSICIAN: Demetrios Loll, MD  DATE OF DISCHARGE: 06/22/2017 PRIMARY CARE PHYSICIAN: Hortencia Pilar, MD   ADMISSION DIAGNOSIS:  Melena [K92.1] Symptomatic anemia [D64.9] DISCHARGE DIAGNOSIS:  Active Problems:   GIB (gastrointestinal bleeding)   Melena   Acute esophagogastric ulcer   Duodenum ulcer  SECONDARY DIAGNOSIS:   Past Medical History:  Diagnosis Date  . Alcoholism ([place])    still drinking wine as of Feb 2013  . Anxiety and depression   . Cataract   . Cervical spondylosis without myelopathy   . Cervicalgia of occipito-atlanto-axial region   . Chronic pain disorder   . Degeneration of cervical intervertebral disc   . Duodenal ulcer 2011  . Facet arthropathy, cervical   . Fuchs' corneal dystrophy   . Gastric ulcer 2011   egd at [place] Aug 2011.  . GI bleed   . Gout   . Hypertension   . Impaired fasting glucose   . 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 LVH (left ventricular hypertrophy)   . Mitral insufficiency   . Neck pain   . Neuralgia and neuritis   . Nocturia   . Occipital neuralgia   . Osteoarthritis    neck  . Osteoarthritis   . Pure hypercholesterolemia   . Radiculitis   . Stroke ([place])   . Tricuspid insufficiency    HOSPITAL COURSE:  * GI bleeding,  EGD done- Non bleeding Gastric and duodenal ulcers. Advanced to soft diet, Protonix po bid For at least 3 months per Dr. Marius Ditch.  * Anemia due to acute blood loss.  s/p 3 units PRBC transfusion.  Hb 8.5 today. continue  Oral iron.  * Hypokalemia. Given potassium supplement and improved. Hypomagnesemia. Improved.  * Acute renal failure. Hold losartan,  Improved with IV fluid support. Resumed losartan.  * Hypertension. Continue home hypertension medication. resumed  losartan.  Vitamin B-12 insufficiency. Vitamin B-12 by mouth daily.  I discussed with Dr. Marius Ditch.  DISCHARGE CONDITIONS:  Stable, discharge to home today. CONSULTS OBTAINED:  Treatment Team:  Lucilla Lame, MD Lin Landsman, MD DRUG ALLERGIES:   Allergies  Allergen Reactions  . Ace Inhibitors Other (See Comments)    unknown  . Hydrochlorothiazide Other (See Comments)    unknown  . Lisinopril Other (See Comments)    unknown  . Nsaids Other (See Comments)    Gi bleed  . Ramipril Other (See Comments)    unknown   DISCHARGE MEDICATIONS:   Allergies as of 06/22/2017      Reactions   Ace Inhibitors Other (See Comments)   unknown   Hydrochlorothiazide Other (See Comments)   unknown   Lisinopril Other (See Comments)   unknown   Nsaids Other (See Comments)   Gi bleed   Ramipril Other (See Comments)   unknown      Medication List    STOP taking these medications   aspirin-acetaminophen-caffeine 250-250-65 MG tablet Commonly known as:  EXCEDRIN MIGRAINE   atorvastatin 10 MG tablet Commonly known as:  LIPITOR     TAKE these medications   acetaminophen 650 MG CR tablet Commonly known as:  TYLENOL Take 650 mg by mouth every 2 (two) hours.   carvedilol 12.5 MG tablet Commonly known as:  COREG Take 12.5 mg by mouth  2 (two) times daily.   cloNIDine 0.1 MG tablet Commonly known as:  CATAPRES Take 0.1 mg by mouth 2 (two) times daily.   cyanocobalamin 1000 MCG tablet Take 1 tablet (1,000 mcg total) by mouth daily.   doxazosin 8 MG tablet Commonly known as:  CARDURA Take 8 mg by mouth at bedtime.   ferrous sulfate 325 (65 FE) MG tablet Take 1 tablet (325 mg total) by mouth 2 (two) times daily with a meal.   hydrALAZINE 100 MG tablet Commonly known as:  APRESOLINE Take 100 mg by mouth 3 (three) times daily.   HYDROcodone-acetaminophen 5-325 MG tablet Commonly known as:  NORCO/VICODIN Take 1 tablet by mouth every 6 (six) hours as needed for moderate  pain.   losartan 100 MG tablet Commonly known as:  COZAAR Take 100 mg by mouth daily.   pantoprazole 40 MG tablet Commonly known as:  PROTONIX Take 1 tablet (40 mg total) by mouth 2 (two) times daily before a meal.   potassium chloride SA 20 MEQ tablet Commonly known as:  K-DUR,KLOR-CON Take 1 tablet by mouth daily.   verapamil 180 MG CR tablet Commonly known as:  CALAN-SR Take 180 mg by mouth at bedtime.   Vitamin D (Ergocalciferol) 50000 units Caps capsule Commonly known as:  DRISDOL Take 1 capsule (50,000 Units total) by mouth weekly for 12 weeks.        DISCHARGE INSTRUCTIONS:  See AVS.  If you experience worsening of your admission symptoms, develop shortness of breath, life threatening emergency, suicidal or homicidal thoughts you must seek medical attention immediately by calling 911 or calling your MD immediately  if symptoms less severe.  You Must read complete instructions/literature along with all the possible adverse reactions/side effects for all the Medicines you take and that have been prescribed to you. Take any new Medicines after you have completely understood and accpet all the possible adverse reactions/side effects.   Please note  You were cared for by a hospitalist during your hospital stay. If you have any questions about your discharge medications or the care you received while you were in the hospital after you are discharged, you can call the unit and asked to speak with the hospitalist on call if the hospitalist that took care of you is not available. Once you are discharged, your primary care physician will handle any further medical issues. Please note that NO REFILLS for any discharge medications will be authorized once you are discharged, as it is imperative that you return to your primary care physician (or establish a relationship with a primary care physician if you do not have one) for your aftercare needs so that they can reassess your need for  medications and monitor your lab values.    On the day of Discharge:  VITAL SIGNS:  Blood pressure 126/75, pulse 70, temperature 97.8 F (36.6 C), temperature source Oral, resp. rate 20, height 5\' 4"  (1.626 m), weight 188 lb (85.3 kg), SpO2 96 %. PHYSICAL EXAMINATION:  GENERAL:  71 y.o.-year-old patient lying in the bed with no acute distress.  EYES: Pupils equal, round, reactive to light and accommodation. No scleral icterus. Extraocular muscles intact.  HEENT: Head atraumatic, normocephalic. Oropharynx and nasopharynx clear.  NECK:  Supple, no jugular venous distention. No thyroid enlargement, no tenderness.  LUNGS: Normal breath sounds bilaterally, no wheezing, rales,rhonchi or crepitation. No use of accessory muscles of respiration.  CARDIOVASCULAR: S1, S2 normal. No murmurs, rubs, or gallops.  ABDOMEN: Soft, non-tender, non-distended. Bowel sounds  present. No organomegaly or mass.  EXTREMITIES: No pedal edema, cyanosis, or clubbing.  NEUROLOGIC: Cranial nerves II through XII are intact. Muscle strength 5/5 in all extremities. Sensation intact. Gait not checked.  PSYCHIATRIC: The patient is alert and oriented x 3.  SKIN: No obvious rash, lesion, or ulcer.  DATA REVIEW:   CBC  Recent Labs Lab 06/21/17 0437  06/22/17 0459  WBC 6.1  --   --   HGB 7.4*  < > 8.5*  HCT 21.9*  --   --   PLT 167  --   --   < > = values in this interval not displayed.  Chemistries   Recent Labs Lab 06/19/17 1424 06/20/17 0302 06/22/17 0459  NA 144 148*  --   K 2.9* 3.9  --   CL 105 116*  --   CO2 26 27  --   GLUCOSE 99 121*  --   BUN 55* 41*  --   CREATININE 1.53* 1.25*  --   CALCIUM 9.2 8.2*  --   MG 1.6*  --  1.7  AST 27  --   --   ALT 18  --   --   ALKPHOS 28*  --   --   BILITOT 0.5  --   --      Microbiology Results  Results for orders placed or performed during the hospital encounter of 10/08/11  MRSA PCR Screening     Status: None   Collection Time: 10/08/11  3:05 AM    Result Value Ref Range Status   MRSA by PCR NEGATIVE NEGATIVE Final    Comment:        The GeneXpert MRSA Assay (FDA approved for NASAL specimens only), is one component of a comprehensive MRSA colonization surveillance program. It is not intended to diagnose MRSA infection nor to guide or monitor treatment for MRSA infections.    RADIOLOGY:  No results found.   Management plans discussed with the patient, family and they are in agreement.  CODE STATUS: Full Code   TOTAL TIME TAKING CARE OF THIS PATIENT: 35 minutes.    Demetrios Loll M.D on 06/22/2017 at 1:48 PM  Between 7am to 6pm - Pager - [phone]  After 6pm go to [email or web address] - Proofreader  [facility]  Office  [phone]  CC: Primary care physician; Hortencia Pilar, MD   Note: This dictation was prepared with Dragon dictation along with smaller phrase technology. Any transcriptional errors that result from this process are unintentional.

## 2017-06-22 NOTE — Progress Notes (Signed)
Pt d/c home; d/c instructions reviewed w/ pt; pt understanding was verbalized; IV removed, catheter in tact, gauze dressing applied; all pt questions answered; pt verbalized that all pt belongings were accounted for; pt left unit via wheelchair accompanied by staff 

## 2017-06-22 NOTE — Discharge Instructions (Signed)
Avoid NSAIDs/excedrin Heart healthy soft diet.   Follow all MD discharge instructions. Take all medications as prescribed. Keep all follow up appointments. If your symptoms return, call your doctor. If you experience any new symptoms that are of concern to you or that are bothersome to you, call your doctor. For all questions and/or concerns, call your doctor.  If you have a medical emergency, call 911   Anemia, Nonspecific Anemia is a condition in which the concentration of red blood cells or hemoglobin in the blood is below normal. Hemoglobin is a substance in red blood cells that carries oxygen to the tissues of the body. Anemia results in not enough oxygen reaching these tissues. What are the causes? Common causes of anemia include:  Excessive bleeding. Bleeding may be internal or external. This includes excessive bleeding from periods (in women) or from the intestine.  Poor nutrition.  Chronic kidney, thyroid, and liver disease.  Bone marrow disorders that decrease red blood cell production.  Cancer and treatments for cancer.  HIV, AIDS, and their treatments.  Spleen problems that increase red blood cell destruction.  Blood disorders.  Excess destruction of red blood cells due to infection, medicines, and autoimmune disorders.  What are the signs or symptoms?  Minor weakness.  Dizziness.  Headache.  Palpitations.  Shortness of breath, especially with exercise.  Paleness.  Cold sensitivity.  Indigestion.  Nausea.  Difficulty sleeping.  Difficulty concentrating. Symptoms may occur suddenly or they may develop slowly. How is this diagnosed? Additional blood tests are often needed. These help your health care provider determine the best treatment. Your health care provider will check your stool for blood and look for other causes of blood loss. How is this treated? Treatment varies depending on the cause of the anemia. Treatment can include:  Supplements  of iron, vitamin O87, or folic acid.  Hormone medicines.  A blood transfusion. This may be needed if blood loss is severe.  Hospitalization. This may be needed if there is significant continual blood loss.  Dietary changes.  Spleen removal.  Follow these instructions at home: Keep all follow-up appointments. It often takes many weeks to correct anemia, and having your health care provider check on your condition and your response to treatment is very important. Get help right away if:  You develop extreme weakness, shortness of breath, or chest pain.  You become dizzy or have trouble concentrating.  You develop heavy vaginal bleeding.  You develop a rash.  You have bloody or black, tarry stools.  You faint.  You vomit up blood.  You vomit repeatedly.  You have abdominal pain.  You have a fever or persistent symptoms for more than 2-3 days.  You have a fever and your symptoms suddenly get worse.  You are dehydrated. This information is not intended to replace advice given to you by your health care provider. Make sure you discuss any questions you have with your health care provider. Document Released: 09/26/2004 Document Revised: 01/31/2016 Document Reviewed: 02/12/2013 Elsevier Interactive Patient Education  2017 [employer].

## 2017-06-23 LAB — FOLATE RBC
Folate, Hemolysate: 267.4 ng/mL
Folate, RBC: 1194 ng/mL (ref >498)
Hematocrit: 22.4 % — ABNORMAL LOW (ref 37.5–51.0)

## 2017-06-23 LAB — PARATHYROID HORMONE, INTACT (NO CA)

## 2017-06-23 NOTE — Anesthesia Postprocedure Evaluation (Signed)
Anesthesia Post Note  Patient: Carl [name]  Procedure(s) Performed: ESOPHAGOGASTRODUODENOSCOPY (EGD) WITH PROPOFOL (N/A )  Patient location during evaluation: Endoscopy Anesthesia Type: General Level of consciousness: awake and alert Pain management: pain level controlled Vital Signs Assessment: post-procedure vital signs reviewed and stable Respiratory status: spontaneous breathing, nonlabored ventilation, respiratory function stable and patient connected to nasal cannula oxygen Cardiovascular status: blood pressure returned to baseline and stable Postop Assessment: no apparent nausea or vomiting Anesthetic complications: no     Last Vitals:  Vitals:   06/22/17 0540 06/22/17 0942  BP: 126/75   Pulse: (!) 55 70  Resp: 20   Temp: 36.6 C   SpO2: 96%     Last Pain:  Vitals:   06/22/17 1219  TempSrc:   PainSc: 7                  Martha Clan

## 2017-06-24 ENCOUNTER — Encounter: Payer: Self-pay | Admitting: Gastroenterology

## 2017-06-24 LAB — H PYLORI, IGM, IGG, IGA AB

## 2017-06-24 LAB — VITAMIN D 25 HYDROXY (VIT D DEFICIENCY, FRACTURES): VIT D 25 HYDROXY: 39.2 ng/mL (ref 30.0–100.0)

## 2017-06-30 ENCOUNTER — Other Ambulatory Visit: Payer: Self-pay | Admitting: Nurse Practitioner

## 2017-06-30 DIAGNOSIS — E559 Vitamin D deficiency, unspecified: Secondary | ICD-10-CM

## 2017-07-10 ENCOUNTER — Other Ambulatory Visit: Payer: Self-pay

## 2017-07-10 ENCOUNTER — Ambulatory Visit: Payer: Medicare Other | Admitting: Gastroenterology

## 2017-07-10 ENCOUNTER — Encounter: Payer: Self-pay | Admitting: Gastroenterology

## 2017-07-10 VITALS — BP 144/88 | HR 87 | Temp 98.1°F | Ht 68.0 in | Wt 185.5 lb

## 2017-07-10 DIAGNOSIS — D62 Acute posthemorrhagic anemia: Secondary | ICD-10-CM | POA: Diagnosis not present

## 2017-07-10 NOTE — Progress Notes (Signed)
Primary Care Physician: Hortencia Pilar, MD  Primary Gastroenterologist:  Dr. Lucilla Lame  No chief complaint on file.   HPI: Carl [name] is a 71 y.o. male here for follow-up after being in the hospital. The patient was in the hospital with melena. The patient underwent an EGD that showed him to have a healing gastric ulcer and duodenal ulcers. The patient had no further sign of bleeding during his hospital stay and was sent home with a follow-up with me today. The patient had been told to stop taking his NSAIDs.  Current Outpatient Medications  Medication Sig Dispense Refill  . acetaminophen (TYLENOL) 650 MG CR tablet Take 650 mg by mouth every 2 (two) hours.    . carvedilol (COREG) 12.5 MG tablet Take 12.5 mg by mouth 2 (two) times daily.   0  . cloNIDine (CATAPRES) 0.1 MG tablet Take 0.1 mg by mouth 2 (two) times daily.    . cyanocobalamin 1000 MCG tablet Take 1 tablet (1,000 mcg total) by mouth daily. 14 tablet 0  . doxazosin (CARDURA) 8 MG tablet Take 8 mg by mouth at bedtime.      . ferrous sulfate 325 (65 FE) MG tablet Take 1 tablet (325 mg total) by mouth 2 (two) times daily with a meal. 60 tablet 1  . hydrALAZINE (APRESOLINE) 100 MG tablet Take 100 mg by mouth 3 (three) times daily.    Marland Kitchen HYDROcodone-acetaminophen (NORCO/VICODIN) 5-325 MG tablet Take 1 tablet by mouth every 6 (six) hours as needed for moderate pain. 12 tablet 0  . losartan (COZAAR) 100 MG tablet Take 100 mg by mouth daily.     . pantoprazole (PROTONIX) 40 MG tablet Take 1 tablet (40 mg total) by mouth 2 (two) times daily before a meal. 60 tablet 2  . potassium chloride SA (K-DUR,KLOR-CON) 20 MEQ tablet Take 1 tablet by mouth daily.    . verapamil (CALAN-SR) 180 MG CR tablet Take 180 mg by mouth at bedtime.    . Vitamin D, Ergocalciferol, (DRISDOL) 50000 units CAPS capsule Take 1 capsule (50,000 Units total) by mouth weekly for 12 weeks. 12 capsule 0   No current facility-administered medications for this visit.      Allergies as of 07/10/2017 - Review Complete 06/20/2017  Allergen Reaction Noted  . Ace inhibitors Other (See Comments) 12/06/2016  . Hydrochlorothiazide Other (See Comments) 07/16/2005  . Lisinopril Other (See Comments) 12/06/2016  . Nsaids Other (See Comments) 12/06/2016  . Ramipril Other (See Comments)     ROS:  General: Negative for anorexia, weight loss, fever, chills, fatigue, weakness. ENT: Negative for hoarseness, difficulty swallowing , nasal congestion. CV: Negative for chest pain, angina, palpitations, dyspnea on exertion, peripheral edema.  Respiratory: Negative for dyspnea at rest, dyspnea on exertion, cough, sputum, wheezing.  GI: See history of present illness. GU:  Negative for dysuria, hematuria, urinary incontinence, urinary frequency, nocturnal urination.  Endo: Negative for unusual weight change.    Physical Examination:   There were no vitals taken for this visit.  General: Well-nourished, well-developed in no acute distress.  Eyes: No icterus. Conjunctivae pink. Mouth: Oropharyngeal mucosa moist and pink , no lesions erythema or exudate. Lungs: Clear to auscultation bilaterally. Non-labored. Heart: Regular rate and rhythm, no murmurs rubs or gallops.  Abdomen: Bowel sounds are normal, nontender, nondistended, no hepatosplenomegaly or masses, no abdominal bruits or hernia , no rebound or guarding.   Extremities: No lower extremity edema. No clubbing or deformities. Neuro: Alert and oriented x 3.  Grossly intact. Skin: Warm and dry, no jaundice.   Psych: Alert and cooperative, normal mood and affect.  Labs:    Imaging Studies: No results found.  Assessment and Plan:   Carl [name] is a 71 y.o. y/o male who was in the hospital with a gastric ulcer and duodenal ulcers with melena. The patient was also found to have anemia and needed a transfusion. The patient was sent home with follow-up to see me today. The patient has been doing well. The patient  will have his hemoglobin checked today. He will continue to avoid anti-inflammatory medications. The patient has been explained the plan and agrees with it.    Lucilla Lame, MD. Marval Regal   Note: This dictation was prepared with Dragon dictation along with smaller phrase technology. Any transcriptional errors that result from this process are unintentional.

## 2018-03-20 DIAGNOSIS — Z86010 Personal history of colonic polyps: Secondary | ICD-10-CM | POA: Insufficient documentation

## 2018-03-20 DIAGNOSIS — Z8719 Personal history of other diseases of the digestive system: Secondary | ICD-10-CM | POA: Insufficient documentation

## 2018-04-01 DIAGNOSIS — F99 Mental disorder, not otherwise specified: Secondary | ICD-10-CM | POA: Insufficient documentation

## 2018-04-01 DIAGNOSIS — R4689 Other symptoms and signs involving appearance and behavior: Secondary | ICD-10-CM | POA: Insufficient documentation

## 2018-06-05 ENCOUNTER — Other Ambulatory Visit: Payer: Self-pay

## 2018-06-05 ENCOUNTER — Ambulatory Visit: Payer: Medicare Other | Admitting: Anesthesiology

## 2018-06-05 ENCOUNTER — Ambulatory Visit
Admission: RE | Admit: 2018-06-05 | Discharge: 2018-06-05 | Disposition: A | Discharge status: Home / Self Care | Payer: Medicare Other | Source: Ambulatory Visit | Attending: Unknown Physician Specialty | Admitting: Unknown Physician Specialty

## 2018-06-05 ENCOUNTER — Encounter
Admission: RE | Disposition: A | Discharge status: Home / Self Care | Payer: Self-pay | Source: Ambulatory Visit | Attending: Unknown Physician Specialty

## 2018-06-05 DIAGNOSIS — I10 Essential (primary) hypertension: Secondary | ICD-10-CM | POA: Diagnosis not present

## 2018-06-05 DIAGNOSIS — Z1211 Encounter for screening for malignant neoplasm of colon: Secondary | ICD-10-CM | POA: Insufficient documentation

## 2018-06-05 DIAGNOSIS — K640 First degree hemorrhoids: Secondary | ICD-10-CM | POA: Insufficient documentation

## 2018-06-05 DIAGNOSIS — Z87891 Personal history of nicotine dependence: Secondary | ICD-10-CM | POA: Diagnosis not present

## 2018-06-05 DIAGNOSIS — F419 Anxiety disorder, unspecified: Secondary | ICD-10-CM | POA: Diagnosis not present

## 2018-06-05 DIAGNOSIS — F329 Major depressive disorder, single episode, unspecified: Secondary | ICD-10-CM | POA: Diagnosis not present

## 2018-06-05 DIAGNOSIS — Z79899 Other long term (current) drug therapy: Secondary | ICD-10-CM | POA: Diagnosis not present

## 2018-06-05 DIAGNOSIS — K5730 Diverticulosis of large intestine without perforation or abscess without bleeding: Secondary | ICD-10-CM | POA: Diagnosis not present

## 2018-06-05 DIAGNOSIS — E7800 Pure hypercholesterolemia, unspecified: Secondary | ICD-10-CM | POA: Diagnosis not present

## 2018-06-05 DIAGNOSIS — Z8673 Personal history of transient ischemic attack (TIA), and cerebral infarction without residual deficits: Secondary | ICD-10-CM | POA: Insufficient documentation

## 2018-06-05 DIAGNOSIS — Z86010 Personal history of colonic polyps: Secondary | ICD-10-CM | POA: Diagnosis not present

## 2018-06-05 DIAGNOSIS — M109 Gout, unspecified: Secondary | ICD-10-CM | POA: Insufficient documentation

## 2018-06-05 HISTORY — DX: Benign neoplasm of colon, unspecified: D12.6

## 2018-06-05 HISTORY — DX: Other cervical disc degeneration, unspecified cervical region: M50.30

## 2018-06-05 HISTORY — PX: COLONOSCOPY WITH PROPOFOL: SHX5780

## 2018-06-05 HISTORY — DX: Mild cognitive impairment of uncertain or unknown etiology: G31.84

## 2018-06-05 SURGERY — COLONOSCOPY WITH PROPOFOL
Anesthesia: General

## 2018-06-05 MED ORDER — PROPOFOL 10 MG/ML IV BOLUS
INTRAVENOUS | Status: DC | PRN
  Administered 2018-06-05 (×2): 20 mg via INTRAVENOUS

## 2018-06-05 MED ORDER — FENTANYL CITRATE (PF) 100 MCG/2ML IJ SOLN
INTRAMUSCULAR | Status: AC
  Filled 2018-06-05: qty 2

## 2018-06-05 MED ORDER — FENTANYL CITRATE (PF) 100 MCG/2ML IJ SOLN
INTRAMUSCULAR | Status: DC | PRN
  Administered 2018-06-05: 25 ug via INTRAVENOUS

## 2018-06-05 MED ORDER — MIDAZOLAM HCL 2 MG/2ML IJ SOLN
INTRAMUSCULAR | Status: AC
  Filled 2018-06-05: qty 2

## 2018-06-05 MED ORDER — MIDAZOLAM HCL 5 MG/5ML IJ SOLN
INTRAMUSCULAR | Status: DC | PRN
  Administered 2018-06-05: 2 mg via INTRAVENOUS

## 2018-06-05 MED ORDER — LIDOCAINE HCL (PF) 2 % IJ SOLN
INTRAMUSCULAR | Status: DC | PRN
  Administered 2018-06-05: 80 mg

## 2018-06-05 MED ORDER — SODIUM CHLORIDE 0.9 % IV SOLN
INTRAVENOUS | Status: DC
  Administered 2018-06-05: 10:00:00 via INTRAVENOUS

## 2018-06-05 MED ORDER — LIDOCAINE HCL (PF) 2 % IJ SOLN
INTRAMUSCULAR | Status: AC
  Filled 2018-06-05: qty 10

## 2018-06-05 MED ORDER — PROPOFOL 500 MG/50ML IV EMUL
INTRAVENOUS | Status: DC | PRN
  Administered 2018-06-05: 50 ug/kg/min via INTRAVENOUS

## 2018-06-05 MED ORDER — SODIUM CHLORIDE 0.9 % IV SOLN: INTRAVENOUS | Status: DC

## 2018-06-05 NOTE — Transfer of Care (Signed)
Immediate Anesthesia Transfer of Care Note  Patient: Carl [name]  Procedure(s) Performed: COLONOSCOPY WITH PROPOFOL (N/A )  Patient Location: PACU  Anesthesia Type:General  Level of Consciousness: sedated  Airway & Oxygen Therapy: Patient Spontanous Breathing and Patient connected to nasal cannula oxygen  Post-op Assessment: Report given to RN and Post -op Vital signs reviewed and stable  Post vital signs: Reviewed and stable  Last Vitals:  Vitals Value Taken Time  BP 135/77 06/05/2018 10:20 AM  Temp 36.4 C 06/05/2018 10:20 AM  Pulse 60 06/05/2018 10:21 AM  Resp 22 06/05/2018 10:21 AM  SpO2 99 % 06/05/2018 10:21 AM  Vitals shown include unvalidated device data.  Last Pain:  Vitals:   06/05/18 1020  TempSrc: Tympanic  PainSc:          Complications: No apparent anesthesia complications

## 2018-06-05 NOTE — Op Note (Signed)
[facility]enter[facility] Patient Name: Carl [name] Procedure Date: 06/05/2018 9:53 AM MRN: [id number] Account #: [id number] Date of Birth: June 12, 1946 Admit Type: Outpatient Age: 72 Room: [facility] ENDO ROOM 1 Gender: Male Note Status: Finalized Procedure:            Colonoscopy Indications:          High risk colon cancer surveillance: Personal history                        of colonic polyps Providers:            Manya Silvas, MD Medicines:            Propofol per Anesthesia Complications:        No immediate complications. Procedure:            Pre-Anesthesia Assessment:                       - After reviewing the risks and benefits, the patient                        was deemed in satisfactory condition to undergo the                        procedure.                       After obtaining informed consent, the colonoscope was                        passed under direct vision. Throughout the procedure,                        the patient's blood pressure, pulse, and oxygen                        saturations were monitored continuously. The                        Colonoscope was introduced through the anus and                        advanced to the the cecum, identified by appendiceal                        orifice and ileocecal valve. The colonoscopy was                        performed without difficulty. The patient tolerated the                        procedure well. The quality of the bowel preparation                        was good. Findings:      Multiple small-mouthed diverticula were found in the sigmoid colon,       descending colon and transverse colon.      Internal hemorrhoids were found during endoscopy. The hemorrhoids were       small and Grade I (internal hemorrhoids that do not prolapse).      The exam was otherwise without abnormality. Impression:           -  Diverticulosis in the sigmoid colon, in the                        descending  colon and in the transverse colon.                       - Internal hemorrhoids.                       - The examination was otherwise normal.                       - No specimens collected. Recommendation:       - Repeat colonoscopy in 5 years for surveillance. Manya Silvas, MD 06/05/2018 10:20:26 AM This report has been signed electronically. Number of Addenda: 0 Note Initiated On: 06/05/2018 9:53 AM Scope Withdrawal Time: 0 hours 5 minutes 50 seconds  Total Procedure Duration: 0 hours 11 minutes 25 seconds       [facility]

## 2018-06-05 NOTE — H&P (Signed)
Primary Care Physician:  Hortencia Pilar, MD Primary Gastroenterologist:  Dr. Vira Agar  Pre-Procedure History & Physical: HPI:  Carl [name] is a 72 y.o. male is here for an Previous polyp found 10/07/2013.   Past Medical History:  Diagnosis Date  . Adenoma of colon   . Alcoholism ([place])    still drinking wine as of Feb 2013  . Anxiety and depression   . Cataract   . Cervical spondylosis without myelopathy   . Cervicalgia of occipito-atlanto-axial region   . Chronic pain disorder   . Degeneration of cervical intervertebral disc   . Degeneration, intervertebral disc, cervical   . Duodenal ulcer 2011  . Facet arthropathy, cervical   . Fuchs' corneal dystrophy   . Gastric ulcer 2011   egd at [place] Aug 2011.  . GI bleed   . Gout   . Hypertension   . Impaired fasting glucose   . 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 LVH (left ventricular hypertrophy)   . Mild cognitive impairment   . Mitral insufficiency   . Neck pain   . Neuralgia and neuritis   . Nocturia   . Occipital neuralgia   . Osteoarthritis    neck  . Osteoarthritis   . Pure hypercholesterolemia   . Radiculitis   . Stroke ([place])   . Tricuspid insufficiency     Past Surgical History:  Procedure Laterality Date  . CYSTOGRAM  03/08  . ESOPHAGOGASTRODUODENOSCOPY (EGD) WITH PROPOFOL N/A 06/20/2017   Procedure: ESOPHAGOGASTRODUODENOSCOPY (EGD) WITH PROPOFOL;  Surgeon: Lucilla Lame, MD;  Location: [facility] ENDOSCOPY;  Service: Endoscopy;  Laterality: N/A;    Prior to Admission medications   Medication Sig Start Date End Date Taking? Authorizing Provider  acetaminophen (TYLENOL) 650 MG CR tablet Take 650 mg by mouth every 2 (two) hours.   Yes [provider]  amLODipine (NORVASC) 5 MG tablet  04/29/17  Yes [provider]  atorvastatin (LIPITOR) 10 MG tablet  05/01/17  Yes [provider]  carvedilol (COREG) 12.5 MG tablet Take 12.5 mg by  mouth 2 (two) times daily.  11/06/16  Yes [provider]  cloNIDine (CATAPRES) 0.1 MG tablet Take 0.1 mg by mouth 2 (two) times daily.   Yes [provider]  cyanocobalamin 1000 MCG tablet Take 1 tablet (1,000 mcg total) by mouth daily. 06/23/17  Yes Demetrios Loll, MD  doxazosin (CARDURA) 8 MG tablet Take 8 mg by mouth at bedtime.     Yes [provider]  ferrous sulfate 325 (65 FE) MG tablet Take 1 tablet (325 mg total) by mouth 2 (two) times daily with a meal. 06/22/17  Yes Demetrios Loll, MD  gabapentin (NEURONTIN) 100 MG capsule Take by mouth. 12/06/16  Yes [provider]  hydrALAZINE (APRESOLINE) 100 MG tablet Take 100 mg by mouth 3 (three) times daily.   Yes [provider]  ibuprofen (ADVIL,MOTRIN) 200 MG tablet Take 200 mg by mouth every 6 (six) hours as needed.   Yes [provider]  losartan (COZAAR) 100 MG tablet Take 100 mg by mouth daily.    Yes [provider]  HYDROcodone-acetaminophen (NORCO/VICODIN) 5-325 MG tablet Take 1 tablet by mouth every 6 (six) hours as needed for moderate pain. 06/22/17   Demetrios Loll, MD  pantoprazole (PROTONIX) 40 MG tablet Take 1 tablet (40 mg total) by mouth 2 (two) times daily before a meal. Patient not taking: Reported on 06/05/2018 06/22/17   Demetrios Loll,  MD  potassium chloride SA (K-DUR,KLOR-CON) 20 MEQ tablet Take 1 tablet by mouth daily. 05/27/17 05/27/18  [provider]  verapamil (CALAN-SR) 180 MG CR tablet Take 180 mg by mouth at bedtime.    [provider]  Vitamin D, Ergocalciferol, (DRISDOL) 50000 units CAPS capsule Take 1 capsule (50,000 Units total) by mouth weekly for 12 weeks. Patient not taking: Reported on 07/10/2017 03/20/17   Vevelyn Francois, NP    Allergies as of 03/20/2018 - Review Complete 07/10/2017  Allergen Reaction Noted  . Ace inhibitors Other (See Comments) 12/06/2016  . Hydrochlorothiazide Other (See Comments) 07/16/2005  . Lisinopril Other (See Comments)  12/06/2016  . Nsaids Other (See Comments) 12/06/2016  . Ramipril Other (See Comments)     Family History  Problem Relation Age of Onset  . Fibromyalgia Mother   . Diabetes Neg Hx     Social History   Socioeconomic History  . Marital status: Married    Spouse name: Not on file  . Number of children: 2  . Years of education: Not on file  . Highest education level: Not on file  Occupational History  . Occupation: retired  Scientific laboratory technician  . Financial resource strain: Not on file  . Food insecurity:    Worry: Not on file    Inability: Not on file  . Transportation needs:    Medical: Not on file    Non-medical: Not on file  Tobacco Use  . Smoking status: Former Smoker    Last attempt to quit: 09/02/1978    Years since quitting: 39.7  . Smokeless tobacco: Never Used  Substance and Sexual Activity  . Alcohol use: No  . Drug use: No  . Sexual activity: Never  Lifestyle  . Physical activity:    Days per week: Not on file    Minutes per session: Not on file  . Stress: Not on file  Relationships  . Social connections:    Talks on phone: Not on file    Gets together: Not on file    Attends religious service: Not on file    Active member of club or organization: Not on file    Attends meetings of clubs or organizations: Not on file    Relationship status: Not on file  . Intimate partner violence:    Fear of current or ex partner: Not on file    Emotionally abused: Not on file    Physically abused: Not on file    Forced sexual activity: Not on file  Other Topics Concern  . Not on file  Social History Narrative   Enjoys violin    Review of Systems: See HPI, otherwise negative ROS  Physical Exam: BP (!) 163/94   Pulse (!) 59   Temp (!) 97 F (36.1 C) (Tympanic)   Resp 18   Ht 5\' 4"  (1.626 m)   Wt 90.7 kg   SpO2 98%   BMI 34.33 kg/m  General:   Alert,  pleasant and cooperative in NAD Head:  Normocephalic and atraumatic. Neck:  Supple; no masses or  thyromegaly. Lungs:  Clear throughout to auscultation.    Heart:  Regular rate and rhythm. Abdomen:  Soft, nontender and nondistended. Normal bowel sounds, without guarding, and without rebound.   Neurologic:  Alert and  oriented x4;  grossly normal neurologically.  Impression/Plan: [name] is here for an colonoscopy to be performed for [facility] colon polyp.  Risks, benefits, limitations, and alternatives regarding  colonoscopy have been  reviewed with the patient.  Questions have been answered.  All parties agreeable.   Gaylyn Cheers, MD  06/05/2018, 9:58 AM

## 2018-06-05 NOTE — Anesthesia Post-op Follow-up Note (Signed)
Anesthesia QCDR form completed.        

## 2018-06-05 NOTE — Anesthesia Preprocedure Evaluation (Signed)
Anesthesia Evaluation  Patient identified by MRN, date of birth, ID band Patient awake    Reviewed: Allergy & Precautions, H&P , NPO status , Patient's Chart, lab work & pertinent test results, reviewed documented beta blocker date and time   History of Anesthesia Complications Negative for: history of anesthetic complications  Airway Mallampati: II  TM Distance: >3 FB Neck ROM: full    Dental  (+) Dental Advidsory Given, Caps, Missing, Poor Dentition   Pulmonary neg pulmonary ROS, former smoker,           Cardiovascular Exercise Tolerance: Good hypertension, (-) angina(-) CAD, (-) Past MI, (-) Cardiac Stents and (-) CABG (-) dysrhythmias + Valvular Problems/Murmurs      Neuro/Psych  Headaches, neg Seizures PSYCHIATRIC DISORDERS Anxiety Depression  Neuromuscular disease CVA, Residual Symptoms    GI/Hepatic PUD, neg GERD  ,(+)     substance abuse  alcohol use,   Endo/Other  negative endocrine ROS  Renal/GU negative Renal ROS  negative genitourinary   Musculoskeletal   Abdominal   Peds  Hematology negative hematology ROS (+)   Anesthesia Other Findings Past Medical History: No date: Alcoholism ([place])     Comment:  still drinking wine as of Feb 2013 No date: Anxiety and depression No date: Cataract No date: Cervical spondylosis without myelopathy No date: Cervicalgia of occipito-atlanto-axial region No date: Chronic pain disorder No date: Degeneration of cervical intervertebral disc 2011: Duodenal ulcer No date: Facet arthropathy, cervical No date: Fuchs' corneal dystrophy 2011: Gastric ulcer     Comment:  egd at [place] Aug 2011. No date: GI bleed No date: Gout No date: Hypertension No date: Impaired fasting glucose No date: Intractable 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 status  migrainosus No date: LVH (left ventricular hypertrophy) No date: Mitral insufficiency No date: Neck  pain No date: Neuralgia and neuritis No date: Nocturia No date: Occipital neuralgia No date: Osteoarthritis     Comment:  neck No date: Osteoarthritis No date: Pure hypercholesterolemia No date: Radiculitis No date: Stroke ([place]) No date: Tricuspid insufficiency   Reproductive/Obstetrics negative OB ROS                             Anesthesia Physical  Anesthesia Plan  ASA: III  Anesthesia Plan: General   Post-op Pain Management:    Induction: Intravenous  PONV Risk Score and Plan: 2 and Propofol infusion and TIVA  Airway Management Planned: Nasal Cannula and Natural Airway  Additional Equipment:   Intra-op Plan:   Post-operative Plan:   Informed Consent: I have reviewed the patients History and Physical, chart, labs and discussed the procedure including the risks, benefits and alternatives for the proposed anesthesia with the patient or authorized representative who has indicated his/her understanding and acceptance.   Dental Advisory Given  Plan Discussed with: Anesthesiologist, CRNA and Surgeon  Anesthesia Plan Comments:         Anesthesia Quick Evaluation

## 2018-06-06 ENCOUNTER — Encounter: Payer: Self-pay | Admitting: Unknown Physician Specialty

## 2018-06-07 ENCOUNTER — Encounter: Payer: Self-pay | Admitting: Unknown Physician Specialty

## 2018-06-07 NOTE — Anesthesia Postprocedure Evaluation (Signed)
Anesthesia Post Note  Patient: Carl [name]  Procedure(s) Performed: COLONOSCOPY WITH PROPOFOL (N/A )  Patient location during evaluation: Endoscopy Anesthesia Type: General Level of consciousness: awake and alert Pain management: pain level controlled Vital Signs Assessment: post-procedure vital signs reviewed and stable Respiratory status: spontaneous breathing, nonlabored ventilation, respiratory function stable and patient connected to nasal cannula oxygen Cardiovascular status: blood pressure returned to baseline and stable Postop Assessment: no apparent nausea or vomiting Anesthetic complications: no     Last Vitals:  Vitals:   06/05/18 1040 06/05/18 1050  BP: (!) 161/97 (!) 163/101  Pulse: 64 66  Resp: 14 20  Temp:    SpO2: 97% 94%    Last Pain:  Vitals:   06/06/18 0958  TempSrc:   PainSc: 0-No pain                 Martha Clan

## 2018-07-09 ENCOUNTER — Ambulatory Visit: Payer: Medicare Other | Attending: Nurse Practitioner | Admitting: Nurse Practitioner

## 2018-07-09 ENCOUNTER — Encounter: Payer: Self-pay | Admitting: Nurse Practitioner

## 2018-07-09 ENCOUNTER — Other Ambulatory Visit: Payer: Self-pay

## 2018-07-09 VITALS — BP 124/81 | HR 74 | Temp 97.6°F | Ht 65.0 in | Wt 198.0 lb

## 2018-07-09 DIAGNOSIS — G894 Chronic pain syndrome: Secondary | ICD-10-CM

## 2018-07-09 DIAGNOSIS — Z87891 Personal history of nicotine dependence: Secondary | ICD-10-CM | POA: Diagnosis not present

## 2018-07-09 DIAGNOSIS — Z8269 Family history of other diseases of the musculoskeletal system and connective tissue: Secondary | ICD-10-CM | POA: Insufficient documentation

## 2018-07-09 DIAGNOSIS — I10 Essential (primary) hypertension: Secondary | ICD-10-CM | POA: Insufficient documentation

## 2018-07-09 DIAGNOSIS — M5031 Other cervical disc degeneration,  high cervical region: Secondary | ICD-10-CM | POA: Diagnosis not present

## 2018-07-09 DIAGNOSIS — K922 Gastrointestinal hemorrhage, unspecified: Secondary | ICD-10-CM | POA: Insufficient documentation

## 2018-07-09 DIAGNOSIS — M1990 Unspecified osteoarthritis, unspecified site: Secondary | ICD-10-CM | POA: Diagnosis not present

## 2018-07-09 DIAGNOSIS — E7800 Pure hypercholesterolemia, unspecified: Secondary | ICD-10-CM | POA: Insufficient documentation

## 2018-07-09 DIAGNOSIS — R51 Headache: Secondary | ICD-10-CM | POA: Diagnosis not present

## 2018-07-09 DIAGNOSIS — M47812 Spondylosis without myelopathy or radiculopathy, cervical region: Secondary | ICD-10-CM | POA: Diagnosis not present

## 2018-07-09 DIAGNOSIS — F10239 Alcohol dependence with withdrawal, unspecified: Secondary | ICD-10-CM | POA: Diagnosis not present

## 2018-07-09 DIAGNOSIS — M899 Disorder of bone, unspecified: Secondary | ICD-10-CM

## 2018-07-09 DIAGNOSIS — G4700 Insomnia, unspecified: Secondary | ICD-10-CM | POA: Insufficient documentation

## 2018-07-09 DIAGNOSIS — R519 Headache, unspecified: Secondary | ICD-10-CM

## 2018-07-09 DIAGNOSIS — Z79899 Other long term (current) drug therapy: Secondary | ICD-10-CM

## 2018-07-09 DIAGNOSIS — M5481 Occipital neuralgia: Secondary | ICD-10-CM

## 2018-07-09 DIAGNOSIS — M542 Cervicalgia: Secondary | ICD-10-CM | POA: Diagnosis not present

## 2018-07-09 DIAGNOSIS — G8929 Other chronic pain: Secondary | ICD-10-CM | POA: Insufficient documentation

## 2018-07-09 DIAGNOSIS — Z789 Other specified health status: Secondary | ICD-10-CM

## 2018-07-09 NOTE — Patient Instructions (Addendum)
____________________________________________________________________________________________  Appointment Policy Summary  It is our goal and responsibility to provide the medical community with assistance in the evaluation and management of patients with chronic pain. Unfortunately our resources are limited. Because we do not have an unlimited amount of time, or available appointments, we are required to closely monitor and manage their use. The following rules exist to maximize their use:  Patient's responsibilities: 1. Punctuality:  At what time should I arrive? You should be physically present in our office 30 minutes before your scheduled appointment. Your scheduled appointment is with your assigned healthcare provider. However, it takes 5-10 minutes to be "checked-in", and another 15 minutes for the nurses to do the admission. If you arrive to our office at the time you were given for your appointment, you will end up being at least 20-25 minutes late to your appointment with the provider. 2. Tardiness:  What happens if I arrive only a few minutes after my scheduled appointment time? You will need to reschedule your appointment. The cutoff is your appointment time. This is why it is so important that you arrive at least 30 minutes before that appointment. If you have an appointment scheduled for 10:00 AM and you arrive at 10:01, you will be required to reschedule your appointment.  3. Plan ahead:  Always assume that you will encounter traffic on your way in. Plan for it. If you are dependent on a driver, make sure they understand these rules and the need to arrive early. 4. Other appointments and responsibilities:  Avoid scheduling any other appointments before or after your pain clinic appointments.  5. Be prepared:  Write down everything that you need to discuss with your healthcare provider and give this information to the admitting nurse. Write down the medications that you will need  refilled. Bring your pills and bottles (even the empty ones), to all of your appointments, except for those where a procedure is scheduled. 6. No children or pets:  Find someone to take care of them. It is not appropriate to bring them in. 7. Scheduling changes:  We request "advanced notification" of any changes or cancellations. 8. Advanced notification:  Defined as a time period of more than 24 hours prior to the originally scheduled appointment. This allows for the appointment to be offered to other patients. 9. Rescheduling:  When a visit is rescheduled, it will require the cancellation of the original appointment. For this reason they both fall within the category of "Cancellations".  10. Cancellations:  They require advanced notification. Any cancellation less than 24 hours before the  appointment will be recorded as a "No Show". 11. No Show:  Defined as an unkept appointment where the patient failed to notify or declare to the practice their intention or inability to keep the appointment.  Corrective process for repeat offenders:  1. Tardiness: Three (3) episodes of rescheduling due to late arrivals will be recorded as one (1) "No Show". 2. Cancellation or reschedule: Three (3) cancellations or rescheduling will be recorded as one (1) "No Show". 3. "No Shows": Three (3) "No Shows" within a 12 month period will result in discharge from the practice. ____________________________________________________________________________________________  ____________________________________________________________________________________________  Pain Scale  Introduction: The pain score used by this practice is the Verbal Numerical Rating Scale (VNRS-11). This is an 11-point scale. It is for adults and children 10 years or older. There are significant differences in how the pain score is reported, used, and applied. Forget everything you learned in the past and learn  this scoring system.  General  Information: The scale should reflect your current level of pain. Unless you are specifically asked for the level of your worst pain, or your average pain. If you are asked for one of these two, then it should be understood that it is over the past 24 hours.  Basic Activities of Daily Living (ADL): Personal hygiene, dressing, eating, transferring, and using restroom.  Instructions: Most patients tend to report their level of pain as a combination of two factors, their physical pain and their psychosocial pain. This last one is also known as "suffering" and it is reflection of how physical pain affects you socially and psychologically. From now on, report them separately. From this point on, when asked to report your pain level, report only your physical pain. Use the following table for reference.  Pain Clinic Pain Levels (0-5/10)  Pain Level Score  Description  No Pain 0   Mild pain 1 Nagging, annoying, but does not interfere with basic activities of daily living (ADL). Patients are able to eat, bathe, get dressed, toileting (being able to get on and off the toilet and perform personal hygiene functions), transfer (move in and out of bed or a chair without assistance), and maintain continence (able to control bladder and bowel functions). Blood pressure and heart rate are unaffected. A normal heart rate for a healthy adult ranges from 60 to 100 bpm (beats per minute).   Mild to moderate pain 2 Noticeable and distracting. Impossible to hide from other people. More frequent flare-ups. Still possible to adapt and function close to normal. It can be very annoying and may have occasional stronger flare-ups. With discipline, patients may get used to it and adapt.   Moderate pain 3 Interferes significantly with activities of daily living (ADL). It becomes difficult to feed, bathe, get dressed, get on and off the toilet or to perform personal hygiene functions. Difficult to get in and out of bed or a chair  without assistance. Very distracting. With effort, it can be ignored when deeply involved in activities.   Moderately severe pain 4 Impossible to ignore for more than a few minutes. With effort, patients may still be able to manage work or participate in some social activities. Very difficult to concentrate. Signs of autonomic nervous system discharge are evident: dilated pupils (mydriasis); mild sweating (diaphoresis); sleep interference. Heart rate becomes elevated (>115 bpm). Diastolic blood pressure (lower number) rises above 100 mmHg. Patients find relief in laying down and not moving.   Severe pain 5 Intense and extremely unpleasant. Associated with frowning face and frequent crying. Pain overwhelms the senses.  Ability to do any activity or maintain social relationships becomes significantly limited. Conversation becomes difficult. Pacing back and forth is common, as getting into a comfortable position is nearly impossible. Pain wakes you up from deep sleep. Physical signs will be obvious: pupillary dilation; increased sweating; goosebumps; brisk reflexes; cold, clammy hands and feet; nausea, vomiting or dry heaves; loss of appetite; significant sleep disturbance with inability to fall asleep or to remain asleep. When persistent, significant weight loss is observed due to the complete loss of appetite and sleep deprivation.  Blood pressure and heart rate becomes significantly elevated. Caution: If elevated blood pressure triggers a pounding headache associated with blurred vision, then the patient should immediately seek attention at an urgent or emergency care unit, as these may be signs of an impending stroke.    Emergency Department Pain Levels (6-10/10)  Emergency Room Pain 6 Severely   limiting. Requires emergency care and should not be seen or managed at an outpatient pain management facility. Communication becomes difficult and requires great effort. Assistance to reach the emergency department  may be required. Facial flushing and profuse sweating along with potentially dangerous increases in heart rate and blood pressure will be evident.   Distressing pain 7 Self-care is very difficult. Assistance is required to transport, or use restroom. Assistance to reach the emergency department will be required. Tasks requiring coordination, such as bathing and getting dressed become very difficult.   Disabling pain 8 Self-care is no longer possible. At this level, pain is disabling. The individual is unable to do even the most "basic" activities such as walking, eating, bathing, dressing, transferring to a bed, or toileting. Fine motor skills are lost. It is difficult to think clearly.   Incapacitating pain 9 Pain becomes incapacitating. Thought processing is no longer possible. Difficult to remember your own name. Control of movement and coordination are lost.   The worst pain imaginable 10 At this level, most patients pass out from pain. When this level is reached, collapse of the autonomic nervous system occurs, leading to a sudden drop in blood pressure and heart rate. This in turn results in a temporary and dramatic drop in blood flow to the brain, leading to a loss of consciousness. Fainting is one of the body's self defense mechanisms. Passing out puts the brain in a calmed state and causes it to shut down for a while, in order to begin the healing process.    Summary: 1. Refer to this scale when providing [place] with your pain level. 2. Be accurate and careful when reporting your pain level. This will help with your care. 3. Over-reporting your pain level will lead to loss of credibility. 4. Even a level of 1/10 means that there is pain and will be treated at our facility. 5. High, inaccurate reporting will be documented as "Symptom Exaggeration", leading to loss of credibility and suspicions of possible secondary gains such as obtaining more narcotics, or wanting to appear disabled, for  fraudulent reasons. 6. Only pain levels of 5 or below will be seen at our facility. 7. Pain levels of 6 and above will be sent to the Emergency Department and the appointment cancelled. ____________________________________________________________________________________________    BMI Assessment: Estimated body mass index is 32.95 kg/m as calculated from the following:   Height as of this encounter: 5\' 5"  (1.651 m).   Weight as of this encounter: 198 lb (89.8 kg).  BMI interpretation table: BMI level Category Range association with higher incidence of chronic pain  <18 kg/m2 Underweight   18.5-24.9 kg/m2 Ideal body weight   25-29.9 kg/m2 Overweight Increased incidence by 20%  30-34.9 kg/m2 Obese (Class I) Increased incidence by 68%  35-39.9 kg/m2 Severe obesity (Class II) Increased incidence by 136%  >40 kg/m2 Extreme obesity (Class III) Increased incidence by 254%   Patient's current BMI Ideal Body weight  Body mass index is 32.95 kg/m. Ideal body weight: 61.5 kg (135 lb 9.3 oz) Adjusted ideal body weight: 72.8 kg (160 lb 8.8 oz)   BMI Readings from Last 4 Encounters:  07/09/18 32.95 kg/m  06/05/18 34.33 kg/m  07/10/17 28.21 kg/m  06/20/17 32.27 kg/m   Wt Readings from Last 4 Encounters:  07/09/18 198 lb (89.8 kg)  06/05/18 200 lb (90.7 kg)  07/10/17 185 lb 8 oz (84.1 kg)  06/20/17 188 lb (85.3 kg)

## 2018-07-09 NOTE — Progress Notes (Signed)
Patient's Name: Carl [name]  MRN: [id number]  Referring Provider: Hortencia Pilar, MD  DOB: [birth year]  PCP: Hortencia Pilar, MD  DOS: 07/09/2018  Note by: Vevelyn Francois NP  Service setting: Ambulatory outpatient  Specialty: Interventional Pain Management  Location: [facility] (AMB) Pain Management Facility    Patient type: Established    Primary Reason(s) for Visit: Evaluation of chronic illnesses with exacerbation, or progression (Level of risk: moderate) CC: No chief complaint on file.  HPI  Carl [name] is a 72 y.o. year old, male patient, who comes today for a follow-up evaluation. He has GOUT; ALCOHOL WITHDRAWAL; ALCOHOL ABUSE; ANXIETY, SITUATIONAL; Essential hypertension; LUNG NODULE; UNSPECIFIED PROSTATITIS; CELLULITIS, HAND, LEFT; CONTACT DERMATITIS&OTHER ECZEMA DUE TO PLANTS; Osteoarthrosis and allied disorders; CHEST PAIN; IMPAIRED FASTING GLUCOSE; NECK SPRAIN AND STRAIN; Thalamic pain syndrome; Adjustment disorder with mixed disturbance of emotions and conduct; Alcohol dependence in remission ([place]); Anxiety and depression; Cataracts, bilateral; Cervical spondylosis without myelopathy; Cervicalgia of occipito-atlanto-axial region; Chronic pain disorder; Cognitive disorder; DDD (degenerative disc disease), cervical; Depression, major, single episode, moderate ([place]); Facet arthropathy, cervical; Fuchs' corneal dystrophy; GI bleed; History of stroke; Intractable 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; LVH (left ventricular hypertrophy); Mitral insufficiency; Neck pain (primary) (left; Neuralgia, neuritis, and radiculitis, unspecified; Nocturia; Occipital neuralgia; Persistent insomnia; Pure hypercholesterolemia; Right sided weakness; Slurred speech; Tricuspid insufficiency; Word finding difficulty; Opiate use; Chronic headaches; Chronic pain of both knees; Left facial pain (secondary); Occipital neuralgia of left side (Secondary Area of Pain); GIB (gastrointestinal bleeding); Melena;  Acute esophagogastric ulcer; Duodenum ulcer; Abnormal behavior; History of adenomatous polyp of colon; History of upper gastrointestinal bleeding; Mild cognitive impairment; Chronic neck pain (Primary Area of Pain) (L); Chronic pain syndrome; Pharmacologic therapy; Disorder of skeletal system; Problems influencing health status; and Chronic facial pain (Primary Area of Pain) (L) on their problem list. Carl [name] was last seen on Visit date not found. His primarily concern today is the No chief complaint on file.  Pain Assessment: Severity: 4 /10 (subjective, self-reported pain score)  Note: Reported level is compatible with observation.                          BP: 124/81  HR: 74  Further details on both, my assessment(s), as well as the proposed treatment plan, please see below.  Patient is in today for reevaluation.  He was seen on March 03, 2017 for initial evaluation.  He admits that he continues to have left-sided neck pain that radiates up into his head and into the side of his face into his left eye.  He denies any current blurry vision but does feel like his vision is changed over time.  Denies any previous surgery.  He has had multiple nerve blocks along with Botox injections which she states was not effective.  He admits that he does see the chiropractor twice a month which is somewhat effective.  He did have a CT scan of his neck and he had in April 2018.  He did not have the recommended MRI ordered by our office.  Laboratory Chemistry  Inflammation Markers (CRP: Acute Phase) (ESR: Chronic Phase) Lab Results  Component Value Date   CRP 0.3 03/03/2017   ESRSEDRATE 2 03/03/2017                         Rheumatology Markers Lab Results  Component Value Date   LABURIC 4.6 03/03/2017   LYMEABIGMQN <  0.80 03/03/2017                        Renal Function Markers Lab Results  Component Value Date   BUN 41 (H) 06/20/2017   CREATININE 1.25 (H) 06/20/2017   BCR 11 03/03/2017   GFRAA >60  06/20/2017   GFRNONAA 57 (L) 06/20/2017                             Hepatic Function Markers Lab Results  Component Value Date   AST 27 06/19/2017   ALT 18 06/19/2017   ALBUMIN 3.6 06/19/2017   ALKPHOS 28 (L) 06/19/2017   AMMONIA 17 10/14/2011                        Electrolytes Lab Results  Component Value Date   NA 148 (H) 06/20/2017   K 3.9 06/20/2017   CL 116 (H) 06/20/2017   CALCIUM 8.2 (L) 06/20/2017   MG 1.7 06/22/2017   PHOS 2.6 10/10/2011                        Neuropathy Markers Lab Results  Component Value Date   VITAMINB12 154 (L) 06/21/2017   FOLATE 4.6 04/24/2009   HGBA1C 5.3 10/12/2011                        CNS Tests No results found for: COLORCSF, APPEARCSF, RBCCOUNTCSF, WBCCSF, POLYSCSF, LYMPHSCSF, EOSCSF, PROTEINCSF, GLUCCSF, JCVIRUS, CSFOLI, IGGCSF                      Bone Pathology Markers Lab Results  Component Value Date   VD25OH 39.2 06/21/2017   25OHVITD1 13 (L) 03/03/2017   25OHVITD2 1.2 03/03/2017   25OHVITD3 12 03/03/2017                         Coagulation Parameters Lab Results  Component Value Date   INR 1.06 06/19/2017   LABPROT 13.7 06/19/2017   APTT 27 06/19/2017   PLT 167 06/21/2017                        Cardiovascular Markers Lab Results  Component Value Date   TROPONINI <0.30 10/08/2011   HGB 8.5 (L) 06/22/2017   HCT 21.9 (L) 06/21/2017   HCT 22.4 (L) 06/21/2017                         CA Markers No results found for: CEA, CA125, LABCA2                      Note: Lab results reviewed.  Recent Diagnostic Imaging Review  Cervical Imaging:  Cervical CT wo contrast:  Results for orders placed during the hospital encounter of 12/06/16  CT Cervical Spine Wo Contrast   Narrative CLINICAL DATA:  Headache for 4 years, gradually worsening. Left neck pain. Occipital pain.  EXAM: CT HEAD WITHOUT CONTRAST  CT CERVICAL SPINE WITHOUT CONTRAST  TECHNIQUE: Multidetector CT imaging of the head and cervical spine  was performed following the standard protocol without intravenous contrast. Multiplanar CT image reconstructions of the cervical spine were also generated.  COMPARISON:  10/15/2011 head CT  FINDINGS: CT HEAD FINDINGS  Brain: No evidence of acute  infarction, hemorrhage, hydrocephalus, extra-axial collection or mass lesion/mass effect.  Chronic microvascular disease with mild for age ischemic gliosis in the cerebral white matter. Cavity in the left thalamus which was related to hemorrhage in 2013. There is more lateral and inferior lacune. Normal brain volume.  Vascular: Atherosclerotic calcification. No hyperdense vessel suspected.  Skull: No acute finding  Sinuses/Orbits: Negative globes.  CT CERVICAL SPINE FINDINGS  Alignment: Straightening with mild anterolisthesis at C3-4, facet mediated.  Skull base and vertebrae: No acute fracture. No primary bone lesion or focal pathologic process.  Soft tissues and spinal canal: No evidence of inflammation or mass. No evidence of epidural hematoma. 15 mm left thyroid nodule.  Disc levels: Degenerative disc narrowing that is advanced from C3-4 to C6-7, with spurring. Uncovertebral spurs narrow the bilateral foramina, up to moderate at C3-4. No evidence of cord impingement. Facet arthropathy with spurring greatest at C3-4.  Upper chest: No acute finding  IMPRESSION: 1. No acute intracranial or cervical spine finding. 2. 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 3. Advanced cervical disc degeneration from C3-4 to C6-7. Uncovertebral spurs cause up to moderate foraminal narrowing on the left at C3-4. 4. 15 mm left thyroid nodule, at the size threshold where outpatient follow-up sonography is recommended.   Electronically Signed   By: Monte Fantasia M.D.   On: 12/06/2016 14:57   Knee Imaging:  Knee-R DG 1-2 views:  Results for orders placed during the hospital encounter of 10/08/11  DG  Knee 1-2 Views Right   Narrative *RADIOLOGY REPORT*  Clinical Data: Pain and swelling  RIGHT KNEE - 1-2 VIEW  Comparison: None.  Findings: There is a moderately large effusion in the suprapatellar bursa.  Negative for fracture, dislocation, or other acute bony abnormality.  No significant osseous degenerative change.  Normal mineralization and alignment.  IMPRESSION:  1.  Effusion without fracture or other acute bony abnormality.  Original Report Authenticated By: Trecia Rogers, M.D.  Wrist Imaging: Wrist-R DG Complete:  Results for orders placed during the hospital encounter of 10/08/11  DG Wrist Complete Right   Narrative *RADIOLOGY REPORT*  Clinical Data: Pain and swelling  RIGHT WRIST - COMPLETE 3+ VIEW  Comparison: None.  Findings: Carpal rows intact. Negative for fracture, dislocation, or other acute abnormality.  Normal alignment and mineralization. No significant degenerative change.  There is dorsal soft tissue swelling.  IMPRESSION:  Dorsal soft tissue swelling without fracture or other bony abnormality  Original Report Authenticated By: Trecia Rogers, M.D.    Complexity Note: Imaging results reviewed. Results shared with Mr. Mark, using Layman's terms.                         Meds   Current Outpatient Medications:  .  acetaminophen (TYLENOL) 500 MG tablet, 500 mg every 6 (six) hours as needed for Pain., Disp: , Rfl:  .  amLODipine (NORVASC) 10 MG tablet, Take by mouth., Disp: , Rfl:  .  amLODipine (NORVASC) 5 MG tablet, , Disp: , Rfl: 0 .  atorvastatin (LIPITOR) 10 MG tablet, , Disp: , Rfl: 2 .  carvedilol (COREG) 12.5 MG tablet, Take 12.5 mg by mouth 2 (two) times daily. , Disp: , Rfl: 0 .  cloNIDine (CATAPRES) 0.1 MG tablet, Take by mouth., Disp: , Rfl:  .  doxazosin (CARDURA) 8 MG tablet, Take 8 mg by mouth at bedtime.  , Disp: , Rfl:  .  DULoxetine (CYMBALTA) 30 MG  capsule, Take by mouth., Disp: , Rfl:  .  ferrous sulfate 325  (65 FE) MG tablet, Take 1 tablet (325 mg total) by mouth 2 (two) times daily with a meal., Disp: 60 tablet, Rfl: 1 .  gabapentin (NEURONTIN) 100 MG capsule, Take by mouth., Disp: , Rfl:  .  hydrALAZINE (APRESOLINE) 100 MG tablet, Take 100 mg by mouth 3 (three) times daily., Disp: , Rfl:  .  losartan (COZAAR) 100 MG tablet, Take 100 mg by mouth daily. , Disp: , Rfl:  .  pantoprazole (PROTONIX) 40 MG tablet, Take 1 tablet (40 mg total) by mouth 2 (two) times daily before a meal., Disp: 60 tablet, Rfl: 2 .  polyethylene glycol-electrolytes (NULYTELY/GOLYTELY) 420 g solution, TAKE 4 000 MLS BY MOUTH ONCE. TAKE AS WRITTEN FOR COLONIC PREP., Disp: , Rfl: 0 .  potassium chloride SA (K-DUR,KLOR-CON) 20 MEQ tablet, Take by mouth., Disp: , Rfl:  .  Vitamin D, Ergocalciferol, (DRISDOL) 1.25 MG (50000 UT) CAPS capsule, Take 1 capsule (50,000 Units total) by mouth weekly for 12 weeks., Disp: , Rfl:  .  potassium chloride SA (K-DUR,KLOR-CON) 20 MEQ tablet, Take 1 tablet by mouth daily., Disp: , Rfl:   ROS  Constitutional: Denies any fever or chills Gastrointestinal: No reported hemesis, hematochezia, vomiting, or acute GI distress Musculoskeletal: Denies any acute onset joint swelling, redness, loss of ROM, or weakness Neurological: No reported episodes of acute onset apraxia, aphasia, dysarthria, agnosia, amnesia, paralysis, loss of coordination, or loss of consciousness  Allergies  Mr. Clutter is allergic to ace inhibitors; hydrochlorothiazide; lisinopril; nsaids; and ramipril.  PFSH  Drug: Mr. Meldrum  reports that he does not use drugs. Alcohol:  reports that he does not drink alcohol. Tobacco:  reports that he quit smoking about 39 years ago. He has never used smokeless tobacco. Medical:  has a past medical history of Adenoma of colon, Alcoholism ([place]), Anxiety and depression, Cataract, Cervical spondylosis without myelopathy, Cervicalgia of occipito-atlanto-axial region, Chronic pain disorder,  Degeneration of cervical intervertebral disc, Degeneration, intervertebral disc, cervical, Duodenal ulcer (2011), Facet arthropathy, cervical, Fuchs' corneal dystrophy, Gastric ulcer (2011), GI bleed, Gout, Hypertension, Impaired fasting glucose, Intractable 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, LVH (left ventricular hypertrophy), Mild cognitive impairment, Mitral insufficiency, Neck pain, Neuralgia and neuritis, Nocturia, Occipital neuralgia, Osteoarthritis, Osteoarthritis, Pure hypercholesterolemia, Radiculitis, Stroke ([place]), and Tricuspid insufficiency. Surgical: Mr. Siebert  has a past surgical history that includes Cystogram (03/08); Esophagogastroduodenoscopy (egd) with propofol (N/A, 06/20/2017); and Colonoscopy with propofol (N/A, 06/05/2018). Family: family history includes Fibromyalgia in his mother.  Constitutional Exam  General appearance: Well nourished, well developed, and well hydrated. In no apparent acute distress Vitals:   07/09/18 0949  BP: 124/81  Pulse: 74  Temp: 97.6 F (36.4 C)  SpO2: 96%  Weight: 198 lb (89.8 kg)  Height: '5\' 5"'$  (1.651 m)   BMI Assessment: Estimated body mass index is 32.95 kg/m as calculated from the following:   Height as of this encounter: '5\' 5"'$  (1.651 m).   Weight as of this encounter: 198 lb (89.8 kg).  BMI interpretation table: BMI level Category Range association with higher incidence of chronic pain  <18 kg/m2 Underweight   18.5-24.9 kg/m2 Ideal body weight   25-29.9 kg/m2 Overweight Increased incidence by 20%  30-34.9 kg/m2 Obese (Class I) Increased incidence by 68%  35-39.9 kg/m2 Severe obesity (Class II) Increased incidence by 136%  >40 kg/m2 Extreme obesity (Class III) Increased incidence by 254%   Patient's current BMI Ideal Body  weight  Body mass index is 32.95 kg/m. Ideal body weight: 61.5 kg (135 lb 9.3 oz) Adjusted ideal body weight: 72.8 kg (160 lb 8.8 oz)   BMI Readings from Last 4 Encounters:   07/09/18 32.95 kg/m  06/05/18 34.33 kg/m  07/10/17 28.21 kg/m  06/20/17 32.27 kg/m   Wt Readings from Last 4 Encounters:  07/09/18 198 lb (89.8 kg)  06/05/18 200 lb (90.7 kg)  07/10/17 185 lb 8 oz (84.1 kg)  06/20/17 188 lb (85.3 kg)  Psych/Mental status: Alert, oriented x 3 (person, place, & time)       Eyes: PERLA Respiratory: No evidence of acute respiratory distress  Cervical Spine Area Exam  Skin & Axial Inspection: No masses, redness, edema, swelling, or associated skin lesions Alignment: Symmetrical Functional ROM: Adequate ROM      Stability: No instability detected Muscle Tone/Strength: Functionally intact. No obvious neuro-muscular anomalies detected. Sensory (Neurological): Neuropathic pain pattern Palpation: Tender              Upper Extremity (UE) Exam    Side: Right upper extremity  Side: Left upper extremity  Skin & Extremity Inspection: Skin color, temperature, and hair growth are WNL. No peripheral edema or cyanosis. No masses, redness, swelling, asymmetry, or associated skin lesions. No contractures.  Skin & Extremity Inspection: Skin color, temperature, and hair growth are WNL. No peripheral edema or cyanosis. No masses, redness, swelling, asymmetry, or associated skin lesions. No contractures.  Functional ROM: Unrestricted ROM          Functional ROM: Unrestricted ROM          Muscle Tone/Strength: Functionally intact. No obvious neuro-muscular anomalies detected.  Muscle Tone/Strength: Functionally intact. No obvious neuro-muscular anomalies detected.  Sensory (Neurological): Unimpaired          Sensory (Neurological): Unimpaired          Palpation: No palpable anomalies              Palpation: No palpable anomalies               Thoracic Spine Area Exam  Skin & Axial Inspection: No masses, redness, or swelling Alignment: Symmetrical Functional ROM: Unrestricted ROM Stability: No instability detected Muscle Tone/Strength: Functionally intact. No obvious  neuro-muscular anomalies detected. Sensory (Neurological): Unimpaired Muscle strength & Tone: No palpable anomalies  Lumbar Spine Area Exam  Skin & Axial Inspection: No masses, redness, or swelling Alignment: Symmetrical Functional ROM: Unrestricted ROM       Stability: No instability detected Muscle Tone/Strength: Functionally intact. No obvious neuro-muscular anomalies detected. Sensory (Neurological): Unimpaired Palpation: No palpable anomalies       Provocative Tests: Hyperextension/rotation test: deferred today       Lumbar quadrant test (Kemp's test): deferred today       Lateral bending test: deferred today       Patrick's Maneuver: deferred today                   FABER test: deferred today                   S-I anterior distraction/compression test: deferred today         S-I lateral compression test: deferred today         S-I Thigh-thrust test: deferred today         S-I Gaenslen's test: deferred today          Gait & Posture Assessment  Ambulation: Unassisted Gait: Relatively normal for age and  body habitus Posture: WNL   Lower Extremity Exam    Side: Right lower extremity  Side: Left lower extremity  Stability: No instability observed          Stability: No instability observed          Skin & Extremity Inspection: Skin color, temperature, and hair growth are WNL. No peripheral edema or cyanosis. No masses, redness, swelling, asymmetry, or associated skin lesions. No contractures.  Skin & Extremity Inspection: Skin color, temperature, and hair growth are WNL. No peripheral edema or cyanosis. No masses, redness, swelling, asymmetry, or associated skin lesions. No contractures.  Functional ROM: Unrestricted ROM                  Functional ROM: Unrestricted ROM                  Muscle Tone/Strength: Functionally intact. No obvious neuro-muscular anomalies detected.  Muscle Tone/Strength: Functionally intact. No obvious neuro-muscular anomalies detected.  Sensory  (Neurological): Unimpaired  Sensory (Neurological): Unimpaired  Palpation: No palpable anomalies  Palpation: No palpable anomalies   Assessment  Primary Diagnosis & Pertinent Problem List: The primary encounter diagnosis was Chronic neck pain (Primary Area of Pain) (L). Diagnoses of Occipital neuralgia of left side (Secondary Area of Pain), Chronic facial pain (Primary Area of Pain) (L), Chronic pain syndrome, Pharmacologic therapy, Disorder of skeletal system, and Problems influencing health status were also pertinent to this visit.  Status Diagnosis  Persistent Persistent Persistent 1. Chronic neck pain (Primary Area of Pain) (L)   2. Occipital neuralgia of left side (Secondary Area of Pain)   3. Chronic facial pain (Primary Area of Pain) (L)   4. Chronic pain syndrome   5. Pharmacologic therapy   6. Disorder of skeletal system   7. Problems influencing health status     Problems updated and reviewed during this visit: Problem  Chronic neck pain (Primary Area of Pain) (L)  Chronic Pain Syndrome  Chronic facial pain (Primary Area of Pain) (L)  Occipital neuralgia of left side (Secondary Area of Pain)  Pharmacologic Therapy  Disorder of Skeletal System  Problems Influencing Health Status  Abnormal Behavior  History of Adenomatous Polyp of Colon  History of Upper Gastrointestinal Bleeding  Mild Cognitive Impairment   Plan of Care  Pharmacotherapy (Medications Ordered): No orders of the defined types were placed in this encounter.  New Prescriptions   No medications on file   Medications administered today: [name] had no medications administered during this visit. Lab-work, procedure(s), and/or referral(s): Orders Placed This Encounter  Procedures  . Compliance Drug Analysis, Ur  . Comp. Metabolic Panel (12)  . Magnesium  . Vitamin B12  . Sedimentation rate  . 25-Hydroxyvitamin D Lcms D2+D3  . C-reactive protein   Imaging and/or  referral(s): None  Interventional therapies: Planned, scheduled, and/or pending:   Not at this time.   Considering:   Diagnostic left occipital nerve block Possible left occipital nerve radiofrequency ablation Possible left peripheral nerve stimulator trial Diagnostic left C2 + TON Diagnostic left C2 + TON radiofrequency ablation    Palliative PRN treatment(s):   None at this time.   Provider-requested follow-up: Return for 2nd Visit, w/ Dr. Dossie Arbour.  Future Appointments  Date Time Provider [place]  08/05/2018 10:30 AM Milinda Pointer, MD [facility] None   Primary Care Physician: Hortencia Pilar, MD Location: [facility] Note by: Vevelyn Francois NP Date: 07/09/2018; Time: 3:07 PM  Pain Score Disclaimer:  We use the NRS-11 scale. This is a self-reported, subjective measurement of pain severity with only modest accuracy. It is used primarily to identify changes within a particular patient. It must be understood that outpatient pain scales are significantly less accurate that those used for research, where they can be applied under ideal controlled circumstances with minimal exposure to variables. In reality, the score is likely to be a combination of pain intensity and pain affect, where pain affect describes the degree of emotional arousal or changes in action readiness caused by the sensory experience of pain. Factors such as social and work situation, setting, emotional state, anxiety levels, expectation, and prior pain experience may influence pain perception and show large inter-individual differences that may also be affected by time variables.  Patient instructions provided during this appointment: Patient Instructions    ____________________________________________________________________________________________  Appointment Policy Summary  It is our goal and responsibility to provide the medical community with assistance in the  evaluation and management of patients with chronic pain. Unfortunately our resources are limited. Because we do not have an unlimited amount of time, or available appointments, we are required to closely monitor and manage their use. The following rules exist to maximize their use:  Patient's responsibilities: 1. Punctuality:  At what time should I arrive? You should be physically present in our office 30 minutes before your scheduled appointment. Your scheduled appointment is with your assigned healthcare provider. However, it takes 5-10 minutes to be "checked-in", and another 15 minutes for the nurses to do the admission. If you arrive to our office at the time you were given for your appointment, you will end up being at least 20-25 minutes late to your appointment with the provider. 2. Tardiness:  What happens if I arrive only a few minutes after my scheduled appointment time? You will need to reschedule your appointment. The cutoff is your appointment time. This is why it is so important that you arrive at least 30 minutes before that appointment. If you have an appointment scheduled for 10:00 AM and you arrive at 10:01, you will be required to reschedule your appointment.  3. Plan ahead:  Always assume that you will encounter traffic on your way in. Plan for it. If you are dependent on a driver, make sure they understand these rules and the need to arrive early. 4. Other appointments and responsibilities:  Avoid scheduling any other appointments before or after your pain clinic appointments.  5. Be prepared:  Write down everything that you need to discuss with your healthcare provider and give this information to the admitting nurse. Write down the medications that you will need refilled. Bring your pills and bottles (even the empty ones), to all of your appointments, except for those where a procedure is scheduled. 6. No children or pets:  Find someone to take care of them. It is not appropriate  to bring them in. 7. Scheduling changes:  We request "advanced notification" of any changes or cancellations. 8. Advanced notification:  Defined as a time period of more than 24 hours prior to the originally scheduled appointment. This allows for the appointment to be offered to other patients. 9. Rescheduling:  When a visit is rescheduled, it will require the cancellation of the original appointment. For this reason they both fall within the category of "Cancellations".  10. Cancellations:  They require advanced notification. Any cancellation less than 24 hours before the  appointment will be recorded as a "No Show". 11. No Show:  Defined as  an unkept appointment where the patient failed to notify or declare to the practice their intention or inability to keep the appointment.  Corrective process for repeat offenders:  1. Tardiness: Three (3) episodes of rescheduling due to late arrivals will be recorded as one (1) "No Show". 2. Cancellation or reschedule: Three (3) cancellations or rescheduling will be recorded as one (1) "No Show". 3. "No Shows": Three (3) "No Shows" within a 12 month period will result in discharge from the practice. ____________________________________________________________________________________________  ____________________________________________________________________________________________  Pain Scale  Introduction: The pain score used by this practice is the Verbal Numerical Rating Scale (VNRS-11). This is an 11-point scale. It is for adults and children 10 years or older. There are significant differences in how the pain score is reported, used, and applied. Forget everything you learned in the past and learn this scoring system.  General Information: The scale should reflect your current level of pain. Unless you are specifically asked for the level of your worst pain, or your average pain. If you are asked for one of these two, then it should be understood  that it is over the past 24 hours.  Basic Activities of Daily Living (ADL): Personal hygiene, dressing, eating, transferring, and using restroom.  Instructions: Most patients tend to report their level of pain as a combination of two factors, their physical pain and their psychosocial pain. This last one is also known as "suffering" and it is reflection of how physical pain affects you socially and psychologically. From now on, report them separately. From this point on, when asked to report your pain level, report only your physical pain. Use the following table for reference.  Pain Clinic Pain Levels (0-5/10)  Pain Level Score  Description  No Pain 0   Mild pain 1 Nagging, annoying, but does not interfere with basic activities of daily living (ADL). Patients are able to eat, bathe, get dressed, toileting (being able to get on and off the toilet and perform personal hygiene functions), transfer (move in and out of bed or a chair without assistance), and maintain continence (able to control bladder and bowel functions). Blood pressure and heart rate are unaffected. A normal heart rate for a healthy adult ranges from 60 to 100 bpm (beats per minute).   Mild to moderate pain 2 Noticeable and distracting. Impossible to hide from other people. More frequent flare-ups. Still possible to adapt and function close to normal. It can be very annoying and may have occasional stronger flare-ups. With discipline, patients may get used to it and adapt.   Moderate pain 3 Interferes significantly with activities of daily living (ADL). It becomes difficult to feed, bathe, get dressed, get on and off the toilet or to perform personal hygiene functions. Difficult to get in and out of bed or a chair without assistance. Very distracting. With effort, it can be ignored when deeply involved in activities.   Moderately severe pain 4 Impossible to ignore for more than a few minutes. With effort, patients may still be able to  manage work or participate in some social activities. Very difficult to concentrate. Signs of autonomic nervous system discharge are evident: dilated pupils (mydriasis); mild sweating (diaphoresis); sleep interference. Heart rate becomes elevated (>115 bpm). Diastolic blood pressure (lower number) rises above 100 mmHg. Patients find relief in laying down and not moving.   Severe pain 5 Intense and extremely unpleasant. Associated with frowning face and frequent crying. Pain overwhelms the senses.  Ability to do any activity or  maintain social relationships becomes significantly limited. Conversation becomes difficult. Pacing back and forth is common, as getting into a comfortable position is nearly impossible. Pain wakes you up from deep sleep. Physical signs will be obvious: pupillary dilation; increased sweating; goosebumps; brisk reflexes; cold, clammy hands and feet; nausea, vomiting or dry heaves; loss of appetite; significant sleep disturbance with inability to fall asleep or to remain asleep. When persistent, significant weight loss is observed due to the complete loss of appetite and sleep deprivation.  Blood pressure and heart rate becomes significantly elevated. Caution: If elevated blood pressure triggers a pounding headache associated with blurred vision, then the patient should immediately seek attention at an urgent or emergency care unit, as these may be signs of an impending stroke.    Emergency Department Pain Levels (6-10/10)  Emergency Room Pain 6 Severely limiting. Requires emergency care and should not be seen or managed at an outpatient pain management facility. Communication becomes difficult and requires great effort. Assistance to reach the emergency department may be required. Facial flushing and profuse sweating along with potentially dangerous increases in heart rate and blood pressure will be evident.   Distressing pain 7 Self-care is very difficult. Assistance is required to  transport, or use restroom. Assistance to reach the emergency department will be required. Tasks requiring coordination, such as bathing and getting dressed become very difficult.   Disabling pain 8 Self-care is no longer possible. At this level, pain is disabling. The individual is unable to do even the most "basic" activities such as walking, eating, bathing, dressing, transferring to a bed, or toileting. Fine motor skills are lost. It is difficult to think clearly.   Incapacitating pain 9 Pain becomes incapacitating. Thought processing is no longer possible. Difficult to remember your own name. Control of movement and coordination are lost.   The worst pain imaginable 10 At this level, most patients pass out from pain. When this level is reached, collapse of the autonomic nervous system occurs, leading to a sudden drop in blood pressure and heart rate. This in turn results in a temporary and dramatic drop in blood flow to the brain, leading to a loss of consciousness. Fainting is one of the body's self defense mechanisms. Passing out puts the brain in a calmed state and causes it to shut down for a while, in order to begin the healing process.    Summary: 1. Refer to this scale when providing [place] with your pain level. 2. Be accurate and careful when reporting your pain level. This will help with your care. 3. Over-reporting your pain level will lead to loss of credibility. 4. Even a level of 1/10 means that there is pain and will be treated at our facility. 5. High, inaccurate reporting will be documented as "Symptom Exaggeration", leading to loss of credibility and suspicions of possible secondary gains such as obtaining more narcotics, or wanting to appear disabled, for fraudulent reasons. 6. Only pain levels of 5 or below will be seen at our facility. 7. Pain levels of 6 and above will be sent to the Emergency Department and the appointment  cancelled. ____________________________________________________________________________________________    BMI Assessment: Estimated body mass index is 32.95 kg/m as calculated from the following:   Height as of this encounter: '5\' 5"'$  (1.651 m).   Weight as of this encounter: 198 lb (89.8 kg).  BMI interpretation table: BMI level Category Range association with higher incidence of chronic pain  <18 kg/m2 Underweight   18.5-24.9 kg/m2 Ideal body  weight   25-29.9 kg/m2 Overweight Increased incidence by 20%  30-34.9 kg/m2 Obese (Class I) Increased incidence by 68%  35-39.9 kg/m2 Severe obesity (Class II) Increased incidence by 136%  >40 kg/m2 Extreme obesity (Class III) Increased incidence by 254%   Patient's current BMI Ideal Body weight  Body mass index is 32.95 kg/m. Ideal body weight: 61.5 kg (135 lb 9.3 oz) Adjusted ideal body weight: 72.8 kg (160 lb 8.8 oz)   BMI Readings from Last 4 Encounters:  07/09/18 32.95 kg/m  06/05/18 34.33 kg/m  07/10/17 28.21 kg/m  06/20/17 32.27 kg/m   Wt Readings from Last 4 Encounters:  07/09/18 198 lb (89.8 kg)  06/05/18 200 lb (90.7 kg)  07/10/17 185 lb 8 oz (84.1 kg)  06/20/17 188 lb (85.3 kg)

## 2018-07-14 LAB — MAGNESIUM: MAGNESIUM: 1.9 mg/dL (ref 1.6–2.3)

## 2018-07-14 LAB — COMP. METABOLIC PANEL (12)
ALBUMIN: 4.1 g/dL (ref 3.5–4.8)
ALK PHOS: 50 IU/L (ref 39–117)
AST: 17 IU/L (ref 0–40)
Albumin/Globulin Ratio: 1.6 (ref 1.2–2.2)
BILIRUBIN TOTAL: 0.3 mg/dL (ref 0.0–1.2)
BUN/Creatinine Ratio: 21 (ref 10–24)
BUN: 23 mg/dL (ref 8–27)
CREATININE: 1.1 mg/dL (ref 0.76–1.27)
Calcium: 9.4 mg/dL (ref 8.6–10.2)
Chloride: 107 mmol/L — ABNORMAL HIGH (ref 96–106)
GFR calc Af Amer: 78 mL/min/{1.73_m2} (ref >59)
GFR calc non Af Amer: 67 mL/min/{1.73_m2} (ref >59)
Globulin, Total: 2.5 g/dL (ref 1.5–4.5)
Glucose: 102 mg/dL — ABNORMAL HIGH (ref 65–99)
POTASSIUM: 4 mmol/L (ref 3.5–5.2)
SODIUM: 144 mmol/L (ref 134–144)
Total Protein: 6.6 g/dL (ref 6.0–8.5)

## 2018-07-14 LAB — C-REACTIVE PROTEIN: CRP: 1 mg/L (ref 0–10)

## 2018-07-14 LAB — 25-HYDROXY VITAMIN D LCMS D2+D3
25-Hydroxy, Vitamin D-3: 19 ng/mL
25-Hydroxy, Vitamin D: 26 ng/mL — ABNORMAL LOW

## 2018-07-14 LAB — COMPLIANCE DRUG ANALYSIS, UR

## 2018-07-14 LAB — 25-HYDROXYVITAMIN D LCMS D2+D3: 25-HYDROXY, VITAMIN D-2: 7.2 ng/mL

## 2018-07-14 LAB — SEDIMENTATION RATE: Sed Rate: 9 mm/hr (ref 0–30)

## 2018-07-14 LAB — VITAMIN B12: VITAMIN B 12: 396 pg/mL (ref 232–1245)

## 2018-08-05 ENCOUNTER — Encounter: Payer: Self-pay | Admitting: Pain Medicine

## 2018-08-05 ENCOUNTER — Ambulatory Visit: Payer: Medicare Other | Attending: Pain Medicine | Admitting: Pain Medicine

## 2018-08-05 ENCOUNTER — Other Ambulatory Visit: Payer: Self-pay

## 2018-08-05 VITALS — BP 99/63 | HR 64 | Temp 97.6°F | Ht 66.0 in | Wt 195.0 lb

## 2018-08-05 DIAGNOSIS — R51 Headache: Secondary | ICD-10-CM | POA: Diagnosis not present

## 2018-08-05 DIAGNOSIS — M5410 Radiculopathy, site unspecified: Secondary | ICD-10-CM | POA: Diagnosis not present

## 2018-08-05 DIAGNOSIS — I69328 Other speech and language deficits following cerebral infarction: Secondary | ICD-10-CM | POA: Insufficient documentation

## 2018-08-05 DIAGNOSIS — G894 Chronic pain syndrome: Secondary | ICD-10-CM | POA: Diagnosis not present

## 2018-08-05 DIAGNOSIS — G43019 Migraine without aura, intractable, without status migrainosus: Secondary | ICD-10-CM | POA: Insufficient documentation

## 2018-08-05 DIAGNOSIS — M1990 Unspecified osteoarthritis, unspecified site: Secondary | ICD-10-CM | POA: Insufficient documentation

## 2018-08-05 DIAGNOSIS — R531 Weakness: Secondary | ICD-10-CM | POA: Diagnosis not present

## 2018-08-05 DIAGNOSIS — M47812 Spondylosis without myelopathy or radiculopathy, cervical region: Secondary | ICD-10-CM | POA: Insufficient documentation

## 2018-08-05 DIAGNOSIS — M25562 Pain in left knee: Secondary | ICD-10-CM | POA: Insufficient documentation

## 2018-08-05 DIAGNOSIS — M109 Gout, unspecified: Secondary | ICD-10-CM | POA: Insufficient documentation

## 2018-08-05 DIAGNOSIS — F1021 Alcohol dependence, in remission: Secondary | ICD-10-CM | POA: Diagnosis not present

## 2018-08-05 DIAGNOSIS — Z79891 Long term (current) use of opiate analgesic: Secondary | ICD-10-CM | POA: Diagnosis not present

## 2018-08-05 DIAGNOSIS — I69351 Hemiplegia and hemiparesis following cerebral infarction affecting right dominant side: Secondary | ICD-10-CM | POA: Diagnosis not present

## 2018-08-05 DIAGNOSIS — M25561 Pain in right knee: Secondary | ICD-10-CM | POA: Diagnosis not present

## 2018-08-05 DIAGNOSIS — M542 Cervicalgia: Secondary | ICD-10-CM | POA: Diagnosis not present

## 2018-08-05 DIAGNOSIS — I10 Essential (primary) hypertension: Secondary | ICD-10-CM | POA: Insufficient documentation

## 2018-08-05 DIAGNOSIS — Z87891 Personal history of nicotine dependence: Secondary | ICD-10-CM | POA: Diagnosis not present

## 2018-08-05 DIAGNOSIS — M5030 Other cervical disc degeneration, unspecified cervical region: Secondary | ICD-10-CM | POA: Insufficient documentation

## 2018-08-05 DIAGNOSIS — Z79899 Other long term (current) drug therapy: Secondary | ICD-10-CM | POA: Diagnosis not present

## 2018-08-05 DIAGNOSIS — G890 Central pain syndrome: Secondary | ICD-10-CM | POA: Diagnosis not present

## 2018-08-05 DIAGNOSIS — G8929 Other chronic pain: Secondary | ICD-10-CM

## 2018-08-05 DIAGNOSIS — G43719 Chronic migraine without aura, intractable, without status migrainosus: Secondary | ICD-10-CM

## 2018-08-05 NOTE — Progress Notes (Signed)
Patient's Name: Carl [name]  MRN: [id number]  Referring Provider: Hortencia Pilar, MD  DOB: 04/02/1946  PCP: Hortencia Pilar, MD  DOS: 08/05/2018  Note by: Gaspar Cola, MD  Service setting: Ambulatory outpatient  Specialty: Interventional Pain Management  Location: [facility] (AMB) Pain Management Facility    Patient type: Established   Primary Reason(s) for Visit: Encounter for evaluation before starting new chronic pain management plan of care (Level of risk: moderate) CC: Migraine  HPI  Carl [name] is a 72 y.o. year old, male patient, who comes today for a follow-up evaluation to review the test results and decide on a treatment plan. He has GOUT; Alcohol withdrawal ([place]); ALCOHOL ABUSE; ANXIETY, SITUATIONAL; Essential hypertension; LUNG NODULE; UNSPECIFIED PROSTATITIS; CELLULITIS, HAND, LEFT; CONTACT DERMATITIS&OTHER ECZEMA DUE TO PLANTS; Osteoarthrosis and allied disorders; CHEST PAIN; Impaired fasting glucose; NECK SPRAIN AND STRAIN; Thalamic pain syndrome; Adjustment disorder with mixed disturbance of emotions and conduct; Alcohol dependence in remission ([place]); Anxiety and depression; Cataracts, bilateral; Cervical spondylosis without myelopathy; Cervicalgia of occipito-atlanto-axial region; Chronic pain disorder; Cognitive disorder; DDD (degenerative disc disease), cervical; Depression, major, single episode, moderate ([place]); Cervical facet arthropathy; Fuchs' corneal dystrophy; GI bleed; History of stroke; Intractable 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; LVH (left ventricular hypertrophy); Mitral insufficiency; Neuralgia, neuritis, and radiculitis, unspecified; Nocturia; Persistent insomnia; Pure hypercholesterolemia; Right sided weakness; Slurred speech; Tricuspid insufficiency; Word finding difficulty; Opiate use; Chronic headaches; Chronic pain of both knees; Occipital neuralgia (Left); GIB (gastrointestinal bleeding); Melena; Acute esophagogastric ulcer; Duodenum ulcer;  Abnormal behavior; History of adenomatous polyp of colon; History of upper gastrointestinal bleeding; Mild cognitive impairment; Chronic neck pain (Primary Area of Pain) (Left); Chronic pain syndrome; Pharmacologic therapy; Disorder of skeletal system; Problems influencing health status; Chronic facial pain (Secondary Area of Pain) (Left); and Central pain syndrome on their problem list. His primarily concern today is the Migraine  Pain Assessment: Location: Anterior(neck) Head Radiating: pt radiaties up and down my head Onset: More than a month ago Duration: Chronic pain Quality: Aching, Constant Severity: 7 /10 (subjective, self-reported pain score)  Note: Reported level is inconsistent with clinical observations. Clinically the patient looks like a 4/10 A 4/10 is viewed as "Moderately Severe" and described as impossible to ignore for more than a few minutes. With effort, patients may still be able to manage work or participate in some social activities. Very difficult to concentrate. Signs of autonomic nervous system discharge are evident: dilated pupils (mydriasis); mild sweating (diaphoresis); sleep interference. Heart rate becomes elevated (>115 bpm). Diastolic blood pressure (lower number) rises above 100 mmHg. Patients find relief in laying down and not moving. Information on the proper use of the pain scale provided to the patient today. When using our objective Pain Scale, levels between 6 and 10/10 are said to belong in an emergency room, as it progressively worsens from a 6/10, described as severely limiting, requiring emergency care not usually available at an outpatient pain management facility. At a 6/10 level, communication becomes difficult and requires great effort. Assistance to reach the emergency department may be required. Facial flushing and profuse sweating along with potentially dangerous increases in heart rate and blood pressure will be evident. Effect on ADL: limits my daily  activities Timing: Constant Modifying factors:  nothing helps BP: 99/63  HR: 64  Carl [name] comes in today for a follow-up visit after his initial evaluation on 07/09/2018. Today we went over the results of his tests. These were explained in "Layman's terms". During today's appointment we  went over my diagnostic impression, as well as the proposed treatment plan.  According to the patient's primary pain is in his neck (L). He admits that the left. No pain on the right at this point. Patient has a history of left thalamus hemorrhagic Stroke. Pain goes to back of head. He has radicular symptoms that radiate around his head into his left eye. 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 He has been experiencing these symptoms since 2000. Since 2010, H/As began to worsen. This worsened by 2013, when he had the stroke. He denies any precipitating factors. He did suffer a stroke in 2013. He has some right-sided weakness secondary to stroke. He denies any previous surgery on his neck. He has had multiple injections by Dr. Manuella Ghazi at [facility], which she states were not effective. He has had Botox, occipital nerve blocks x 2 in [facility] @ [place]. He states that he has seen Dr. Rock Nephew, chiropractor. He has been seen by Dr. Evalee Mutton neurosurgeon, Dr. Rosine Door, Dr. Theda Sers and a few other providers within [facility] neurology and neurosurgery.  In considering the treatment plan options, Carl [name] was reminded that I no longer take patients for medication management only. I asked him to let me know if he had no intention of taking advantage of the interventional therapies, so that we could make arrangements to provide this space to someone interested. I also made it clear that undergoing interventional therapies for the purpose of getting pain medications is very inappropriate on the part of a patient, and it will not be tolerated in this practice. This type of behavior would  suggest true addiction and therefore it requires referral to an addiction specialist.   Further details on both, my assessment(s), as well as the proposed treatment plan, please see below.  Controlled Substance Pharmacotherapy Assessment REMS (Risk Evaluation and Mitigation Strategy)  Analgesic: None Highest recorded MME/day: 19.57 mg/day MME/day: 0 mg/day Pill Count: None expected due to no prior prescriptions written by our practice. No notes on file Pharmacokinetics: Liberation and absorption (onset of action): WNL Distribution (time to peak effect): WNL Metabolism and excretion (duration of action): WNL         Pharmacodynamics: Desired effects: Analgesia: Mr. Catterton reports >50% benefit. Functional ability: Patient reports that medication allows him to accomplish basic ADLs Clinically meaningful improvement in function (CMIF): Sustained CMIF goals met Perceived effectiveness: Described as relatively effective, allowing for increase in activities of daily living (ADL) Undesirable effects: Side-effects or Adverse reactions: None reported Monitoring: [place] PMP: Online review of the past 43-monthperiod previously conducted. Not applicable at this point since we have not taken over the patient's medication management yet. List of other Serum/Urine Drug Screening Test(s):  Lab Results  Component Value Date   COCAINSCRNUR NONE DETECTED 10/08/2011   THCU NONE DETECTED 10/08/2011   List of all UDS test(s) done:  Lab Results  Component Value Date   SUMMARY FINAL 07/09/2018   SUMMARY FINAL 03/03/2017   Last UDS on record: Summary  Date Value Ref Range Status  07/09/2018 FINAL  Final    Comment:    ==================================================================== TOXASSURE COMP DRUG ANALYSIS,UR ==================================================================== Test                             Result       Flag       Units Drug Present  and Declared for Prescription  Verification   Gabapentin                     PRESENT      EXPECTED   Acetaminophen                  PRESENT      EXPECTED   Salicylate                     PRESENT      EXPECTED   Ibuprofen                      PRESENT      EXPECTED   Clonidine                      PRESENT      EXPECTED Drug Absent but Declared for Prescription Verification   Hydrocodone                    Not Detected UNEXPECTED ng/mg creat   Duloxetine                     Not Detected UNEXPECTED   Verapamil                      Not Detected UNEXPECTED ==================================================================== Test                      Result    Flag   Units      Ref Range   Creatinine              160              mg/dL      >=20 ==================================================================== Declared Medications:  The flagging and interpretation on this report are based on the  following declared medications.  Unexpected results may arise from  inaccuracies in the declared medications.  **Note: The testing scope of this panel includes these medications:  Clonidine  Duloxetine  Gabapentin  Hydrocodone (Hydrocodone-Acetaminophen)  Verapamil  **Note: The testing scope of this panel does not include small to  moderate amounts of these reported medications:  Acetaminophen  Acetaminophen (Excedrin)  Acetaminophen (Hydrocodone-Acetaminophen)  Aspirin (Excedrin)  Ibuprofen  **Note: The testing scope of this panel does not include following  reported medications:  Amlodipine  Amlodipine Besylate  Atorvastatin  Caffeine (Excedrin)  Carvedilol  Cyanocobalamin  Doxazosin  Hydralazine  Iron (Ferrous Sulfate)  Losartan  Pantoprazole  Polyethylene Glycol  Potassium  Vitamin D2 (Ergocalciferol) ==================================================================== For clinical consultation, please call [phone]. ====================================================================    UDS  interpretation: No unexpected findings.          Medication Assessment Form: Patient introduced to form today Treatment compliance: Treatment may start today if patient agrees with proposed plan. Evaluation of compliance is not applicable at this point Risk Assessment Profile: Aberrant behavior: See initial evaluations. None observed or detected today Comorbid factors increasing risk of overdose: See initial evaluation. No additional risks detected today Opioid risk tool (ORT) (Total Score): 1 Personal History of Substance Abuse (SUD-Substance use disorder):  Alcohol: Negative  Illegal Drugs: Negative  Rx Drugs: Negative  ORT Risk Level calculation: Low Risk Risk of substance use disorder (SUD): Low Opioid Risk Tool - 08/05/18 1033      Family History of Substance Abuse   Alcohol  Negative  Illegal Drugs  Negative    Rx Drugs  Negative      Personal History of Substance Abuse   Alcohol  Negative    Illegal Drugs  Negative    Rx Drugs  Negative      Age   Age between 67-45 years   No      History of Preadolescent Sexual Abuse   History of Preadolescent Sexual Abuse  Negative or Male      Psychological Disease   Psychological Disease  Negative    Depression  Positive      Total Score   Opioid Risk Tool Scoring  1    Opioid Risk Interpretation  Low Risk      ORT Scoring interpretation table:  Score <3 = Low Risk for SUD  Score between 4-7 = Moderate Risk for SUD  Score >8 = High Risk for Opioid Abuse   Risk Mitigation Strategies:  Patient opioid safety counseling: Not applicable. Patient-Prescriber Agreement (PPA): No agreement signed.  Controlled substance notification to other providers: None required. No opioid therapy.  Pharmacologic Plan: No opioid analgesic prescribed.             Laboratory Chemistry  Inflammation Markers (CRP: Acute Phase) (ESR: Chronic Phase) Lab Results  Component Value Date   CRP 1 07/09/2018   ESRSEDRATE 9 07/09/2018                          Rheumatology Markers Lab Results  Component Value Date   LABURIC 4.6 03/03/2017   LYMEABIGMQN <0.80 03/03/2017                        Renal Function Markers Lab Results  Component Value Date   BUN 23 07/09/2018   CREATININE 1.10 07/09/2018   BCR 21 07/09/2018   GFRAA 78 07/09/2018   GFRNONAA 67 07/09/2018                             Hepatic Function Markers Lab Results  Component Value Date   AST 17 07/09/2018   ALT 18 06/19/2017   ALBUMIN 4.1 07/09/2018   ALKPHOS 50 07/09/2018   AMMONIA 17 10/14/2011                        Electrolytes Lab Results  Component Value Date   NA 144 07/09/2018   K 4.0 07/09/2018   CL 107 (H) 07/09/2018   CALCIUM 9.4 07/09/2018   MG 1.9 07/09/2018   PHOS 2.6 10/10/2011                        Neuropathy Markers Lab Results  Component Value Date   VITAMINB12 396 07/09/2018   FOLATE 4.6 04/24/2009   HGBA1C 5.3 10/12/2011                        CNS Tests No results found.  Bone Pathology Markers Lab Results  Component Value Date   VD25OH 39.2 06/21/2017   25OHVITD1 26 (L) 07/09/2018   25OHVITD2 7.2 07/09/2018   25OHVITD3 19 07/09/2018                         Coagulation Parameters Lab Results  Component Value Date   INR 1.06 06/19/2017   LABPROT 13.7 06/19/2017  APTT 27 06/19/2017   PLT 167 06/21/2017                        Cardiovascular Markers Lab Results  Component Value Date   TROPONINI <0.30 10/08/2011   HGB 8.5 (L) 06/22/2017   HCT 21.9 (L) 06/21/2017   HCT 22.4 (L) 06/21/2017                         CA Markers No results found.  Note: Lab results reviewed.  Recent Diagnostic Imaging Review  Cervical Imaging: Cervical CT wo contrast:  Results for orders placed during the hospital encounter of 12/06/16  CT Cervical Spine Wo Contrast   Narrative CLINICAL DATA:  Headache for 4 years, gradually worsening. Left neck pain. Occipital pain.  EXAM: CT HEAD WITHOUT CONTRAST  CT CERVICAL  SPINE WITHOUT CONTRAST  TECHNIQUE: Multidetector CT imaging of the head and cervical spine was performed following the standard protocol without intravenous contrast. Multiplanar CT image reconstructions of the cervical spine were also generated.  COMPARISON:  10/15/2011 head CT  FINDINGS: CT HEAD FINDINGS  Brain: No evidence of acute infarction, hemorrhage, hydrocephalus, extra-axial collection or mass lesion/mass effect.  Chronic microvascular disease with mild for age ischemic gliosis in the cerebral white matter. Cavity in the left thalamus which was related to hemorrhage in 2013. There is more lateral and inferior lacune. Normal brain volume.  Vascular: Atherosclerotic calcification. No hyperdense vessel suspected.  Skull: No acute finding  Sinuses/Orbits: Negative globes.  CT CERVICAL SPINE FINDINGS  Alignment: Straightening with mild anterolisthesis at C3-4, facet mediated.  Skull base and vertebrae: No acute fracture. No primary bone lesion or focal pathologic process.  Soft tissues and spinal canal: No evidence of inflammation or mass. No evidence of epidural hematoma. 15 mm left thyroid nodule.  Disc levels: Degenerative disc narrowing that is advanced from C3-4 to C6-7, with spurring. Uncovertebral spurs narrow the bilateral foramina, up to moderate at C3-4. No evidence of cord impingement. Facet arthropathy with spurring greatest at C3-4.  Upper chest: No acute finding  IMPRESSION: 1. No acute intracranial or cervical spine finding. 2. 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 3. Advanced cervical disc degeneration from C3-4 to C6-7. Uncovertebral spurs cause up to moderate foraminal narrowing on the left at C3-4. 4. 15 mm left thyroid nodule, at the size threshold where outpatient follow-up sonography is recommended.   Electronically Signed   By: Monte Fantasia M.D.   On: 12/06/2016 14:57    Knee  Imaging: Knee-R DG 1-2 views:  Results for orders placed during the hospital encounter of 10/08/11  DG Knee 1-2 Views Right   Narrative *RADIOLOGY REPORT*  Clinical Data: Pain and swelling  RIGHT KNEE - 1-2 VIEW  Comparison: None.  Findings: There is a moderately large effusion in the suprapatellar bursa.  Negative for fracture, dislocation, or other acute bony abnormality.  No significant osseous degenerative change.  Normal mineralization and alignment.  IMPRESSION:  1.  Effusion without fracture or other acute bony abnormality.  Original Report Authenticated By: Trecia Rogers, M.D.   Wrist Imaging: Wrist-R DG Complete:  Results for orders placed during the hospital encounter of 10/08/11  DG Wrist Complete Right   Narrative *RADIOLOGY REPORT*  Clinical Data: Pain and swelling  RIGHT WRIST - COMPLETE 3+ VIEW  Comparison: None.  Findings: Carpal rows intact. Negative for fracture, dislocation, or other acute abnormality.  Normal  alignment and mineralization. No significant degenerative change.  There is dorsal soft tissue swelling.  IMPRESSION:  Dorsal soft tissue swelling without fracture or other bony abnormality  Original Report Authenticated By: Trecia Rogers, M.D.   Complexity Note: Imaging results reviewed. Results shared with Mr. Katzenstein, using Layman's terms.                         Meds   Current Outpatient Medications:  .  acetaminophen (TYLENOL) 500 MG tablet, 500 mg every 6 (six) hours as needed for Pain., Disp: , Rfl:  .  amLODipine (NORVASC) 10 MG tablet, Take by mouth., Disp: , Rfl:  .  atorvastatin (LIPITOR) 10 MG tablet, , Disp: , Rfl: 2 .  carvedilol (COREG) 12.5 MG tablet, Take 12.5 mg by mouth 2 (two) times daily. , Disp: , Rfl: 0 .  cloNIDine (CATAPRES) 0.1 MG tablet, Take by mouth., Disp: , Rfl:  .  doxazosin (CARDURA) 8 MG tablet, Take 8 mg by mouth at bedtime.  , Disp: , Rfl:  .  ferrous sulfate 325 (65 FE) MG tablet,  Take 1 tablet (325 mg total) by mouth 2 (two) times daily with a meal., Disp: 60 tablet, Rfl: 1 .  gabapentin (NEURONTIN) 100 MG capsule, Take by mouth., Disp: , Rfl:  .  pantoprazole (PROTONIX) 40 MG tablet, Take 1 tablet (40 mg total) by mouth 2 (two) times daily before a meal., Disp: 60 tablet, Rfl: 2 .  polyethylene glycol-electrolytes (NULYTELY/GOLYTELY) 420 g solution, TAKE 4 000 MLS BY MOUTH ONCE. TAKE AS WRITTEN FOR COLONIC PREP., Disp: , Rfl: 0 .  potassium chloride SA (K-DUR,KLOR-CON) 20 MEQ tablet, Take by mouth., Disp: , Rfl:  .  Vitamin D, Ergocalciferol, (DRISDOL) 1.25 MG (50000 UT) CAPS capsule, Take 1 capsule (50,000 Units total) by mouth weekly for 12 weeks., Disp: , Rfl:  .  potassium chloride SA (K-DUR,KLOR-CON) 20 MEQ tablet, Take 1 tablet by mouth daily., Disp: , Rfl:   ROS  Constitutional: Denies any fever or chills Gastrointestinal: No reported hemesis, hematochezia, vomiting, or acute GI distress Musculoskeletal: Denies any acute onset joint swelling, redness, loss of ROM, or weakness Neurological: No reported episodes of acute onset apraxia, aphasia, dysarthria, agnosia, amnesia, paralysis, loss of coordination, or loss of consciousness  Allergies  Mr. Yoak is allergic to ace inhibitors; hydrochlorothiazide; lisinopril; nsaids; and ramipril.  PFSH  Drug: Mr. Griep  reports that he does not use drugs. Alcohol:  reports that he does not drink alcohol. Tobacco:  reports that he quit smoking about 39 years ago. He has never used smokeless tobacco. Medical:  has a past medical history of Adenoma of colon, Alcoholism ([place]), Anxiety and depression, Cataract, Cervical spondylosis without myelopathy, Cervicalgia of occipito-atlanto-axial region, Chronic pain disorder, Degeneration of cervical intervertebral disc, Degeneration, intervertebral disc, cervical, Duodenal ulcer (2011), Facet arthropathy, cervical, Fuchs' corneal dystrophy, Gastric ulcer (2011), GI bleed, Gout,  Hypertension, Impaired fasting glucose, Intractable 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, LVH (left ventricular hypertrophy), Mild cognitive impairment, Mitral insufficiency, Neck pain, Neuralgia and neuritis, Nocturia, Occipital neuralgia, Osteoarthritis, Osteoarthritis, Pure hypercholesterolemia, Radiculitis, Stroke ([place]), and Tricuspid insufficiency. Surgical: Mr. Richman  has a past surgical history that includes Cystogram (03/08); Esophagogastroduodenoscopy (egd) with propofol (N/A, 06/20/2017); and Colonoscopy with propofol (N/A, 06/05/2018). Family: family history includes Fibromyalgia in his mother.  Constitutional Exam  General appearance: Well nourished, well developed, and well hydrated. In no apparent acute distress Vitals:   08/05/18 1022  BP: 99/63  Pulse: 64  Temp: 97.6 F (36.4 C)  SpO2: 97%  Weight: 195 lb (88.5 kg)  Height: 5' 6" (1.676 m)   BMI Assessment: Estimated body mass index is 31.47 kg/m as calculated from the following:   Height as of this encounter: 5' 6" (1.676 m).   Weight as of this encounter: 195 lb (88.5 kg).  BMI interpretation table: BMI level Category Range association with higher incidence of chronic pain  <18 kg/m2 Underweight   18.5-24.9 kg/m2 Ideal body weight   25-29.9 kg/m2 Overweight Increased incidence by 20%  30-34.9 kg/m2 Obese (Class I) Increased incidence by 68%  35-39.9 kg/m2 Severe obesity (Class II) Increased incidence by 136%  >40 kg/m2 Extreme obesity (Class III) Increased incidence by 254%   Patient's current BMI Ideal Body weight  Body mass index is 31.47 kg/m. Ideal body weight: 63.8 kg (140 lb 10.5 oz) Adjusted ideal body weight: 73.7 kg (162 lb 6.3 oz)   BMI Readings from Last 4 Encounters:  08/05/18 31.47 kg/m  07/09/18 32.95 kg/m  06/05/18 34.33 kg/m  07/10/17 28.21 kg/m   Wt Readings from Last 4 Encounters:  08/05/18 195 lb (88.5 kg)  07/09/18 198 lb (89.8 kg)  06/05/18 200 lb (90.7 kg)   07/10/17 185 lb 8 oz (84.1 kg)  Psych/Mental status: Alert, oriented x 3 (person, place, & time)       Eyes: PERLA Respiratory: No evidence of acute respiratory distress  Cervical Spine Area Exam  Skin & Axial Inspection: No masses, redness, edema, swelling, or associated skin lesions Alignment: Symmetrical Functional ROM: Unrestricted ROM      Stability: No instability detected Muscle Tone/Strength: Functionally intact. No obvious neuro-muscular anomalies detected. Sensory (Neurological): Unimpaired Palpation: No palpable anomalies              Upper Extremity (UE) Exam    Side: Right upper extremity  Side: Left upper extremity  Skin & Extremity Inspection: Skin color, temperature, and hair growth are WNL. No peripheral edema or cyanosis. No masses, redness, swelling, asymmetry, or associated skin lesions. No contractures.  Skin & Extremity Inspection: Skin color, temperature, and hair growth are WNL. No peripheral edema or cyanosis. No masses, redness, swelling, asymmetry, or associated skin lesions. No contractures.  Functional ROM: Unrestricted ROM          Functional ROM: Unrestricted ROM          Muscle Tone/Strength: Functionally intact. No obvious neuro-muscular anomalies detected.  Muscle Tone/Strength: Functionally intact. No obvious neuro-muscular anomalies detected.  Sensory (Neurological): Unimpaired          Sensory (Neurological): Unimpaired          Palpation: No palpable anomalies              Palpation: No palpable anomalies              Provocative Test(s):  Phalen's test: deferred Tinel's test: deferred Apley's scratch test (touch opposite shoulder):  Action 1 (Across chest): deferred Action 2 (Overhead): deferred Action 3 (LB reach): deferred   Provocative Test(s):  Phalen's test: deferred Tinel's test: deferred Apley's scratch test (touch opposite shoulder):  Action 1 (Across chest): deferred Action 2 (Overhead): deferred Action 3 (LB reach): deferred     Thoracic Spine Area Exam  Skin & Axial Inspection: No masses, redness, or swelling Alignment: Symmetrical Functional ROM: Unrestricted ROM Stability: No instability detected Muscle Tone/Strength: Functionally intact. No obvious neuro-muscular anomalies detected. Sensory (Neurological): Unimpaired Muscle strength & Tone: No palpable  anomalies  Lumbar Spine Area Exam  Skin & Axial Inspection: No masses, redness, or swelling Alignment: Symmetrical Functional ROM: Unrestricted ROM       Stability: No instability detected Muscle Tone/Strength: Functionally intact. No obvious neuro-muscular anomalies detected. Sensory (Neurological): Unimpaired Palpation: No palpable anomalies       Provocative Tests: Hyperextension/rotation test: deferred today       Lumbar quadrant test (Kemp's test): deferred today       Lateral bending test: deferred today       Patrick's Maneuver: deferred today                   FABER test: deferred today                   S-I anterior distraction/compression test: deferred today         S-I lateral compression test: deferred today         S-I Thigh-thrust test: deferred today         S-I Gaenslen's test: deferred today          Gait & Posture Assessment  Ambulation: Unassisted Gait: Relatively normal for age and body habitus Posture: WNL   Lower Extremity Exam    Side: Right lower extremity  Side: Left lower extremity  Stability: No instability observed          Stability: No instability observed          Skin & Extremity Inspection: Skin color, temperature, and hair growth are WNL. No peripheral edema or cyanosis. No masses, redness, swelling, asymmetry, or associated skin lesions. No contractures.  Skin & Extremity Inspection: Skin color, temperature, and hair growth are WNL. No peripheral edema or cyanosis. No masses, redness, swelling, asymmetry, or associated skin lesions. No contractures.  Functional ROM: Unrestricted ROM                  Functional  ROM: Unrestricted ROM                  Muscle Tone/Strength: Functionally intact. No obvious neuro-muscular anomalies detected.  Muscle Tone/Strength: Functionally intact. No obvious neuro-muscular anomalies detected.  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  Visit Diagnosis: 1. Thalamic pain syndrome   2. Central pain syndrome   3. Chronic pain syndrome   4. Chronic neck pain (Primary Area of Pain) (Left)   5. 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 Intractable 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   7. Right sided weakness    Problems updated and reviewed during this visit: Problem  Chronic neck pain (Primary Area of Pain) (Left)  Chronic facial pain (Secondary Area of Pain) (Left)  Occipital neuralgia (Left)  Cervical Spondylosis Without Myelopathy  Cervicalgia of [place]-Axial Region  Chronic Pain Disorder  Cervical facet arthropathy  Ddd (Degenerative Disc Disease),  Cervical  GOUT   Qualifier: Diagnosis of  By: Lurlean Nanny LPN, [place] of Care  Pharmacotherapy (Medications Ordered): No orders of the defined types were placed in this encounter.  Procedure Orders    No procedure(s) ordered today   Lab Orders  No laboratory test(s)  ordered today   Imaging Orders  No imaging studies ordered today    Referral Orders     Ambulatory referral to Neurosurgery  Pharmacological management options:  Opioid Analgesics: I will not be prescribing any opioids at this time Membrane stabilizer: Will not be prescribed. Muscle relaxant: Will not be prescribed. NSAID: Will not be prescribed. Other analgesic(s): Will not be prescribed.   Interventional management options: Planned, scheduled, and/or pending:    Referral to [facility] Neurosurgery for consideration on Deep Brain Stimulation implant.   Considering:   None.  The patient indicates that he has had prior interventional procedures, none of which have provided him with any relief of the pain indicating that it is very likely that the patient's pain is central.  As such, it would not respond to any interventional therapies.  At this point, I believe that the patient's best alternative is to seek a neurosurgical consult for possible deep brain stimulator to assist with what we believe is thalamic pain.   PRN Procedures:   None at this time   Provider-requested follow-up: Return for Referral to [facility] Neurosurgery for consideration on Deep Brain Stimulation implant..  No future appointments.  Primary Care Physician: Hortencia Pilar, MD Location: [facility] Note by: Gaspar Cola, MD Date: 08/05/2018; Time: 11:34 AM

## 2018-09-03 ENCOUNTER — Telehealth: Payer: Self-pay | Admitting: Pain Medicine

## 2018-09-03 NOTE — Telephone Encounter (Signed)
Pt called and stated that at his last appt with Dr. Delane Ginger that he recommended having an implant put in the pts neck to help with pain. Pt had questions about the process.

## 2018-09-03 NOTE — Telephone Encounter (Signed)
Referral was put in for Dr. Cari Caraway but their office called and stated that they did not do Deep brain stimualtors. Dr. Dossie Arbour was left a handwritten note by me on 09/03/2018 about this so he could address it when he returned (find a neurosurgeon who would do it) and put in a new referral. Message routed to Dr. Dossie Arbour also.            Carl [name]  Patient called with this info.

## 2018-09-08 ENCOUNTER — Telehealth: Payer: Self-pay | Admitting: Pain Medicine

## 2018-09-08 NOTE — Telephone Encounter (Signed)
This patient called back re; SCS.  When going through Dr Adalberto Cole plan it shows that patient was to be referred to [facility] Neurosurgery.  Under referrals it appears that the referral was denied.  I told patient that I would check with secretarial staff to see if this is in fact what has happened and let them follow-up with patient or Dr Dossie Arbour as to how to proceed.

## 2018-09-08 NOTE — Telephone Encounter (Signed)
Pt called and stated that he has questions about SCS and would like a nurse to call him back

## 2018-09-09 NOTE — Telephone Encounter (Signed)
Dr. Izora Ribas said that they do not do this there, and he doesn't know anyone that does. Not sure how to proceed with this.

## 2018-09-14 NOTE — Telephone Encounter (Signed)
I spoke with Manuela Schwartz at [place] and she suggested Dr. Dierdre Harness at [facility] in [place]. I will pass this on to Tendra to refer him.

## 2018-09-15 DIAGNOSIS — K269 Duodenal ulcer, unspecified as acute or chronic, without hemorrhage or perforation: Secondary | ICD-10-CM

## 2018-09-15 DIAGNOSIS — K253 Acute gastric ulcer without hemorrhage or perforation: Secondary | ICD-10-CM

## 2018-09-15 DIAGNOSIS — K921 Melena: Secondary | ICD-10-CM

## 2018-10-13 DIAGNOSIS — M4722 Other spondylosis with radiculopathy, cervical region: Secondary | ICD-10-CM | POA: Insufficient documentation

## 2018-11-12 ENCOUNTER — Ambulatory Visit: Payer: Medicare Other | Admitting: Nurse Practitioner

## 2018-11-17 ENCOUNTER — Ambulatory Visit: Payer: Medicare Other | Admitting: Nurse Practitioner

## 2018-11-26 ENCOUNTER — Ambulatory Visit: Payer: Medicare Other | Attending: Nurse Practitioner | Admitting: Nurse Practitioner

## 2018-11-26 ENCOUNTER — Other Ambulatory Visit: Payer: Self-pay

## 2018-11-26 DIAGNOSIS — M47812 Spondylosis without myelopathy or radiculopathy, cervical region: Secondary | ICD-10-CM | POA: Diagnosis not present

## 2018-11-26 DIAGNOSIS — G890 Central pain syndrome: Secondary | ICD-10-CM | POA: Diagnosis not present

## 2018-11-26 DIAGNOSIS — R51 Headache: Secondary | ICD-10-CM | POA: Diagnosis not present

## 2018-11-26 DIAGNOSIS — M5030 Other cervical disc degeneration, unspecified cervical region: Secondary | ICD-10-CM | POA: Diagnosis not present

## 2018-11-26 DIAGNOSIS — G894 Chronic pain syndrome: Secondary | ICD-10-CM

## 2018-11-26 DIAGNOSIS — R519 Headache, unspecified: Secondary | ICD-10-CM

## 2018-11-26 DIAGNOSIS — G8929 Other chronic pain: Secondary | ICD-10-CM

## 2018-11-26 NOTE — Progress Notes (Signed)
Virtual Visit via Telephone Note  I connected with Carl [name] on 11/26/18 at 10:30 AM EDT by telephone and verified that I am speaking with the correct person using two identifiers.   I discussed the limitations, risks, security and privacy concerns of performing an evaluation and management service by telephone and the availability of in person appointments. I also discussed with the patient that there may be a patient responsible charge related to this service. The patient expressed understanding and agreed to proceed.   History of Present Illness:  HPI  Reason for Visit: Carl [name] is a 73 y.o. year old, male patient, whose chief complaint of left sided head pain. Last Appointment: His last appointment at our practice was on 09/08/2018. I last saw him on 07/09/2018.  Pain Assessment: Today, Carl [name] describes the severity of the neck and head pain a 10/10.He admits that he continues to have  that the pain radiates up into his head and into the side of his face into his left eye. He is also having pain that is going down into his neck. He admits that the pain is constant. He has problems sleeping because of the pain. He has had multiple nerve blocks along with Botox injections in the past. He states that the Botox was somewhat effective. He admits that the pain has gotten worse in the last 5 years. He went to [facility] to the neurosurgeon as reccommended by Carl [name] for a Deep Brain Stimulator . He was told that he was not a candidate for an surgery and was referred back to our office. He continues to use large amounts of APAP and [employer] daily. He admits that this gives him some relief but he would like to see if there is anything additional that can be done to help control his pain.   Observations/Objective:   Mental status: Alert, oriented x 3 (person, place, & time)       Vitals: There were no vitals taken for this visit. BMI: Estimated body mass index is 31.47 kg/m as calculated from the  following:   Height as of 08/05/18: 5\' 6"  (1.676 m).   Weight as of 08/05/18: 195 lb (88.5 kg). Ideal: Patient weight not recorded    Assessment and Plan: Assessment   Status Diagnosis  Worsening Worsening Worsening 1. Chronic nonintractable headache, unspecified headache type   2. Cervical spondylosis without myelopathy   3. Chronic facial pain (Secondary Area of Pain) (Left)   4. DDD (degenerative disc disease), cervical   5. Central pain syndrome   6. Chronic pain syndrome      Updated Problems: No problems updated.    Follow Up Instructions:Interventional management options: Planned, scheduled, and/or pending:    Not at this time.    Considering:   None.  The patient indicates that he has had prior interventional procedures, none of which have provided him with any relief of the pain indicating that it is very likely that the patient's pain is central.  As such, it would not respond to any interventional therapies.  At this point, I believe that the patient's best alternative is to seek a neurosurgical consult for possible deep brain stimulator to assist with what we believe is thalamic pain.   PRN Procedures:   None at this time       I discussed the assessment and treatment plan with the patient. The patient was provided an opportunity to ask questions and all were answered. The patient agreed with  the plan and demonstrated an understanding of the instructions.   The patient was advised to call back or seek an in-person evaluation if the symptoms worsen or if the condition fails to improve as anticipated.  I provided 15 minutes of non-face-to-face time during this encounter.   Dionisio David, NP

## 2018-12-02 ENCOUNTER — Telehealth: Payer: Self-pay | Admitting: Nurse Practitioner

## 2018-12-02 NOTE — Telephone Encounter (Signed)
Patient called asking if Carl [name] has decided any course of treatment for him. Please let patient know

## 2018-12-02 NOTE — Telephone Encounter (Signed)
Can you please ask Dr Lowella Dandy if he has any recommendations for Carl [name].  He referred him to [facility] neurosurgery for a Deep Brain Stimulator for his headaches and neck pain. However he is not a candidate for surgery. Does he have any treatment that he feels like he can do to help his pain. He admits that Botox was some what effective.  Thanks

## 2018-12-03 NOTE — Telephone Encounter (Signed)
Crystal,            This message was printed and put on Dr. Adalberto Cole desk.

## 2018-12-16 ENCOUNTER — Telehealth: Payer: Self-pay | Admitting: Pain Medicine

## 2018-12-17 ENCOUNTER — Telehealth: Payer: Self-pay | Admitting: *Deleted

## 2018-12-17 NOTE — Telephone Encounter (Signed)
Patient called again wanting to know what is going to be done about his left sided neck and head pain. I set him up Virtual Visit Call w/ Dr. Dossie Arbour for Monday 12-21-18

## 2018-12-17 NOTE — Telephone Encounter (Signed)
Patient is asking what is next step to get some help with pain? He has seen neurologist and was told there is nothing they can do for him. He is having significant pain still. I let him know dr. Dossie Arbour will bring him in for physical eval as soon as we are bringing patients into the office again. He wants to know what can be done until then.

## 2018-12-17 NOTE — Telephone Encounter (Signed)
Spoke with patient re; his phone messages.  Told patient that I think his best option will be to speak with Dr Dossie Arbour via telephone on Monday which is his next appt time.  I did offer that if pain is so great, he could go to the ED.  Patient states he has added acetaminophen and ibuprofen to his regime and that this is "keeping him living".  Patient will speak to Dr Dossie Arbour on Monday.

## 2018-12-21 ENCOUNTER — Other Ambulatory Visit: Payer: Self-pay

## 2018-12-21 ENCOUNTER — Ambulatory Visit: Payer: Medicare Other | Attending: Pain Medicine | Admitting: Pain Medicine

## 2018-12-21 DIAGNOSIS — G8929 Other chronic pain: Secondary | ICD-10-CM

## 2018-12-21 DIAGNOSIS — M47812 Spondylosis without myelopathy or radiculopathy, cervical region: Secondary | ICD-10-CM

## 2018-12-21 DIAGNOSIS — R519 Headache, unspecified: Secondary | ICD-10-CM

## 2018-12-21 DIAGNOSIS — G894 Chronic pain syndrome: Secondary | ICD-10-CM

## 2018-12-21 DIAGNOSIS — M5030 Other cervical disc degeneration, unspecified cervical region: Secondary | ICD-10-CM

## 2018-12-21 DIAGNOSIS — R51 Headache: Secondary | ICD-10-CM | POA: Diagnosis not present

## 2018-12-21 DIAGNOSIS — M792 Neuralgia and neuritis, unspecified: Secondary | ICD-10-CM

## 2018-12-21 DIAGNOSIS — M542 Cervicalgia: Secondary | ICD-10-CM

## 2018-12-21 MED ORDER — GABAPENTIN 100 MG PO CAPS: 100.0000 mg | ORAL_CAPSULE | Freq: Every day | ORAL | 0 refills | Status: DC

## 2018-12-21 NOTE — Progress Notes (Signed)
Pain Management Virtual Encounter Note - Virtual Visit via Telephone Telehealth (real-time audio visits between healthcare provider and patient).  Patient's Phone No. & Preferred Pharmacy:  [phone] (home); [phone] (mobile); (Preferred) [phone] No e-mail address on record  [place], [place] - [address] [place] [address] Phone: [phone] Fax: [phone]   Pre-screening note:  Our staff contacted Carl [name] and offered him an "in person", "face-to-face" appointment versus a telephone encounter. He indicated preferring the telephone encounter, at this time.  Reason for Virtual Visit: COVID-19*  Social distancing based on CDC and AMA recommendations.   I contacted Carl [name] on 12/21/2018 at 11:27 AM via telephone and clearly identified myself as Gaspar Cola, MD. I verified that I was speaking with the correct person using two identifiers (Name and date of birth: 1946/05/06).  Advanced Informed Consent I sought verbal advanced consent from Carl [name] for virtual visit interactions. I informed Carl [name] of possible security and privacy concerns, risks, and limitations associated with providing "not-in-person" medical evaluation and management services. I also informed Carl [name] of the availability of "in-person" appointments. Finally, I informed him that there would be a charge for the virtual visit and that he could be  personally, fully or partially, financially responsible for it. Carl [name] expressed understanding and agreed to proceed.   Historic Elements   Carl [name] is a 73 y.o. year old, male patient evaluated today after his last encounter by our practice on 12/17/2018. 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 He also  has a past surgical history that includes Cystogram (03/08); Esophagogastroduodenoscopy (egd) with propofol (N/A, 06/20/2017); and Colonoscopy with propofol (N/A, 06/05/2018). Carl [name] has a current medication list which includes the following prescription(s): acetaminophen, amlodipine, atorvastatin, carvedilol, clonidine, doxazosin, ferrous sulfate, gabapentin, pantoprazole, polyethylene glycol-electrolytes, potassium chloride sa, potassium chloride sa, and vitamin d (ergocalciferol). He  reports that he quit smoking about 40 years ago. He has never used smokeless tobacco. He reports that he does not drink alcohol or use drugs. Carl [name] is allergic to ace inhibitors; hydrochlorothiazide; lisinopril; nsaids; and ramipril.   HPI  I last saw him on 12/16/2018. He is being evaluated for worsening of previously established problem.  Today I had a very long conversation with this patient.  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  I had explained to the patient that there is not much that we can offer for that type of problem.  He has been going to multiple physicians but today I have identified the primary problem as being the patient.  The first thing is that he has unrealistic expectations where he is  trying to find a cure to what he has.  The second problem is that although he has had multiple interventional therapies, he does not seem  to understand how they fit in his care and if they do not provide him with permanent relief of the pain, he simply does not go back to the treating physician to talk about those results and to modify the therapy.  The same is true with the medications.  He was provided with a prescription for Neurontin 100 mg to be increased slowly over time to 300 mg at bedtime, but once he got to that dose, and he did not experience complete relief of the pain, he simply assumed that he had not worked and stopped it.  He never gave the medicine or the prescribing physician a chance to continue adjusting the medicine and the dose to get the desired effect.  I confronted the patient today with this and he has admitted that that was the case.  I explained to the patient what I have to offer and the fact that there were absolutely no guarantees that any of it were at work.  However, I informed him that in order to be my patient he needed to not only have the treatments, but then come back for follow-up evaluations so that we could interpret the results as opposed to him trying to interpret results without having any type of medical knowledge.  He understood and accepted.  Reviewing the imaging shown below, there is the possibility that he may be having problems coming from either the inner portion of the cervical spine or the exterior structures.  I have offered to start with a diagnostic cervical epidural steroid injection and then to move onto the facet joints, if the first does not help.  I have again given him a prescription for they gabapentin and I have taken the time to explain to him how he should be taking it.  Once the COVID-19 restrictions are lifted, we will plan to do the left-sided cervical epidural steroid injection under fluoroscopic guidance and IV sedation.  However, for the time  being we will concentrate on adjusting the gabapentin.  Review of recent tests  CT Cervical Spine Wo Contrast CLINICAL DATA:  Headache for 4 years, gradually worsening. Left neck pain. Occipital pain.  EXAM: CT HEAD WITHOUT CONTRAST  CT CERVICAL SPINE WITHOUT CONTRAST  TECHNIQUE: Multidetector CT imaging of the head and cervical spine was performed following the standard protocol without intravenous contrast. Multiplanar CT image reconstructions of the cervical spine were also generated.  COMPARISON:  10/15/2011 head CT  FINDINGS: CT HEAD FINDINGS  Brain: No evidence of acute infarction, hemorrhage, hydrocephalus, extra-axial collection or mass lesion/mass effect.  Chronic microvascular disease with mild for age ischemic gliosis in the cerebral white matter. Cavity in the left thalamus which was related to hemorrhage in 2013. There is more lateral and inferior lacune. Normal brain volume.  Vascular: Atherosclerotic calcification. No hyperdense vessel suspected.  Skull: No acute finding  Sinuses/Orbits: Negative globes.  CT CERVICAL SPINE FINDINGS  Alignment: Straightening with mild anterolisthesis at C3-4, facet mediated.  Skull base and vertebrae: No acute fracture. No primary bone lesion or focal pathologic process.  Soft tissues and spinal canal: No evidence of inflammation or mass. No evidence of epidural hematoma. 15 mm left thyroid nodule.  Disc levels: Degenerative disc narrowing that is advanced from C3-4 to C6-7, with spurring. Uncovertebral spurs narrow the bilateral foramina, up to moderate at C3-4. No evidence of cord impingement. Facet arthropathy with spurring greatest at C3-4.  Upper chest: No acute finding  IMPRESSION: 1. No acute intracranial or cervical spine finding. 2.  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 3. Advanced cervical disc degeneration from C3-4 to C6-7. Uncovertebral spurs cause up to  moderate foraminal narrowing on the left at C3-4. 4. 15 mm left thyroid nodule, at the size threshold where outpatient follow-up sonography is recommended.  Electronically Signed   By: Monte Fantasia M.D.   On: 12/06/2016 14:57   CT Head Wo Contrast CLINICAL DATA:  Headache for 4 years, gradually worsening. Left neck pain. Occipital pain.  EXAM: CT HEAD WITHOUT CONTRAST  CT CERVICAL SPINE WITHOUT CONTRAST  TECHNIQUE: Multidetector CT imaging of the head and cervical spine was performed following the standard protocol without intravenous contrast. Multiplanar CT image reconstructions of the cervical spine were also generated.  COMPARISON:  10/15/2011 head CT  FINDINGS: CT HEAD FINDINGS  Brain: No evidence of acute infarction, hemorrhage, hydrocephalus, extra-axial collection or mass lesion/mass effect.  Chronic microvascular disease with mild for age ischemic gliosis in the cerebral white matter. Cavity in the left thalamus which was related to hemorrhage in 2013. There is more lateral and inferior lacune. Normal brain volume.  Vascular: Atherosclerotic calcification. No hyperdense vessel suspected.  Skull: No acute finding  Sinuses/Orbits: Negative globes.  CT CERVICAL SPINE FINDINGS  Alignment: Straightening with mild anterolisthesis at C3-4, facet mediated.  Skull base and vertebrae: No acute fracture. No primary bone lesion or focal pathologic process.  Soft tissues and spinal canal: No evidence of inflammation or mass. No evidence of epidural hematoma. 15 mm left thyroid nodule.  Disc levels: Degenerative disc narrowing that is advanced from C3-4 to C6-7, with spurring. Uncovertebral spurs narrow the bilateral foramina, up to moderate at C3-4. No evidence of cord impingement. Facet arthropathy with spurring greatest at C3-4.  Upper chest: No acute finding  IMPRESSION: 1. No acute intracranial or cervical spine finding. 2. Chronic small vessel  disease and remote left thalamus hemorrhagic stroke. 3. Advanced cervical disc degeneration from C3-4 to C6-7. Uncovertebral spurs cause up to moderate foraminal narrowing on the left at C3-4. 4. 15 mm left thyroid nodule, at the size threshold where outpatient follow-up sonography is recommended.  Electronically Signed   By: Monte Fantasia M.D.   On: 12/06/2016 14:57   Office Visit on 07/09/2018  Component Date Value Ref Range Status  . Summary 07/09/2018 FINAL   Final   Comment: ==================================================================== TOXAS[facility]RE COMP DRUG ANALYSIS,UR ==================================================================== Test                             Result       Flag       Units Drug Present and Declared for Prescription Verification   Gabapentin                     PRESENT      EXPECTED   Acetaminophen                  PRESENT      EXPECTED   Salicylate                     PRESENT      EXPECTED   Ibuprofen                      PRESENT      EXPECTED   Clonidine  PRESENT      EXPECTED Drug Absent but Declared for Prescription Verification   Hydrocodone                    Not Detected UNEXPECTED ng/mg creat   Duloxetine                     Not Detected UNEXPECTED   Verapamil                      Not Detected UNEXPECTED ==================================================================== Test                      Result    Flag   Units      Ref Range   Creatinine              160              mg/dL                                >=20 ==================================================================== Declared Medications:  The flagging and interpretation on this report are based on the  following declared medications.  Unexpected results may arise from  inaccuracies in the declared medications.  **Note: The testing scope of this panel includes these medications:  Clonidine  Duloxetine  Gabapentin  Hydrocodone  (Hydrocodone-Acetaminophen)  Verapamil  **Note: The testing scope of this panel does not include small to  moderate amounts of these reported medications:  Acetaminophen  Acetaminophen (Excedrin)  Acetaminophen (Hydrocodone-Acetaminophen)  Aspirin (Excedrin)  Ibuprofen  **Note: The testing scope of this panel does not include following  reported medications:  Amlodipine  Amlodipine Besylate  Atorvastatin  Caffeine (Excedrin)  Carvedilol  Cyanocobalamin  Doxazosin  Hydralazine  Iron (Ferrous Sulfate)  Losartan  Pantoprazole  Polyethylene Glycol  Potassium  Vitamin D2                           (Ergocalciferol) ==================================================================== For clinical consultation, please call [phone]. ====================================================================   . Glucose 07/09/2018 102* 65 - 99 mg/dL Final  . BUN 07/09/2018 23  8 - 27 mg/dL Final  . Creatinine, Ser 07/09/2018 1.10  0.76 - 1.27 mg/dL Final  . GFR calc non Af Amer 07/09/2018 67  >59 mL/min/1.73 Final  . GFR calc Af Amer 07/09/2018 78  >59 mL/min/1.73 Final  . BUN/Creatinine Ratio 07/09/2018 21  10 - 24 Final  . Sodium 07/09/2018 144  134 - 144 mmol/L Final  . Potassium 07/09/2018 4.0  3.5 - 5.2 mmol/L Final  . Chloride 07/09/2018 107* 96 - 106 mmol/L Final  . Calcium 07/09/2018 9.4  8.6 - 10.2 mg/dL Final  . Total Protein 07/09/2018 6.6  6.0 - 8.5 g/dL Final  . Albumin 07/09/2018 4.1  3.5 - 4.8 g/dL Final  . Globulin, Total 07/09/2018 2.5  1.5 - 4.5 g/dL Final  . Albumin/Globulin Ratio 07/09/2018 1.6  1.2 - 2.2 Final  . Bilirubin Total 07/09/2018 0.3  0.0 - 1.2 mg/dL Final  . Alkaline Phosphatase 07/09/2018 50  39 - 117 IU/L Final  . AST 07/09/2018 17  0 - 40 IU/L Final  . Magnesium 07/09/2018 1.9  1.6 - 2.3 mg/dL Final  . Vitamin B-12 07/09/2018 396  232 - 1,245 pg/mL Final  . Sed Rate 07/09/2018 9  0 - 30 mm/hr Final  . 25-Hydroxy, Vitamin  D 07/09/2018 26* ng/mL  Final   Comment: Reference Range: All Ages: Target levels 30 - 100   . 25-Hydroxy, Vitamin D-2 07/09/2018 7.2  ng/mL Final  . 25-Hydroxy, Vitamin D-3 07/09/2018 19  ng/mL Final  . CRP 07/09/2018 1  0 - 10 mg/L Final   Assessment  The primary encounter diagnosis was Chronic pain disorder. Diagnoses of Cervicalgia of occipito-atlanto-axial region, Cervical facet arthropathy, Chronic neck pain (Primary Area of Pain) (Left), Chronic facial pain (Secondary Area of Pain) (Left), DDD (degenerative disc disease), cervical, and Neurogenic pain were also pertinent to this visit.  Plan of Care  I have changed Carl [name]'s gabapentin. I am also having him maintain his doxazosin, carvedilol, potassium chloride SA, ferrous sulfate, pantoprazole, atorvastatin, polyethylene glycol-electrolytes, acetaminophen, amLODipine, cloNIDine, Vitamin D (Ergocalciferol), and potassium chloride SA.  Pharmacotherapy (Medications Ordered): Meds ordered this encounter  Medications  . gabapentin (NEURONTIN) 100 MG capsule    Sig: Take 1-3 capsules (100-300 mg total) by mouth at bedtime for 30 days. Start w/ 1 cap HS and increase dose by 1 cap q 7 days.    Dispense:  90 capsule    Refill:  0    Do not place medication on "Automatic Refill". Fill one day early if pharmacy is closed on scheduled refill date.   Orders:  Orders Placed This Encounter  Procedures  . Cervical Epidural Injection    Level(s): C7-T1 Laterality: Left-sided Purpose: Diagnostic Indication(s): Radiculitis and cervicalgia associater with cervical degenerative disc disease.    Standing Status:   Future    Standing Expiration Date:   01/20/2019    Scheduling Instructions:     Procedure: Cervical Epidural Steroid Injection/Block     Sedation: With Sedation.     Timeframe: As soon as schedule allows    Order Specific Question:   Where will this procedure be performed?    Answer:   [facility]    Comments:   by Dr. Dossie Arbour   Follow-up  plan:   Return in about 1 month (around 01/20/2019) for Procedure (w/ sedation): (L) CESI #1, Med-Mgmt, w/ Dr. Dossie Arbour.   I discussed the assessment and treatment plan with the patient. The patient was provided an opportunity to ask questions and all were answered. The patient agreed with the plan and demonstrated an understanding of the instructions.  Patient advised to call back or seek an in-person evaluation if the symptoms or condition worsens.  Total duration of non-face-to-face encounter: 40 minutes.  Note by: Gaspar Cola, MD Date: 12/21/2018; Time: 11:27 AM  Disclaimer:  * Given the special circumstances of the COVID-19 pandemic, the federal government has announced that the Office for Civil Rights (OCR) will exercise its enforcement discretion and will not impose penalties on physicians using telehealth in the event of noncompliance with regulatory requirements under the [place] and Accountability Act (HIPAA) in connection with the good faith provision of telehealth during the PXTGG-26 national public health emergency. ([place])

## 2018-12-21 NOTE — Patient Instructions (Signed)
____________________________________________________________________________________________  Initial Gabapentin Titration  Medication used: Gabapentin (Generic Name) or Neurontin (Brand Name) 100 mg tablets/capsules  Reasons to stop increasing the dose:  Reason 1: You get good relief of symptoms, in which case there is no need to increase the daily dose any further.    Reason 2: You develop some side effects, such as sleeping all of the time, difficulty concentrating, or becoming disoriented, in which case you need to go down on the dose, to the prior level, where you were not experiencing any side effects. Stay on that dose longer, to allow more time for your body to get use it, before attempting to increase it again.   Steps: Step 1: Start by taking 1 (one) tablet at bedtime x 7 (seven) days.  Step 2: After being on 1 (one) tablet for 7 (seven) days, then increase it to 2 (two) tablets at bedtime for another 7 (seven) days.  Step 3: Next, after being on 2 (two) tablets at bedtime for 7 (seven) days, then increase it to 3 (three) tablets at bedtime, and stay on that dose until you see your doctor.  Reasons to stop increasing the dose: Reason 1: You get good relief of symptoms, in which case there is no need to increase the daily dose any further.  Reason 2: You develop some side effects, such as sleeping all of the time, difficulty concentrating, or becoming disoriented, in which case you need to go down on the dose, to the prior level, where you were not experiencing any side effects. Stay on that dose longer, to allow more time for your body to get use it, before attempting to increase it again.  Endpoint: Once you have reached the maximum dose you can tolerate without side-effects, contact your physician so as to evaluate the results of the regimen.   Questions: Feel free to contact us for any questions or problems at [phone]  [phone] ____________________________________________________________________________________________ ____________________________________________________________________________________________  Preparing for Procedure with Sedation  Procedure appointments are limited to planned procedures: . No Prescription Refills. . No disability issues will be discussed. . No medication changes will be discussed.  Instructions: . Oral Intake: Do not eat or drink anything for at least 8 hours prior to your procedure. . Transportation: Public transportation is not allowed. Bring an adult driver. The driver must be physically present in our waiting room before any procedure can be started. [place] Physical Assistance: Bring an adult physically capable of assisting you, in the event you need help. This adult should keep you company at home for at least 6 hours after the procedure. . Blood Pressure Medicine: Take your blood pressure medicine with a sip of water the morning of the procedure. . Blood thinners: Notify our staff if you are taking any blood thinners. Depending on which one you take, there will be specific instructions on how and when to stop it. . Diabetics on insulin: Notify the staff so that you can be scheduled 1st case in the morning. If your diabetes requires high dose insulin, take only  of your normal insulin dose the morning of the procedure and notify the staff that you have done so. . Preventing infections: Shower with an antibacterial soap the morning of your procedure. . Build-up your immune system: Take 1000 mg of Vitamin C with every meal (3 times a day) the day prior to your procedure. Marland Kitchen Antibiotics: Inform the staff if you have a condition or reason that requires you to take antibiotics before dental procedures. Marland Kitchen  Pregnancy: If you are pregnant, call and cancel the procedure. . Sickness: If you have a cold, fever, or any active infections, call and cancel the procedure. . Arrival: You  must be in the facility at least 30 minutes prior to your scheduled procedure. . Children: Do not bring children with you. . Dress appropriately: Bring dark clothing that you would not mind if they get stained. . Valuables: Do not bring any jewelry or valuables.  Reasons to call and reschedule or cancel your procedure: (Following these recommendations will minimize the risk of a serious complication.) . Surgeries: Avoid having procedures within 2 weeks of any surgery. (Avoid for 2 weeks before or after any surgery). . Flu Shots: Avoid having procedures within 2 weeks of a flu shots or . (Avoid for 2 weeks before or after immunizations). . Barium: Avoid having a procedure within 7-10 days after having had a radiological study involving the use of radiological contrast. (Myelograms, Barium swallow or enema study). . Heart attacks: Avoid any elective procedures or surgeries for the initial 6 months after a "Myocardial Infarction" (Heart Attack). . Blood thinners: It is imperative that you stop these medications before procedures. Let us know if you if you take any blood thinner.  . Infection: Avoid procedures during or within two weeks of an infection (including chest colds or gastrointestinal problems). Symptoms associated with infections include: Localized redness, fever, chills, night sweats or profuse sweating, burning sensation when voiding, cough, congestion, stuffiness, runny nose, sore throat, diarrhea, nausea, vomiting, cold or Flu symptoms, recent or current infections. It is specially important if the infection is over the area that we intend to treat. Marland Kitchen Heart and lung problems: Symptoms that may suggest an active cardiopulmonary problem include: cough, chest pain, breathing difficulties or shortness of breath, dizziness, ankle swelling, uncontrolled high or unusually low blood pressure, and/or palpitations. If you are experiencing any of these symptoms, cancel your procedure and contact your  primary care physician for an evaluation.  Remember:  Regular Business hours are:  Monday to Thursday 8:00 AM to 4:00 PM  Provider's Schedule: Milinda Pointer, MD:  Procedure days: Tuesday and Thursday 7:30 AM to 4:00 PM  Gillis Santa, MD:  Procedure days: Monday and Wednesday 7:30 AM to 4:00 PM ____________________________________________________________________________________________

## 2019-01-18 ENCOUNTER — Encounter: Payer: Self-pay | Admitting: Pain Medicine

## 2019-01-18 ENCOUNTER — Encounter: Payer: Medicare Other | Admitting: Pain Medicine

## 2019-01-18 NOTE — Progress Notes (Signed)
Pain Management Virtual Encounter Note - Virtual Visit via Telephone Telehealth (real-time audio visits between healthcare provider and patient).  Patient's Phone No. & Preferred Pharmacy:  [phone] (home); [phone] (mobile); (Preferred) [phone] No e-mail address on record  [place], [place] - [address] [place] [address] Phone: [phone] Fax: [phone]   Pre-screening note:  Our staff contacted Carl [name] and offered him an "in person", "face-to-face" appointment versus a telephone encounter. He indicated preferring the telephone encounter, at this time.  Reason for Virtual Visit: COVID-19*  Social distancing based on CDC and AMA recommendations.   I contacted Carl [name] on 01/19/2019 at 9:49 AM via telephone.      I clearly identified myself as Gaspar Cola, MD. I verified that I was speaking with the correct person using two identifiers (Name and date of birth: 1945/11/02).  Advanced Informed Consent I sought verbal advanced consent from Carl [name] for virtual visit interactions. I informed Carl [name] of possible security and privacy concerns, risks, and limitations associated with providing "not-in-person" medical evaluation and management services. I also informed Carl [name] of the availability of "in-person" appointments. Finally, I informed him that there would be a charge for the virtual visit and that he could be  personally, fully or partially, financially responsible for it. Carl [name] expressed understanding and agreed to proceed.   Historic Elements   Carl [name] is a 73 y.o. year old, male patient evaluated today after his last encounter by our practice on 12/21/2018. 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 He also  has a past surgical history that includes Cystogram (03/08); Esophagogastroduodenoscopy (egd) with propofol (N/A, 06/20/2017); and Colonoscopy with propofol (N/A, 06/05/2018). Carl [name] has a current medication list which includes the following prescription(s): acetaminophen, atorvastatin, azelastine, carvedilol, doxazosin, ferrous sulfate, fluticasone, gabapentin, hydralazine, pantoprazole, potassium chloride sa, and vitamin d (ergocalciferol). He  reports that he quit smoking about 40 years ago. He has never used smokeless tobacco. He reports that he does not drink alcohol or use drugs. Carl [name] is allergic to ace inhibitors; aspirin; hydrochlorothiazide; lisinopril; nsaids; and ramipril.   HPI  I last communicated with him on 12/21/2018. Today, he is being contacted for medication management.  Indicates that he continues to have the headaches, and he has not noticed any significant benefit with the gabapentin at 300 mg at bedtime.  However, he is also not experiencing any side effects to it and therefore we will continue to titrate this medication up as tolerated.  Today I will go ahead and send a prescription to the pharmacy for gabapentin 300 mg to be taken at bedtime and to slowly increase it to 900 mg at bedtime.  I will call him back in 2 to 3 weeks to see how he is doing.  If he still doing okay and not experiencing any side effects but  he still also not getting any significant benefit, I will continue to increase  it until we reached 3200 mg/day or he experiences any side effects of the medication.  Hopefully he will obtain some benefit before we get to either one of those.  Pharmacotherapy Assessment  Analgesic: Gabapentin 100-300 mg at bedtime  Monitoring: Pharmacotherapy: No side-effects or adverse reactions reported. Carl [name]: PDMP reviewed during this encounter.          Compliance: No problems identified or detected. Plan: Refer to "POC".  Review of recent tests  CT Cervical Spine Wo Contrast CLINICAL DATA:  Headache for 4 years, gradually worsening. Left neck pain. Occipital pain.  EXAM: CT HEAD WITHOUT CONTRAST  CT CERVICAL SPINE WITHOUT CONTRAST  TECHNIQUE: Multidetector CT imaging of the head and cervical spine was performed following the standard protocol without intravenous contrast. Multiplanar CT image reconstructions of the cervical spine were also generated.  COMPARISON:  10/15/2011 head CT  FINDINGS: CT HEAD FINDINGS  Brain: No evidence of acute infarction, hemorrhage, hydrocephalus, extra-axial collection or mass lesion/mass effect.  Chronic microvascular disease with mild for age ischemic gliosis in the cerebral white matter. Cavity in the left thalamus which was related to hemorrhage in 2013. There is more lateral and inferior lacune. Normal brain volume.  Vascular: Atherosclerotic calcification. No hyperdense vessel suspected.  Skull: No acute finding  Sinuses/Orbits: Negative globes.  CT CERVICAL SPINE FINDINGS  Alignment: Straightening with mild anterolisthesis at C3-4, facet mediated.  Skull base and vertebrae: No acute fracture. No primary bone lesion or focal pathologic process.  Soft tissues and spinal canal: No evidence of inflammation or mass. No evidence of epidural hematoma. 15 mm left thyroid nodule.  Disc levels: Degenerative disc narrowing that is advanced  from C3-4 to C6-7, with spurring. Uncovertebral spurs narrow the bilateral foramina, up to moderate at C3-4. No evidence of cord impingement. Facet arthropathy with spurring greatest at C3-4.  Upper chest: No acute finding  IMPRESSION: 1. No acute intracranial or cervical spine finding. 2. 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 3. Advanced cervical disc degeneration from C3-4 to C6-7. Uncovertebral spurs cause up to moderate foraminal narrowing on the left at C3-4. 4. 15 mm left thyroid nodule, at the size threshold where outpatient follow-up sonography is recommended.  Electronically Signed   By: Monte Fantasia M.D.   On: 12/06/2016 14:57   CT Head Wo Contrast CLINICAL DATA:  Headache for 4 years, gradually worsening. Left neck pain. Occipital pain.  EXAM: CT HEAD WITHOUT CONTRAST  CT CERVICAL SPINE WITHOUT CONTRAST  TECHNIQUE: Multidetector CT imaging of the head and cervical spine was performed following the standard protocol without intravenous contrast. Multiplanar CT image reconstructions of the cervical spine were also generated.  COMPARISON:  10/15/2011 head CT  FINDINGS: CT HEAD FINDINGS  Brain: No evidence of acute infarction, hemorrhage, hydrocephalus, extra-axial collection or mass lesion/mass effect.  Chronic microvascular disease with mild for age ischemic gliosis in the cerebral white matter. Cavity in the left thalamus which was related to hemorrhage in 2013. There is more lateral and inferior lacune. Normal brain volume.  Vascular: Atherosclerotic calcification. No hyperdense vessel suspected.  Skull: No acute finding  Sinuses/Orbits: Negative globes.  CT CERVICAL SPINE FINDINGS  Alignment: Straightening with mild anterolisthesis at C3-4, facet mediated.  Skull base and vertebrae: No acute fracture. No primary bone lesion or focal pathologic process.  Soft tissues and spinal canal: No evidence of  inflammation or mass. No evidence of epidural hematoma. 15 mm left thyroid nodule.  Disc levels: Degenerative disc narrowing that is  advanced from C3-4 to C6-7, with spurring. Uncovertebral spurs narrow the bilateral foramina, up to moderate at C3-4. No evidence of cord impingement. Facet arthropathy with spurring greatest at C3-4.  Upper chest: No acute finding  IMPRESSION: 1. No acute intracranial or cervical spine finding. 2. 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 3. Advanced cervical disc degeneration from C3-4 to C6-7. Uncovertebral spurs cause up to moderate foraminal narrowing on the left at C3-4. 4. 15 mm left thyroid nodule, at the size threshold where outpatient follow-up sonography is recommended.  Electronically Signed   By: Monte Fantasia M.D.   On: 12/06/2016 14:57   Office Visit on 07/09/2018  Component Date Value Ref Range Status  . Summary 07/09/2018 FINAL   Final   Comment: ==================================================================== TOXASSURE COMP DRUG ANALYSIS,UR ==================================================================== Test                             Result       Flag       Units Drug Present and Declared for Prescription Verification   Gabapentin                     PRESENT      EXPECTED   Acetaminophen                  PRESENT      EXPECTED   Salicylate                     PRESENT      EXPECTED   Ibuprofen                      PRESENT      EXPECTED   Clonidine                      PRESENT      EXPECTED Drug Absent but Declared for Prescription Verification   Hydrocodone                    Not Detected UNEXPECTED ng/mg creat   Duloxetine                     Not Detected UNEXPECTED   Verapamil                      Not Detected UNEXPECTED ==================================================================== Test                      Result    Flag   Units      Ref Range   Creatinine              160               mg/dL                                >=20 ==================================================================== Declared Medications:  The flagging and interpretation on this report are based on the  following declared medications.  Unexpected results may arise from  inaccuracies in the declared medications.  **Note: The testing scope of this panel includes these medications:  Clonidine  Duloxetine  Gabapentin  Hydrocodone (Hydrocodone-Acetaminophen)  Verapamil  **Note: The testing scope of this panel does not include small to  moderate amounts of these  reported medications:  Acetaminophen  Acetaminophen (Excedrin)  Acetaminophen (Hydrocodone-Acetaminophen)  Aspirin (Excedrin)  Ibuprofen  **Note: The testing scope of this panel does not include following  reported medications:  Amlodipine  Amlodipine Besylate  Atorvastatin  Caffeine (Excedrin)  Carvedilol  Cyanocobalamin  Doxazosin  Hydralazine  Iron (Ferrous Sulfate)  Losartan  Pantoprazole  Polyethylene Glycol  Potassium  Vitamin D2                           (Ergocalciferol) ==================================================================== For clinical consultation, please call [phone]. ====================================================================   . Glucose 07/09/2018 102* 65 - 99 mg/dL Final  . BUN 07/09/2018 23  8 - 27 mg/dL Final  . Creatinine, Ser 07/09/2018 1.10  0.76 - 1.27 mg/dL Final  . GFR calc non Af Amer 07/09/2018 67  >59 mL/min/1.73 Final  . GFR calc Af Amer 07/09/2018 78  >59 mL/min/1.73 Final  . BUN/Creatinine Ratio 07/09/2018 21  10 - 24 Final  . Sodium 07/09/2018 144  134 - 144 mmol/L Final  . Potassium 07/09/2018 4.0  3.5 - 5.2 mmol/L Final  . Chloride 07/09/2018 107* 96 - 106 mmol/L Final  . Calcium 07/09/2018 9.4  8.6 - 10.2 mg/dL Final  . Total Protein 07/09/2018 6.6  6.0 - 8.5 g/dL Final  . Albumin 07/09/2018 4.1  3.5 - 4.8 g/dL Final  . Globulin, Total  07/09/2018 2.5  1.5 - 4.5 g/dL Final  . Albumin/Globulin Ratio 07/09/2018 1.6  1.2 - 2.2 Final  . Bilirubin Total 07/09/2018 0.3  0.0 - 1.2 mg/dL Final  . Alkaline Phosphatase 07/09/2018 50  39 - 117 IU/L Final  . AST 07/09/2018 17  0 - 40 IU/L Final  . Magnesium 07/09/2018 1.9  1.6 - 2.3 mg/dL Final  . Vitamin B-12 07/09/2018 396  232 - 1,245 pg/mL Final  . Sed Rate 07/09/2018 9  0 - 30 mm/hr Final  . 25-Hydroxy, Vitamin D 07/09/2018 26* ng/mL Final   Comment: Reference Range: All Ages: Target levels 30 - 100   . 25-Hydroxy, Vitamin D-2 07/09/2018 7.2  ng/mL Final  . 25-Hydroxy, Vitamin D-3 07/09/2018 19  ng/mL Final  . CRP 07/09/2018 1  0 - 10 mg/L Final   Assessment  The primary encounter diagnosis was Chronic pain syndrome. Diagnoses of Central pain syndrome, Chronic neck pain (Primary Area of Pain) (Left), Chronic facial pain (Secondary Area of Pain) (Left), DDD (degenerative disc disease), cervical, and Neurogenic pain were also pertinent to this visit.  Plan of Care  I have discontinued Carl [name]'s polyethylene glycol-electrolytes, amLODipine, and cloNIDine. I have also changed his gabapentin. Additionally, I am having him maintain his doxazosin, carvedilol, ferrous sulfate, pantoprazole, atorvastatin, acetaminophen, Vitamin D (Ergocalciferol), potassium chloride SA, azelastine, fluticasone, and hydrALAZINE.  Pharmacotherapy (Medications Ordered): Meds ordered this encounter  Medications  . gabapentin (NEURONTIN) 300 MG capsule    Sig: Take 1-3 capsules (300-900 mg total) by mouth at bedtime for 30 days. Start w/ 1 cap HS and increase dose by 1 cap q 7 days.    Dispense:  90 capsule    Refill:  0    Fill one day early if pharmacy is closed on scheduled refill date. May substitute for generic if available.   Orders:  No orders of the defined types were placed in this encounter.  Follow-up plan:   Return in about 3 weeks (around 02/09/2019) for Med-Mgmt, (Virtual Visit).    I discussed the assessment and treatment plan with  the patient. The patient was provided an opportunity to ask questions and all were answered. The patient agreed with the plan and demonstrated an understanding of the instructions.  Patient advised to call back or seek an in-person evaluation if the symptoms or condition worsens.  Total duration of non-face-to-face encounter: 12 minutes.  Note by: Gaspar Cola, MD Date: 01/19/2019; Time: 9:49 AM  Disclaimer:  * Given the special circumstances of the COVID-19 pandemic, the federal government has announced that the Office for Civil Rights (OCR) will exercise its enforcement discretion and will not impose penalties on physicians using telehealth in the event of noncompliance with regulatory requirements under the [place] (HIPAA) in connection with the good faith provision of telehealth during the FVCBS-49 national public health emergency. ([place])

## 2019-01-19 ENCOUNTER — Ambulatory Visit: Payer: Medicare Other | Attending: Pain Medicine | Admitting: Pain Medicine

## 2019-01-19 ENCOUNTER — Other Ambulatory Visit: Payer: Self-pay

## 2019-01-19 DIAGNOSIS — M542 Cervicalgia: Secondary | ICD-10-CM | POA: Diagnosis not present

## 2019-01-19 DIAGNOSIS — R51 Headache: Secondary | ICD-10-CM | POA: Diagnosis not present

## 2019-01-19 DIAGNOSIS — G8929 Other chronic pain: Secondary | ICD-10-CM

## 2019-01-19 DIAGNOSIS — R519 Headache, unspecified: Secondary | ICD-10-CM

## 2019-01-19 DIAGNOSIS — G894 Chronic pain syndrome: Secondary | ICD-10-CM | POA: Diagnosis not present

## 2019-01-19 DIAGNOSIS — G890 Central pain syndrome: Secondary | ICD-10-CM | POA: Diagnosis not present

## 2019-01-19 DIAGNOSIS — M5030 Other cervical disc degeneration, unspecified cervical region: Secondary | ICD-10-CM

## 2019-01-19 DIAGNOSIS — M792 Neuralgia and neuritis, unspecified: Secondary | ICD-10-CM

## 2019-01-19 MED ORDER — GABAPENTIN 300 MG PO CAPS: 300.0000 mg | ORAL_CAPSULE | Freq: Every day | ORAL | 0 refills | Status: DC

## 2019-02-05 ENCOUNTER — Encounter: Payer: Self-pay | Admitting: Pain Medicine

## 2019-02-07 NOTE — Progress Notes (Signed)
Pain Management Virtual Encounter Note - Virtual Visit via Telephone Telehealth (real-time audio visits between healthcare provider and patient).   Patient's Phone No. & Preferred Pharmacy:  [phone] (home); [phone] (mobile); (Preferred) [phone] No e-mail address on record  [place], [place] - [address] [place] [address] Phone: [phone] Fax: [phone]    Pre-screening note:  Our staff contacted Carl [name] and offered him an "in person", "face-to-face" appointment versus a telephone encounter. He indicated preferring the telephone encounter, at this time.   Reason for Virtual Visit: COVID-19*  Social distancing based on CDC and AMA recommendations.   I contacted Carl [name] on 02/08/2019 at 9:01 AM via telephone.      I clearly identified myself as Gaspar Cola, MD. I verified that I was speaking with the correct person using two identifiers (Name: Carl [name], and date of birth: 05/14/1946).  Advanced Informed Consent I sought verbal advanced consent from Carl [name] for virtual visit interactions. I informed Carl [name] of possible security and privacy concerns, risks, and limitations associated with providing "not-in-person" medical evaluation and management services. I also informed Carl [name] of the availability of "in-person" appointments. Finally, I informed him that there would be a charge for the virtual visit and that he could be  personally, fully or partially, financially responsible for it. Carl [name] expressed understanding and agreed to proceed.   Historic Elements   Carl [name] is a 73 y.o. year old, male patient evaluated today after his last encounter by our practice on 01/19/2019. Carl [name]  has a past medical history of Adenoma of colon, Alcoholism ([place]), Anxiety and depression, Cataract, Cervical spondylosis without myelopathy, Cervicalgia of occipito-atlanto-axial region, Chronic pain disorder,  Degeneration of cervical intervertebral disc, Degeneration, intervertebral disc, cervical, Duodenal ulcer (2011), Facet arthropathy, cervical, Fuchs' corneal dystrophy, Gastric ulcer (2011), GI bleed, Gout, Hypertension, Impaired fasting glucose, Intractable 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, LVH (left ventricular hypertrophy), Mild cognitive impairment, Mitral insufficiency, Neck pain, Neuralgia and neuritis, Nocturia, Occipital neuralgia, Osteoarthritis, Osteoarthritis, Pure hypercholesterolemia, Radiculitis, Stroke ([place]), and Tricuspid insufficiency. He also  has a past surgical history that includes Cystogram (03/08); Esophagogastroduodenoscopy (egd) with propofol (N/A, 06/20/2017); and Colonoscopy with propofol (N/A, 06/05/2018). Carl [name] has a current medication list which includes the following prescription(s): acetaminophen, atorvastatin, azelastine, carvedilol, doxazosin, ferrous sulfate, fluticasone, gabapentin, hydralazine, pantoprazole, potassium chloride sa, and vitamin d (ergocalciferol). He  reports that he quit smoking about 40 years ago. He has never used smokeless tobacco. He reports that he does not drink alcohol or use drugs. Carl [name] is allergic to ace inhibitors; aspirin; hydrochlorothiazide; lisinopril; nsaids; and ramipril.   HPI  Today, he is being contacted for medication management.  The patient is indicating having gone up on his gabapentin to 300 mg, 3 tablets at bedtime.  He admits to having some issues with being somnolent.  I have instructed him to stay on the at bedtime regiment until he has created tolerance to the medicine and it no longer makes him sleepy.  Today to simplify his regimen we will be changing his gabapentin pills from the 300 mg to the 800 mg and he has been instructed today to take only 1 tablet at bedtime.  I have given him refills for 90 days and I will call him back in 3 months for follow-up and to determine whether or not he is at  a point where he can start taking  some urine today.  However, it is likely that we will do the titration with 100 mg pills.  Unfortunately, he is still taking Tylenol 500 mg every 4 hours and ibuprofen 200 mg every 4 hours.  I have given him the appropriate warning regarding this and he realizes that the Tylenol is bad for his liver and the ibuprofen is bad for his kidneys, but he describes that he feels he needs to continue taking it.  Today he has indicated that his neck pain and headaches are associated with his cervical spine position.  This would suggest that he does have a certain degree of cervical facet syndrome which we will need to look at in the future as a possible treatment option with cervical facet blocks.  Pharmacotherapy Assessment  Analgesic: Gabapentin titration  Monitoring: Pharmacotherapy: No side-effects or adverse reactions reported. Carl [name] PMP: PDMP reviewed during this encounter.       Compliance: No problems identified. Effectiveness: Clinically acceptable. Plan: Refer to "POC".  Pertinent Labs   SAFETY SCREENING Profile Lab Results  Component Value Date   MRSAPCR NEGATIVE 10/08/2011   Renal Function Lab Results  Component Value Date   BUN 23 07/09/2018   CREATININE 1.10 07/09/2018   BCR 21 07/09/2018   GFRAA 78 07/09/2018   GFRNONAA 67 07/09/2018   Hepatic Function Lab Results  Component Value Date   AST 17 07/09/2018   ALT 18 06/19/2017   ALBUMIN 4.1 07/09/2018   UDS Summary  Date Value Ref Range Status  07/09/2018 FINAL  Final    Comment:    ==================================================================== TOXASSURE COMP DRUG ANALYSIS,UR ==================================================================== Test                             Result       Flag       Units Drug Present and Declared for Prescription Verification   Gabapentin                     PRESENT      EXPECTED   Acetaminophen                  PRESENT      EXPECTED   Salicylate                      PRESENT      EXPECTED   Ibuprofen                      PRESENT      EXPECTED   Clonidine                      PRESENT      EXPECTED Drug Absent but Declared for Prescription Verification   Hydrocodone                    Not Detected UNEXPECTED ng/mg creat   Duloxetine                     Not Detected UNEXPECTED   Verapamil                      Not Detected UNEXPECTED ==================================================================== Test                      Result    Flag   Units  Ref Range   Creatinine              160              mg/dL      >=20 ==================================================================== Declared Medications:  The flagging and interpretation on this report are based on the  following declared medications.  Unexpected results may arise from  inaccuracies in the declared medications.  **Note: The testing scope of this panel includes these medications:  Clonidine  Duloxetine  Gabapentin  Hydrocodone (Hydrocodone-Acetaminophen)  Verapamil  **Note: The testing scope of this panel does not include small to  moderate amounts of these reported medications:  Acetaminophen  Acetaminophen (Excedrin)  Acetaminophen (Hydrocodone-Acetaminophen)  Aspirin (Excedrin)  Ibuprofen  **Note: The testing scope of this panel does not include following  reported medications:  Amlodipine  Amlodipine Besylate  Atorvastatin  Caffeine (Excedrin)  Carvedilol  Cyanocobalamin  Doxazosin  Hydralazine  Iron (Ferrous Sulfate)  Losartan  Pantoprazole  Polyethylene Glycol  Potassium  Vitamin D2 (Ergocalciferol) ==================================================================== For clinical consultation, please call [phone]. ====================================================================    Note: Above Lab results reviewed.  Recent imaging  CT Cervical Spine Wo Contrast CLINICAL DATA:  Headache for 4 years, gradually worsening. Left  neck pain. Occipital pain.  EXAM: CT HEAD WITHOUT CONTRAST  CT CERVICAL SPINE WITHOUT CONTRAST  TECHNIQUE: Multidetector CT imaging of the head and cervical spine was performed following the standard protocol without intravenous contrast. Multiplanar CT image reconstructions of the cervical spine were also generated.  COMPARISON:  10/15/2011 head CT  FINDINGS: CT HEAD FINDINGS  Brain: No evidence of acute infarction, hemorrhage, hydrocephalus, extra-axial collection or mass lesion/mass effect.  Chronic microvascular disease with mild for age ischemic gliosis in the cerebral white matter. Cavity in the left thalamus which was related to hemorrhage in 2013. There is more lateral and inferior lacune. Normal brain volume.  Vascular: Atherosclerotic calcification. No hyperdense vessel suspected.  Skull: No acute finding  Sinuses/Orbits: Negative globes.  CT CERVICAL SPINE FINDINGS  Alignment: Straightening with mild anterolisthesis at C3-4, facet mediated.  Skull base and vertebrae: No acute fracture. No primary bone lesion or focal pathologic process.  Soft tissues and spinal canal: No evidence of inflammation or mass. No evidence of epidural hematoma. 15 mm left thyroid nodule.  Disc levels: Degenerative disc narrowing that is advanced from C3-4 to C6-7, with spurring. Uncovertebral spurs narrow the bilateral foramina, up to moderate at C3-4. No evidence of cord impingement. Facet arthropathy with spurring greatest at C3-4.  Upper chest: No acute finding  IMPRESSION: 1. No acute intracranial or cervical spine finding. 2. 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 3. Advanced cervical disc degeneration from C3-4 to C6-7. Uncovertebral spurs cause up to moderate foraminal narrowing on the left at C3-4. 4. 15 mm left thyroid nodule, at the size threshold where outpatient follow-up sonography is recommended.  Electronically  Signed   By: Monte Fantasia M.D.   On: 12/06/2016 14:57 CT Head Wo Contrast CLINICAL DATA:  Headache for 4 years, gradually worsening. Left neck pain. Occipital pain.  EXAM: CT HEAD WITHOUT CONTRAST  CT CERVICAL SPINE WITHOUT CONTRAST  TECHNIQUE: Multidetector CT imaging of the head and cervical spine was performed following the standard protocol without intravenous contrast. Multiplanar CT image reconstructions of the cervical spine were also generated.  COMPARISON:  10/15/2011 head CT  FINDINGS: CT HEAD FINDINGS  Brain: No evidence of acute infarction, hemorrhage, hydrocephalus, extra-axial collection or mass lesion/mass effect.  Chronic  microvascular disease with mild for age ischemic gliosis in the cerebral white matter. Cavity in the left thalamus which was related to hemorrhage in 2013. There is more lateral and inferior lacune. Normal brain volume.  Vascular: Atherosclerotic calcification. No hyperdense vessel suspected.  Skull: No acute finding  Sinuses/Orbits: Negative globes.  CT CERVICAL SPINE FINDINGS  Alignment: Straightening with mild anterolisthesis at C3-4, facet mediated.  Skull base and vertebrae: No acute fracture. No primary bone lesion or focal pathologic process.  Soft tissues and spinal canal: No evidence of inflammation or mass. No evidence of epidural hematoma. 15 mm left thyroid nodule.  Disc levels: Degenerative disc narrowing that is advanced from C3-4 to C6-7, with spurring. Uncovertebral spurs narrow the bilateral foramina, up to moderate at C3-4. No evidence of cord impingement. Facet arthropathy with spurring greatest at C3-4.  Upper chest: No acute finding  IMPRESSION: 1. No acute intracranial or cervical spine finding. 2. 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 3. Advanced cervical disc degeneration from C3-4 to C6-7. Uncovertebral spurs cause up to moderate foraminal narrowing on  the left at C3-4. 4. 15 mm left thyroid nodule, at the size threshold where outpatient follow-up sonography is recommended.  Electronically Signed   By: Monte Fantasia M.D.   On: 12/06/2016 14:57  Assessment  The primary encounter diagnosis was Chronic pain syndrome. Diagnoses of Chronic neck pain (Primary Area of Pain) (Left), Cervicalgia of occipito-atlanto-axial region, Cervical facet arthropathy, Chronic facial pain (Secondary Area of Pain) (Left), DDD (degenerative disc disease), cervical, Neurogenic pain, and Central pain syndrome were also pertinent to this visit.  Plan of Care  I have discontinued Criston A. Burnette's gabapentin. I am also having him start on gabapentin. Additionally, I am having him maintain his doxazosin, carvedilol, ferrous sulfate, pantoprazole, atorvastatin, acetaminophen, Vitamin D (Ergocalciferol), potassium chloride SA, azelastine, fluticasone, and hydrALAZINE.  Pharmacotherapy (Medications Ordered): Meds ordered this encounter  Medications  . gabapentin (NEURONTIN) 800 MG tablet    Sig: Take 1 tablet (800 mg total) by mouth at bedtime. Must last 30 days    Dispense:  30 tablet    Refill:  2    Fill one day early if pharmacy is closed on scheduled refill date. May substitute for generic if available.   Orders:  No orders of the defined types were placed in this encounter.  Follow-up plan:   Return for (3 mo) Med-Mgmt, (Virtual Visit).    I discussed the assessment and treatment plan with the patient. The patient was provided an opportunity to ask questions and all were answered. The patient agreed with the plan and demonstrated an understanding of the instructions.  Patient advised to call back or seek an in-person evaluation if the symptoms or condition worsens.  Total duration of non-face-to-face encounter: 12 minutes.  Note by: Gaspar Cola, MD Date: 02/08/2019; Time: 9:01 AM  Note: This dictation was prepared with Dragon dictation. Any  transcriptional errors that may result from this process are unintentional.  Disclaimer:  * Given the special circumstances of the COVID-19 pandemic, the federal government has announced that the Office for Civil Rights (OCR) will exercise its enforcement discretion and will not impose penalties on physicians using telehealth in the event of noncompliance with regulatory requirements under the [place] (HIPAA) in connection with the good faith provision of telehealth during the VOJJK-09 national public health emergency. ([place])

## 2019-02-08 ENCOUNTER — Other Ambulatory Visit: Payer: Self-pay

## 2019-02-08 ENCOUNTER — Ambulatory Visit: Payer: Medicare Other | Attending: Pain Medicine | Admitting: Pain Medicine

## 2019-02-08 ENCOUNTER — Other Ambulatory Visit: Payer: Self-pay | Admitting: Pain Medicine

## 2019-02-08 DIAGNOSIS — R51 Headache: Secondary | ICD-10-CM

## 2019-02-08 DIAGNOSIS — M542 Cervicalgia: Secondary | ICD-10-CM | POA: Diagnosis not present

## 2019-02-08 DIAGNOSIS — M792 Neuralgia and neuritis, unspecified: Secondary | ICD-10-CM

## 2019-02-08 DIAGNOSIS — G894 Chronic pain syndrome: Secondary | ICD-10-CM

## 2019-02-08 DIAGNOSIS — M47812 Spondylosis without myelopathy or radiculopathy, cervical region: Secondary | ICD-10-CM

## 2019-02-08 DIAGNOSIS — G890 Central pain syndrome: Secondary | ICD-10-CM

## 2019-02-08 DIAGNOSIS — G8929 Other chronic pain: Secondary | ICD-10-CM

## 2019-02-08 DIAGNOSIS — M5030 Other cervical disc degeneration, unspecified cervical region: Secondary | ICD-10-CM

## 2019-02-08 MED ORDER — GABAPENTIN 800 MG PO TABS: 800.0000 mg | ORAL_TABLET | Freq: Every day | ORAL | 2 refills | Status: DC

## 2019-02-15 ENCOUNTER — Other Ambulatory Visit
Admission: RE | Admit: 2019-02-15 | Discharge: 2019-02-15 | Disposition: A | Discharge status: Home / Self Care | Payer: Medicare Other | Source: Ambulatory Visit | Attending: Pain Medicine | Admitting: Pain Medicine

## 2019-02-15 ENCOUNTER — Other Ambulatory Visit: Payer: Self-pay

## 2019-02-15 DIAGNOSIS — Z1159 Encounter for screening for other viral diseases: Secondary | ICD-10-CM | POA: Diagnosis present

## 2019-02-16 LAB — NOVEL CORONAVIRUS, NAA (HOSP ORDER, SEND-OUT TO REF LAB; TAT 18-24 HRS): SARS-CoV-2, NAA: NOT DETECTED

## 2019-02-17 NOTE — Progress Notes (Signed)
Patient's Name: Carl [name]  MRN: [id number]  Referring Provider: Hortencia Pilar, MD  DOB: [birth year]  PCP: Hortencia Pilar, MD  DOS: 02/18/2019  Note by: Gaspar Cola, MD  Service setting: Ambulatory outpatient  Specialty: Interventional Pain Management  Patient type: Established  Location: [facility] (AMB) Pain Management Facility  Visit type: Interventional Procedure   Primary Reason for Visit: Interventional Pain Management Treatment. CC: Neck Pain (bilateral, mainly left up to the skull )  Procedure:          Anesthesia, Analgesia, Anxiolysis:  Type: Diagnostic, Inter-Laminar, Cervical Epidural Steroid Injection  #1  Region: Posterior Cervico-thoracic Region Level: C7-T1 Laterality: Left-Sided Paramedial  Type: Local Anesthesia Indication(s): Analgesia         Route: Infiltration (Kennard/IM) IV Access: Declined Sedation: None since NPO instructions were not followed  Local Anesthetic: Lidocaine 1-2%  Position: Prone with head of the table was raised to facilitate breathing.   Indications: 1. Cervicalgia   2. Cervical spondylosis without myelopathy   3. Cervicalgia of occipito-atlanto-axial region   4. Cervical facet arthropathy   5. DDD (degenerative disc disease), cervical    Pain Score: Pre-procedure: 4 (took tylenol and ibuprofen @ 0700 and another tylenol approx 0845)/10 Post-procedure: 0-No pain/10  Pre-op Assessment:  Carl [name] is a 73 y.o. (year old), male patient, seen today for interventional treatment. He  has a past surgical history that includes Cystogram (03/08); Esophagogastroduodenoscopy (egd) with propofol (N/A, 06/20/2017); and Colonoscopy with propofol (N/A, 06/05/2018). Carl [name] has a current medication list which includes the following prescription(s): acetaminophen, aspirin-acetaminophen-caffeine, atorvastatin, azelastine, carvedilol, doxazosin, ferrous sulfate, fluticasone, gabapentin, hydralazine, ibuprofen, pantoprazole, potassium chloride sa, and vitamin d  (ergocalciferol), and the following Facility-Administered Medications: iohexol and ropivacaine (pf) 2 mg/ml (0.2%). His primarily concern today is the Neck Pain (bilateral, mainly left up to the skull )  Initial Vital Signs:  Pulse/HCG Rate: 76  Temp: 98 F (36.7 C) Resp: 16 BP: 121/75 SpO2: 96 %  BMI: Estimated body mass index is 29.76 kg/m as calculated from the following:   Height as of this encounter: 5\' 7"  (1.702 m).   Weight as of this encounter: 190 lb (86.2 kg).  Risk Assessment: Allergies: Reviewed. He is allergic to ace inhibitors; aspirin; hydrochlorothiazide; lisinopril; nsaids; and ramipril.  Allergy Precautions: None required Coagulopathies: Reviewed. None identified.  Blood-thinner therapy: None at this time Active Infection(s): Reviewed. None identified. Carl [name] is afebrile  Site Confirmation: Carl [name] was asked to confirm the procedure and laterality before marking the site Procedure checklist: Completed Consent: Before the procedure and under the influence of no sedative(s), amnesic(s), or anxiolytics, the patient was informed of the treatment options, risks and possible complications. To fulfill our ethical and legal obligations, as recommended by the American Medical Association's Code of Ethics, I have informed the patient of my clinical impression; the nature and purpose of the treatment or procedure; the risks, benefits, and possible complications of the intervention; the alternatives, including doing nothing; the risk(s) and benefit(s) of the alternative treatment(s) or procedure(s); and the risk(s) and benefit(s) of doing nothing. The patient was provided information about the general risks and possible complications associated with the procedure. These may include, but are not limited to: failure to achieve desired goals, infection, bleeding, organ or nerve damage, allergic reactions, paralysis, and death. In addition, the patient was informed of those risks and  complications associated to Spine-related procedures, such as failure to decrease pain; infection (i.e.: Meningitis, epidural or intraspinal abscess); bleeding (i.e.: epidural  hematoma, subarachnoid hemorrhage, or any other type of intraspinal or peri-dural bleeding); organ or nerve damage (i.e.: Any type of peripheral nerve, nerve root, or spinal cord injury) with subsequent damage to sensory, motor, and/or autonomic systems, resulting in permanent pain, numbness, and/or weakness of one or several areas of the body; allergic reactions; (i.e.: anaphylactic reaction); and/or death. Furthermore, the patient was informed of those risks and complications associated with the medications. These include, but are not limited to: allergic reactions (i.e.: anaphylactic or anaphylactoid reaction(s)); adrenal axis suppression; blood sugar elevation that in diabetics may result in ketoacidosis or comma; water retention that in patients with history of congestive heart failure may result in shortness of breath, pulmonary edema, and decompensation with resultant heart failure; weight gain; swelling or edema; medication-induced neural toxicity; particulate matter embolism and blood vessel occlusion with resultant organ, and/or nervous system infarction; and/or aseptic necrosis of one or more joints. Finally, the patient was informed that Medicine is not an exact science; therefore, there is also the possibility of unforeseen or unpredictable risks and/or possible complications that may result in a catastrophic outcome. The patient indicated having understood very clearly. We have given the patient no guarantees and we have made no promises. Enough time was given to the patient to ask questions, all of which were answered to the patient's satisfaction. Carl [name] has indicated that he wanted to continue with the procedure. Attestation: I, the ordering provider, attest that I have discussed with the patient the benefits, risks,  side-effects, alternatives, likelihood of achieving goals, and potential problems during recovery for the procedure that I have provided informed consent. Date  Time: 02/18/2019  9:28 AM  Pre-Procedure Preparation:  Monitoring: As per clinic protocol. Respiration, ETCO2, SpO2, BP, heart rate and rhythm monitor placed and checked for adequate function Safety Precautions: Patient was assessed for positional comfort and pressure points before starting the procedure. Time-out: I initiated and conducted the "Time-out" before starting the procedure, as per protocol. The patient was asked to participate by confirming the accuracy of the "Time Out" information. Verification of the correct person, site, and procedure were performed and confirmed by me, the nursing staff, and the patient. "Time-out" conducted as per Joint Commission's Universal Protocol (UP.01.01.01). Time: 1003  Description of Procedure:          Target Area: For Epidural Steroid injections the target is the interlaminar space, initially targeting the lower border of the superior vertebral body lamina. Approach: Paramedial approach. Area Prepped: Entire PosteriorCervical Region Prepping solution: DuraPrep (Iodine Povacrylex [0.7% available iodine] and Isopropyl Alcohol, 74% w/w) Safety Precautions: Aspiration looking for blood return was conducted prior to all injections. At no point did we inject any substances, as a needle was being advanced. No attempts were made at seeking any paresthesias. Safe injection practices and needle disposal techniques used. Medications properly checked for expiration dates. SDV (single dose vial) medications used. Description of the Procedure: Protocol guidelines were followed. The procedure needle was introduced through the skin, ipsilateral to the reported pain, and advanced to the target area. Bone was contacted and the needle walked caudad, until the lamina was cleared. The epidural space was identified  using "loss-of-resistance technique" with 2-3 ml of PF-NaCl (0.9% NSS), in a 5cc LOR glass syringe. Vitals:   02/18/19 0923 02/18/19 0955 02/18/19 1005 02/18/19 1014  BP: 121/75 (!) 151/94 (!) 141/109 (!) 143/99  Pulse: 76 73 70 71  Resp: 16 19 19 18   Temp: 98 F (36.7 C)  TempSrc: Oral     SpO2: 96% 93% 97% 98%  Weight: 190 lb (86.2 kg)     Height: 5\' 7"  (1.702 m)       Start Time: 1003 hrs. End Time: 1009 hrs. Materials:  Needle(s) Type: Epidural needle Gauge: 17G Length: 3.5-in Medication(s): Please see orders for medications and dosing details.  Imaging Guidance (Spinal):          Type of Imaging Technique: Fluoroscopy Guidance (Spinal) Indication(s): Assistance in needle guidance and placement for procedures requiring needle placement in or near specific anatomical locations not easily accessible without such assistance. Exposure Time: Please see nurses notes. Contrast: Before injecting any contrast, we confirmed that the patient did not have an allergy to iodine, shellfish, or radiological contrast. Once satisfactory needle placement was completed at the desired level, radiological contrast was injected. Contrast injected under live fluoroscopy. No contrast complications. See chart for type and volume of contrast used. Fluoroscopic Guidance: I was personally present during the use of fluoroscopy. "Tunnel Vision Technique" used to obtain the best possible view of the target area. Parallax error corrected before commencing the procedure. "Direction-depth-direction" technique used to introduce the needle under continuous pulsed fluoroscopy. Once target was reached, antero-posterior, oblique, and lateral fluoroscopic projection used confirm needle placement in all planes. Images permanently stored in EMR. Interpretation: I personally interpreted the imaging intraoperatively. Adequate needle placement confirmed in multiple planes. Appropriate spread of contrast into desired area was  observed. No evidence of afferent or efferent intravascular uptake. No intrathecal or subarachnoid spread observed. Permanent images saved into the patient's record.  Antibiotic Prophylaxis:   Anti-infectives (From admission, onward)   None     Indication(s): None identified  Post-operative Assessment:  Post-procedure Vital Signs:  Pulse/HCG Rate: 71  Temp: 98 F (36.7 C) Resp: 18 BP: (!) 143/99 SpO2: 98 %  EBL: None  Complications: No immediate post-treatment complications observed by team, or reported by patient.  Note: The patient tolerated the entire procedure well. A repeat set of vitals were taken after the procedure and the patient was kept under observation following institutional policy, for this type of procedure. Post-procedural neurological assessment was performed, showing return to baseline, prior to discharge. The patient was provided with post-procedure discharge instructions, including a section on how to identify potential problems. Should any problems arise concerning this procedure, the patient was given instructions to immediately contact us, at any time, without hesitation. In any case, we plan to contact the patient by telephone for a follow-up status report regarding this interventional procedure.  Comments:  No additional relevant information.  Plan of Care  Orders:  Orders Placed This Encounter  Procedures  . Cervical Epidural Injection    Procedure: Cervical Epidural Steroid Injection/Block Purpose: Diagnostic Indication(s): Radiculitis and cervicalgia associater with cervical degenerative disc disease.    Scheduling Instructions:     Level(s): C7-T1     Laterality: Left-sided     Sedation: Patient's choice.     Timeframe: Today    Order Specific Question:   Where will this procedure be performed?    Answer:   [facility]    Comments:   by Dr. Dossie Arbour  . DG PAIN CLIN[facility] C-ARM 1-60 MIN NO REPORT    Intraoperative interpretation by  procedural physician at [place].    Standing Status:   Standing    Number of Occurrences:   1    Order Specific Question:   Reason for exam:    Answer:   Assistance  in needle guidance and placement for procedures requiring needle placement in or near specific anatomical locations not easily accessible without such assistance.  . Provider attestation of informed consent for procedure/surgical case    I, the ordering provider, attest that I have discussed with the patient the benefits, risks, side effects, alternatives, likelihood of achieving goals and potential problems during recovery for the procedure that I have provided informed consent.    Standing Status:   Standing    Number of Occurrences:   1  . Informed Consent Details: Transcribe to consent form and obtain patient signature    Standing Status:   Standing    Number of Occurrences:   1    Order Specific Question:   Procedure    Answer:   Cervical epidural steroid injection under fluoroscopic guidance. (See notes for level and laterality.)    Order Specific Question:   Surgeon    Answer:   Orly Quimby A. Dossie Arbour, MD    Order Specific Question:   Indication/Reason    Answer:   Neck pain and/or upper extremity pain secondary to cervical radiculitis/radiculopathy.   Medications ordered for procedure: Meds ordered this encounter  Medications  . iohexol (OMNIPAQUE) 180 MG/ML injection 10 mL    Must be Myelogram-compatible. If not available, you may substitute with a water-soluble, non-ionic, hypoallergenic, myelogram-compatible radiological contrast medium.  Marland Kitchen lidocaine (XYLOCAINE) 2 % (with pres) injection 400 mg  . DISCONTD: lactated ringers infusion 1,000 mL  . DISCONTD: midazolam (VERSED) 5 MG/5ML injection 1-2 mg    Make sure Flumazenil is available in the pyxis when using this medication. If oversedation occurs, administer 0.2 mg IV over 15 sec. If after 45 sec no response, administer 0.2 mg again over 1 min; may  repeat at 1 min intervals; not to exceed 4 doses (1 mg)  . DISCONTD: fentaNYL (SUBLIMAZE) injection 25-50 mcg    Make sure Narcan is available in the pyxis when using this medication. In the event of respiratory depression (RR< 8/min): Titrate NARCAN (naloxone) in increments of 0.1 to 0.2 mg IV at 2-3 minute intervals, until desired degree of reversal.  . sodium chloride flush (NS) 0.9 % injection 1 mL  . ropivacaine (PF) 2 mg/mL (0.2%) (NAROPIN) injection 1 mL  . dexamethasone (DECADRON) injection 10 mg   Medications administered: We administered lidocaine, sodium chloride flush, and dexamethasone.  See the medical record for exact dosing, route, and time of administration.  Disposition: Discharge home  Discharge Date & Time: 02/18/2019; 1016 hrs.   Follow-up plan:   Return in about 2 weeks (around 03/04/2019) for (VV), E/M (PP).     Future Appointments  Date Time Provider Scott  04/28/2019 10:00 AM Milinda Pointer, MD [facility] None   Primary Care Physician: Hortencia Pilar, MD Location: [facility] Note by: Gaspar Cola, MD Date: 02/18/2019; Time: 10:19 AM  Disclaimer:  Medicine is not an exact science. The only guarantee in medicine is that nothing is guaranteed. It is important to note that the decision to proceed with this intervention was based on the information collected from the patient. The Data and conclusions were drawn from the patient's questionnaire, the interview, and the physical examination. Because the information was provided in large part by the patient, it cannot be guaranteed that it has not been purposely or unconsciously manipulated. Every effort has been made to obtain as much relevant data as possible for this evaluation. It is important to note that the conclusions that lead  to this procedure are derived in large part from the available data. Always take into account that the treatment will also be dependent on  availability of resources and existing treatment guidelines, considered by other Pain Management Practitioners as being common knowledge and practice, at the time of the intervention. For Medico-Legal purposes, it is also important to point out that variation in procedural techniques and pharmacological choices are the acceptable norm. The indications, contraindications, technique, and results of the above procedure should only be interpreted and judged by a Board-Certified Interventional Pain Specialist with extensive familiarity and expertise in the same exact procedure and technique.

## 2019-02-17 NOTE — Patient Instructions (Signed)

## 2019-02-18 ENCOUNTER — Encounter: Payer: Self-pay | Admitting: Pain Medicine

## 2019-02-18 ENCOUNTER — Ambulatory Visit (HOSPITAL_BASED_OUTPATIENT_CLINIC_OR_DEPARTMENT_OTHER): Payer: Medicare Other | Admitting: Pain Medicine

## 2019-02-18 ENCOUNTER — Other Ambulatory Visit: Payer: Self-pay

## 2019-02-18 ENCOUNTER — Ambulatory Visit
Admission: RE | Admit: 2019-02-18 | Discharge: 2019-02-18 | Disposition: A | Discharge status: Home / Self Care | Payer: Medicare Other | Source: Ambulatory Visit | Attending: Pain Medicine | Admitting: Pain Medicine

## 2019-02-18 VITALS — BP 143/99 | HR 71 | Temp 98.0°F | Resp 18 | Ht 67.0 in | Wt 190.0 lb

## 2019-02-18 DIAGNOSIS — M5030 Other cervical disc degeneration, unspecified cervical region: Secondary | ICD-10-CM | POA: Diagnosis present

## 2019-02-18 DIAGNOSIS — M542 Cervicalgia: Secondary | ICD-10-CM

## 2019-02-18 DIAGNOSIS — M47812 Spondylosis without myelopathy or radiculopathy, cervical region: Secondary | ICD-10-CM | POA: Diagnosis not present

## 2019-02-18 MED ORDER — LIDOCAINE HCL 2 % IJ SOLN
20.0000 mL | Freq: Once | INTRAMUSCULAR | Status: AC
  Administered 2019-02-18: 400 mg
  Filled 2019-02-18: qty 20

## 2019-02-18 MED ORDER — DEXAMETHASONE SODIUM PHOSPHATE 10 MG/ML IJ SOLN
10.0000 mg | Freq: Once | INTRAMUSCULAR | Status: AC
  Administered 2019-02-18: 10 mg
  Filled 2019-02-18: qty 1

## 2019-02-18 MED ORDER — SODIUM CHLORIDE 0.9% FLUSH
1.0000 mL | Freq: Once | INTRAVENOUS | Status: AC
  Administered 2019-02-18: 1 mL

## 2019-02-18 MED ORDER — SODIUM CHLORIDE (PF) 0.9 % IJ SOLN
INTRAMUSCULAR | Status: AC
  Filled 2019-02-18: qty 10

## 2019-02-18 MED ORDER — LACTATED RINGERS IV SOLN: 1000.0000 mL | Freq: Once | INTRAVENOUS | Status: DC

## 2019-02-18 MED ORDER — ROPIVACAINE HCL 2 MG/ML IJ SOLN
1.0000 mL | Freq: Once | INTRAMUSCULAR | Status: DC
  Filled 2019-02-18: qty 10

## 2019-02-18 MED ORDER — FENTANYL CITRATE (PF) 100 MCG/2ML IJ SOLN: 25.0000 ug | INTRAMUSCULAR | Status: DC | PRN

## 2019-02-18 MED ORDER — ROPIVACAINE HCL 2 MG/ML IJ SOLN
INTRAMUSCULAR | Status: AC
  Filled 2019-02-18: qty 10

## 2019-02-18 MED ORDER — DEXAMETHASONE SODIUM PHOSPHATE 10 MG/ML IJ SOLN
INTRAMUSCULAR | Status: AC
  Filled 2019-02-18: qty 1

## 2019-02-18 MED ORDER — MIDAZOLAM HCL 5 MG/5ML IJ SOLN: 1.0000 mg | INTRAMUSCULAR | Status: DC | PRN

## 2019-02-18 MED ORDER — IOHEXOL 180 MG/ML  SOLN: 10.0000 mL | Freq: Once | INTRAMUSCULAR | Status: DC

## 2019-02-19 ENCOUNTER — Telehealth: Payer: Self-pay

## 2019-02-19 NOTE — Telephone Encounter (Signed)
Post procedure phone call.  Lm 

## 2019-02-23 ENCOUNTER — Telehealth: Payer: Self-pay | Admitting: Pain Medicine

## 2019-02-23 NOTE — Telephone Encounter (Signed)
Attempted to call patient, message left. 

## 2019-02-23 NOTE — Telephone Encounter (Signed)
Pt states that he had a procedure last Thursday and is now hurting worse than ever before and wants to know if there is anything that can be done for him.

## 2019-02-24 ENCOUNTER — Telehealth: Payer: Self-pay | Admitting: Pain Medicine

## 2019-02-24 NOTE — Telephone Encounter (Signed)
Patient is having pain in the back of his skull on the left side.  Had a terrible headache yesterday.  States it is the same pain that he has had prior to the procedure.  States that it is not any worse than before procedure.  States that he did get good relief the day of the procedure.  Explained to him what is expected after the procedure and for him to call us back for any questions or concerns that he may have. Patient states understanding.

## 2019-02-24 NOTE — Telephone Encounter (Signed)
Pt called again saying he was returning a nurses call about previous symptoms. See previous phone call notes.

## 2019-02-28 ENCOUNTER — Emergency Department
Admission: EM | Admit: 2019-02-28 | Discharge: 2019-02-28 | Discharge status: Against Medical Advice | Payer: Medicare Other

## 2019-02-28 ENCOUNTER — Other Ambulatory Visit: Payer: Self-pay

## 2019-02-28 NOTE — ED Notes (Signed)
Pt back to registration stating that he is going home

## 2019-03-02 ENCOUNTER — Telehealth: Payer: Self-pay

## 2019-03-02 NOTE — Telephone Encounter (Signed)
Attempted to talk with people. No answer. Left message that he needs to give the steroid more time to work 3-10 days and if he wanted to talk with someone he could give [place] a call back.

## 2019-03-02 NOTE — Telephone Encounter (Signed)
Spoke with patient re; c/o head and neck pain.  The head pain is radiating around to face and under the eye.  Patient denies and fever or dizziness.  His last procedure was 02/18/19, cervical interlaminar epidural.  He also reports that he is blowing out green mucous from nose and was seen at [facility] in April where they prescribed him Astelin Nasal spray and Doxycycline.  I did go and talk with Dr Dossie Arbour about Carl [name]'s c/o and s/s.  After review and further discussion with Carl [name] we have recommended that he f/up with his PCP for possible unresolved sinus problems.  Carl [name] u/o information and is going to f/up with PCP and then see [place] on July 15 for f/up.

## 2019-03-02 NOTE — Telephone Encounter (Signed)
Mr Harmsen called and state's pain is no better after having procedure, He' in lot of pain and wants someone to call him

## 2019-03-16 ENCOUNTER — Encounter: Payer: Self-pay | Admitting: Pain Medicine

## 2019-03-16 NOTE — Progress Notes (Signed)
Pain Management Virtual Encounter Note - Virtual Visit via Telephone Telehealth (real-time audio visits between healthcare provider and patient).   Patient's Phone No. & Preferred Pharmacy:  [phone] (home); [phone] (mobile); (Preferred) [phone] No e-mail address on record  [place], [place] - [address] [place] [address] Phone: [phone] Fax: [phone]    Pre-screening note:  Our staff contacted Mr. Hicks and offered him an "in person", "face-to-face" appointment versus a telephone encounter. He indicated preferring the telephone encounter, at this time.   Reason for Virtual Visit: COVID-19*  Social distancing based on CDC and AMA recommendations.   I contacted [name] on 03/17/2019 via telephone.      I clearly identified myself as Gaspar Cola, MD. I verified that I was speaking with the correct person using two identifiers (Name: RASHI GIULIANI, and date of birth: 1946-08-20).  Advanced Informed Consent I sought verbal advanced consent from [name] for virtual visit interactions. I informed Mr. Stecklein of possible security and privacy concerns, risks, and limitations associated with providing "not-in-person" medical evaluation and management services. I also informed Mr. Bovenzi of the availability of "in-person" appointments. Finally, I informed him that there would be a charge for the virtual visit and that he could be  personally, fully or partially, financially responsible for it. Mr. Bender expressed understanding and agreed to proceed.   Historic Elements   [name] is a 73 y.o. year old, male patient evaluated today after his last encounter by our practice on 03/02/2019. Mr. Elison  has a past medical history of Adenoma of colon, Alcoholism ([place]), Anxiety and depression, Cataract, Cervical spondylosis without myelopathy, Cervicalgia of occipito-atlanto-axial region, Chronic pain disorder, Degeneration  of cervical intervertebral disc, Degeneration, intervertebral disc, cervical, Duodenal ulcer (2011), Facet arthropathy, cervical, Fuchs' corneal dystrophy, Gastric ulcer (2011), GI bleed, Gout, Hypertension, Impaired fasting glucose, Intractable 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, LVH (left ventricular hypertrophy), Mild cognitive impairment, Mitral insufficiency, Neck pain, Neuralgia and neuritis, Nocturia, Occipital neuralgia, Osteoarthritis, Osteoarthritis, Pure hypercholesterolemia, Radiculitis, Stroke ([place]), and Tricuspid insufficiency. He also  has a past surgical history that includes Cystogram (03/08); Esophagogastroduodenoscopy (egd) with propofol (N/A, 06/20/2017); and Colonoscopy with propofol (N/A, 06/05/2018). Mr. Greek has a current medication list which includes the following prescription(s): acetaminophen, aspirin-acetaminophen-caffeine, atorvastatin, azelastine, carvedilol, cefdinir, doxazosin, ferrous sulfate, fluticasone, gabapentin, ibuprofen, pantoprazole, potassium chloride sa, prednisone, and vitamin d (ergocalciferol). He  reports that he quit smoking about 40 years ago. He has never used smokeless tobacco. He reports that he does not drink alcohol or use drugs. Mr. Ordway is allergic to ace inhibitors; aspirin; hydrochlorothiazide; lisinopril; nsaids; and ramipril.   HPI  Today, he is being contacted for a post-procedure assessment.  It was quite difficult to get information out of this patient.  He tends to complicate things when trying to explain them.  I have talked to him about keeping things simple and rather than attempting to give me his own diagnosis, I have encouraged him to simply tell me about his symptoms.  Currently he is undergoing treatment for an acute sinusitis and therefore we have put everything on hold until this goes away.  He was trying to tell me how they antibiotic was affecting his cognitive function and at one time he attempted to connect  the use of the antibiotics to his improvement in the cervical pain.  My impression is that this is not very likely and that he  in fact did get benefit from the diagnostic cervical epidural steroid injection.  However, because he is being treated for something else, he is making associations that I do not think are valid and the only thing that they are doing is complicating things for him.  Post-Procedure Evaluation  Procedure: Diagnostic left-sided cervical epidural steroid injection #1 under fluoroscopic guidance, no sedation Pre-procedure pain level:  4/10 Post-procedure: 0/10 (100% relief)  Sedation: None.  Effectiveness during initial hour after procedure(Ultra-Short Term Relief): 100 %   Local anesthetic used: Long-acting (4-6 hours) Effectiveness: Defined as any analgesic benefit obtained secondary to the administration of local anesthetics. This carries significant diagnostic value as to the etiological location, or anatomical origin, of the pain. Duration of benefit is expected to coincide with the duration of the local anesthetic used.  Effectiveness during initial 4-6 hours after procedure(Short-Term Relief): 100 %   Long-term benefit: Defined as any relief past the pharmacologic duration of the local anesthetics.  Effectiveness past the initial 6 hours after procedure(Long-Term Relief): 0 % (Patient states he has some relief but he thinks it is from the Cefdinir that was given to him by Dr Richardson Landry) (Antibiotic for sinus infection)  Current benefits: Defined as benefit that persist at this time.   Analgesia:  <50% better Function: Somewhat improved ROM: Somewhat improved  Pharmacotherapy Assessment  Analgesic:  None from our practice.    Monitoring: Pharmacotherapy: No side-effects or adverse reactions reported. [place] PMP: PDMP reviewed during this encounter.       Compliance: No problems identified. Effectiveness: Clinically acceptable. Plan: Refer to "POC".  Pertinent Labs    SAFETY SCREENING Profile Lab Results  Component Value Date   SARSCOV2NAA NOT DETECTED 02/15/2019   COVIDSOURCE NASOPHARYNGEAL 02/15/2019   MRSAPCR NEGATIVE 10/08/2011   Renal Function Lab Results  Component Value Date   BUN 23 07/09/2018   CREATININE 1.10 07/09/2018   BCR 21 07/09/2018   GFRAA 78 07/09/2018   GFRNONAA 67 07/09/2018   Hepatic Function Lab Results  Component Value Date   AST 17 07/09/2018   ALT 18 06/19/2017   ALBUMIN 4.1 07/09/2018   UDS Summary  Date Value Ref Range Status  07/09/2018 FINAL  Final    Comment:    ==================================================================== TOXASSURE COMP DRUG ANALYSIS,UR ==================================================================== Test                             Result       Flag       Units Drug Present and Declared for Prescription Verification   Gabapentin                     PRESENT      EXPECTED   Acetaminophen                  PRESENT      EXPECTED   Salicylate                     PRESENT      EXPECTED   Ibuprofen                      PRESENT      EXPECTED   Clonidine                      PRESENT      EXPECTED Drug Absent but Declared for Prescription Verification  Hydrocodone                    Not Detected UNEXPECTED ng/mg creat   Duloxetine                     Not Detected UNEXPECTED   Verapamil                      Not Detected UNEXPECTED ==================================================================== Test                      Result    Flag   Units      Ref Range   Creatinine              160              mg/dL      >=20 ==================================================================== Declared Medications:  The flagging and interpretation on this report are based on the  following declared medications.  Unexpected results may arise from  inaccuracies in the declared medications.  **Note: The testing scope of this panel includes these medications:  Clonidine  Duloxetine   Gabapentin  Hydrocodone (Hydrocodone-Acetaminophen)  Verapamil  **Note: The testing scope of this panel does not include small to  moderate amounts of these reported medications:  Acetaminophen  Acetaminophen (Excedrin)  Acetaminophen (Hydrocodone-Acetaminophen)  Aspirin (Excedrin)  Ibuprofen  **Note: The testing scope of this panel does not include following  reported medications:  Amlodipine  Amlodipine Besylate  Atorvastatin  Caffeine (Excedrin)  Carvedilol  Cyanocobalamin  Doxazosin  Hydralazine  Iron (Ferrous Sulfate)  Losartan  Pantoprazole  Polyethylene Glycol  Potassium  Vitamin D2 (Ergocalciferol) ==================================================================== For clinical consultation, please call [phone]. ====================================================================    Note: Above Lab results reviewed.  Recent imaging  DG PAIN CLINIC C-ARM 1-60 MIN NO REPORT Fluoro was used, but no Radiologist interpretation will be provided.  Please refer to "NOTES" tab for provider progress note.  Assessment  The primary encounter diagnosis was Cervical facet arthropathy (Left). Diagnoses of Thalamic pain syndrome, Cervicalgia (Left), and Cervicalgia of occipito-atlanto-axial region (Left) were also pertinent to this visit.  Plan of Care  I have discontinued Zaelyn A. Genis's hydrALAZINE. I am also having him maintain his doxazosin, carvedilol, ferrous sulfate, pantoprazole, atorvastatin, acetaminophen, Vitamin D (Ergocalciferol), potassium chloride SA, azelastine, fluticasone, gabapentin, aspirin-acetaminophen-caffeine, ibuprofen, cefdinir, and predniSONE.  Pharmacotherapy (Medications Ordered): No orders of the defined types were placed in this encounter.  Orders:  No orders of the defined types were placed in this encounter.  Follow-up plan:   Return in about 7 weeks (around 05/05/2019) for (VV), E/M (MM) to evaluate and perhaps continue with  gabapentin titration.      Interventional management options:  Considering:   Diagnostic/therapeutic left-sided CESI #2    PRN Procedures:   Diagnostic/therapeutic left-sided CESI #2     Recent Visits Date Type Provider Dept  02/18/19 Procedure visit Milinda Pointer, MD [facility]  02/08/19 Office Visit Milinda Pointer, MD [facility]  01/19/19 Office Visit Milinda Pointer, MD [facility]  12/21/18 Office Visit Milinda Pointer, MD [facility]  Showing recent visits within past 90 days and meeting all other requirements   Today's Visits Date Type Provider Dept  03/17/19 Office Visit Milinda Pointer, MD [facility]  Showing today's visits and meeting all other requirements   Future Appointments Date Type Provider Dept  04/08/19 Appointment Milinda Pointer, MD [facility]  Showing  future appointments within next 90 days and meeting all other requirements   I discussed the assessment and treatment plan with the patient. The patient was provided an opportunity to ask questions and all were answered. The patient agreed with the plan and demonstrated an understanding of the instructions.  Patient advised to call back or seek an in-person evaluation if the symptoms or condition worsens.  Total duration of non-face-to-face encounter: 15 minutes.  Note by: Gaspar Cola, MD Date: 03/17/2019; Time: 3:06 PM  Note: This dictation was prepared with Dragon dictation. Any transcriptional errors that may result from this process are unintentional.  Disclaimer:  * Given the special circumstances of the COVID-19 pandemic, the federal government has announced that the Office for Civil Rights (OCR) will exercise its enforcement discretion and will not impose penalties on physicians using telehealth in the event of noncompliance with regulatory requirements under the [place] and [place] (HIPAA) in connection with the good faith provision of telehealth during the UQJFH-54 national public health emergency. ([place])

## 2019-03-17 ENCOUNTER — Ambulatory Visit: Payer: Medicare Other | Attending: Pain Medicine | Admitting: Pain Medicine

## 2019-03-17 ENCOUNTER — Other Ambulatory Visit: Payer: Self-pay

## 2019-03-17 DIAGNOSIS — M542 Cervicalgia: Secondary | ICD-10-CM | POA: Diagnosis not present

## 2019-03-17 DIAGNOSIS — G890 Central pain syndrome: Secondary | ICD-10-CM

## 2019-03-17 DIAGNOSIS — M47812 Spondylosis without myelopathy or radiculopathy, cervical region: Secondary | ICD-10-CM | POA: Diagnosis not present

## 2019-04-02 ENCOUNTER — Inpatient Hospital Stay
Admission: EM | Admit: 2019-04-02 | Discharge: 2019-04-04 | DRG: 683 | Disposition: A | Discharge status: Home / Self Care | Payer: Medicare Other | Attending: Internal Medicine | Admitting: Internal Medicine

## 2019-04-02 ENCOUNTER — Other Ambulatory Visit: Payer: Self-pay

## 2019-04-02 ENCOUNTER — Telehealth: Payer: Self-pay | Admitting: Urology

## 2019-04-02 ENCOUNTER — Encounter: Payer: Self-pay | Admitting: Emergency Medicine

## 2019-04-02 ENCOUNTER — Inpatient Hospital Stay: Payer: Medicare Other

## 2019-04-02 DIAGNOSIS — B9689 Other specified bacterial agents as the cause of diseases classified elsewhere: Secondary | ICD-10-CM | POA: Diagnosis present

## 2019-04-02 DIAGNOSIS — Z1619 Resistance to other specified beta lactam antibiotics: Secondary | ICD-10-CM | POA: Diagnosis present

## 2019-04-02 DIAGNOSIS — Z8673 Personal history of transient ischemic attack (TIA), and cerebral infarction without residual deficits: Secondary | ICD-10-CM | POA: Diagnosis not present

## 2019-04-02 DIAGNOSIS — N390 Urinary tract infection, site not specified: Secondary | ICD-10-CM

## 2019-04-02 DIAGNOSIS — N179 Acute kidney failure, unspecified: Secondary | ICD-10-CM | POA: Diagnosis present

## 2019-04-02 DIAGNOSIS — N138 Other obstructive and reflux uropathy: Secondary | ICD-10-CM | POA: Diagnosis present

## 2019-04-02 DIAGNOSIS — Z23 Encounter for immunization: Secondary | ICD-10-CM | POA: Diagnosis present

## 2019-04-02 DIAGNOSIS — K219 Gastro-esophageal reflux disease without esophagitis: Secondary | ICD-10-CM | POA: Diagnosis present

## 2019-04-02 DIAGNOSIS — E1142 Type 2 diabetes mellitus with diabetic polyneuropathy: Secondary | ICD-10-CM | POA: Diagnosis present

## 2019-04-02 DIAGNOSIS — E785 Hyperlipidemia, unspecified: Secondary | ICD-10-CM | POA: Diagnosis present

## 2019-04-02 DIAGNOSIS — Z8619 Personal history of other infectious and parasitic diseases: Secondary | ICD-10-CM

## 2019-04-02 DIAGNOSIS — R51 Headache: Secondary | ICD-10-CM | POA: Diagnosis present

## 2019-04-02 DIAGNOSIS — Z87891 Personal history of nicotine dependence: Secondary | ICD-10-CM

## 2019-04-02 DIAGNOSIS — R319 Hematuria, unspecified: Secondary | ICD-10-CM

## 2019-04-02 DIAGNOSIS — I10 Essential (primary) hypertension: Secondary | ICD-10-CM | POA: Diagnosis present

## 2019-04-02 DIAGNOSIS — Z8249 Family history of ischemic heart disease and other diseases of the circulatory system: Secondary | ICD-10-CM

## 2019-04-02 DIAGNOSIS — N401 Enlarged prostate with lower urinary tract symptoms: Secondary | ICD-10-CM | POA: Diagnosis present

## 2019-04-02 LAB — CBC
HCT: 33 % — ABNORMAL LOW (ref 39.0–52.0)
Hemoglobin: 11 g/dL — ABNORMAL LOW (ref 13.0–17.0)
MCH: 29 pg (ref 26.0–34.0)
MCHC: 33.3 g/dL (ref 30.0–36.0)
MCV: 87.1 fL (ref 80.0–100.0)
Platelets: 144 10*3/uL — ABNORMAL LOW (ref 150–400)
RBC: 3.79 MIL/uL — ABNORMAL LOW (ref 4.22–5.81)
RDW: 15.7 % — ABNORMAL HIGH (ref 11.5–15.5)
WBC: 9.4 10*3/uL (ref 4.0–10.5)
nRBC: 0 % (ref 0.0–0.2)

## 2019-04-02 LAB — COMPREHENSIVE METABOLIC PANEL
ALT: 22 U/L (ref 0–44)
AST: 21 U/L (ref 15–41)
Albumin: 2.9 g/dL — ABNORMAL LOW (ref 3.5–5.0)
Alkaline Phosphatase: 55 U/L (ref 38–126)
Anion gap: 8 (ref 5–15)
BUN: 61 mg/dL — ABNORMAL HIGH (ref 8–23)
CO2: 22 mmol/L (ref 22–32)
Calcium: 8.8 mg/dL — ABNORMAL LOW (ref 8.9–10.3)
Chloride: 110 mmol/L (ref 98–111)
Creatinine, Ser: 3.54 mg/dL — ABNORMAL HIGH (ref 0.61–1.24)
GFR calc Af Amer: 19 mL/min — ABNORMAL LOW (ref >60)
GFR calc non Af Amer: 16 mL/min — ABNORMAL LOW (ref >60)
Glucose, Bld: 126 mg/dL — ABNORMAL HIGH (ref 70–99)
Potassium: 4.1 mmol/L (ref 3.5–5.1)
Sodium: 140 mmol/L (ref 135–145)
Total Bilirubin: 0.7 mg/dL (ref 0.3–1.2)
Total Protein: 6.3 g/dL — ABNORMAL LOW (ref 6.5–8.1)

## 2019-04-02 LAB — GLUCOSE, CAPILLARY
Glucose-Capillary: 109 mg/dL — ABNORMAL HIGH (ref 70–99)
Glucose-Capillary: 119 mg/dL — ABNORMAL HIGH (ref 70–99)

## 2019-04-02 LAB — URINALYSIS, COMPLETE (UACMP) WITH MICROSCOPIC
Bilirubin Urine: NEGATIVE
Glucose, UA: NEGATIVE mg/dL
Ketones, ur: NEGATIVE mg/dL
Nitrite: NEGATIVE
Protein, ur: 100 mg/dL — AB
Specific Gravity, Urine: 1.014 (ref 1.005–1.030)
WBC, UA: >50 WBC/hpf — ABNORMAL HIGH (ref 0–5)
pH: 5 (ref 5.0–8.0)

## 2019-04-02 LAB — SARS CORONAVIRUS 2 BY RT PCR (HOSPITAL ORDER, PERFORMED IN ~~LOC~~ HOSPITAL LAB): SARS Coronavirus 2: NEGATIVE

## 2019-04-02 MED ORDER — SODIUM CHLORIDE 0.9 % IV SOLN
INTRAVENOUS | Status: DC
  Administered 2019-04-02 – 2019-04-04 (×3): via INTRAVENOUS

## 2019-04-02 MED ORDER — MORPHINE SULFATE (PF) 2 MG/ML IV SOLN
1.0000 mg | Freq: Once | INTRAVENOUS | Status: AC
  Administered 2019-04-02: 1 mg via INTRAVENOUS
  Filled 2019-04-02: qty 1

## 2019-04-02 MED ORDER — ACETAMINOPHEN 650 MG RE SUPP: 650.0000 mg | Freq: Four times a day (QID) | RECTAL | Status: DC | PRN

## 2019-04-02 MED ORDER — SODIUM CHLORIDE 0.9 % IV BOLUS
1000.0000 mL | Freq: Once | INTRAVENOUS | Status: AC
  Administered 2019-04-02: 1000 mL via INTRAVENOUS

## 2019-04-02 MED ORDER — CIPROFLOXACIN IN D5W 400 MG/200ML IV SOLN
400.0000 mg | Freq: Once | INTRAVENOUS | Status: AC
  Administered 2019-04-02: 400 mg via INTRAVENOUS
  Filled 2019-04-02: qty 200

## 2019-04-02 MED ORDER — INSULIN ASPART 100 UNIT/ML ~~LOC~~ SOLN: 0.0000 [IU] | Freq: Every day | SUBCUTANEOUS | Status: DC

## 2019-04-02 MED ORDER — SODIUM CHLORIDE 0.9 % IV SOLN
1.0000 g | INTRAVENOUS | Status: DC
  Administered 2019-04-02 – 2019-04-03 (×2): 1 g via INTRAVENOUS
  Filled 2019-04-02 (×3): qty 10

## 2019-04-02 MED ORDER — INSULIN ASPART 100 UNIT/ML ~~LOC~~ SOLN: 0.0000 [IU] | Freq: Three times a day (TID) | SUBCUTANEOUS | Status: DC

## 2019-04-02 MED ORDER — ACETAMINOPHEN 325 MG PO TABS
650.0000 mg | ORAL_TABLET | Freq: Four times a day (QID) | ORAL | Status: DC | PRN
  Administered 2019-04-02 – 2019-04-04 (×7): 650 mg via ORAL
  Filled 2019-04-02 (×7): qty 2

## 2019-04-02 MED ORDER — SODIUM CHLORIDE 0.9 % IV BOLUS: 1000.0000 mL | Freq: Once | INTRAVENOUS | Status: DC

## 2019-04-02 MED ORDER — ONDANSETRON HCL 4 MG/2ML IJ SOLN: 4.0000 mg | Freq: Four times a day (QID) | INTRAMUSCULAR | Status: DC | PRN

## 2019-04-02 MED ORDER — TAMSULOSIN HCL 0.4 MG PO CAPS
0.4000 mg | ORAL_CAPSULE | Freq: Every day | ORAL | Status: DC
  Administered 2019-04-02 – 2019-04-04 (×3): 0.4 mg via ORAL
  Filled 2019-04-02 (×3): qty 1

## 2019-04-02 MED ORDER — LIDOCAINE HCL (CARDIAC) PF 100 MG/5ML IV SOSY
PREFILLED_SYRINGE | INTRAVENOUS | Status: AC
  Filled 2019-04-02: qty 5

## 2019-04-02 MED ORDER — HEPARIN SODIUM (PORCINE) 5000 UNIT/ML IJ SOLN
5000.0000 [IU] | Freq: Three times a day (TID) | INTRAMUSCULAR | Status: DC
  Administered 2019-04-02 – 2019-04-04 (×5): 5000 [IU] via SUBCUTANEOUS
  Filled 2019-04-02 (×5): qty 1

## 2019-04-02 MED ORDER — ONDANSETRON HCL 4 MG PO TABS: 4.0000 mg | ORAL_TABLET | Freq: Four times a day (QID) | ORAL | Status: DC | PRN

## 2019-04-02 NOTE — ED Notes (Signed)
Bladder scan results: 828 ml.

## 2019-04-02 NOTE — Telephone Encounter (Signed)
App made ok to put on nurse per [facility] since he did a consult on the patient   Sharyn Lull

## 2019-04-02 NOTE — ED Notes (Addendum)
ED TO INPATIENT HANDOFF REPORT  ED Nurse Name and Phone #: Karena Addison [phone]  S Name/Age/Gender Carl [name] 73 y.o. male Room/Bed: ED25A/ED25A  Code Status   Code Status: Prior  Home/SNF/Other Home Patient oriented to: self, place, time and situation Is this baseline? Yes   Triage Complete: Triage complete  Chief Complaint difficulty urinating  Triage Note Pt to ED via POV via POV c/o dysuria and stool incontinence x 1 week. Pt denies abdominal pain, fevers or chills. Pt is in NAD at this time.     Allergies Allergies  Allergen Reactions  . Ace Inhibitors Other (See Comments)    unknown  . Aspirin     Other reaction(s): Other (See Comments) He gets GI bleed.    . Hydrochlorothiazide Other (See Comments)    unknown  . Lisinopril Other (See Comments)    unknown  . Nsaids Other (See Comments)    Gi bleed  . Ramipril Other (See Comments)    unknown    Level of Care/Admitting Diagnosis ED Disposition    ED Disposition Condition Comment   Admit  The patient appears reasonably stabilized for admission considering the current resources, flow, and capabilities available in the ED at this time, and I doubt any other [facility] requiring further screening and/or treatment in the ED prior to admission is  present.       B Medical/Surgery History Past Medical History:  Diagnosis Date  . Adenoma of colon   . Alcoholism ([place])    still drinking wine as of Feb 2013  . Anxiety and depression   . Cataract   . Cervical spondylosis without myelopathy   . Cervicalgia of occipito-atlanto-axial region   . Chronic pain disorder   . Degeneration of cervical intervertebral disc   . Degeneration, intervertebral disc, cervical   . Duodenal ulcer 2011  . Facet arthropathy, cervical   . Fuchs' corneal dystrophy   . Gastric ulcer 2011   egd at [place] Aug 2011.  . GI bleed   . Gout   . Hypertension   . Impaired fasting glucose   . Intractable chronic migraine without aura  and without status migrainosus   . LVH (left ventricular hypertrophy)   . Mild cognitive impairment   . Mitral insufficiency   . Neck pain   . Neuralgia and neuritis   . Nocturia   . Occipital neuralgia   . Osteoarthritis    neck  . Osteoarthritis   . Pure hypercholesterolemia   . Radiculitis   . Stroke ([place])   . Tricuspid insufficiency    Past Surgical History:  Procedure Laterality Date  . COLONOSCOPY WITH PROPOFOL N/A 06/05/2018   Procedure: COLONOSCOPY WITH PROPOFOL;  Surgeon: Manya Silvas, MD;  [facility] END[facility]COPY;  Service: Endoscopy;  Laterality: N/A;  . CYSTOGRAM  03/08  . ESOPHAGOGASTRODUODENOSCOPY (EGD) WITH PROPOFOL N/A 06/20/2017   Procedure: ESOPHAGOGASTRODUODENOSCOPY (EGD) WITH PROPOFOL;  Surgeon: Lucilla Lame, MD;  Location: [facility] ENDOSCOPY;  Service: Endoscopy;  Laterality: N/A;     A IV Location/Drains/Wounds Patient Lines/Drains/Airways Status   Active Line/Drains/Airways    Name:   Placement date:   Placement time:   Site:   Days:   Peripheral IV 04/02/19 Left Antecubital   04/02/19    1238    Antecubital   less than 1          Intake/Output Last 24 hours No intake or output data in the 24 hours ending 04/02/19 1315  Labs/Imaging Results for orders  placed or performed during the hospital encounter of 04/02/19 (from the past 48 hour(s))  Comprehensive metabolic panel     Status: Abnormal   Collection Time: 04/02/19 10:44 AM  Result Value Ref Range   Sodium 140 135 - 145 mmol/L   Potassium 4.1 3.5 - 5.1 mmol/L   Chloride 110 98 - 111 mmol/L   CO2 22 22 - 32 mmol/L   Glucose, Bld 126 (H) 70 - 99 mg/dL   BUN 61 (H) 8 - 23 mg/dL   Creatinine, Ser 3.54 (H) 0.61 - 1.24 mg/dL   Calcium 8.8 (L) 8.9 - 10.3 mg/dL   Total Protein 6.3 (L) 6.5 - 8.1 g/dL   Albumin 2.9 (L) 3.5 - 5.0 g/dL   AST 21 15 - 41 U/L   ALT 22 0 - 44 U/L   Alkaline Phosphatase 55 38 - 126 U/L   Total Bilirubin 0.7 0.3 - 1.2 mg/dL   GFR calc non Af Amer 16 (L) >60 mL/min    GFR calc Af Amer 19 (L) >60 mL/min   Anion gap 8 5 - 15    Comment: Performed at [facility], [address] [place], [place] [address]  CBC     Status: Abnormal   Collection Time: 04/02/19 10:44 AM  Result Value Ref Range   WBC 9.4 4.0 - 10.5 K/uL   RBC 3.79 (L) 4.22 - 5.81 MIL/uL   Hemoglobin 11.0 (L) 13.0 - 17.0 g/dL   HCT 33.0 (L) 39.0 - 52.0 %   MCV 87.1 80.0 - 100.0 fL   MCH 29.0 26.0 - 34.0 pg   MCHC 33.3 30.0 - 36.0 g/dL   RDW 15.7 (H) 11.5 - 15.5 %   Platelets 144 (L) 150 - 400 K/uL   nRBC 0.0 0.0 - 0.2 %    Comment: Performed at [facility], [address] [place], [place] [address]  Urinalysis, Complete w Microscopic     Status: Abnormal   Collection Time: 04/02/19 10:44 AM  Result Value Ref Range   Color, Urine YELLOW (A) YELLOW   APPearance TURBID (A) CLEAR   Specific Gravity, Urine 1.014 1.005 - 1.030   pH 5.0 5.0 - 8.0   Glucose, UA NEGATIVE NEGATIVE mg/dL   Hgb urine dipstick SMALL (A) NEGATIVE   Bilirubin Urine NEGATIVE NEGATIVE   Ketones, ur NEGATIVE NEGATIVE mg/dL   Protein, ur 100 (A) NEGATIVE mg/dL   Nitrite NEGATIVE NEGATIVE   Leukocytes,Ua LARGE (A) NEGATIVE   RBC / HPF 21-50 0 - 5 RBC/hpf   WBC, UA >50 (H) 0 - 5 WBC/hpf   Bacteria, UA RARE (A) NONE SEEN   Squamous Epithelial / LPF 0-5 0 - 5   WBC Clumps PRESENT     Comment: Performed at [facility], [address] [place], [place] [address]   No results found.  Pending Labs [employer] (From admission, onward)    Start     Ordered   04/02/19 1303  SARS Coronavirus 2 [facility] order, Performed in [facility] hospital lab) Nasopharyngeal Nasopharyngeal Swab  (Symptomatic/High Risk of Exposure Patients Labs with Precautions)  ONCE - STAT,   STAT    Question Answer Comment  Is this test for diagnosis or screening Diagnosis of ill patient   Symptomatic for COVID-19 as defined by CDC Yes   Date of Symptom Onset 03/25/2019   Hospitalized for COVID-19 Yes    Admitted to ICU for COVID-19 No   Previously tested for COVID-19 Yes  Resident in a congregate (group) care setting No   Employed in healthcare setting No      04/02/19 1302   04/02/19 1230  Urine culture  Add-on,   AD     04/02/19 1229          Vitals/Pain Today's Vitals   04/02/19 1039 04/02/19 1040 04/02/19 1241 04/02/19 1300  BP: 91/60  130/83 107/70  Pulse: 75  74 66  Resp: 16     Temp: 98.8 F (37.1 C)     TempSrc: Oral     SpO2: 97%  96% 96%  Height:  5\' 7"  (1.702 m)    PainSc:  0-No pain      Isolation Precautions Airborne and Contact precautions  Medications Medications  ciprofloxacin (CIPRO) IVPB 400 mg (400 mg Intravenous New Bag/Given 04/02/19 1239)  sodium chloride 0.9 % bolus 1,000 mL (1,000 mLs Intravenous New Bag/Given 04/02/19 1239)    Mobility walks with device High fall risk   Focused Assessments    R Recommendations: See Admitting Provider Note  Report given to: Cloyde Reams, RN

## 2019-04-02 NOTE — Consult Note (Signed)
04/02/19 3:02 PM   Carl [name] [birth year] [id number]  CC: UTI and urinary retention  HPI: I was asked to see Carl [name] in the emergency department in consultation from Dr. Rip Harbour for urinary retention.  He is a comorbid 73 year old male who presents with inability to urinate and weakening urinary stream over the last few months.  He also has had some dysuria, frequency, and decreased urine flow.  He denies any urologic history.  ED nurse attempted Foley at bedside, and met resistance 2 cm into the meatus.  He is extremely uncomfortable and reports 10/10 lower abdominal pain and pressure with urge to void.  Bladder scan was 820 cc in the ED.  He denies any history of gross hematuria, UTIs, or flank pain.  There are no aggravating or alleviating factors.  Severity is severe.  Labs notable for AKI with creatinine of 3.54 from baseline 1.1, and urinalysis concerning for infection with rare bacteria, greater than 50 WBCs, WBC clumps, 21-50 RBCs, nitrite negative, large leukocytes.   PMH: Past Medical History:  Diagnosis Date  . Adenoma of colon   . Alcoholism ([place])    still drinking wine as of Feb 2013  . Anxiety and depression   . Cataract   . Cervical spondylosis without myelopathy   . Cervicalgia of occipito-atlanto-axial region   . Chronic pain disorder   . Degeneration of cervical intervertebral disc   . Degeneration, intervertebral disc, cervical   . Duodenal ulcer 2011  . Facet arthropathy, cervical   . Fuchs' corneal dystrophy   . Gastric ulcer 2011   egd at [place] Aug 2011.  . GI bleed   . Gout   . Hypertension   . Impaired fasting glucose   . 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 LVH (left ventricular hypertrophy)   . Mild cognitive impairment   . Mitral insufficiency   . Neck pain   . Neuralgia and neuritis   . Nocturia   . Occipital neuralgia   . Osteoarthritis    neck  . Osteoarthritis   . Pure  hypercholesterolemia   . Radiculitis   . Stroke ([place])   . Tricuspid insufficiency     Surgical History: Past Surgical History:  Procedure Laterality Date  . COLONOSCOPY WITH PROPOFOL N/A 06/05/2018   Procedure: COLONOSCOPY WITH PROPOFOL;  Surgeon: Manya Silvas, MD;  Location: [facility] ENDOSCOPY;  Service: Endoscopy;  Laterality: N/A;  . CYSTOGRAM  03/08  . ESOPHAGOGASTRODUODENOSCOPY (EGD) WITH PROPOFOL N/A 06/20/2017   Procedure: ESOPHAGOGASTRODUODENOSCOPY (EGD) WITH PROPOFOL;  Surgeon: Lucilla Lame, MD;  Location: [facility] ENDOSCOPY;  Service: Endoscopy;  Laterality: N/A;   Allergies:  Allergies  Allergen Reactions  . Ace Inhibitors Other (See Comments)    unknown  . Aspirin     Other reaction(s): Other (See Comments) He gets GI bleed.    . Hydrochlorothiazide Other (See Comments)    unknown  . Lisinopril Other (See Comments)    unknown  . Nsaids Other (See Comments)    Gi bleed  . Ramipril Other (See Comments)    unknown    Family History: Family History  Problem Relation Age of Onset  . Fibromyalgia Mother   . Hypertension Father   . Diabetes Neg Hx     Social History:  reports that he quit smoking about 40 years ago. His smoking use included cigarettes. He has a 30.00 pack-year smoking history. He has never used smokeless tobacco. He reports that he  does not drink alcohol or use drugs.  ROS: Please see flowsheet from today's date for complete review of systems.  Physical Exam: BP (!) 117/55   Pulse 76   Temp 98.8 F (37.1 C) (Oral)   Resp 16   Ht '5\' 7"'$  (1.702 m)   SpO2 97%   BMI 29.76 kg/m    Constitutional: Visibly uncomfortable, writhing in bed Cardiovascular: No clubbing, cyanosis, or edema. Respiratory: Normal respiratory effort, no increased work of breathing. GI: Lower abdomen is tense, distended, and tender GU: Circumcised phallus with patent meatus Lymph: No cervical or inguinal lymphadenopathy. Skin: No rashes, bruises or suspicious lesions.  Neurologic: Grossly intact, no focal deficits, moving all 4 extremities. Psychiatric: Normal mood and affect.  Laboratory Data: Reviewed, see HPI  Pertinent Imaging: None to review Renal ultrasound pending  Procedure: Patient was prepped and draped in standard sterile fashion.  Urojet was injected into the meatus.  A 16 French Coloplast catheter was gently advanced into the urethra and met resistance approximately 2 cm in.  With moderate difficulty this was able to be navigated through a likely stricture and into the bladder.  There was return of clear yellow urine.  10 cc was placed in the balloon.  800 cc of cloudy yellow urine drained from the bladder.  There was no hematuria.  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  He also has an AKI likely from post renal obstruction, as well as a urinary tract infection.  We reviewed the possible etiologies of urethral strictures at length, the need for close follow-up, and possible need for additional dilations/DVIU/or more extensive reconstructive procedures in the future.  Maintain Foley, we will schedule follow-up in urology clinic in ~1 week for Foley removal and voiding trial Agree with broad-spectrum antibiotics, narrow as able pending cultures Monitor for post-obstructive diuresis Call if questions   [name], [place] [address], [place], [place] [address] [phone]

## 2019-04-02 NOTE — ED Triage Notes (Signed)
Pt to ED via POV via POV c/o dysuria and stool incontinence x 1 week. Pt denies abdominal pain, fevers or chills. Pt is in NAD at this time.

## 2019-04-02 NOTE — ED Provider Notes (Signed)
[facility] Emergency Department Provider Note   ____________________________________________   First MD Initiated Contact with Patient 04/02/19 1229     (approximate)  I have reviewed the triage vital signs and the nursing notes.   HISTORY  Chief Complaint Dysuria   HPI Carl [name] is a 73 y.o. male who complains of several days of burning and dysuria.  It is quite bad at times.  Patient has been taking a good bit of Motrin he says.  Additionally recently he had COVID.  Patient urinates and has bladder scan residuals 800.  Labs show AKI.  Patient will have to come in the hospital.  He does states been taking a lot of Motrin lately.     Past Medical History:  Diagnosis Date  . Adenoma of colon   . Alcoholism ([place])    still drinking wine as of Feb 2013  . Anxiety and depression   . Cataract   . Cervical spondylosis without myelopathy   . Cervicalgia of occipito-atlanto-axial region   . Chronic pain disorder   . Degeneration of cervical intervertebral disc   . Degeneration, intervertebral disc, cervical   . Duodenal ulcer 2011  . Facet arthropathy, cervical   . Fuchs' corneal dystrophy   . Gastric ulcer 2011   egd at [place] Aug 2011.  . GI bleed   . Gout   . Hypertension   . Impaired fasting glucose   . 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 LVH (left ventricular hypertrophy)   . Mild cognitive impairment   . Mitral insufficiency   . Neck pain   . Neuralgia and neuritis   . Nocturia   . Occipital neuralgia   . Osteoarthritis    neck  . Osteoarthritis   . Pure hypercholesterolemia   . Radiculitis   . Stroke ([place])   . Tricuspid insufficiency     Patient Active Problem List   Diagnosis Date Noted  . Acute renal failure (ARF) ([place]) 04/02/2019  . Neurogenic pain 12/21/2018  . Cervical spondylosis with radiculopathy 10/13/2018  . Melena 09/15/2018  . Acute esophagogastric ulcer  09/15/2018  . Duodenum ulcer 09/15/2018  . 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 Chronic neck pain (Primary Area of Pain) (Left) 07/09/2018  . Chronic pain syndrome 07/09/2018  . Pharmacologic therapy 07/09/2018  . Disorder of skeletal system 07/09/2018  . Problems influencing health status 07/09/2018  . Chronic facial pain (Secondary Area of Pain) (Left) 07/09/2018  . Cervicalgia (Left) 07/09/2018  . Abnormal behavior 04/01/2018  . Socially inappropriate behavior 04/01/2018  . History of adenomatous polyp of colon 03/20/2018  . History of upper gastrointestinal bleeding 03/20/2018  . GIB (gastrointestinal bleeding) 06/19/2017  . Occipital neuralgia (Left) 03/04/2017  . GI bleed 03/03/2017  . History of stroke 03/03/2017  . Right sided weakness 03/03/2017  . Slurred speech 03/03/2017  . Word finding difficulty 03/03/2017  . Opiate use 03/03/2017  . Chronic headaches 03/03/2017  . Chronic knee pain (Bilateral) 03/03/2017  . 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 Cervical spondylosis without myelopathy 05/16/2015  . Cervicalgia of occipito-atlanto-axial region (Left) 02/09/2015  . 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 Chronic pain disorder 09/09/2013  . Neuralgia, neuritis, and radiculitis, unspecified 09/09/2013  . Cognitive disorder 06/23/2013  . Depression, major, single episode, moderate ([place]) 06/23/2013  . Mild cognitive impairment 06/23/2013  .  Anxiety and depression 05/05/2013  . Cervical facet arthropathy (Left) 05/05/2013  . Spondylosis without myelopathy or radiculopathy, cervical region 05/05/2013  . Nocturia 04/22/2013  . LVH (left ventricular hypertrophy) 02/19/2013  . Mitral insufficiency 02/19/2013  . Pure hypercholesterolemia 02/19/2013  . Tricuspid insufficiency 02/19/2013  . Cataracts, bilateral 08/21/2012  . Fuchs' corneal dystrophy  08/21/2012  . Alcohol dependence in remission ([place]) 03/14/2011  . DDD (degenerative disc disease), cervical 03/14/2011  . Persistent insomnia 03/14/2011  . Osteoarthritis involving multiple joints 03/14/2011  . Primary generalized (osteo)arthritis 03/14/2011  . ANXIETY, SITUATIONAL 06/15/2010  . Situational anxiety 06/15/2010  . CELLULITIS, HAND, LEFT 05/03/2010  . Cellulitis of unspecified part of limb 05/03/2010  . Unspecified contact dermatitis due to plants, except food 01/12/2010  . Sprain of joints and ligaments of unspecified parts of neck, initial encounter 01/03/2010  . LUNG NODULE 07/17/2009  . Chest pain 07/13/2009  . UNSPECIFIED PROSTATITIS 06/15/2009  . Alcohol abuse 05/15/2009  . Alcohol withdrawal ([place]) 04/24/2009  . Impaired fasting glucose 11/04/2007  . Gout 01/02/2007  . Essential hypertension 01/02/2007    Past Surgical History:  Procedure Laterality Date  . COLONOSCOPY WITH PROPOFOL N/A 06/05/2018   Procedure: COLONOSCOPY WITH PROPOFOL;  Surgeon: Manya Silvas, MD;  Location: [facility] ENDOSCOPY;  Service: Endoscopy;  Laterality: N/A;  . CYSTOGRAM  03/08  . ESOPHAGOGASTRODUODENOSCOPY (EGD) WITH PROPOFOL N/A 06/20/2017   Procedure: ESOPHAGOGASTRODUODENOSCOPY (EGD) WITH PROPOFOL;  Surgeon: Lucilla Lame, MD;  Location: [facility] ENDOSCOPY;  Service: Endoscopy;  Laterality: N/A;    Prior to Admission medications   Medication Sig Start Date End Date Taking? Authorizing Provider  acetaminophen (TYLENOL) 500 MG tablet Take 500-1,000 mg by mouth every 6 (six) hours as needed for mild pain, moderate pain or fever.    Yes [provider]  amLODipine (NORVASC) 10 MG tablet Take 10 mg by mouth daily.   Yes [provider]  atorvastatin (LIPITOR) 10 MG tablet Take 10 mg by mouth every evening.    Yes [provider]  carvedilol (COREG) 12.5 MG tablet Take 12.5 mg by mouth 2 (two) times daily.  11/06/16  Yes [provider]  doxazosin (CARDURA) 8  MG tablet Take 4 mg by mouth daily.    Yes [provider]  ferrous sulfate 325 (65 FE) MG tablet Take 1 tablet (325 mg total) by mouth 2 (two) times daily with a meal. 06/22/17  Yes Demetrios Loll, MD  gabapentin (NEURONTIN) 800 MG tablet Take 1 tablet (800 mg total) by mouth at bedtime. Must last 30 days 02/08/19 05/09/19 Yes Milinda Pointer, MD  ipratropium (ATROVENT) 0.06 % nasal spray Place 2 sprays into both nostrils 3 (three) times daily. 03/03/19  Yes [provider]  losartan (COZAAR) 100 MG tablet Take 100 mg by mouth every evening.   Yes [provider]  potassium chloride SA (K-DUR,KLOR-CON) 20 MEQ tablet Take 20 mEq by mouth daily.    Yes [provider]  pantoprazole (PROTONIX) 40 MG tablet Take 1 tablet (40 mg total) by mouth 2 (two) times daily before a meal. Patient not taking: Reported on 04/02/2019 06/22/17   Demetrios Loll, MD    Allergies Ace inhibitors, Aspirin, Hydrochlorothiazide, Lisinopril, Nsaids, and Ramipril  Family History  Problem Relation Age of Onset  . Fibromyalgia Mother   . Hypertension Father   . Diabetes Neg Hx     Social History Social History   Tobacco Use  . Smoking status: Former Smoker    Packs/day: 1.00  Years: 30.00    Pack years: 30.00    Types: Cigarettes    Quit date: 09/02/1978    Years since quitting: 40.6  . Smokeless tobacco: Never Used  Substance Use Topics  . Alcohol use: No  . Drug use: No    Review of Systems  Constitutional: No fever/chills Eyes: No visual changes. ENT: No sore throat. Cardiovascular: Denies chest pain. Respiratory: Denies shortness of breath. Gastrointestinal: No abdominal pain.  No nausea, no vomiting.  No diarrhea.  No constipation. Genitourinary: dysuria. Musculoskeletal: Negative for back pain. Skin: Negative for rash. Neurological: Negative for headaches, focal weakness   ____________________________________________   PHYSICAL EXAM:  VITAL SIGNS: ED Triage  Vitals  Enc Vitals Group     BP 04/02/19 1039 91/60     Pulse Rate 04/02/19 1039 75     Resp 04/02/19 1039 16     Temp 04/02/19 1039 98.8 F (37.1 C)     Temp Source 04/02/19 1039 Oral     SpO2 04/02/19 1039 97 %     Weight --      Height 04/02/19 1040 5\' 7"  (1.702 m)     Head Circumference --      Peak Flow --      Pain Score 04/02/19 1040 0     Pain Loc --      Pain Edu? --      Excl. in [place]? --     Constitutional: Alert and oriented. Well appearing but looks uncomfortable Eyes: Conjunctivae are normal.  Head: Atraumatic. Nose: No congestion/rhinnorhea. Mouth/Throat: Mucous membranes are moist.  Oropharynx non-erythematous. Neck: No stridor. Cardiovascular: Normal rate, regular rhythm. Grossly normal heart sounds.  Good peripheral circulation. Respiratory: Normal respiratory effort.  No retractions. Lungs CTAB. Gastrointestinal: Soft and nontender. No distention. No abdominal bruits. No CVA tenderness. Musculoskeletal: No lower extremity tenderness nor edema.  Neurologic:  Normal speech and language. No gross focal neurologic deficits are appreciated. No gait instability. Skin:  Skin is warm, dry and intact. No rash noted.   ____________________________________________   LABS (all labs ordered are listed, but only abnormal results are displayed)  Labs Reviewed  COMPREHENSIVE METABOLIC PANEL - Abnormal; Notable for the following components:      Result Value   Glucose, Bld 126 (*)    BUN 61 (*)    Creatinine, Ser 3.54 (*)    Calcium 8.8 (*)    Total Protein 6.3 (*)    Albumin 2.9 (*)    GFR calc non Af Amer 16 (*)    GFR calc Af Amer 19 (*)    All other components within normal limits  CBC - Abnormal; Notable for the following components:   RBC 3.79 (*)    Hemoglobin 11.0 (*)    HCT 33.0 (*)    RDW 15.7 (*)    Platelets 144 (*)    All other components within normal limits  URINALYSIS, COMPLETE (UACMP) WITH MICROSCOPIC - Abnormal; Notable for the following  components:   Color, Urine YELLOW (*)    APPearance TURBID (*)    Hgb urine dipstick SMALL (*)    Protein, ur 100 (*)    Leukocytes,Ua LARGE (*)    WBC, UA >50 (*)    Bacteria, UA RARE (*)    All other components within normal limits  SARS CORONAVIRUS 2 (HOSPITAL ORDER, [place] [facility])  URINE CULTURE   ____________________________________________  EKG   ____________________________________________  RADIOLOGY  ED MD interpretation:   Official  radiology report(s): No results found.  ____________________________________________   PROCEDURES  Procedure(s) performed (including Critical Care):criti9cal time 15 minutes. This includes reviewing records, speaking to the patient, nurse, hospitalist and later the urol;ogist.  Procedures   ____________________________________________   INITIAL IMPRESSION / ASSESSMENT AND PLAN / ED COURSE  Nurses unable to pass Foley more than about 2 inches.  Patient having urinary retention.  This looks like a stricture I will call the urologist.             ____________________________________________   FINAL CLINICAL IMPRESSION(S) / ED DIAGNOSES  Final diagnoses:  AKI (acute kidney injury) ([place])  Urinary tract infection with hematuria, site unspecified     ED Discharge Orders    None       Note:  This document was prepared using Dragon voice recognition software and may include unintentional dictation errors.    Nena Polio, MD 04/02/19 [phone]

## 2019-04-02 NOTE — ED Notes (Signed)
Urologist at bedside.

## 2019-04-02 NOTE — H&P (Signed)
[facility] [place] NAME: Carl [name]    MR#:  [id number]  DATE OF BIRTH:  Jan 08, 1946  DATE OF ADMISSION:  04/02/2019  PRIMARY CARE PHYSICIAN: Hortencia Pilar, MD   REQUESTING/REFERRING PHYSICIAN: Dr. Conni Slipper  CHIEF COMPLAINT:   Chief Complaint  Patient presents with  . Dysuria    HISTORY OF PRESENT ILLNESS:  Carl [name]  is a 73 y.o. male with a known history of hypertension, history of gout, history of duodenal ulcer, anxiety/depression, history of alcohol abuse, osteoarthritis, diabetes, hyperlipidemia, history of previous CVA who presents to the hospital due to lower abdominal pain, difficulty urinating.  Patient says he has been having some dysuria, difficulty urinating ongoing for the past few days progressively getting worse and therefore came to the ER for further evaluation.  In the ER patient underwent routine blood work which showed a urinalysis consistent with a UTI and patient was also noted to be in acute kidney injury.  Hospitalist services were contacted for admission.  Upon evaluation of the patient he was noted to have difficulty getting a urinary stream and difficulty urinating and underwent a bladder scan which showed greater than 800 cc of urine.  A Foley catheter is to be placed by the ER nurse and patient will be admitted to the hospital.  PAST MEDICAL HISTORY:   Past Medical History:  Diagnosis Date  . Adenoma of colon   . Alcoholism ([place])    still drinking wine as of Feb 2013  . Anxiety and depression   . Cataract   . Cervical spondylosis without myelopathy   . Cervicalgia of occipito-atlanto-axial region   . Chronic pain disorder   . Degeneration of cervical intervertebral disc   . Degeneration, intervertebral disc, cervical   . Duodenal ulcer 2011  . Facet arthropathy, cervical   . Fuchs' corneal dystrophy   . Gastric ulcer 2011   egd at [facility] Aug 2011.  . GI bleed   . Gout   .  Hypertension   . Impaired fasting glucose   . 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 LVH (left ventricular hypertrophy)   . Mild cognitive impairment   . Mitral insufficiency   . Neck pain   . Neuralgia and neuritis   . Nocturia   . Occipital neuralgia   . Osteoarthritis    neck  . Osteoarthritis   . Pure hypercholesterolemia   . Radiculitis   . Stroke ([place])   . Tricuspid insufficiency     PAST SURGICAL HISTORY:   Past Surgical History:  Procedure Laterality Date  . COLONOSCOPY WITH PROPOFOL N/A 06/05/2018   Procedure: COLONOSCOPY WITH PROPOFOL;  Surgeon: Manya Silvas, MD;  Location[facility] ENDOSCOPY;  Service: Endoscopy;  Laterality: N/A;  . CYSTOGRAM  03/08  . ESOPHAGOGASTRODUODENOSCOPY (EGD) WITH PROPOFOL N/A 06/20/2017   Procedure: ESOPHAGOGASTRODUODENOSCOPY (EGD) WITH PROPOFOL;  Surgeon: Lucilla Lame, MD;  Location: [facility] ENDOSCOPY;  Service: Endoscopy;  Laterality: N/A;    SOCIAL HISTORY:   Social History   Tobacco Use  . Smoking status: Former Smoker    Packs/day: 1.00    Years: 30.00    Pack years: 30.00    Types: Cigarettes    Quit date: 09/02/1978    Years since quitting: 40.6  . Smokeless tobacco: Never Used  Substance Use Topics  . Alcohol use: No    FAMILY HISTORY:   Family History  Problem Relation Age of Onset  .  Fibromyalgia Mother   . Hypertension Father   . Diabetes Neg Hx     DRUG ALLERGIES:   Allergies  Allergen Reactions  . Ace Inhibitors Other (See Comments)    unknown  . Aspirin     Other reaction(s): Other (See Comments) He gets GI bleed.    . Hydrochlorothiazide Other (See Comments)    unknown  . Lisinopril Other (See Comments)    unknown  . Nsaids Other (See Comments)    Gi bleed  . Ramipril Other (See Comments)    unknown    REVIEW OF SYSTEMS:   Review of Systems  Constitutional: Negative for fever and weight loss.  HENT: Negative for congestion, nosebleeds and tinnitus.    Eyes: Negative for blurred vision, double vision and redness.  Respiratory: Negative for cough, hemoptysis and shortness of breath.   Cardiovascular: Negative for chest pain, orthopnea, leg swelling and PND.  Gastrointestinal: Negative for abdominal pain, diarrhea, melena, nausea and vomiting.  Genitourinary: Positive for dysuria, frequency and urgency. Negative for hematuria.  Musculoskeletal: Negative for falls and joint pain.  Neurological: Negative for dizziness, tingling, sensory change, focal weakness, seizures, weakness and headaches.  Endo/Heme/Allergies: Negative for polydipsia. Does not bruise/bleed easily.  Psychiatric/Behavioral: Negative for depression and memory loss. The patient is not nervous/anxious.     MEDICATIONS AT HOME:   Prior to Admission medications   Medication Sig Start Date End Date Taking? Authorizing Provider  atorvastatin (LIPITOR) 10 MG tablet Take 10 mg by mouth every evening.    Yes [provider]  carvedilol (COREG) 12.5 MG tablet Take 12.5 mg by mouth 2 (two) times daily.  11/06/16  Yes [provider]  doxazosin (CARDURA) 8 MG tablet Take 4 mg by mouth daily.    Yes [provider]  gabapentin (NEURONTIN) 800 MG tablet Take 1 tablet (800 mg total) by mouth at bedtime. Must last 30 days 02/08/19 05/09/19 Yes Milinda Pointer, MD  potassium chloride SA (K-DUR,KLOR-CON) 20 MEQ tablet Take 20 mEq by mouth daily.    Yes [provider]  acetaminophen (TYLENOL) 500 MG tablet 500 mg every 6 (six) hours as needed for Pain.    [provider]  aspirin-acetaminophen-caffeine (EXCEDRIN MIGRAINE) [phone] MG tablet Take 2 tablets by mouth as needed.    [provider]  ferrous sulfate 325 (65 FE) MG tablet Take 1 tablet (325 mg total) by mouth 2 (two) times daily with a meal. 06/22/17   Demetrios Loll, MD  ibuprofen (ADVIL) 200 MG tablet Take 200 mg by mouth as needed.    [provider]  pantoprazole  (PROTONIX) 40 MG tablet Take 1 tablet (40 mg total) by mouth 2 (two) times daily before a meal. 06/22/17   Demetrios Loll, MD      VITAL SIGNS:  Blood pressure 107/70, pulse 66, temperature 98.8 F (37.1 C), temperature source Oral, resp. rate 16, height 5\' 7"  (1.702 m), SpO2 96 %.  PHYSICAL EXAMINATION:  Physical Exam  GENERAL:  73 y.o.-year-old patient lying in the bed in mild distress.  EYES: Pupils equal, round, reactive to light and accommodation. No scleral icterus. Extraocular muscles intact.  HEENT: Head atraumatic, normocephalic. Oropharynx and nasopharynx clear. No oropharyngeal erythema, moist oral mucosa. NECK:  Supple, no jugular venous distention. No thyroid enlargement, no tenderness.  LUNGS: Normal breath sounds bilaterally, no wheezing, rales, rhonchi. No use of accessory muscles of respiration.  CARDIOVASCULAR: S1, S2 RRR. No murmurs, rubs, gallops, clicks.  ABDOMEN: Soft, Tender in the hypogastric  region, no rebound, rigidity, nondistended. Bowel sounds present. No organomegaly or mass.  EXTREMITIES: No pedal edema, cyanosis, or clubbing. + 2 pedal & radial pulses b/l.   NEUROLOGIC: Cranial nerves II through XII are intact. No focal Motor or sensory deficits appreciated b/l.  PSYCHIATRIC: The patient is alert and oriented x 3.  SKIN: No obvious rash, lesion, or ulcer.   LABORATORY PANEL:   CBC Recent Labs  Lab 04/02/19 1044  WBC 9.4  HGB 11.0*  HCT 33.0*  PLT 144*   ------------------------------------------------------------------------------------------------------------------  Chemistries  Recent Labs  Lab 04/02/19 1044  NA 140  K 4.1  CL 110  CO2 22  GLUCOSE 126*  BUN 61*  CREATININE 3.54*  CALCIUM 8.8*  AST 21  ALT 22  ALKPHOS 55  BILITOT 0.7   ------------------------------------------------------------------------------------------------------------------  Cardiac Enzymes No results for input(s): TROPONINI in the last 168 hours.  ------------------------------------------------------------------------------------------------------------------  RADIOLOGY:  No results found.   IMPRESSION AND PLAN:    73 y.o. male with a known history of hypertension, history of gout, history of duodenal ulcer, anxiety/depression, history of alcohol abuse, osteoarthritis, diabetes, hyperlipidemia, history of previous CVA who presents to the hospital due to lower abdominal pain, difficulty urinating.  1.  Acute renal failure- secondary to urinary retention as patient's bladder scan had over 800 cc of urine. -We will place Foley catheter, also give gentle IV fluids, follow BUN/creatinine and urine output. - We will start the patient on some Flomax, renal dose meds, avoid nephrotoxins. -We will also check a renal ultrasound.  2.  Urinary tract infection- this is based off a urinalysis on admission. -We will treat the patient with IV ceftriaxone, follow urine cultures.  3.  Essential hypertension-continue carvedilol.  4.  History of BPH-continue Cardura, will add some Flomax given his urinary retention and acute kidney injury. - Patient will likely need referral to urology as an outpatient.  5.  GERD-continue Protonix.  6.  Neuropathy-continue gabapentin.    All the records are reviewed and case discussed with ED provider. Management plans discussed with the patient, family and they are in agreement.  CODE STATUS: Full code  TOTAL TIME TAKING CARE OF THIS PATIENT: 45 minutes.    Henreitta Leber M.D on 04/02/2019 at 2:12 PM  Between 7am to 6pm - Pager - [phone]  After 6pm go to [email or web address] - password [email or web address] [facility]  Office  [phone]  CC: Primary care physician; Hortencia Pilar, MD

## 2019-04-02 NOTE — Progress Notes (Signed)
   Carl [name] Day: 0 days Carl [name] is a 73 y.o. male history of hypertension, history of gout, history of duodenal ulcer, anxiety/depression, history of alcohol abuse, osteoarthritis, diabetes, hyperlipidemia, history of previous CVA who presents to the [name] due to lower abdominal pain, difficulty urinating.   Discussed CODE STATUS with the patient at bedside.  Explained to him the difference between DNR and full code.  Patient does want aggressive measures to save his life if needed.  Patient remains to be a full code for now.  Advance care planning discussed with patient  with additional Family at bedside. All questions in regards to overall condition and expected prognosis answered. The decision was made to continue current code status  CODE STATUS: full Time spent: 16 minutes

## 2019-04-03 LAB — HEMOGLOBIN A1C
Hgb A1c MFr Bld: 5.7 % — ABNORMAL HIGH (ref 4.8–5.6)
Mean Plasma Glucose: 116.89 mg/dL

## 2019-04-03 LAB — GLUCOSE, CAPILLARY
Glucose-Capillary: 108 mg/dL — ABNORMAL HIGH (ref 70–99)
Glucose-Capillary: 106 mg/dL — ABNORMAL HIGH (ref 70–99)
Glucose-Capillary: 97 mg/dL (ref 70–99)
Glucose-Capillary: 106 mg/dL — ABNORMAL HIGH (ref 70–99)

## 2019-04-03 LAB — CBC
HCT: 33 % — ABNORMAL LOW (ref 39.0–52.0)
Hemoglobin: 10.8 g/dL — ABNORMAL LOW (ref 13.0–17.0)
MCH: 28.5 pg (ref 26.0–34.0)
MCHC: 32.7 g/dL (ref 30.0–36.0)
MCV: 87.1 fL (ref 80.0–100.0)
Platelets: 142 10*3/uL — ABNORMAL LOW (ref 150–400)
RBC: 3.79 MIL/uL — ABNORMAL LOW (ref 4.22–5.81)
RDW: 16 % — ABNORMAL HIGH (ref 11.5–15.5)
WBC: 7.9 10*3/uL (ref 4.0–10.5)
nRBC: 0 % (ref 0.0–0.2)

## 2019-04-03 LAB — BASIC METABOLIC PANEL
Anion gap: 9 (ref 5–15)
BUN: 47 mg/dL — ABNORMAL HIGH (ref 8–23)
CO2: 20 mmol/L — ABNORMAL LOW (ref 22–32)
Calcium: 8.5 mg/dL — ABNORMAL LOW (ref 8.9–10.3)
Chloride: 113 mmol/L — ABNORMAL HIGH (ref 98–111)
Creatinine, Ser: 2.34 mg/dL — ABNORMAL HIGH (ref 0.61–1.24)
GFR calc Af Amer: 31 mL/min — ABNORMAL LOW (ref >60)
GFR calc non Af Amer: 27 mL/min — ABNORMAL LOW (ref >60)
Glucose, Bld: 124 mg/dL — ABNORMAL HIGH (ref 70–99)
Potassium: 4.1 mmol/L (ref 3.5–5.1)
Sodium: 142 mmol/L (ref 135–145)

## 2019-04-03 MED ORDER — AMLODIPINE BESYLATE 5 MG PO TABS
5.0000 mg | ORAL_TABLET | Freq: Every day | ORAL | Status: DC
  Administered 2019-04-03 – 2019-04-04 (×2): 5 mg via ORAL
  Filled 2019-04-03 (×2): qty 1

## 2019-04-03 MED ORDER — GABAPENTIN 300 MG PO CAPS
300.0000 mg | ORAL_CAPSULE | Freq: Every day | ORAL | Status: DC
  Administered 2019-04-03: 300 mg via ORAL
  Filled 2019-04-03: qty 1

## 2019-04-03 MED ORDER — IBUPROFEN 400 MG PO TABS
400.0000 mg | ORAL_TABLET | Freq: Three times a day (TID) | ORAL | Status: DC | PRN
  Administered 2019-04-03 – 2019-04-04 (×4): 400 mg via ORAL
  Filled 2019-04-03 (×4): qty 1

## 2019-04-03 MED ORDER — MAGNESIUM SULFATE 2 GM/50ML IV SOLN
2.0000 g | Freq: Once | INTRAVENOUS | Status: AC
  Administered 2019-04-03: 2 g via INTRAVENOUS
  Filled 2019-04-03: qty 50

## 2019-04-03 MED ORDER — CARVEDILOL 6.25 MG PO TABS
3.1250 mg | ORAL_TABLET | Freq: Two times a day (BID) | ORAL | Status: DC
  Administered 2019-04-04: 3.125 mg via ORAL
  Filled 2019-04-03 (×3): qty 1

## 2019-04-03 MED ORDER — BUTALBITAL-APAP-CAFFEINE 50-325-40 MG PO TABS
1.0000 | ORAL_TABLET | Freq: Four times a day (QID) | ORAL | Status: DC | PRN
  Administered 2019-04-03 – 2019-04-04 (×4): 1 via ORAL
  Filled 2019-04-03 (×4): qty 1

## 2019-04-03 MED ORDER — PNEUMOCOCCAL VAC POLYVALENT 25 MCG/0.5ML IJ INJ
0.5000 mL | INJECTION | INTRAMUSCULAR | Status: AC
  Administered 2019-04-04: 0.5 mL via INTRAMUSCULAR
  Filled 2019-04-03: qty 0.5

## 2019-04-03 NOTE — Progress Notes (Signed)
Patient ID: Carl [name], male   DOB: May 16, 1946, 73 y.o.   MRN: [id number]  [facility] PROGRESS NOTE  Carl [name] ZPH:[id number] DOB: Jan 02, 1946 DOA: 04/02/2019 PCP: Hortencia Pilar, MD  HPI/Subjective: Patient's major complaint is headache.  He states he does get bad headaches.  He states that he gets steroid injections in his neck.  He normally takes ibuprofen and Tylenol which helps.  Objective: Vitals:   04/02/19 2000 04/03/19 0651  BP: 120/72 (!) 145/95  Pulse: 70 79  Resp: 18 19  Temp: 98.5 F (36.9 C) 98.1 F (36.7 C)  SpO2: 93% 95%     ROS: Review of Systems  Constitutional: Negative for chills and fever.  Eyes: Negative for blurred vision.  Respiratory: Negative for cough and shortness of breath.   Cardiovascular: Negative for chest pain.  Gastrointestinal: Negative for abdominal pain, constipation, diarrhea, nausea and vomiting.  Genitourinary: Negative for dysuria.  Musculoskeletal: Positive for neck pain. Negative for joint pain.  Neurological: Positive for headaches. Negative for dizziness.   Exam: Physical Exam  Constitutional: He is oriented to person, place, and time.  HENT:  Nose: No mucosal edema.  Mouth/Throat: No oropharyngeal exudate or posterior oropharyngeal edema.  Eyes: Pupils are equal, round, and reactive to light. Conjunctivae, EOM and lids are normal.  Neck: No JVD present. Carotid bruit is not present. No edema present. No thyroid mass and no thyromegaly present.  Cardiovascular: S1 normal and S2 normal. Exam reveals no gallop.  No murmur heard. Pulses:      Dorsalis pedis pulses are 2+ on the right side and 2+ on the left side.  Respiratory: No respiratory distress. He has no decreased breath sounds. He has no wheezes. He has no rhonchi. He has no rales.  GI: Soft. Bowel sounds are normal. There is no abdominal tenderness.  Musculoskeletal:     Right ankle: He exhibits no swelling.     Left ankle: He exhibits no swelling.   Lymphadenopathy:    He has no cervical adenopathy.  Neurological: He is alert and oriented to person, place, and time. No cranial nerve deficit.  Skin: Skin is warm. No rash noted. Nails show no clubbing.  Psychiatric: He has a normal mood and affect.      Data Reviewed: Basic Metabolic Panel: Recent Labs  Lab 04/02/19 1044 04/03/19 0418  NA 140 142  K 4.1 4.1  CL 110 113*  CO2 22 20*  GLUCOSE 126* 124*  BUN 61* 47*  CREATININE 3.54* 2.34*  CALCIUM 8.8* 8.5*   Liver Function Tests: Recent Labs  Lab 04/02/19 1044  AST 21  ALT 22  ALKPHOS 55  BILITOT 0.7  PROT 6.3*  ALBUMIN 2.9*   CBC: Recent Labs  Lab 04/02/19 1044 04/03/19 0418  WBC 9.4 7.9  HGB 11.0* 10.8*  HCT 33.0* 33.0*  MCV 87.1 87.1  PLT 144* 142*    CBG: Recent Labs  Lab 04/02/19 1653 04/02/19 2119 04/03/19 0744 04/03/19 1226  GLUCAP 109* 119* 108* 106*    Recent Results (from the past 240 hour(s))  SARS Coronavirus 2 [facility] order, Performed in [facility] hospital lab) Nasopharyngeal Nasopharyngeal Swab     Status: None   Collection Time: 04/02/19 12:46 PM   Specimen: Nasopharyngeal Swab  Result Value Ref Range Status   SARS Coronavirus 2 NEGATIVE NEGATIVE Final    Comment: (NOTE) If result is NEGATIVE SARS-CoV-2 target nucleic acids are NOT DETECTED. The SARS-CoV-2 RNA is generally detectable in upper and lower  respiratory specimens during the acute phase of infection. The lowest  concentration of SARS-CoV-2 viral copies this assay can detect is 250  copies / mL. A negative result does not preclude SARS-CoV-2 infection  and should not be used as the sole basis for treatment or other  patient management decisions.  A negative result may occur with  improper specimen collection / handling, submission of specimen other  than nasopharyngeal swab, presence of viral mutation(s) within the  areas targeted by this assay, and inadequate number of viral copies  (<250 copies / mL). A  negative result must be combined with clinical  observations, patient history, and epidemiological information. If result is POSITIVE SARS-CoV-2 target nucleic acids are DETECTED. The SARS-CoV-2 RNA is generally detectable in upper and lower  respiratory specimens dur ing the acute phase of infection.  Positive  results are indicative of active infection with SARS-CoV-2.  Clinical  correlation with patient history and other diagnostic information is  necessary to determine patient infection status.  Positive results do  not rule out bacterial infection or co-infection with other viruses. If result is PRESUMPTIVE POSTIVE SARS-CoV-2 nucleic acids MAY BE PRESENT.   A presumptive positive result was obtained on the submitted specimen  and confirmed on repeat testing.  While 2019 novel coronavirus  (SARS-CoV-2) nucleic acids may be present in the submitted sample  additional confirmatory testing may be necessary for epidemiological  and / or clinical management purposes  to differentiate between  SARS-CoV-2 and other Sarbecovirus currently known to infect humans.  If clinically indicated additional testing with an alternate test  methodology [phone]) is advised. The SARS-CoV-2 RNA is generally  detectable in upper and lower respiratory sp ecimens during the acute  phase of infection. The expected result is Negative. Fact Sheet for Patients:  StrictlyIdeas.no Fact Sheet for Healthcare Providers: BankingDealers.co.za This test is not yet approved or cleared by the [place] FDA and has been authorized for detection and/or diagnosis of SARS-CoV-2 by FDA under an Emergency Use Authorization (EUA).  This EUA will remain in effect (meaning this test can be used) for the duration of the COVID-19 declaration under Section 564(b)(1) of the Act, 21 U.S.C. section 360bbb-3(b)(1), unless the authorization is terminated or revoked sooner. Performed  at [facility], [address] [place], [place] [address]      Studies: US Renal  Result Date: 04/02/2019 CLINICAL DATA:  Acute renal failure EXAM: RENAL / URINARY TRACT ULTRASOUND COMPLETE COMPARISON:  None. FINDINGS: Right Kidney: Renal measurements: 11.3 x 5.4 x 5.3 cm = volume: 169. ML . Echogenicity within normal limits. No mass or hydronephrosis visualized. 9 mm right upper pole cyst. Left Kidney: Renal measurements: 11.5 x 5.7 x 5.6 cm = volume: 194 mL. Echogenicity within normal limits. No mass or hydronephrosis visualized. Bladder: Bladder empty with Foley catheter. IMPRESSION: Negative renal ultrasound. Electronically Signed   By: Franchot Gallo M.D.   On: 04/02/2019 15:31    Scheduled Meds: . heparin  5,000 Units Subcutaneous Q8H  . insulin aspart  0-5 Units Subcutaneous QHS  . insulin aspart  0-9 Units Subcutaneous TID WC  . tamsulosin  0.4 mg Oral Daily   Continuous Infusions: . sodium chloride 100 mL/hr at 04/03/19 0458  . cefTRIAXone (ROCEPHIN)  IV 1 g (04/02/19 1638)    Assessment/Plan:  1. Acute kidney injury secondary to urinary retention.  Foley catheter placed and IV fluids going.  Creatinine has come down from 3.54 to2.34.  Hopefully creatinine will be close to  baseline tomorrow. 2. BPH with urinary obstruction.  Patient started on Flomax.  Follow-up with urology in 1 week for voiding trial. 3. Urinary tract infection secondary to stagnant urine.  Follow-up urine culture.  On IV Rocephin. 4. Essential hypertension add back low-dose Norvasc and low-dose Coreg 5. Neuropathy.  Will restart lower dose gabapentin. 6. Headache.  Careful with ibuprofen with the acute kidney injury.  Continue Tylenol.  Trial of magnesium IV.  Code Status:     Code Status Orders  (From admission, onward)         Start     Ordered   04/02/19 1548  Full code  Continuous     04/02/19 1547        Code Status History    Date Active Date Inactive Code Status Order ID  Comments User Context   06/19/2017 1654 06/22/2017 1838 Full Code [id number]  Demetrios Loll, MD ED   Advance Care Planning Activity     Family Communication: Left message for wife on the phone Disposition Plan: Potential disposition tomorrow if creatinine better and urine culture resulted  Consultants:  Urology  Antibiotics:  Rocephin  Time spent: 28 minutes  [place]

## 2019-04-03 NOTE — Progress Notes (Signed)
Pt has had headaches all day behind his left eye, back of his head, and down into his shoulders. He was given fioricet, tylenol, advil all day with no relief per the patient.  We are trying a kpad around his neck area to see if that relieves any tension.

## 2019-04-04 LAB — BASIC METABOLIC PANEL
Anion gap: 10 (ref 5–15)
BUN: 24 mg/dL — ABNORMAL HIGH (ref 8–23)
CO2: 22 mmol/L (ref 22–32)
Calcium: 8.3 mg/dL — ABNORMAL LOW (ref 8.9–10.3)
Chloride: 108 mmol/L (ref 98–111)
Creatinine, Ser: 1.33 mg/dL — ABNORMAL HIGH (ref 0.61–1.24)
GFR calc Af Amer: >60 mL/min (ref >60)
GFR calc non Af Amer: 53 mL/min — ABNORMAL LOW (ref >60)
Glucose, Bld: 100 mg/dL — ABNORMAL HIGH (ref 70–99)
Potassium: 3.2 mmol/L — ABNORMAL LOW (ref 3.5–5.1)
Sodium: 140 mmol/L (ref 135–145)

## 2019-04-04 LAB — URINE CULTURE: Culture: >=100000 — AB

## 2019-04-04 LAB — GLUCOSE, CAPILLARY: Glucose-Capillary: 94 mg/dL (ref 70–99)

## 2019-04-04 MED ORDER — TAMSULOSIN HCL 0.4 MG PO CAPS: 0.4000 mg | ORAL_CAPSULE | Freq: Every day | ORAL | 0 refills | Status: DC

## 2019-04-04 MED ORDER — AMLODIPINE BESYLATE 10 MG PO TABS: 5.0000 mg | ORAL_TABLET | Freq: Every day | ORAL | Status: AC

## 2019-04-04 MED ORDER — CARVEDILOL 3.125 MG PO TABS: 3.1250 mg | ORAL_TABLET | Freq: Two times a day (BID) | ORAL | 0 refills | Status: DC

## 2019-04-04 MED ORDER — CIPROFLOXACIN IN D5W 400 MG/200ML IV SOLN
400.0000 mg | Freq: Two times a day (BID) | INTRAVENOUS | Status: DC
  Administered 2019-04-04: 400 mg via INTRAVENOUS
  Filled 2019-04-04 (×2): qty 200

## 2019-04-04 MED ORDER — GABAPENTIN 300 MG PO CAPS: 300.0000 mg | ORAL_CAPSULE | Freq: Every day | ORAL | 0 refills | Status: DC

## 2019-04-04 MED ORDER — CIPROFLOXACIN HCL 500 MG PO TABS: 500.0000 mg | ORAL_TABLET | Freq: Two times a day (BID) | ORAL | 0 refills | Status: DC

## 2019-04-04 NOTE — Discharge Summary (Signed)
[facility] NAME: Carl [name]    MR#:  [id number]  DATE OF BIRTH:  1946/02/16  DATE OF ADMISSION:  04/02/2019 ADMITTING PHYSICIAN: Henreitta Leber, MD  DATE OF DISCHARGE: 04/04/2019  1:51 PM  PRIMARY CARE PHYSICIAN: Hortencia Pilar, MD    ADMISSION DIAGNOSIS:  Acute renal failure (ARF) ([place]) [N17.9] AKI (acute kidney injury) ([place]) [N17.9] Urinary tract infection with hematuria, site unspecified [N39.0, R31.9]  DISCHARGE DIAGNOSIS:  Active Problems:   Acute renal failure (ARF) ([place])   SECONDARY DIAGNOSIS:   Past Medical History:  Diagnosis Date  . Adenoma of colon   . Alcoholism ([place])    still drinking wine as of Feb 2013  . Anxiety and depression   . Cataract   . Cervical spondylosis without myelopathy   . Cervicalgia of occipito-atlanto-axial region   . Chronic pain disorder   . Degeneration of cervical intervertebral disc   . Degeneration, intervertebral disc, cervical   . Duodenal ulcer 2011  . Facet arthropathy, cervical   . Fuchs' corneal dystrophy   . Gastric ulcer 2011   egd at [place] Aug 2011.  . GI bleed   . Gout   . Hypertension   . Impaired fasting glucose   . 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 LVH (left ventricular hypertrophy)   . Mild cognitive impairment   . Mitral insufficiency   . Neck pain   . Neuralgia and neuritis   . Nocturia   . Occipital neuralgia   . Osteoarthritis    neck  . Osteoarthritis   . Pure hypercholesterolemia   . Radiculitis   . Stroke ([place])   . Tricuspid insufficiency     HOSPITAL COURSE:   1.  Acute kidney injury secondary to urinary retention.  Foley catheter was placed and IV fluid hydration was given.  Creatinine came down from 3.54 down to 1.33 on discharge.  Creatinine should continue to improve as outpatient.  IV fluids were stopped. 2.  BPH with urinary obstruction.  A Foley catheter was placed.  Patient  will go home with Foley catheter.  Instructions given about Foley catheter care.  Spoke with patient about Foley catheter.  Nursing to provide patient in wife with instructions on how to drain the Foley catheter.  The patient was started on Flomax.  The patient will follow-up with urology in 1 week for voiding trial. 3.  Urinary tract infection secondary to stagnant urine.  Urine culture growing Enterobacter species.  Unfortunately this was resistant to ceftriaxone which she was given in the hospital.  I did switch the patient over to Cipro because this had the best sensitivities.  Risk benefit ratio weighed on prescribing Cipro.  Risk of tendon rupture explained to patient.  I did not prescribe Bactrim because it sensitivity was worse and the patient is recovering from acute kidney injury. 4.  Essential hypertension on half dose Norvasc and low-dose Coreg 5.  Neuropathy on lower dose gabapentin 6.  Headache.  I mentioned to the patient and wife he must be careful with ibuprofen with creatinine abnormalities.  Continue Tylenol.  DISCHARGE CONDITIONS:   Satisfactory  CONSULTS OBTAINED:  Treatment Team:  [name], MD  DRUG ALLERGIES:   Allergies  Allergen Reactions  . Ace Inhibitors Other (See Comments)    unknown  . Aspirin     Other reaction(s): Other (See Comments) He gets GI bleed.    Marland Kitchen  Hydrochlorothiazide Other (See Comments)    unknown  . Lisinopril Other (See Comments)    unknown  . Nsaids Other (See Comments)    Gi bleed  . Ramipril Other (See Comments)    unknown    DISCHARGE MEDICATIONS:   Allergies as of 04/04/2019      Reactions   Ace Inhibitors Other (See Comments)   unknown   Aspirin    Other reaction(s): Other (See Comments) He gets GI bleed.     Hydrochlorothiazide Other (See Comments)   unknown   Lisinopril Other (See Comments)   unknown   Nsaids Other (See Comments)   Gi bleed   Ramipril Other (See Comments)   unknown      Medication List     STOP taking these medications   doxazosin 8 MG tablet Commonly known as: CARDURA   gabapentin 800 MG tablet Commonly known as: Neurontin Replaced by: gabapentin 300 MG capsule   losartan 100 MG tablet Commonly known as: COZAAR   pantoprazole 40 MG tablet Commonly known as: PROTONIX   potassium chloride SA 20 MEQ tablet Commonly known as: K-DUR     TAKE these medications   acetaminophen 500 MG tablet Commonly known as: TYLENOL Take 500-1,000 mg by mouth every 6 (six) hours as needed for mild pain, moderate pain or fever.   amLODipine 10 MG tablet Commonly known as: NORVASC Take 0.5 tablets (5 mg total) by mouth daily. What changed: how much to take   atorvastatin 10 MG tablet Commonly known as: LIPITOR Take 10 mg by mouth every evening.   carvedilol 3.125 MG tablet Commonly known as: COREG Take 1 tablet (3.125 mg total) by mouth 2 (two) times daily with a meal. What changed:   medication strength  how much to take  when to take this   ciprofloxacin 500 MG tablet Commonly known as: Cipro Take 1 tablet (500 mg total) by mouth 2 (two) times daily.   ferrous sulfate 325 (65 FE) MG tablet Take 1 tablet (325 mg total) by mouth 2 (two) times daily with a meal.   gabapentin 300 MG capsule Commonly known as: NEURONTIN Take 1 capsule (300 mg total) by mouth at bedtime. Replaces: gabapentin 800 MG tablet   ipratropium 0.06 % nasal spray Commonly known as: ATROVENT Place 2 sprays into both nostrils 3 (three) times daily.   tamsulosin 0.4 MG Caps capsule Commonly known as: FLOMAX Take 1 capsule (0.4 mg total) by mouth daily.        DISCHARGE INSTRUCTIONS:   Follow-up PMD 5 days Follow-up urology 1 week   If you experience worsening of your admission symptoms, develop shortness of breath, life threatening emergency, suicidal or homicidal thoughts you must seek medical attention immediately by calling 911 or calling your MD immediately  if symptoms less  severe.  You Must read complete instructions/literature along with all the possible adverse reactions/side effects for all the Medicines you take and that have been prescribed to you. Take any new Medicines after you have completely understood and accept all the possible adverse reactions/side effects.   Please note  You were cared for by a hospitalist during your hospital stay. If you have any questions about your discharge medications or the care you received while you were in the hospital after you are discharged, you can call the unit and asked to speak with the hospitalist on call if the hospitalist that took care of you is not available. Once you are discharged, your primary care physician  will handle any further medical issues. Please note that NO REFILLS for any discharge medications will be authorized once you are discharged, as it is imperative that you return to your primary care physician (or establish a relationship with a primary care physician if you do not have one) for your aftercare needs so that they can reassess your need for medications and monitor your lab values.    Today   CHIEF COMPLAINT:   Chief Complaint  Patient presents with  . Dysuria    HISTORY OF PRESENT ILLNESS:  Carl [name]  is a 73 y.o. male came in with trouble urinating   VITAL SIGNS:  Blood pressure 132/73, pulse 78, temperature 98.1 F (36.7 C), temperature source Oral, resp. rate (!) 24, height 5\' 7"  (1.702 m), weight 98.7 kg, SpO2 96 %. Respirations were 16 not 24  PHYSICAL EXAMINATION:  GENERAL:  73 y.o.-year-old patient lying in the bed with no acute distress.  EYES: Pupils equal, round, reactive to light and accommodation. No scleral icterus. Extraocular muscles intact.  HEENT: Head atraumatic, normocephalic. Oropharynx and nasopharynx clear.  NECK:  Supple, no jugular venous distention. No thyroid enlargement, no tenderness.  LUNGS: Normal breath sounds bilaterally, no wheezing,  rales,rhonchi or crepitation. No use of accessory muscles of respiration.  CARDIOVASCULAR: S1, S2 normal. No murmurs, rubs, or gallops.  ABDOMEN: Soft, non-tender, non-distended. Bowel sounds present. No organomegaly or mass.  EXTREMITIES: Trace pedal edema, no cyanosis, or clubbing.  NEUROLOGIC: Cranial nerves II through XII are intact. Muscle strength 5/5 in all extremities. Sensation intact. Gait not checked.  PSYCHIATRIC: The patient is alert and oriented x 3.  SKIN: No obvious rash, lesion, or ulcer.   DATA REVIEW:   CBC Recent Labs  Lab 04/03/19 0418  WBC 7.9  HGB 10.8*  HCT 33.0*  PLT 142*    Chemistries  Recent Labs  Lab 04/02/19 1044  04/04/19 0413  NA 140   < > 140  K 4.1   < > 3.2*  CL 110   < > 108  CO2 22   < > 22  GLUCOSE 126*   < > 100*  BUN 61*   < > 24*  CREATININE 3.54*   < > 1.33*  CALCIUM 8.8*   < > 8.3*  AST 21  --   --   ALT 22  --   --   ALKPHOS 55  --   --   BILITOT 0.7  --   --    < > = values in this interval not displayed.    Microbiology Results  Results for orders placed or performed during the hospital encounter of 04/02/19  Urine culture     Status: Abnormal   Collection Time: 04/02/19 10:44 AM   Specimen: Urine, Random  Result Value Ref Range Status   Specimen Description   Final    URINE, RANDOM Performed at [facility], [address] [place], [place] [address]    Special Requests   Final    NONE Performed at [facility], [address] [place], [place] [address]    Culture >=100,000 COLONIES/mL ENTEROBACTER SPECIES (A)  Final   Report Status 04/04/2019 FINAL  Final   Organism ID, Bacteria ENTEROBACTER SPECIES (A)  Final      Susceptibility   Enterobacter species - MIC*    CEFAZOLIN >=64 RESISTANT Resistant     CEFTRIAXONE >=64 RESISTANT Resistant     CIPROFLOXACIN <=0.25 SENSITIVE Sensitive     GENTAMICIN <=  1 SENSITIVE Sensitive     IMIPENEM <=0.25 SENSITIVE Sensitive     NITROFURANTOIN 64  INTERMEDIATE Intermediate     TRIMETH/SULFA <=20 SENSITIVE Sensitive     PIP/TAZO >=128 RESISTANT Resistant     * >=100,000 COLONIES/mL ENTEROBACTER SPECIES  SARS Coronavirus 2 [facility] order, Performed in [facility] hospital lab) Nasopharyngeal Nasopharyngeal Swab     Status: None   Collection Time: 04/02/19 12:46 PM   Specimen: Nasopharyngeal Swab  Result Value Ref Range Status   SARS Coronavirus 2 NEGATIVE NEGATIVE Final    Comment: (NOTE) If result is NEGATIVE SARS-CoV-2 target nucleic acids are NOT DETECTED. The SARS-CoV-2 RNA is generally detectable in upper and lower  respiratory specimens during the acute phase of infection. The lowest  concentration of SARS-CoV-2 viral copies this assay can detect is 250  copies / mL. A negative result does not preclude SARS-CoV-2 infection  and should not be used as the sole basis for treatment or other  patient management decisions.  A negative result may occur with  improper specimen collection / handling, submission of specimen other  than nasopharyngeal swab, presence of viral mutation(s) within the  areas targeted by this assay, and inadequate number of viral copies  (<250 copies / mL). A negative result must be combined with clinical  observations, patient history, and epidemiological information. If result is POSITIVE SARS-CoV-2 target nucleic acids are DETECTED. The SARS-CoV-2 RNA is generally detectable in upper and lower  respiratory specimens dur ing the acute phase of infection.  Positive  results are indicative of active infection with SARS-CoV-2.  Clinical  correlation with patient history and other diagnostic information is  necessary to determine patient infection status.  Positive results do  not rule out bacterial infection or co-infection with other viruses. If result is PRESUMPTIVE POSTIVE SARS-CoV-2 nucleic acids MAY BE PRESENT.   A presumptive positive result was obtained on the submitted specimen  and confirmed on  repeat testing.  While 2019 novel coronavirus  (SARS-CoV-2) nucleic acids may be present in the submitted sample  additional confirmatory testing may be necessary for epidemiological  and / or clinical management purposes  to differentiate between  SARS-CoV-2 and other Sarbecovirus currently known to infect humans.  If clinically indicated additional testing with an alternate test  methodology [id number]) is advised. The SARS-CoV-2 RNA is generally  detectable in upper and lower respiratory sp ecimens during the acute  phase of infection. The expected result is Negative. Fact Sheet for Patients:  StrictlyIdeas.no Fact Sheet for Healthcare Providers: BankingDealers.co.za This test is not yet approved or cleared by the [place] FDA and has been authorized for detection and/or diagnosis of SARS-CoV-2 by FDA under an Emergency Use Authorization (EUA).  This EUA will remain in effect (meaning this test can be used) for the duration of the COVID-19 declaration under Section 564(b)(1) of the Act, 21 U.S.C. section 360bbb-3(b)(1), unless the authorization is terminated or revoked sooner. Performed at [facility], [address] [place], [place] [address]     RADIOLOGY:  US Renal  Result Date: 04/02/2019 CLINICAL DATA:  Acute renal failure EXAM: RENAL / URINARY TRACT ULTRASOUND COMPLETE COMPARISON:  None. FINDINGS: Right Kidney: Renal measurements: 11.3 x 5.4 x 5.3 cm = volume: 169. ML . Echogenicity within normal limits. No mass or hydronephrosis visualized. 9 mm right upper pole cyst. Left Kidney: Renal measurements: 11.5 x 5.7 x 5.6 cm = volume: 194 mL. Echogenicity within normal limits. No mass or hydronephrosis visualized. Bladder: Bladder empty  with Foley catheter. IMPRESSION: Negative renal ultrasound. Electronically Signed   By: Franchot Gallo M.D.   On: 04/02/2019 15:31      Management plans discussed with the patient,  family and they are in agreement.  CODE STATUS:     Code Status Orders  (From admission, onward)         Start     Ordered   04/02/19 1548  Full code  Continuous     04/02/19 1547        Code Status History    Date Active Date Inactive Code Status Order ID Comments User Context   06/19/2017 1654 06/22/2017 1838 Full Code [id number]  Demetrios Loll, MD ED   Advance Care Planning Activity      TOTAL TIME TAKING CARE OF THIS PATIENT: 35 minutes.    Loletha Grayer M.D on 04/04/2019 at 2:18 PM  Between 7am to 6pm - Pager - [phone]  After 6pm go to [email or web address] - password [email or web address]  [employer] Office  [phone]  CC: Primary care physician; Hortencia Pilar, MD

## 2019-04-04 NOTE — Discharge Instructions (Signed)
Acute Kidney Injury, Adult ° °Acute kidney injury is a sudden worsening of kidney function. The kidneys are organs that have several jobs. They filter the blood to remove waste products and extra fluid. They also maintain a healthy balance of minerals and hormones in the body, which helps control blood pressure and keep bones strong. With this condition, your kidneys do not do their jobs as well as they should. °This condition ranges from mild to severe. Over time it may develop into long-lasting (chronic) kidney disease. Early detection and treatment may prevent acute kidney injury from developing into a chronic condition. °What are the causes? °Common causes of this condition include: °· A problem with blood flow to the kidneys. This may be caused by: °? Low blood pressure (hypotension) or shock. °? Blood loss. °? Heart and blood vessel (cardiovascular) disease. °? Severe burns. °? Liver disease. °· Direct damage to the kidneys. This may be caused by: °? Certain medicines. °? A kidney infection. °? Poisoning. °? Being around or in contact with toxic substances. °? A surgical wound. °? A hard, direct hit to the kidney area. °· A sudden blockage of urine flow. This may be caused by: °? Cancer. °? Kidney stones. °? An enlarged prostate in males. °What are the signs or symptoms? °Symptoms of this condition may not be obvious until the condition becomes severe. Symptoms of this condition can include: °· Tiredness (lethargy), or difficulty staying awake. °· Nausea or vomiting. °· Swelling (edema) of the face, legs, ankles, or feet. °· Problems with urination, such as: °? Abdominal pain, or pain along the side of your stomach (flank). °? Decreased urine production. °? Decrease in the force of urine flow. °· Muscle twitches and cramps, especially in the legs. °· Confusion or trouble concentrating. °· Loss of appetite. °· Fever. °How is this diagnosed? °This condition may be diagnosed with tests, including: °· Blood  tests. °· Urine tests. °· Imaging tests. °· A test in which a sample of tissue is removed from the kidneys to be examined under a microscope (kidney biopsy). °How is this treated? °Treatment for this condition depends on the cause and how severe the condition is. In mild cases, treatment may not be needed. The kidneys may heal on their own. In more severe cases, treatment will involve: °· Treating the cause of the kidney injury. This may involve changing any medicines you are taking or adjusting your dosage. °· Fluids. You may need specialized IV fluids to balance your body's needs. °· Having a catheter placed to drain urine and prevent blockages. °· Preventing problems from occurring. This may mean avoiding certain medicines or procedures that can cause further injury to the kidneys. °In some cases treatment may also require: °· A procedure to remove toxic wastes from the body (dialysis or continuous renal replacement therapy - CRRT). °· Surgery. This may be done to repair a torn kidney, or to remove the blockage from the urinary system. °Follow these instructions at home: °Medicines °· Take over-the-counter and prescription medicines only as told by your health care provider. °· Do not take any new medicines without your health care provider's approval. Many medicines can worsen your kidney damage. °· Do not take any vitamin and mineral supplements without your health care provider's approval. Many nutritional supplements can worsen your kidney damage. °Lifestyle °· If your health care provider prescribed changes to your diet, follow them. You may need to decrease the amount of protein you eat. °· Achieve and maintain a healthy   weight. If you need help with this, ask your health care provider. °· Start or continue an exercise plan. Try to exercise at least 30 minutes a day, 5 days a week. °· Do not use any tobacco products, such as cigarettes, chewing tobacco, and e-cigarettes. If you need help quitting, ask your  health care provider. °General instructions °· Keep track of your blood pressure. Report changes in your blood pressure as told by your health care provider. °· Stay up to date with immunizations. Ask your health care provider which immunizations you need. °· Keep all follow-up visits as told by your health care provider. This is important. °Where to find more information °· American Association of Kidney Patients: www.aakp.org °· National Kidney Foundation: www.kidney.org °· American Kidney Fund: www.akfinc.org °· Life Options Rehabilitation Program: °? www.lifeoptions.org °? www.kidneyschool.org °Contact a health care provider if: °· Your symptoms get worse. °· You develop new symptoms. °Get help right away if: °· You develop symptoms of worsening kidney disease, which include: °? Headaches. °? Abnormally dark or light skin. °? Easy bruising. °? Frequent hiccups. °? Chest pain. °? Shortness of breath. °? End of menstruation in women. °? Seizures. °? Confusion or altered mental status. °? Abdominal or back pain. °? Itchiness. °· You have a fever. °· Your body is producing less urine. °· You have pain or bleeding when you urinate. °Summary °· Acute kidney injury is a sudden worsening of kidney function. °· Acute kidney injury can be caused by problems with blood flow to the kidneys, direct damage to the kidneys, and sudden blockage of urine flow. °· Symptoms of this condition may not be obvious until it becomes severe. Symptoms may include edema, lethargy, confusion, nausea or vomiting, and problems passing urine. °· This condition can usually be diagnosed with blood tests, urine tests, and imaging tests. Sometimes a kidney biopsy is done to diagnose this condition. °· Treatment for this condition often involves treating the underlying cause. It is treated with fluids, medicines, dialysis, diet changes, or surgery. °This information is not intended to replace advice given to you by your health care provider. Make  sure you discuss any questions you have with your health care provider. °Document Released: 03/04/2011 Document Revised: 08/01/2017 Document Reviewed: 08/09/2016 °Elsevier Patient Education © 2020 Elsevier Inc. ° °

## 2019-04-04 NOTE — Plan of Care (Signed)
Patient clear for discharge. IV removed, patient and wife educated on foley care and drainage. Using teachback method. AVS printed and given, pharmacy verified. Discharged to home with indwelling foley by POV.  Cloretta Ned, RN,04/04/2019 1:06 PM

## 2019-04-07 ENCOUNTER — Encounter: Payer: Self-pay | Admitting: Pain Medicine

## 2019-04-07 ENCOUNTER — Ambulatory Visit (INDEPENDENT_AMBULATORY_CARE_PROVIDER_SITE_OTHER): Payer: Medicare Other

## 2019-04-07 ENCOUNTER — Ambulatory Visit: Payer: Medicare Other

## 2019-04-07 ENCOUNTER — Other Ambulatory Visit: Payer: Self-pay

## 2019-04-07 DIAGNOSIS — R339 Retention of urine, unspecified: Secondary | ICD-10-CM

## 2019-04-07 LAB — BLADDER SCAN AMB NON-IMAGING

## 2019-04-07 MED ORDER — TAMSULOSIN HCL 0.4 MG PO CAPS: 0.4000 mg | ORAL_CAPSULE | Freq: Every day | ORAL | 3 refills | Status: DC

## 2019-04-07 NOTE — Progress Notes (Signed)
Catheter Removal  Patient is present today for a catheter removal.  64ml of water was drained from the balloon. A 16FR foley cath was removed from the bladder no complications were noted . Patient tolerated well.  Preformed by: Gordy Clement, CMA  Follow up/ Additional notes: RTC this afternoon for PVR  Continuous Intermittent Catheterization  Due to urinary retention patient is present today for a teaching of self I & O Catheterization. Patient's PVR is 238mL. He states he has been unable to void since leaving clinic this morning. Patient was given detailed verbal and printed instructions of self catheterization. Patient was cleaned and prepped in a sterile fashion.  With instruction and assistance patient inserted a 14FR Flex Coude and urine return was noted 134ml, urine was dark yellow in color. Patient tolerated well, no complications were noted Patient was given a sample bag with supplies to take home.  Instructions were given for pt to self cath as needed when unable to void. Flomax refills given in order to hold pt over until follow up appt.   Preformed by: Sherril Cong, CMA (AAMA)  Additional Notes: RTC as scheduled

## 2019-04-07 NOTE — Patient Instructions (Signed)

## 2019-04-08 ENCOUNTER — Encounter: Payer: Self-pay | Admitting: Pain Medicine

## 2019-04-08 ENCOUNTER — Ambulatory Visit: Payer: Medicare Other | Attending: Pain Medicine | Admitting: Pain Medicine

## 2019-04-08 DIAGNOSIS — M4722 Other spondylosis with radiculopathy, cervical region: Secondary | ICD-10-CM | POA: Diagnosis not present

## 2019-04-08 DIAGNOSIS — R51 Headache: Secondary | ICD-10-CM

## 2019-04-08 DIAGNOSIS — G894 Chronic pain syndrome: Secondary | ICD-10-CM

## 2019-04-08 DIAGNOSIS — M542 Cervicalgia: Secondary | ICD-10-CM | POA: Diagnosis not present

## 2019-04-08 DIAGNOSIS — G8929 Other chronic pain: Secondary | ICD-10-CM

## 2019-04-08 NOTE — Patient Instructions (Signed)

## 2019-04-08 NOTE — Progress Notes (Signed)
Pain Management Virtual Encounter Note - Virtual Visit via Telephone Telehealth (real-time audio visits between healthcare provider and patient).   Patient's Phone No. & Preferred Pharmacy:  [phone] (home); [phone] (mobile); (Preferred) [phone] No e-mail address on record  [facility] [place], [place] - [address] [place] [address] Phone: [phone] Fax: [phone]    Pre-screening note:  Our staff contacted Mr. Treece and offered him an "in person", "face-to-face" appointment versus a telephone encounter. He indicated preferring the telephone encounter, at this time.   Reason for Virtual Visit: COVID-19*  Social distancing based on CDC and AMA recommendations.   I contacted [name] on 04/08/2019 via telephone.      I clearly identified myself as Gaspar Cola, MD. I verified that I was speaking with the correct person using two identifiers (Name: KACE HARTJE, and date of birth: April 04, 1946).  Advanced Informed Consent I sought verbal advanced consent from [name] for virtual visit interactions. I informed Mr. Stein of possible security and privacy concerns, risks, and limitations associated with providing "not-in-person" medical evaluation and management services. I also informed Mr. Olveda of the availability of "in-person" appointments. Finally, I informed him that there would be a charge for the virtual visit and that he could be  personally, fully or partially, financially responsible for it. Mr. Sternberg expressed understanding and agreed to proceed.   Historic Elements   Mr. JUSTIS CLOSSER is a 73 y.o. year old, male patient evaluated today after his last encounter by our practice on 03/17/2019. Mr. 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 He also  has a past surgical history that includes Cystogram (03/08); Esophagogastroduodenoscopy (egd) with propofol (N/A, 06/20/2017); and Colonoscopy with propofol (N/A, 06/05/2018). Mr. Reffett has a current medication list which includes the following prescription(s): acetaminophen, amlodipine, atorvastatin, carvedilol, ciprofloxacin, ferrous sulfate, gabapentin, ipratropium, and tamsulosin. He  reports that he quit smoking about 40 years ago. His smoking use included cigarettes. He has a 30.00 pack-year smoking history. He has never used smokeless tobacco. He reports that he does not drink alcohol or use drugs. Mr. Bloyd is allergic to ace inhibitors; aspirin; hydrochlorothiazide; lisinopril; nsaids; and ramipril.   HPI  Today, he is being contacted for medication management.  The patient describes having had to go down on the gabapentin secondary to kidney problems.  Apparently he spent this past week in the hospital with a urinary tract infection were he was pain pus.  He was placed on antibiotics and his glomerular filtration rate came back decreased.  Because of this, his physician lowered the gabapentin dose.  In any case, he describes not being able to tell a big difference between the higher dose and the lower dose that he is now on.  This would suggest that the gabapentin has not really helping him that much.  He is interested in having his  cervical epidural steroid injection repeated, but I would like to wait until he is done  with his antibiotics and he is over the urinary tract infection.  The patient was instructed to call as soon as that happens so that we can schedule him for his second cervical epidural steroid injection.  Pharmacotherapy Assessment  Analgesic: None from our practice.   Monitoring: Pharmacotherapy: No side-effects or adverse reactions reported. [place] PMP: PDMP reviewed during this encounter.       Compliance: No problems identified. Effectiveness: Clinically acceptable. Plan: Refer to "POC".  UDS:  Summary  Date Value Ref Range Status  07/09/2018 FINAL  Final    Comment:    ==================================================================== TOX[facility]RE COMP DRUG ANALYSIS,UR ==================================================================== Test                             Result       Flag       Units Drug Present and Declared for Prescription Verification   Gabapentin                     PRESENT      EXPECTED   Acetaminophen                  PRESENT      EXPECTED   Salicylate                     PRESENT      EXPECTED   Ibuprofen                      PRESENT      EXPECTED   Clonidine                      PRESENT      EXPECTED Drug Absent but Declared for Prescription Verification   Hydrocodone                    Not Detected UNEXPECTED ng/mg creat   Duloxetine                     Not Detected UNEXPECTED   Verapamil                      Not Detected UNEXPECTED ==================================================================== Test                      Result    Flag   Units      Ref Range   Creatinine              160              mg/dL      >=20 ==================================================================== Declared Medications:  The flagging and interpretation on this report are based on the  following declared medications.  Unexpected results may arise from  inaccuracies in the  declared medications.  **Note: The testing scope of this panel includes these medications:  Clonidine  Duloxetine  Gabapentin  Hydrocodone (Hydrocodone-Acetaminophen)  Verapamil  **Note: The testing scope of this panel does not include small to  moderate amounts of these reported medications:  Acetaminophen  Acetaminophen (Excedrin)  Acetaminophen (Hydrocodone-Acetaminophen)  Aspirin (Excedrin)  Ibuprofen  **Note: The testing scope of this panel does not include following  reported medications:  Amlodipine  Amlodipine Besylate  Atorvastatin  Caffeine (Excedrin)  Carvedilol  Cyanocobalamin  Doxazosin  Hydralazine  Iron (Ferrous Sulfate)  Losartan  Pantoprazole  Polyethylene Glycol  Potassium  Vitamin D2 (Ergocalciferol) ==================================================================== For clinical consultation, please call [phone]. ====================================================================    Laboratory Chemistry Profile (12 mo)  Renal: 07/09/2018: BUN/Creatinine Ratio 21 04/04/2019: BUN 24; Creatinine, Ser 1.33  Lab Results  Component Value Date   GFRAA >60 04/04/2019   GFRNONAA 53 (L) 04/04/2019   Hepatic: 04/02/2019: Albumin 2.9 Lab Results  Component Value Date   AST 21 04/02/2019   ALT 22 04/02/2019   Other: 07/09/2018: 25-Hydroxy, Vitamin D 26; 25-Hydroxy, Vitamin D-2 7.2; 25-Hydroxy, Vitamin D-3 19; CRP 1; Sed Rate 9; Vitamin B-12 396 Note: Above Lab results reviewed.  Imaging  Last 90 days:  US Renal  Result Date: 04/02/2019 CLINICAL DATA:  Acute renal failure EXAM: RENAL / URINARY TRACT ULTRASOUND COMPLETE COMPARISON:  None. FINDINGS: Right Kidney: Renal measurements: 11.3 x 5.4 x 5.3 cm = volume: 169. ML . Echogenicity within normal limits. No mass or hydronephrosis visualized. 9 mm right upper pole cyst. Left Kidney: Renal measurements: 11.5 x 5.7 x 5.6 cm = volume: 194 mL. Echogenicity within normal limits. No mass or hydronephrosis  visualized. Bladder: Bladder empty with Foley catheter. IMPRESSION: Negative renal ultrasound. Electronically Signed   By: Franchot Gallo M.D.   On: 04/02/2019 15:31   [facility] C-arm 1-60 Min No Report  Result Date: 03/01/2019 Fluoro was used, but no Radiologist interpretation will be provided. Please refer to "NOTES" tab for provider progress note.  Last Hospital Admission:  US Renal  Result Date: 04/02/2019 CLINICAL DATA:  Acute renal failure EXAM: RENAL / URINARY TRACT ULTRASOUND COMPLETE COMPARISON:  None. FINDINGS: Right Kidney: Renal measurements: 11.3 x 5.4 x 5.3 cm = volume: 169. ML . Echogenicity within normal limits. No mass or hydronephrosis visualized. 9 mm right upper pole cyst. Left Kidney: Renal measurements: 11.5 x 5.7 x 5.6 cm = volume: 194 mL. Echogenicity within normal limits. No mass or hydronephrosis visualized. Bladder: Bladder empty with Foley catheter. IMPRESSION: Negative renal ultrasound. Electronically Signed   By: Franchot Gallo M.D.   On: 04/02/2019 15:31   Assessment  The primary encounter diagnosis was Chronic pain syndrome. Diagnoses of Cervicalgia (Left), Chronic neck pain (Primary Area of Pain) (Left), Chronic facial pain (Secondary Area of Pain) (Left), Cervical spondylosis with radiculopathy, and Cervicalgia of occipito-atlanto-axial region (Left) were also pertinent to this visit.  Plan of Care  I am having [name] maintain his ferrous sulfate, atorvastatin, acetaminophen, ipratropium, amLODipine, carvedilol, gabapentin, ciprofloxacin, and tamsulosin.  Pharmacotherapy (Medications Ordered): No orders of the defined types were placed in this encounter.  Orders:  Orders Placed This Encounter  Procedures  . Cervical Epidural Injection    Procedure: Cervical Epidural Steroid Injection/Block Purpose: Diagnostic Indication(s): Radiculitis and/or cervicalgia associater with cervical degenerative disc disease.    Standing Status:   Standing     Number of Occurrences:   6    Standing Expiration Date:   10/08/2020    Scheduling Instructions:     Level(s): C7-T1     Laterality: Left-sided     Sedation: Patient's choice.     Timeframe: PRN    Order Specific Question:   Where will this procedure be performed?    Answer:   [facility]    Comments:   by Dr. Dossie Arbour   Follow-up plan:   Return if symptoms worsen or fail to improve, for PRN Procedure: (L) CESI #2 (after he gets over his urinary tract infection).      Interventional management options:  Considering:   Diagnostic/therapeutic  left CESI #2    PRN Procedures:   Diagnostic/therapeutic left CESI #2      Recent Visits Date Type Provider Dept  03/17/19 Office Visit Milinda Pointer, MD [facility]  02/18/19 Procedure visit Milinda Pointer, MD [facility]  02/08/19 Office Visit Milinda Pointer, MD [facility]  01/19/19 Office Visit Milinda Pointer, MD [facility]  Showing recent visits within past 90 days and meeting all other requirements   Today's Visits Date Type Provider Dept  04/08/19 Office Visit Milinda Pointer, MD [facility]  Showing today's visits and meeting all other requirements   Future Appointments No visits were found meeting these conditions.  Showing future appointments within next 90 days and meeting all other requirements   I discussed the assessment and treatment plan with the patient. The patient was provided an opportunity to ask questions and all were answered. The patient agreed with the plan and demonstrated an understanding of the instructions.  Patient advised to call back or seek an in-person evaluation if the symptoms or condition worsens.  Total duration of non-face-to-face encounter: 15 minutes.  Note by: Gaspar Cola, MD Date: 04/08/2019; Time: 2:45 PM  Note: This dictation was prepared with Dragon dictation. Any transcriptional errors that may result from  this process are unintentional.  Disclaimer:  * Given the special circumstances of the COVID-19 pandemic, the federal government has announced that the Office for Civil Rights (OCR) will exercise its enforcement discretion and will not impose penalties on physicians using telehealth in the event of noncompliance with regulatory requirements under the [place] and [place] (HIPAA) in connection with the good faith provision of telehealth during the HAFBX-03 national public health emergency. ([place])

## 2019-04-08 NOTE — Progress Notes (Signed)
Thanks, lets move him up to around 6-8 weeks for a PVR and IPSS, can cancel the November appt, thanks  Nickolas Madrid, MD 04/08/2019

## 2019-04-09 ENCOUNTER — Other Ambulatory Visit: Payer: Self-pay

## 2019-04-09 ENCOUNTER — Ambulatory Visit (INDEPENDENT_AMBULATORY_CARE_PROVIDER_SITE_OTHER): Payer: Medicare Other | Admitting: Urology

## 2019-04-09 ENCOUNTER — Encounter: Payer: Self-pay | Admitting: Urology

## 2019-04-09 VITALS — BP 151/91 | HR 112 | Ht 67.0 in | Wt 198.0 lb

## 2019-04-09 DIAGNOSIS — R339 Retention of urine, unspecified: Secondary | ICD-10-CM

## 2019-04-09 NOTE — Progress Notes (Signed)
Simple Catheter Placement  Due to urinary retention patient is present today for a foley cath placement.  Patient was cleaned and prepped in a sterile fashion with betadine and lidocaine jelly 2% was instilled into the urethra.  A 16 Coude foley catheter was inserted, urine return was noted  55ml, urine was dark yellow in color.  The balloon was filled with 10cc of sterile water.  A leg bag was attached for drainage. Patient was also given a night bag to take home and was given instruction on how to change from one bag to another.  Patient was given instruction on proper catheter care.  Patient tolerated well, no complications were noted   Preformed by: Elberta Leatherwood, CMA  Additional notes/ Follow up: 1 week foley removal and go over CIC again and PM PVR if needed

## 2019-04-15 ENCOUNTER — Ambulatory Visit (INDEPENDENT_AMBULATORY_CARE_PROVIDER_SITE_OTHER): Payer: Medicare Other | Admitting: Physician Assistant

## 2019-04-15 ENCOUNTER — Ambulatory Visit: Payer: Medicare Other

## 2019-04-15 ENCOUNTER — Other Ambulatory Visit: Payer: Self-pay

## 2019-04-15 DIAGNOSIS — R339 Retention of urine, unspecified: Secondary | ICD-10-CM

## 2019-04-15 NOTE — Progress Notes (Addendum)
Catheter Removal  Patient is present today for a catheter removal.  67ml of water was drained from the balloon. A 16FR Coude foley cath was removed from the bladder no complications were noted . Patient tolerated well.  Performed by: Debroah Loop, PA-C  Follow up/ Additional notes: Will return this afternoon for PVR, voiding trial.  Addendum: Patient return to the clinic this afternoon for voiding trial with PVR.  PVR 113 mL.  Patient successfully passed voiding trial and was sent home.  Debroah Loop, PA-C 2:13 PM

## 2019-04-28 ENCOUNTER — Ambulatory Visit: Payer: Medicare Other | Admitting: Pain Medicine

## 2019-05-20 ENCOUNTER — Telehealth: Payer: Self-pay | Admitting: *Deleted

## 2019-05-20 NOTE — Telephone Encounter (Signed)
Patient scheduled for CESI per orders from last visit.

## 2019-05-25 ENCOUNTER — Telehealth: Payer: Self-pay | Admitting: *Deleted

## 2019-05-25 ENCOUNTER — Ambulatory Visit (HOSPITAL_BASED_OUTPATIENT_CLINIC_OR_DEPARTMENT_OTHER): Payer: Medicare Other | Admitting: Pain Medicine

## 2019-05-25 ENCOUNTER — Other Ambulatory Visit: Payer: Self-pay

## 2019-05-25 ENCOUNTER — Encounter: Payer: Self-pay | Admitting: Pain Medicine

## 2019-05-25 ENCOUNTER — Emergency Department
Admission: EM | Admit: 2019-05-25 | Discharge: 2019-05-25 | Disposition: A | Discharge status: Home / Self Care | Payer: Medicare Other | Attending: Emergency Medicine | Admitting: Emergency Medicine

## 2019-05-25 ENCOUNTER — Ambulatory Visit
Admission: RE | Admit: 2019-05-25 | Discharge: 2019-05-25 | Disposition: A | Discharge status: Home / Self Care | Payer: Medicare Other | Source: Ambulatory Visit | Attending: Pain Medicine | Admitting: Pain Medicine

## 2019-05-25 VITALS — BP 116/86 | HR 76 | Temp 98.1°F | Resp 16 | Ht 67.0 in | Wt 195.0 lb

## 2019-05-25 DIAGNOSIS — M5030 Other cervical disc degeneration, unspecified cervical region: Secondary | ICD-10-CM

## 2019-05-25 DIAGNOSIS — R55 Syncope and collapse: Secondary | ICD-10-CM | POA: Insufficient documentation

## 2019-05-25 DIAGNOSIS — M5481 Occipital neuralgia: Secondary | ICD-10-CM | POA: Insufficient documentation

## 2019-05-25 DIAGNOSIS — I10 Essential (primary) hypertension: Secondary | ICD-10-CM | POA: Diagnosis not present

## 2019-05-25 DIAGNOSIS — Z79899 Other long term (current) drug therapy: Secondary | ICD-10-CM | POA: Diagnosis not present

## 2019-05-25 DIAGNOSIS — M4722 Other spondylosis with radiculopathy, cervical region: Secondary | ICD-10-CM

## 2019-05-25 DIAGNOSIS — R51 Headache: Secondary | ICD-10-CM | POA: Insufficient documentation

## 2019-05-25 DIAGNOSIS — M542 Cervicalgia: Secondary | ICD-10-CM | POA: Insufficient documentation

## 2019-05-25 DIAGNOSIS — Z87891 Personal history of nicotine dependence: Secondary | ICD-10-CM | POA: Diagnosis not present

## 2019-05-25 DIAGNOSIS — G8929 Other chronic pain: Secondary | ICD-10-CM

## 2019-05-25 DIAGNOSIS — Z8673 Personal history of transient ischemic attack (TIA), and cerebral infarction without residual deficits: Secondary | ICD-10-CM | POA: Insufficient documentation

## 2019-05-25 LAB — TROPONIN I (HIGH SENSITIVITY)
Troponin I (High Sensitivity): 4 ng/L (ref <18)
Troponin I (High Sensitivity): 4 ng/L (ref <18)

## 2019-05-25 LAB — CBC WITH DIFFERENTIAL/PLATELET
Abs Immature Granulocytes: 0.02 10*3/uL (ref 0.00–0.07)
Basophils Absolute: 0 10*3/uL (ref 0.0–0.1)
Basophils Relative: 1 %
Eosinophils Absolute: 0.1 10*3/uL (ref 0.0–0.5)
Eosinophils Relative: 2 %
HCT: 33 % — ABNORMAL LOW (ref 39.0–52.0)
Hemoglobin: 11.1 g/dL — ABNORMAL LOW (ref 13.0–17.0)
Immature Granulocytes: 0 %
Lymphocytes Relative: 21 %
Lymphs Abs: 1.2 10*3/uL (ref 0.7–4.0)
MCH: 28.8 pg (ref 26.0–34.0)
MCHC: 33.6 g/dL (ref 30.0–36.0)
MCV: 85.7 fL (ref 80.0–100.0)
Monocytes Absolute: 0.5 10*3/uL (ref 0.1–1.0)
Monocytes Relative: 8 %
Neutro Abs: 4.1 10*3/uL (ref 1.7–7.7)
Neutrophils Relative %: 68 %
Platelets: 238 10*3/uL (ref 150–400)
RBC: 3.85 MIL/uL — ABNORMAL LOW (ref 4.22–5.81)
RDW: 13.6 % (ref 11.5–15.5)
WBC: 5.9 10*3/uL (ref 4.0–10.5)
nRBC: 0 % (ref 0.0–0.2)

## 2019-05-25 LAB — COMPREHENSIVE METABOLIC PANEL
ALT: 14 U/L (ref 0–44)
AST: 17 U/L (ref 15–41)
Albumin: 3.6 g/dL (ref 3.5–5.0)
Alkaline Phosphatase: 45 U/L (ref 38–126)
Anion gap: 9 (ref 5–15)
BUN: 25 mg/dL — ABNORMAL HIGH (ref 8–23)
CO2: 22 mmol/L (ref 22–32)
Calcium: 8.9 mg/dL (ref 8.9–10.3)
Chloride: 108 mmol/L (ref 98–111)
Creatinine, Ser: 1.32 mg/dL — ABNORMAL HIGH (ref 0.61–1.24)
GFR calc Af Amer: >60 mL/min (ref >60)
GFR calc non Af Amer: 54 mL/min — ABNORMAL LOW (ref >60)
Glucose, Bld: 125 mg/dL — ABNORMAL HIGH (ref 70–99)
Potassium: 3.8 mmol/L (ref 3.5–5.1)
Sodium: 139 mmol/L (ref 135–145)
Total Bilirubin: 0.6 mg/dL (ref 0.3–1.2)
Total Protein: 6.3 g/dL — ABNORMAL LOW (ref 6.5–8.1)

## 2019-05-25 LAB — GLUCOSE, CAPILLARY: Glucose-Capillary: 116 mg/dL — ABNORMAL HIGH (ref 70–99)

## 2019-05-25 MED ORDER — LIDOCAINE HCL 2 % IJ SOLN
20.0000 mL | Freq: Once | INTRAMUSCULAR | Status: AC
  Administered 2019-05-25: 400 mg
  Filled 2019-05-25: qty 20

## 2019-05-25 MED ORDER — IOHEXOL 180 MG/ML  SOLN
10.0000 mL | Freq: Once | INTRAMUSCULAR | Status: AC
  Administered 2019-05-25: 10 mL via EPIDURAL

## 2019-05-25 MED ORDER — SODIUM CHLORIDE 0.9 % IV BOLUS
1000.0000 mL | Freq: Once | INTRAVENOUS | Status: AC
  Administered 2019-05-25: 12:00:00 1000 mL via INTRAVENOUS

## 2019-05-25 MED ORDER — ROPIVACAINE HCL 2 MG/ML IJ SOLN
1.0000 mL | Freq: Once | INTRAMUSCULAR | Status: AC
  Administered 2019-05-25: 11:00:00 1 mL via EPIDURAL
  Filled 2019-05-25: qty 10

## 2019-05-25 MED ORDER — SODIUM CHLORIDE (PF) 0.9 % IJ SOLN
INTRAMUSCULAR | Status: AC
  Filled 2019-05-25: qty 10

## 2019-05-25 MED ORDER — DEXAMETHASONE SODIUM PHOSPHATE 10 MG/ML IJ SOLN
10.0000 mg | Freq: Once | INTRAMUSCULAR | Status: AC
  Administered 2019-05-25: 11:00:00 10 mg
  Filled 2019-05-25: qty 1

## 2019-05-25 MED ORDER — SODIUM CHLORIDE 0.9% FLUSH
1.0000 mL | Freq: Once | INTRAVENOUS | Status: AC
  Administered 2019-05-25: 1 mL

## 2019-05-25 NOTE — ED Notes (Addendum)
Pt refuses to wear O2 stating that it is burning his nose and he does not need it - pt had removed O2 - pt is noted to desat to 92% without O2 and he states "I do not care, I am breathing fine, CUT IT OFF" - O2 removed

## 2019-05-25 NOTE — ED Triage Notes (Signed)
Pt in pain clinic for spinal injections, had syncopal episode, code blue called. Pt arrived to room 5 responsive. MD at bedside.

## 2019-05-25 NOTE — Progress Notes (Signed)
After DC instructions were given Ptatient up to front desk. Notice d some stumbing and asked patient if I could take him down in a wheelchair. He stated "NO< I have to go [employer]. First. Ask patient if he would like something to drink. Patient stated NO". Left patient at front desk to make follow up appt.

## 2019-05-25 NOTE — ED Provider Notes (Signed)
[facility] Emergency Department Provider Note   ____________________________________________   I have reviewed the triage vital signs and the nursing notes.   HISTORY  Chief Complaint Loss of Consciousness   History limited by: Not Limited   HPI Carl [name] is a 73 y.o. male who presents to the emergency department today after being called as a CODE BLUE.  The patient was in pain clinic today.  I just received cervical injections for chronic pain.  While he was in the waiting room after his procedure he was found to be unresponsive.  Did not lose pulses or stop breathing.  Upon my arrival to the pain clinic patient was able to say his name and would follow commands.  Patient states that he had been feeling fine prior to the day stay for his chronic pain.  He states he has had pain left side of his head and neck for years.  He says that he has had injections before without any help.  He has never had a similar episode asked what happened today after his procedure.  Patient denies any chest pain or shortness of breath.   Records reviewed. Per medical record review patient has a history of chronic pain.  Past Medical History:  Diagnosis Date  . Adenoma of colon   . Alcoholism ([place])    still drinking wine as of Feb 2013  . Anxiety and depression   . Cataract   . Cervical spondylosis without myelopathy   . Cervicalgia of occipito-atlanto-axial region   . Chronic pain disorder   . Degeneration of cervical intervertebral disc   . Degeneration, intervertebral disc, cervical   . Duodenal ulcer 2011  . Facet arthropathy, cervical   . Fuchs' corneal dystrophy   . Gastric ulcer 2011   egd at [place] Aug 2011.  . GI bleed   . Gout   . Hypertension   . Impaired fasting glucose   . 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 LVH (left ventricular hypertrophy)   . Mild cognitive impairment   . Mitral insufficiency    . Neck pain   . Neuralgia and neuritis   . Nocturia   . Occipital neuralgia   . Osteoarthritis    neck  . Osteoarthritis   . Pure hypercholesterolemia   . Radiculitis   . Stroke ([place])   . Tricuspid insufficiency     Patient Active Problem List   Diagnosis Date Noted  . Acute renal failure (ARF) ([place]) 04/02/2019  . Neurogenic pain 12/21/2018  . Cervical spondylosis with radiculopathy 10/13/2018  . Melena 09/15/2018  . Acute esophagogastric ulcer 09/15/2018  . Duodenum ulcer 09/15/2018  . 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 Chronic neck pain (Primary Area of Pain) (Left) 07/09/2018  . Chronic pain syndrome 07/09/2018  . Pharmacologic therapy 07/09/2018  . Disorder of skeletal system 07/09/2018  . Problems influencing health status 07/09/2018  . Chronic facial pain (Secondary Area of Pain) (Left) 07/09/2018  . Cervicalgia (Left) 07/09/2018  . Abnormal behavior 04/01/2018  . Socially inappropriate behavior 04/01/2018  . History of adenomatous polyp of colon 03/20/2018  . History of upper gastrointestinal bleeding 03/20/2018  . GIB (gastrointestinal bleeding) 06/19/2017  . Occipital neuralgia (Left) 03/04/2017  . GI bleed 03/03/2017  . History of stroke 03/03/2017  . Right sided weakness 03/03/2017  . Slurred speech 03/03/2017  . Word finding difficulty 03/03/2017  . Opiate use 03/03/2017  . Chronic headaches 03/03/2017  .  Chronic knee pain (Bilateral) 03/03/2017  . 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 Cervical spondylosis without myelopathy 05/16/2015  . Cervicalgia of occipito-atlanto-axial region (Left) 02/09/2015  . 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 Chronic pain disorder 09/09/2013  . Neuralgia, neuritis, and radiculitis, unspecified 09/09/2013  . Cognitive disorder 06/23/2013  . Depression, major, single episode, moderate ([place])  06/23/2013  . Mild cognitive impairment 06/23/2013  . Anxiety and depression 05/05/2013  . Cervical facet arthropathy (Left) 05/05/2013  . Spondylosis without myelopathy or radiculopathy, cervical region 05/05/2013  . Nocturia 04/22/2013  . LVH (left ventricular hypertrophy) 02/19/2013  . Mitral insufficiency 02/19/2013  . Pure hypercholesterolemia 02/19/2013  . Tricuspid insufficiency 02/19/2013  . Cataracts, bilateral 08/21/2012  . Fuchs' corneal dystrophy 08/21/2012  . Alcohol dependence in remission ([place]) 03/14/2011  . DDD (degenerative disc disease), cervical 03/14/2011  . Persistent insomnia 03/14/2011  . Osteoarthritis involving multiple joints 03/14/2011  . Primary generalized (osteo)arthritis 03/14/2011  . ANXIETY, SITUATIONAL 06/15/2010  . Situational anxiety 06/15/2010  . CELLULITIS, HAND, LEFT 05/03/2010  . Cellulitis of unspecified part of limb 05/03/2010  . Unspecified contact dermatitis due to plants, except food 01/12/2010  . Sprain of joints and ligaments of unspecified parts of neck, initial encounter 01/03/2010  . LUNG NODULE 07/17/2009  . Chest pain 07/13/2009  . UNSPECIFIED PROSTATITIS 06/15/2009  . Alcohol abuse 05/15/2009  . Alcohol withdrawal ([place]) 04/24/2009  . Impaired fasting glucose 11/04/2007  . Gout 01/02/2007  . Essential hypertension 01/02/2007    Past Surgical History:  Procedure Laterality Date  . COLONOSCOPY WITH PROPOFOL N/A 06/05/2018   Procedure: COLONOSCOPY WITH PROPOFOL;  Surgeon: Manya Silvas, MD;  Location: [facility] ENDOSCOPY;  Service: Endoscopy;  Laterality: N/A;  . CYSTOGRAM  03/08  . ESOPHAGOGASTRODUODENOSCOPY (EGD) WITH PROPOFOL N/A 06/20/2017   Procedure: ESOPHAGOGASTRODUODENOSCOPY (EGD) WITH PROPOFOL;  Surgeon: Lucilla Lame, MD;  Location[facility] ENDOSCOPY;  Service: Endoscopy;  Laterality: N/A;    Prior to Admission medications   Medication Sig Start Date End Date Taking? Authorizing Provider  acetaminophen (TYLENOL) 500 MG  tablet Take 500-1,000 mg by mouth every 6 (six) hours as needed for mild pain, moderate pain or fever.     [provider]  amLODipine (NORVASC) 10 MG tablet Take 0.5 tablets (5 mg total) by mouth daily. 04/04/19   Loletha Grayer, MD  atorvastatin (LIPITOR) 10 MG tablet Take 10 mg by mouth every evening.     [provider]  carvedilol (COREG) 3.125 MG tablet Take 1 tablet (3.125 mg total) by mouth 2 (two) times daily with a meal. 04/04/19   Leslye Peer, Richard, MD  ciprofloxacin (CIPRO) 500 MG tablet Take 1 tablet (500 mg total) by mouth 2 (two) times daily. Patient not taking: Reported on 05/25/2019 04/04/19   Loletha Grayer, MD  ferrous sulfate 325 (65 FE) MG tablet Take 1 tablet (325 mg total) by mouth 2 (two) times daily with a meal. 06/22/17   Demetrios Loll, MD  gabapentin (NEURONTIN) 300 MG capsule Take 1 capsule (300 mg total) by mouth at bedtime. 04/04/19   Loletha Grayer, MD  ipratropium (ATROVENT) 0.06 % nasal spray Place 2 sprays into both nostrils 3 (three) times daily. 03/03/19   [provider]  tamsulosin (FLOMAX) 0.4 MG CAPS capsule Take 1 capsule (0.4 mg total) by mouth daily. 04/07/19   [name], MD    Allergies Ace inhibitors, Aspirin, Hydrochlorothiazide, Lisinopril, Nsaids,  and Ramipril  Family History  Problem Relation Age of Onset  . Fibromyalgia Mother   . Hypertension Father   . Diabetes Neg Hx     Social History Social History   Tobacco Use  . Smoking status: Former Smoker    Packs/day: 1.00    Years: 30.00    Pack years: 30.00    Types: Cigarettes    Quit date: 09/02/1978    Years since quitting: 40.7  . Smokeless tobacco: Never Used  Substance Use Topics  . Alcohol use: No  . Drug use: No    Review of Systems Constitutional: No fever/chills Eyes: No visual changes. ENT: No sore throat. Cardiovascular: Denies chest pain. Respiratory: Denies shortness of breath. Gastrointestinal: No abdominal pain.  No nausea, no vomiting.   No diarrhea.   Genitourinary: Negative for dysuria. Musculoskeletal: Positive for left neck pain. Skin: Negative for rash. Neurological: Positive for left sided head pain.  ____________________________________________   PHYSICAL EXAM:  VITAL SIGNS: ED Triage Vitals  Enc Vitals Group     BP 05/25/19 1202 113/75     Pulse Rate 05/25/19 1202 (!) 52     Resp 05/25/19 1202 20     Temp 05/25/19 1202 97.6 F (36.4 C)     Temp Source 05/25/19 1202 Oral     SpO2 05/25/19 1202 95 %     Weight 05/25/19 1203 195 lb (88.5 kg)     Height 05/25/19 1203 5\' 7"  (1.702 m)     Head Circumference --      Peak Flow --      Pain Score 05/25/19 1202 9   Constitutional: Awake and alert.  Eyes: Conjunctivae are normal.  ENT      Head: Normocephalic and atraumatic.      Nose: No congestion/rhinnorhea.      Mouth/Throat: Mucous membranes are moist.      Neck: No stridor. Hematological/Lymphatic/Immunilogical: No cervical lymphadenopathy. Cardiovascular: Normal rate, regular rhythm.  No murmurs, rubs, or gallops.  Respiratory: Normal respiratory effort without tachypnea nor retractions. Breath sounds are clear and equal bilaterally. No wheezes/rales/rhonchi. Gastrointestinal: Soft and non tender. No rebound. No guarding.  Genitourinary: Deferred Musculoskeletal: Normal range of motion in all extremities. No lower extremity edema. Neurologic:  Opens eyes, follows commands. Responds to name. Skin:  Skin is warm, dry and intact. No rash noted.  ____________________________________________    LABS (pertinent positives/negatives)  Trop 4 x 2 CBC wbc 5.9, hgb 11.1, plt 238 CMP na 139, k 3.8, glu 125, bun 25, cr 1.32  ____________________________________________   EKG  I, Nance Pear, attending physician, personally viewed and interpreted this EKG  EKG Time: 1200 Rate: 52 Rhythm: sinus rhythm Axis: normal Intervals: qtc 438 QRS: RBBB ST changes: no st elevation Impression: abnormal  ekg  ____________________________________________    RADIOLOGY  None  ____________________________________________   PROCEDURES  Procedures  CRITICAL CARE Performed by: Nance Pear   Total critical care time: 35 minutes  Critical care time was exclusive of separately billable procedures and treating other patients.  Critical care was necessary to treat or prevent imminent or life-threatening deterioration.  Critical care was time spent personally by me on the following activities: development of treatment plan with patient and/or surrogate as well as nursing, discussions with consultants, evaluation of patient's response to treatment, examination of patient, obtaining history from patient or surrogate, ordering and performing treatments and interventions, ordering and review of laboratory studies, ordering and review of radiographic studies, pulse oximetry and re-evaluation of patient's  condition.  ____________________________________________   INITIAL IMPRESSION / ASSESSMENT AND PLAN / ED COURSE  Pertinent labs & imaging results that were available during my care of the patient were reviewed by me and considered in my medical decision making (see chart for details).   Patient initially evaluated under CODE BLUE was called while patient was waiting in the pain clinic after cervical spine injections.  Patient never lost pulses nor did he ever stop breathing.  Upon my initial exam he was responsive and followed basic commands.  Patient quickly improved here in the emergency department.  Troponins negative x2.  Blood work without concerning electrolyte abnormality or anemia.  I did offer admission to the patient given concern for syncopal episode however he stated he would rather go home.  Patient's main complaint was for his chronic head and neck pain.  Did discuss with the patient importance of continued follow-up with pain clinic to further address  this.  ____________________________________________   FINAL CLINICAL IMPRESSION(S) / ED DIAGNOSES  Final diagnoses:  Syncope, unspecified syncope type     Note: This dictation was prepared with Dragon dictation. Any transcriptional errors that result from this process are unintentional     Nance Pear, MD 05/25/19 1558

## 2019-05-25 NOTE — Discharge Instructions (Addendum)
Please seek medical attention for any high fevers, chest pain, shortness of breath, change in behavior, persistent vomiting, bloody stool or any other new or concerning symptoms.  

## 2019-05-25 NOTE — ED Notes (Signed)
Pt alert and oriented. Steady gait. NAD noted

## 2019-05-25 NOTE — Progress Notes (Signed)
Safety precautions to be maintained throughout the outpatient stay will include: orient to surroundings, keep bed in low position, maintain call bell within reach at all times, provide assistance with transfer out of bed and ambulation.  Currently taking antibiotics for UTI. No remaining symptoms of UTI, afebrile.

## 2019-05-25 NOTE — ED Notes (Signed)
Pt given warm blankets Pt is A&O x4 at this time - remains bradycardiac

## 2019-05-25 NOTE — Patient Instructions (Addendum)
____________________________________________________________________________________________  Post-Procedure Discharge Instructions  Instructions:  Apply ice:   Purpose: This will minimize any swelling and discomfort after procedure.   When: Day of procedure, as soon as you get home.  How: Fill a plastic sandwich bag with crushed ice. Cover it with a small towel and apply to injection site.  How long: (15 min on, 15 min off) Apply for 15 minutes then remove x 15 minutes.  Repeat sequence on day of procedure, until you go to bed.  Apply heat:   Purpose: To treat any soreness and discomfort from the procedure.  When: Starting the next day after the procedure.  How: Apply heat to procedure site starting the day following the procedure.  How long: May continue to repeat daily, until discomfort goes away.  Food intake: Start with clear liquids (like water) and advance to regular food, as tolerated.   Physical activities: Keep activities to a minimum for the first 8 hours after the procedure. After that, then as tolerated.  Driving: If you have received any sedation, be responsible and do not drive. You are not allowed to drive for 24 hours after having sedation.  Blood thinner: (Applies only to those taking blood thinners) You may restart your blood thinner 6 hours after your procedure.  Insulin: (Applies only to Diabetic patients taking insulin) As soon as you can eat, you may resume your normal dosing schedule.  Infection prevention: Keep procedure site clean and dry. Shower daily and clean area with soap and water.  Post-procedure Pain Diary: Extremely important that this be done correctly and accurately. Recorded information will be used to determine the next step in treatment. For the purpose of accuracy, follow these rules:  Evaluate only the area treated. Do not report or include pain from an untreated area. For the purpose of this evaluation, ignore all other areas of pain,  except for the treated area.  After your procedure, avoid taking a long nap and attempting to complete the pain diary after you wake up. Instead, set your alarm clock to go off every hour, on the hour, for the initial 8 hours after the procedure. Document the duration of the numbing medicine, and the relief you are getting from it.  Do not go to sleep and attempt to complete it later. It will not be accurate. If you received sedation, it is likely that you were given a medication that may cause amnesia. Because of this, completing the diary at a later time may cause the information to be inaccurate. This information is needed to plan your care.  Follow-up appointment: Keep your post-procedure follow-up evaluation appointment after the procedure (usually 2 weeks for most procedures, 6 weeks for radiofrequencies). DO NOT FORGET to bring you pain diary with you.   Expect: (What should I expect to see with my procedure?)  From numbing medicine (AKA: Local Anesthetics): Numbness or decrease in pain. You may also experience some weakness, which if present, could last for the duration of the local anesthetic.  Onset: Full effect within 15 minutes of injected.  Duration: It will depend on the type of local anesthetic used. On the average, 1 to 8 hours.   From steroids (Applies only if steroids were used): Decrease in swelling or inflammation. Once inflammation is improved, relief of the pain will follow.  Onset of benefits: Depends on the amount of swelling present. The more swelling, the longer it will take for the benefits to be seen. In some cases, up to 10 days.    Duration: Steroids will stay in the system x 2 weeks. Duration of benefits will depend on multiple posibilities including persistent irritating factors.  Side-effects: If present, they may typically last 2 weeks (the duration of the steroids).  Frequent: Cramps (if they occur, drink Gatorade and take over-the-counter Magnesium 450-500 mg  once to twice a day); water retention with temporary weight gain; increases in blood sugar; decreased immune system response; increased appetite.  Occasional: Facial flushing (red, warm cheeks); mood swings; menstrual changes.  Uncommon: Long-term decrease or suppression of natural hormones; bone thinning. (These are more common with higher doses or more frequent use. This is why we prefer that our patients avoid having any injection therapies in other practices.)   Very Rare: Severe mood changes; psychosis; aseptic necrosis.  From procedure: Some discomfort is to be expected once the numbing medicine wears off. This should be minimal if ice and heat are applied as instructed.  Call if: (When should I call?)  You experience numbness and weakness that gets worse with time, as opposed to wearing off.  New onset bowel or bladder incontinence. (Applies only to procedures done in the spine)  Emergency Numbers:  Durning business hours (Monday - Thursday, 8:00 AM - 4:00 PM) (Friday, 9:00 AM - 12:00 Noon): [phone]  After hours: [phone]  NOTE: If you are having a problem and are unable connect with, or to talk to a provider, then go to your nearest urgent care or emergency department. If the problem is serious and urgent, please call 911. ____________________________________________________________________________________________   Carl [name] Carl [name]) is a calcitonin gene-related peptide receptor antagonist indicated for the acute treatment of migraine with or without aura in adults. This is not a medication that I prescribe since I do not treat Migraine headaches.

## 2019-05-25 NOTE — ED Notes (Signed)
Pt c/o left sided neck and "skull" pain - pt reports that the pain medication he was given earlier has worn off - Dr Archie Balboa made aware

## 2019-05-25 NOTE — ED Notes (Signed)
Pt assisted to use urinal.  

## 2019-05-25 NOTE — Progress Notes (Addendum)
Patient's Name: Carl [name]  MRN: [id number]  Referring Provider: Hortencia Pilar, MD  DOB: [birth year]  PCP: Hortencia Pilar, MD  DOS: 05/25/2019  Note by: Gaspar Cola, MD  Service setting: Ambulatory outpatient  Specialty: Interventional Pain Management  Patient type: Established  Location: [facility] (AMB) Pain Management Facility  Visit type: Interventional Procedure   Primary Reason for Visit: Interventional Pain Management Treatment. CC: Neck Pain (left)  Procedure:          Anesthesia, Analgesia, Anxiolysis:  Type: Therapeutic, Inter-Laminar, Cervical Epidural Steroid Injection  #2  Region: Posterior Cervico-thoracic Region Level: C7-T1 Laterality: Left-Sided Paramedial  Type: Local Anesthesia Indication(s): Analgesia         Route: Infiltration ([place]/IM) IV Access: Declined Sedation: Declined  Local Anesthetic: Lidocaine 1-2%  Position: Prone with head of the table was raised to facilitate breathing.   Indications: 1. Cervical spondylosis with radiculopathy   2. Cervicalgia (Left)   3. Cervicalgia of occipito-atlanto-axial region (Left)   4. DDD (degenerative disc disease), cervical   5. 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 Occipital neuralgia (Left)    Pain Score: Pre-procedure: 9 /10 Post-procedure: 3 /10   Pre-op Assessment:  Carl [name] is a 73 y.o. (year old), male patient, seen today for interventional treatment. He  has a past surgical history that includes Cystogram (03/08); Esophagogastroduodenoscopy (egd) with propofol (N/A, 06/20/2017); and Colonoscopy with propofol (N/A, 06/05/2018). Carl [name] has a current medication list which includes the following prescription(s): acetaminophen, amlodipine, atorvastatin, carvedilol, ferrous sulfate, gabapentin, ipratropium, tamsulosin, and ciprofloxacin. His primarily concern today is the Neck Pain (left)  The patient comes into the clinics today asking me about the medication that he saw in the TV  advertisement. UBRELVY (Ubrogepant) is a calcitonin gene-related peptide receptor antagonist indicated for the acute treatment of migraine with or without aura in adults.  I have explained to him that this is not a type of medication that I normally use since it is prescribed for the treatment of migraine headaches, which I do not treat and I do not take patients with a diagnosis of migraine headaches.  In addition to this, today he wanted me to consider starting him on some opioids.  I reminded him that we had this conversation at the very beginning and that I told him that I did not think that this would be a type of medication that I would recommend for the condition that he described when he first came in.  However, I am willing to review his case since he would seem that he has a component of his pain that may be cervicogenic in origin, which is something that we may be able to help him with.  In fact, this is the main reason why were doing today's injection.  The one concern that I have with him is that he typically would do fine after a procedure but it is only when he starts having a lot of pain that he is then concerned about getting something for that pain, and at that point he is a lot more desperate and not thinking clearly.  I am also very concerned that I have talked to him about his use of acetaminophen and he has completely disregarded my recommendations, which leads me to think that he may not be very compliant with taking medications the way that I prescribed.  Unfortunately opioids are a type of medication that if he takes at the same way that he takes the  acetaminophen, this could be very dangerous for him.  Initial Vital Signs:  Pulse/HCG Rate: 73  Temp: 98.1 F (36.7 C) Resp: 16 BP: 115/70 SpO2: 98 %  BMI: Estimated body mass index is 30.54 kg/m as calculated from the following:   Height as of this encounter: 5\' 7"  (1.702 m).   Weight as of this encounter: 195 lb (88.5 kg).  Risk  Assessment: Allergies: Reviewed. He is allergic to ace inhibitors; aspirin; hydrochlorothiazide; lisinopril; nsaids; and ramipril.  Allergy Precautions: None required Coagulopathies: Reviewed. None identified.  Blood-thinner therapy: None at this time Active Infection(s): Reviewed. None identified. Carl [name] is afebrile  Site Confirmation: Carl [name] was asked to confirm the procedure and laterality before marking the site Procedure checklist: Completed Consent: Before the procedure and under the influence of no sedative(s), amnesic(s), or anxiolytics, the patient was informed of the treatment options, risks and possible complications. To fulfill our ethical and legal obligations, as recommended by the American Medical Association's Code of Ethics, I have informed the patient of my clinical impression; the nature and purpose of the treatment or procedure; the risks, benefits, and possible complications of the intervention; the alternatives, including doing nothing; the risk(s) and benefit(s) of the alternative treatment(s) or procedure(s); and the risk(s) and benefit(s) of doing nothing. The patient was provided information about the general risks and possible complications associated with the procedure. These may include, but are not limited to: failure to achieve desired goals, infection, bleeding, organ or nerve damage, allergic reactions, paralysis, and death. In addition, the patient was informed of those risks and complications associated to Spine-related procedures, such as failure to decrease pain; infection (i.e.: Meningitis, epidural or intraspinal abscess); bleeding (i.e.: epidural hematoma, subarachnoid hemorrhage, or any other type of intraspinal or peri-dural bleeding); organ or nerve damage (i.e.: Any type of peripheral nerve, nerve root, or spinal cord injury) with subsequent damage to sensory, motor, and/or autonomic systems, resulting in permanent pain, numbness, and/or weakness of one or  several areas of the body; allergic reactions; (i.e.: anaphylactic reaction); and/or death. Furthermore, the patient was informed of those risks and complications associated with the medications. These include, but are not limited to: allergic reactions (i.e.: anaphylactic or anaphylactoid reaction(s)); adrenal axis suppression; blood sugar elevation that in diabetics may result in ketoacidosis or comma; water retention that in patients with history of congestive heart failure may result in shortness of breath, pulmonary edema, and decompensation with resultant heart failure; weight gain; swelling or edema; medication-induced neural toxicity; particulate matter embolism and blood vessel occlusion with resultant organ, and/or nervous system infarction; and/or aseptic necrosis of one or more joints. Finally, the patient was informed that Medicine is not an exact science; therefore, there is also the possibility of unforeseen or unpredictable risks and/or possible complications that may result in a catastrophic outcome. The patient indicated having understood very clearly. We have given the patient no guarantees and we have made no promises. Enough time was given to the patient to ask questions, all of which were answered to the patient's satisfaction. Mr. Berber has indicated that he wanted to continue with the procedure. Attestation: I, the ordering provider, attest that I have discussed with the patient the benefits, risks, side-effects, alternatives, likelihood of achieving goals, and potential problems during recovery for the procedure that I have provided informed consent. Date  Time: 05/25/2019 10:38 AM  Pre-Procedure Preparation:  Monitoring: As per clinic protocol. Respiration, ETCO2, SpO2, BP, heart rate and rhythm monitor placed and checked for adequate function  Safety Precautions: Patient was assessed for positional comfort and pressure points before starting the procedure. Time-out: I initiated and  conducted the "Time-out" before starting the procedure, as per protocol. The patient was asked to participate by confirming the accuracy of the "Time Out" information. Verification of the correct person, site, and procedure were performed and confirmed by me, the nursing staff, and the patient. "Time-out" conducted as per Joint Commission's Universal Protocol (UP.01.01.01). Time: 1108  Description of Procedure:          Target Area: For Epidural Steroid injections the target is the interlaminar space, initially targeting the lower border of the superior vertebral body lamina. Approach: Paramedial approach. Area Prepped: Entire PosteriorCervical Region Prepping solution: DuraPrep (Iodine Povacrylex [0.7% available iodine] and Isopropyl Alcohol, 74% w/w) Safety Precautions: Aspiration looking for blood return was conducted prior to all injections. At no point did we inject any substances, as a needle was being advanced. No attempts were made at seeking any paresthesias. Safe injection practices and needle disposal techniques used. Medications properly checked for expiration dates. SDV (single dose vial) medications used. Description of the Procedure: Protocol guidelines were followed. The procedure needle was introduced through the skin, ipsilateral to the reported pain, and advanced to the target area. Bone was contacted and the needle walked caudad, until the lamina was cleared. The epidural space was identified using "loss-of-resistance technique" with 2-3 ml of PF-NaCl (0.9% NSS), in a 5cc LOR glass syringe.  Vitals:   05/25/19 1036 05/25/19 1100 05/25/19 1110 05/25/19 1115  BP: 115/70 111/85 (!) 119/93 116/86  Pulse: 73 77 76 76  Resp: 16 18 18 16   Temp: 98.1 F (36.7 C)     TempSrc: Oral     SpO2: 98% 95% 96% 97%  Weight: 195 lb (88.5 kg)     Height: 5\' 7"  (1.702 m)       Start Time: 1108 hrs. End Time: 1114 hrs. Materials:  Needle(s) Type: Epidural needle Gauge: 17G Length:  3.5-in Medication(s): Please see orders for medications and dosing details.  Imaging Guidance (Spinal):          Type of Imaging Technique: Fluoroscopy Guidance (Spinal) Indication(s): Assistance in needle guidance and placement for procedures requiring needle placement in or near specific anatomical locations not easily accessible without such assistance. Exposure Time: Please see nurses notes. Contrast: Before injecting any contrast, we confirmed that the patient did not have an allergy to iodine, shellfish, or radiological contrast. Once satisfactory needle placement was completed at the desired level, radiological contrast was injected. Contrast injected under live fluoroscopy. No contrast complications. See chart for type and volume of contrast used.  The contrast was observed to have been in the posterior epidural space and primarily on the left lateral canal, but no contrast was observed to have gone intrathecally. Fluoroscopic Guidance: I was personally present during the use of fluoroscopy. "Tunnel Vision Technique" used to obtain the best possible view of the target area. Parallax error corrected before commencing the procedure. "Direction-depth-direction" technique used to introduce the needle under continuous pulsed fluoroscopy. Once target was reached, antero-posterior, oblique, and lateral fluoroscopic projection used confirm needle placement in all planes. Images permanently stored in EMR. Interpretation: I personally interpreted the imaging intraoperatively. Adequate needle placement confirmed in multiple planes. Appropriate spread of contrast into desired area was observed. No evidence of afferent or efferent intravascular uptake. No intrathecal or subarachnoid spread observed. Permanent images saved into the patient's record.  Antibiotic Prophylaxis:   Anti-infectives (From admission, onward)  None     Indication(s): None identified  Post-operative Assessment:  Post-procedure  Vital Signs:  Pulse/HCG Rate: 76  Temp: 98.1 F (36.7 C) Resp: 16 BP: 116/86 SpO2: 97 %  EBL: None  Complications: Although the patient had done well after the procedure and we had already discharge him, as he was leaving the clinic our staff noticed that he was stumbling and they went on to offer him a wheelchair take him back to his car.  He refused twice and then he sat down in our waiting area.  One of the nurses was called to ask him if he was doing well or if he had any kind of problems and when she attempted to engage him she noticed that he had slumped over the chair and immediately she called for help.  He was transferred to a bed where he was given assistance.  When we noticed that he was unresponsive, basic life support was immediately started and we called for a code.  In no point the patient stopped breathing and he maintain adequate palpable pulses.  However, he was not following commands.  An IV was started and vital signs were obtained blood pressure and heart rate was found to be within normal limits.  He slowly began to recover and follow-up with eye movements.  Jaw thrust was maintained to assist with his ventilation.  Once the code team arrived they assisted with the case.  He recovered to the point where he was able to follow commands and squeeze my fingers with both upper extremities on command he also was able to do plantar flexion and extension on command.  He was taken to the emergency room where further tests were conducted.  An incident report was completed due to the fact that this occurred while the patient was in our facility.  We also evaluated the patient for the possibility of problems with the cervical epidural steroid injection that had been completed, but the fact that he was able to have a normal neurological exam argues against infiltration of any of the medicine into the intrathecal space.  Since the problem seem to be more in the medical realm and concerns were  raised about the possibility of a stroke, he was sent down to the emergency department for full evaluation.  Upon follow-up with the ED department, they reported that he seemed to have recovered and he was awake, oriented, following commands, and completely recalled the event.  Once again this would argue against an event directly related to the epidural steroid injection.  Comments:  After the event, I attempted to contact the patient's wife, Ms. Dulcy Fanny at [phone], but I was unable to talk to her and I did leave a message to call us back so that we could give her some feedback.  Plan of Care  Orders:  Orders Placed This Encounter  Procedures  . Cervical Epidural Injection    Procedure: Cervical Epidural Steroid Injection/Block Purpose: Therapeutic Indication(s): Radiculitis and cervicalgia associater with cervical degenerative disc disease.    Scheduling Instructions:     Level(s): C7-T1     Laterality: Left-sided     Sedation: No Sedation.     Timeframe: Today    Order Specific Question:   Where will this procedure be performed?    Answer:   [facility]    Comments:   by Dr. Dossie Arbour  . DG PAIN [facility] C-ARM 1-60 MIN NO REPORT    Intraoperative interpretation by procedural physician at  [facility].    Standing Status:   Standing    Number of Occurrences:   1    Order Specific Question:   Reason for exam:    Answer:   Assistance in needle guidance and placement for procedures requiring needle placement in or near specific anatomical locations not easily accessible without such assistance.  . Informed Consent Details: Physician/Practitioner Attestation; Transcribe to consent form and obtain patient signature    Surgeon/Provider: Jettie Mannor A. Dossie Arbour, MD (Pain Management Specialist) Attestation: I, [place] Dossie Arbour, MD, the physician/practitioner, attest that I have discussed with the patient the benefits, risks, side effects, alternatives, likelihood of  achieving goals and potential problems during recovery for the procedure that I have provided informed consent.    Scheduling Instructions:     Procedure: Cervical Epidural Steroid Injection (CESI) under fluoroscopic guidance     Indication/Reason: Cervicalgia (Neck Pain) with or without Cervical Radiculopathy/Radiculitis (Arm/Shoulder Pain, Numbness, and/or weakness), secondary to Cervical and/or Cervicothoracic Degenerative Disc Disease (DDD), with or without Intervertebral Disc Displacement (IVDD).     Note: Always confirm laterality of pain with Mr. Bator, before procedure.   Chronic Opioid Analgesic:  None from our practice.   Medications ordered for procedure: Meds ordered this encounter  Medications  . iohexol (OMNIPAQUE) 180 MG/ML injection 10 mL    Must be Myelogram-compatible. If not available, you may substitute with a water-soluble, non-ionic, hypoallergenic, myelogram-compatible radiological contrast medium.  Marland Kitchen lidocaine (XYLOCAINE) 2 % (with pres) injection 400 mg  . sodium chloride flush (NS) 0.9 % injection 1 mL  . ropivacaine (PF) 2 mg/mL (0.2%) (NAROPIN) injection 1 mL  . dexamethasone (DECADRON) injection 10 mg   Medications administered: We administered iohexol, lidocaine, sodium chloride flush, ropivacaine (PF) 2 mg/mL (0.2%), and dexamethasone.  See the medical record for exact dosing, route, and time of administration.  Follow-up plan:   Return in about 2 weeks (around 06/08/2019) for (VV), (PP).       Interventional management options:  Considering:   Diagnostic left cervical facet block  Possible left cervical facet RFA    PRN Procedures:   Palliative/therapeutic left CESI #3     Recent Visits Date Type Provider Dept  04/08/19 Office Visit Milinda Pointer, MD [facility]  03/17/19 Office Visit Milinda Pointer, MD [facility]  Showing recent visits within past 90 days and meeting all other requirements   Today's Visits Date Type  Provider Dept  05/25/19 Procedure visit Milinda Pointer, MD [facility]  Showing today's visits and meeting all other requirements   Future Appointments Date Type Provider Dept  06/16/19 Appointment Milinda Pointer, MD [facility]  Showing future appointments within next 90 days and meeting all other requirements   Disposition: Discharge home  Discharge Date & Time: 05/25/2019; 1118 hrs.   Primary Care Physician: Hortencia Pilar, MD Location: [facility] Note by: Gaspar Cola, MD Date: 05/25/2019; Time: 11:37 AM  Disclaimer:  Medicine is not an exact science. The only guarantee in medicine is that nothing is guaranteed. It is important to note that the decision to proceed with this intervention was based on the information collected from the patient. The Data and conclusions were drawn from the patient's questionnaire, the interview, and the physical examination. Because the information was provided in large part by the patient, it cannot be guaranteed that it has not been purposely or unconsciously manipulated. Every effort has been made to obtain as much relevant data as possible  for this evaluation. It is important to note that the conclusions that lead to this procedure are derived in large part from the available data. Always take into account that the treatment will also be dependent on availability of resources and existing treatment guidelines, considered by other Pain Management Practitioners as being common knowledge and practice, at the time of the intervention. For Medico-Legal purposes, it is also important to point out that variation in procedural techniques and pharmacological choices are the acceptable norm. The indications, contraindications, technique, and results of the above procedure should only be interpreted and judged by a Board-Certified Interventional Pain Specialist with extensive familiarity and expertise in the  same exact procedure and technique.

## 2019-05-26 ENCOUNTER — Ambulatory Visit: Payer: Medicare Other | Admitting: Urology

## 2019-05-26 ENCOUNTER — Telehealth: Payer: Self-pay | Admitting: *Deleted

## 2019-05-26 NOTE — Telephone Encounter (Signed)
States "doing as well as I can be". Denies any post procedure problems.

## 2019-06-02 ENCOUNTER — Other Ambulatory Visit: Payer: Self-pay

## 2019-06-02 ENCOUNTER — Ambulatory Visit: Payer: Medicare Other | Admitting: Urology

## 2019-06-02 ENCOUNTER — Encounter: Payer: Self-pay | Admitting: Urology

## 2019-06-02 ENCOUNTER — Telehealth: Payer: Self-pay | Admitting: Pain Medicine

## 2019-06-02 VITALS — BP 116/75 | HR 109 | Ht 67.0 in | Wt 195.0 lb

## 2019-06-02 DIAGNOSIS — N35819 Other urethral stricture, male, unspecified site: Secondary | ICD-10-CM | POA: Diagnosis not present

## 2019-06-02 DIAGNOSIS — R339 Retention of urine, unspecified: Secondary | ICD-10-CM | POA: Diagnosis not present

## 2019-06-02 LAB — BLADDER SCAN AMB NON-IMAGING: Scan Result: 12

## 2019-06-02 NOTE — Telephone Encounter (Signed)
[name] came in to clinic today to say Thank you to everyone that helped him when he passed out after his procedure. He looked good and was talking good.  Was very thankful for the help and care he received.   Also wanted to let Dr. Dossie Arbour know he is still having pain. Also wanted to check and see if he is able to take Ubrelvy ? Is he a candidate for this medication?

## 2019-06-02 NOTE — Telephone Encounter (Signed)
Attempted to call, no answer. Message left instructing patient to discuss this medication with Dr.Naveira a VV follow-up appointment.

## 2019-06-02 NOTE — Progress Notes (Signed)
   06/02/2019 2:30 PM   Carl [name] March 09, 1946 [id number]  Reason for visit: History of urethral stricture, UTI, and urinary retention  HPI: I saw Mr. Cragg back in urology clinic today for follow-up of urethral stricture.  Briefly he is a comorbid 73 year old male that I originally met in the emergency room on 04/02/2019 when he was in acute urinary retention with UTI and AKI with greater than 800 cc in the bladder.  I was able to place a 16 [place] Coloplast catheter across some resistance approximately 2 cm proximal to the meatus consistent with a penile urethral stricture.  He passed a void trial a week later with a PVR of 100 mL.  He reports that a few years ago he would place dilators in his urethra during masturbation.  Since that visit, he has been doing well and reports he is voiding with a strong stream compared to before his initial Foley placement.  He denies any gross hematuria, dysuria, or UTIs.  PVR in clinic today is 12 mL.  I had a long conversation with the patient about urethral stricture and the risk of recurrence.  Warning signs would be weakening stream, dribbling, dysuria, or UTIs.  We discussed the need for close follow-up, and possible balloon dilation/DVIU in the operating room if he were to have a recurrence of his stricture.  RTC 5 months for PVR and symptom check  A total of 15 minutes were spent face-to-face with the patient, greater than 50% was spent in patient education, counseling, and coordination of care regarding urethral stricture, risk of recurrence, and need for close follow-up.  [name], [facility] [address] [place],  [address] [phone]

## 2019-06-02 NOTE — Patient Instructions (Addendum)
Return in 5 months with PVR.   Urethral Stricture  Urethral stricture is narrowing of the tube (urethra) that carries urine from the bladder out of the body. The urethra can become narrow due to scar tissue from an injury or infection. This can make it difficult to pass urine. In women, the urethra opens above the vaginal opening. In men, the urethra opens at the tip of the penis, and the urethra is much longer than it is in women. Because of the length of the male urethra, urethral stricture is much more common in men. This condition is treated with surgery. What are the causes? In both men and women, common causes of urethral stricture include:  Urinary tract infection (UTI).  Sexually transmitted infection (STI).  Use of a tube placed into the urethra to drain urine from the bladder (urinary catheter).  Urinary tract surgery. In men, common causes of urethral stricture include:  A severe injury to the pelvis.  Prostate surgery.  Injury to the penis. In many cases, the cause of urethral stricture is not known. What increases the risk? You are more likely to develop this condition if you:  Are male. Men who have had prostate surgery are at risk of developing this condition.  Use a urinary catheter.  Have had urinary tract surgery. What are the signs or symptoms? The main symptom of this condition is difficulty passing urine. This may cause decreased urine flow, dribbling, or spraying of urine. Other symptom of this condition may include:  Frequent UTIs.  Blood in the urine.  Pain when urinating.  Swelling of the penis in men.  Inability to pass urine (urinary obstruction). How is this diagnosed? This condition may be diagnosed based on:  Your medical history and a physical exam.  Urine tests to check for infection or bleeding.  X-rays.  Ultrasound.  Retrograde urethrogram. This is a type of test in which dye is injected into the urethra and then an X-ray is  taken.  Urethroscopy. This is when a thin tube with a light and camera on the end (urethroscope) is used to look at the urethra. How is this treated? This condition is treated with surgery. The type of surgery that you have depends on the severity of your condition. You may have:  Urethral dilation. In this procedure, the narrow part of the urethra is stretched open (dilated) with dilating instruments or a small balloon.  Urethrotomy. In this procedure, a urethroscope is placed into the urethra, and the narrow part of the urethra is cut open with a surgical blade inserted through the urethroscope.  Open surgery. In this procedure, an incision is made in the urethra, the narrow part is removed, and the urethra is reconstructed. Follow these instructions at home:   Take over-the-counter and prescription medicines only as told by your health care provider.  If you were prescribed an antibiotic medicine, take it as told by your health care provider. Do not stop taking the antibiotic even if you start to feel better.  Drink enough fluid to keep your urine pale yellow.  Keep all follow-up visits as told by your health care provider. This is important. Contact a health care provider if:  You have signs of a urinary tract infection, such as: ? Frequent urination or passing small amounts of urine frequently. ? Needing to urinate urgently. ? Pain or burning with urination. ? Urine that smells bad or unusual. ? Cloudy urine. ? Pain in the lower abdomen or back. ?  Trouble urinating. ? Blood in the urine. ? Vomiting or being less hungry than normal. ? Diarrhea or abdominal pain. ? Vaginal discharge, if you are male.  Your symptoms are getting worse instead of better. Get help right away if:  You cannot pass urine.  You have a fever.  You have swelling, bruising, or discoloration of your genital area. This includes the penis, scrotum, and inner thighs for men, and the outer genital  organs (vulva) and inner thighs for women.  You develop swelling in your legs.  You have difficulty breathing. Summary  Urethral stricture is narrowing of the tube (urethra) that carries urine from the bladder out of the body. The urethra can become narrow due to scar tissue from an injury or infection.  This condition can make it difficult to pass urine.  This condition is treated with surgery. The type of surgery that you have depends on the severity of your condition.  Contact a health care provider if your symptoms get worse or you have signs of a urinary tract infection. This information is not intended to replace advice given to you by your health care provider. Make sure you discuss any questions you have with your health care provider. Document Released: 09/15/2015 Document Revised: 04/01/2018 Document Reviewed: 04/01/2018 Elsevier Patient Education  2020 [employer].

## 2019-06-10 ENCOUNTER — Telehealth: Payer: Self-pay | Admitting: *Deleted

## 2019-06-10 NOTE — Telephone Encounter (Signed)
Pt was called back.He stated that he was still having pain. He has no fever, SOB, of loss of bladder. Pt will keep his appointment on 06/16/19 and pt was inform if pain gets to bad, go to the ER.

## 2019-06-15 NOTE — Progress Notes (Signed)
Pain Management Virtual Encounter Note - Virtual Visit via Telephone Telehealth (real-time audio visits between healthcare provider and patient).   Patient's Phone No. & Preferred Pharmacy:  [phone] (home); [phone] (mobile); (Preferred) [phone] No e-mail address on record  [place], [place] - [address] [place] [address] [place] [address] Phone: [phone] Fax: [phone]    Pre-screening note:  Our staff contacted Mr. Sartini and offered him an "in person", "face-to-face" appointment versus a telephone encounter. He indicated preferring the telephone encounter, at this time.   Reason for Virtual Visit: COVID-19*  Social distancing based on CDC and AMA recommendations.   I contacted [name] on 06/16/2019 via telephone.      I clearly identified myself as Gaspar Cola, MD. I verified that I was speaking with the correct person using two identifiers (Name: JANIS LOACH, and date of birth: 01-Jul-1946).  Advanced Informed Consent I sought verbal advanced consent from [name] for virtual visit interactions. I informed Mr. Dunner of possible security and privacy concerns, risks, and limitations associated with providing "not-in-person" medical evaluation and management services. I also informed Mr. Huebsch of the availability of "in-person" appointments. Finally, I informed him that there would be a charge for the virtual visit and that he could be  personally, fully or partially, financially responsible for it. Mr. Remedios expressed understanding and agreed to proceed.   Historic Elements   Mr. DONTAVION EVEN is a 73 y.o. year old, male patient evaluated today after his last encounter by our practice on 06/10/2019. Mr. Rittenhouse  has a past medical history of Adenoma of colon, Alcoholism ([place]), Anxiety and depression, Cataract, Cervical spondylosis without myelopathy, Cervicalgia of occipito-atlanto-axial region, Chronic pain disorder, Degeneration  of cervical intervertebral disc, Degeneration, intervertebral disc, cervical, Duodenal ulcer (2011), Facet arthropathy, cervical, Fuchs' corneal dystrophy, Gastric ulcer (2011), GI bleed, Gout, Hypertension, Impaired fasting glucose, Intractable 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, LVH (left ventricular hypertrophy), Mild cognitive impairment, Mitral insufficiency, Neck pain, Neuralgia and neuritis, Nocturia, Occipital neuralgia, Osteoarthritis, Osteoarthritis, Pure hypercholesterolemia, Radiculitis, Stroke ([place]), and Tricuspid insufficiency. He also  has a past surgical history that includes Cystogram (03/08); Esophagogastroduodenoscopy (egd) with propofol (N/A, 06/20/2017); and Colonoscopy with propofol (N/A, 06/05/2018). Mr. Yoshikawa has a current medication list which includes the following prescription(s): acetaminophen, amlodipine, atorvastatin, carvedilol, ferrous sulfate, and gabapentin. He  reports that he quit smoking about 40 years ago. His smoking use included cigarettes. He has a 30.00 pack-year smoking history. He has never used smokeless tobacco. He reports that he does not drink alcohol or use drugs. Mr. Pezzella is allergic to ace inhibitors; aspirin; hydrochlorothiazide; lisinopril; nsaids; and ramipril.   HPI  Today, he is being contacted for a post-procedure assessment.  On the patient's last visit to our clinics, we went ahead and did a diagnostic left-sided cervical epidural steroid injection #2 under fluoroscopic guidance, no sedation.  The purpose of this second diagnostic cervical epidural steroid injection was to test the validity of the first diagnostic injection where he indicated having attained 100% relief of the pain for the duration of the local anesthetic.  On the first diagnostic injection he did not get any long-term benefit suggesting that what her pathology we were dealing with, was not an inflammatory process.  However, when we asked him about the results  after the first injection, the patient had significant doubts in his mind as to the information that he was providing [place].  At one  point, he suggested that the relief that he thought he might have experienced had something to do with a different type of treatment that he was undergoing at the time.  The first diagnostic injection was done on 03/17/2019.  On 05/25/2019 he returned for his second diagnostic injection and during that visit, he was very adamant that he needed to be started on some type of "opioid".  At the time, I reminded him that during my initial evaluation I had indicated to him that he was not a good candidate and that at this point nothing had changed.  I also reminded him that on procedure days, will not start patients on any new medications since it can cloud the results of the diagnostic tests.  On 05/25/2019, we completed the procedure uneventfully, however soon thereafter, as he was being discharged home, he began to behave erratically.  He denied having any problems, despite the fact that multiple individuals of our clinical and nursing staff asked him if he was feeling OK. Not only did he denied feeling abnormal, but he also refused to use a wheelchair to be transported to his vehicle. Despite claiming to be doing well, our staff felt that he was not his usual self, and they called the nursing staff to come in and assess him again, before he could go home. As soon as the nurse walked out to the reception area, he collapsed into a chair, and slumped over, loosing consciousness. Because he did not respond to stimulation, a CODE was called.   Dr. Holley Raring and myself  [name] Majestic to the reception area and confirmed his loss of consciousness. We immediately moved him to a stretcher and I performed a jaw trust maneuver to keep his airway open. At no point did he stop breathing or lost pulse. Blood pressure, O2 saturation and pulse were recorded and found to be within normal limits, ruling out a vasovagal  reflex or intrathecal spread of the local anesthetic from the cervical epidural. POC dextro stick was within normal limits, ruling out hypoglycemia or hyperglycemia. At no point did he stop breathing or show any central or peripheral signs of cyanosis, suggesting that this was not a respiratory related event. At the time, our main concern and focus was turning towards the possibility of a stroke, since he already has a documented history of this. It was not long after the hospital's Code team had arrived that he began to recover and showed signs of being able to follow command. He was taken to the ED for full evaluation and follow up of his condition did not yield a clear explanation for the event. Less than an hour after the event, he was again sitting in the ED, giving the nurses a hard time and indicating that he could remember the entire event.   I'm glad he has fully recovered, but the event has triggered more questions than answers. Even though he fully recovered from the event, this has demonstrated to me that the patient is not one that we can trust to tell us the truth regarding how he is feeling or whether or not he is having any kind of problems, as we are rendering treatment. This, in addition to the fact that he has a history of alcoholism, are some of the reasons why I do not feel comfortable prescribing controlled substances to this patient.  In am also concerned that in multiple occasions he has described how he takes Tylenol and other over-the-counter medications, "like if there were  candy".  I have warned him about this practice and I am concerned that his answer remains the same "I will do what I need to do".  With this type of attitude and the misuse of over-the-counter medications, there is absolutely no way that I will be given him medications that have the potential to kill him when misused.   Today I reminded the patient that the reason why we did diagnostic cervical epidural injections  was to determine if the pain that he was experiencing was cervicogenic or central.  From the very beginning, I thought that he was having a central pain syndrome and I warned him that there was not much I could do for that and that typically that type of condition did not respond well to the use of any opioid analgesics.  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  Because of this, today I have again informed him not only that I will not be prescribing any opioid analgesics, but I not proceeding with any further injections, especially in lieu of of his sudden loss of consciousness for normal apparent reason.  Today I have recommended that he go back to his primary care physician to get a referral to a neurology practice where they can conduct further studies to determine what is going on with this patient.  Post-Procedure Evaluation  Procedure (05/25/2019): Therapeutic left-sided CESI #2 under fluoroscopic guidance, no sedation. Pre-procedure pain level:  9/10 Post-procedure: 3/10 (> 50% relief) NOTE: This procedure was done with no sedation. During the procedure itself there was absolutely no evidence of any type of complications. There were no paresthesias, no vasovagal reactions, no complaints of discomfort from the patient, no significant changes in vital signs, no dural puncture, no heme, and absolutely no other evidence of any problems with the procedure itself.  However, after the patient stood up, he seemed to be wobbly.  Vital signs were taken and found to be within normal limits.  He was asked if he was feeling well and he indicated that he was having no problems and he just wanted to go to another appointment that he apparently had with urology within our same building.  On his way out, he  again was noticed to be ambulating erratically and once again he was asked by her staff if he was feeling well. He again indicated that he was not having any problems and that he wanted to just get going.  We offered to take him down in a wheelchair and he refused.  Seeing how the patient was behaving, the staff called nursing to assess him again and he told him that he was doing "fine".  Next thing we know, he is in one of the chairs in our waiting area slumped over and unresponsive.  I was called out to assess him and due to the fact that he was unresponsive, we called code.  At no time did he stop breathing and the soonest we were able to take his vital signs, we came back normal.  We provided chin lift/jaw thrust to assist with opening the airway. We obtained blood glucose levels, which were within normal limits. The code team arrived and soon thereafter, he began to respond to questioning.  A neurological exam was completely within  normal limits with no weakness of the upper or lower extremities. The patient was then taken to the emergency room for full evaluation and no clear abnormalities were found.  He apparently went back to his usual self and eventually was discharged home.  Sedation: None.  Effectiveness during initial hour after procedure(Ultra-Short Term Relief): 100 %   Local anesthetic used: Long-acting (4-6 hours) Effectiveness: Defined as any analgesic benefit obtained secondary to the administration of local anesthetics. This carries significant diagnostic value as to the etiological location, or anatomical origin, of the pain. Duration of benefit is expected to coincide with the duration of the local anesthetic used.  Effectiveness during initial 4-6 hours after procedure(Short-Term Relief): 0 %   Long-term benefit: Defined as any relief past the pharmacologic duration of the local anesthetics.  Effectiveness past the initial 6 hours after procedure(Long-Term Relief): 0 %   Current  benefits: Defined as benefit that persist at this time.   Analgesia:  Back to baseline Function: Back to baseline ROM: Back to baseline  Pharmacotherapy Assessment  Analgesic: No opioid analgesics from our practice.   Monitoring: Pharmacotherapy: No side-effects or adverse reactions reported. [place] PMP: PDMP reviewed during this encounter.       Compliance: No problems identified. Effectiveness: Clinically acceptable. Plan: Refer to "POC".  UDS:  Summary  Date Value Ref Range Status  07/09/2018 FINAL  Final    Comment:    ==================================================================== TOX[facility]RE COMP DRUG ANALYSIS,UR ==================================================================== Test                             Result       Flag       Units Drug Present and Declared for Prescription Verification   Gabapentin                     PRESENT      EXPECTED   Acetaminophen                  PRESENT      EXPECTED   Salicylate                     PRESENT      EXPECTED   Ibuprofen                      PRESENT      EXPECTED   Clonidine                      PRESENT      EXPECTED Drug Absent but Declared for Prescription Verification   Hydrocodone                    Not Detected UNEXPECTED ng/mg creat   Duloxetine                     Not Detected UNEXPECTED   Verapamil                      Not Detected UNEXPECTED ==================================================================== Test                      Result    Flag   Units      Ref Range   Creatinine              160  mg/dL      >=20 ==================================================================== Declared Medications:  The flagging and interpretation on this report are based on the  following declared medications.  Unexpected results may arise from  inaccuracies in the declared medications.  **Note: The testing scope of this panel includes these medications:  Clonidine  Duloxetine  Gabapentin  Hydrocodone  (Hydrocodone-Acetaminophen)  Verapamil  **Note: The testing scope of this panel does not include small to  moderate amounts of these reported medications:  Acetaminophen  Acetaminophen (Excedrin)  Acetaminophen (Hydrocodone-Acetaminophen)  Aspirin (Excedrin)  Ibuprofen  **Note: The testing scope of this panel does not include following  reported medications:  Amlodipine  Amlodipine Besylate  Atorvastatin  Caffeine (Excedrin)  Carvedilol  Cyanocobalamin  Doxazosin  Hydralazine  Iron (Ferrous Sulfate)  Losartan  Pantoprazole  Polyethylene Glycol  Potassium  Vitamin D2 (Ergocalciferol) ==================================================================== For clinical consultation, please call [phone]. ====================================================================    Laboratory Chemistry Profile (12 mo)  Renal: 07/09/2018: BUN/Creatinine Ratio 21 05/25/2019: BUN 25; Creatinine, Ser 1.32  Lab Results  Component Value Date   GFRAA >60 05/25/2019   GFRNONAA 54 (L) 05/25/2019   Hepatic: 05/25/2019: Albumin 3.6 Lab Results  Component Value Date   AST 17 05/25/2019   ALT 14 05/25/2019   Other: 07/09/2018: 25-Hydroxy, Vitamin D 26; 25-Hydroxy, Vitamin D-2 7.2; 25-Hydroxy, Vitamin D-3 19; CRP 1; Sed Rate 9; Vitamin B-12 396 Note: Above Lab results reviewed.  Imaging  Last 90 days:  US Renal  Result Date: 04/02/2019 CLINICAL DATA:  Acute renal failure EXAM: RENAL / URINARY TRACT ULTRASOUND COMPLETE COMPARISON:  None. FINDINGS: Right Kidney: Renal measurements: 11.3 x 5.4 x 5.3 cm = volume: 169. ML . Echogenicity within normal limits. No mass or hydronephrosis visualized. 9 mm right upper pole cyst. Left Kidney: Renal measurements: 11.5 x 5.7 x 5.6 cm = volume: 194 mL. Echogenicity within normal limits. No mass or hydronephrosis visualized. Bladder: Bladder empty with Foley catheter. IMPRESSION: Negative renal ultrasound. Electronically Signed   By: Franchot Gallo M.D.    On: 04/02/2019 15:31   [facility] C-arm 1-60 Min No Report  Result Date: 05/25/2019 Fluoro was used, but no Radiologist interpretation will be provided. Please refer to "NOTES" tab for provider progress note.  Assessment  The primary encounter diagnosis was Cervicalgia (Left). Diagnoses of Chronic pain syndrome, Cervical facet arthropathy (Left), Chronic facial pain (Secondary Area of Pain) (Left), Adjustment disorder with mixed disturbance of emotions and conduct, Abnormal behavior, and Central pain syndrome were also pertinent to this visit.  Plan of Care  I have discontinued Theodoro A. Bojanowski's ipratropium, ciprofloxacin, and tamsulosin. I am also having him maintain his ferrous sulfate, atorvastatin, acetaminophen, amLODipine, carvedilol, and gabapentin.  Pharmacotherapy (Medications Ordered): No orders of the defined types were placed in this encounter.  Orders:  No orders of the defined types were placed in this encounter.  Follow-up plan:   No follow-ups on file.      Interventional management options:  Considering:   NOTE: NO FURTHER Procedures.    PRN Procedures:   None.    Recent Visits Date Type Provider Dept  05/25/19 Procedure visit Milinda Pointer, MD [facility]  04/08/19 Office Visit Milinda Pointer, MD [facility]  Showing recent visits within past 90 days and meeting all other requirements   Today's Visits Date Type Provider Dept  06/16/19 Office Visit Milinda Pointer, MD [facility]  Showing today's visits and meeting all other requirements   Future Appointments No visits were found  meeting these conditions.  Showing future appointments within next 90 days and meeting all other requirements   I discussed the assessment and treatment plan with the patient. The patient was provided an opportunity to ask questions and all were answered. The patient agreed with the plan and demonstrated an understanding of the  instructions.  Patient advised to call back or seek an in-person evaluation if the symptoms or condition worsens.  Total duration of non-face-to-face encounter: 30 minutes.  Note by: Gaspar Cola, MD Date: 06/16/2019; Time: 6:34 PM  Note: This dictation was prepared with Dragon dictation. Any transcriptional errors that may result from this process are unintentional.  Disclaimer:  * Given the special circumstances of the COVID-19 pandemic, the federal government has announced that the Office for Civil Rights (OCR) will exercise its enforcement discretion and will not impose penalties on physicians using telehealth in the event of noncompliance with regulatory requirements under the [place] and [place] (HIPAA) in connection with the good faith provision of telehealth during the XX123456 national public health emergency. ([place])

## 2019-06-16 ENCOUNTER — Ambulatory Visit: Payer: Medicare Other | Attending: Pain Medicine | Admitting: Pain Medicine

## 2019-06-16 ENCOUNTER — Other Ambulatory Visit: Payer: Self-pay

## 2019-06-16 DIAGNOSIS — M542 Cervicalgia: Secondary | ICD-10-CM | POA: Diagnosis not present

## 2019-06-16 DIAGNOSIS — M47812 Spondylosis without myelopathy or radiculopathy, cervical region: Secondary | ICD-10-CM

## 2019-06-16 DIAGNOSIS — G894 Chronic pain syndrome: Secondary | ICD-10-CM

## 2019-06-16 DIAGNOSIS — R4689 Other symptoms and signs involving appearance and behavior: Secondary | ICD-10-CM

## 2019-06-16 DIAGNOSIS — R519 Headache, unspecified: Secondary | ICD-10-CM | POA: Diagnosis not present

## 2019-06-16 DIAGNOSIS — F4325 Adjustment disorder with mixed disturbance of emotions and conduct: Secondary | ICD-10-CM

## 2019-06-16 DIAGNOSIS — G8929 Other chronic pain: Secondary | ICD-10-CM

## 2019-06-16 DIAGNOSIS — G890 Central pain syndrome: Secondary | ICD-10-CM

## 2019-06-29 ENCOUNTER — Other Ambulatory Visit: Payer: Self-pay | Admitting: Family Medicine

## 2019-06-29 ENCOUNTER — Ambulatory Visit
Admission: RE | Admit: 2019-06-29 | Discharge: 2019-06-29 | Disposition: A | Discharge status: Home / Self Care | Payer: Medicare Other | Source: Ambulatory Visit | Attending: Family Medicine | Admitting: Family Medicine

## 2019-06-29 ENCOUNTER — Other Ambulatory Visit: Payer: Self-pay

## 2019-06-29 ENCOUNTER — Ambulatory Visit
Admission: RE | Admit: 2019-06-29 | Discharge: 2019-06-29 | Disposition: A | Discharge status: Home / Self Care | Payer: Medicare Other | Attending: Family Medicine | Admitting: Family Medicine

## 2019-06-29 DIAGNOSIS — G8929 Other chronic pain: Secondary | ICD-10-CM

## 2019-06-29 DIAGNOSIS — M546 Pain in thoracic spine: Secondary | ICD-10-CM | POA: Diagnosis present

## 2019-06-29 DIAGNOSIS — K6289 Other specified diseases of anus and rectum: Secondary | ICD-10-CM

## 2019-06-30 ENCOUNTER — Ambulatory Visit
Admission: RE | Admit: 2019-06-30 | Discharge: 2019-06-30 | Disposition: A | Discharge status: Home / Self Care | Payer: Medicare Other | Source: Ambulatory Visit | Attending: Family Medicine | Admitting: Family Medicine

## 2019-06-30 ENCOUNTER — Other Ambulatory Visit: Payer: Self-pay

## 2019-06-30 ENCOUNTER — Other Ambulatory Visit: Payer: Self-pay | Admitting: Family Medicine

## 2019-06-30 DIAGNOSIS — M545 Low back pain, unspecified: Secondary | ICD-10-CM

## 2019-07-05 ENCOUNTER — Ambulatory Visit: Payer: Self-pay | Admitting: Urology

## 2019-08-03 ENCOUNTER — Emergency Department: Payer: Medicare Other

## 2019-08-03 ENCOUNTER — Encounter: Payer: Self-pay | Admitting: Emergency Medicine

## 2019-08-03 ENCOUNTER — Other Ambulatory Visit: Payer: Self-pay

## 2019-08-03 ENCOUNTER — Emergency Department
Admission: EM | Admit: 2019-08-03 | Discharge: 2019-08-03 | Disposition: A | Discharge status: Home / Self Care | Payer: Medicare Other | Attending: Student in an Organized Health Care Education/Training Program | Admitting: Student in an Organized Health Care Education/Training Program

## 2019-08-03 DIAGNOSIS — N401 Enlarged prostate with lower urinary tract symptoms: Secondary | ICD-10-CM | POA: Insufficient documentation

## 2019-08-03 DIAGNOSIS — Z87891 Personal history of nicotine dependence: Secondary | ICD-10-CM | POA: Diagnosis not present

## 2019-08-03 DIAGNOSIS — Z79899 Other long term (current) drug therapy: Secondary | ICD-10-CM | POA: Diagnosis not present

## 2019-08-03 DIAGNOSIS — R109 Unspecified abdominal pain: Secondary | ICD-10-CM | POA: Insufficient documentation

## 2019-08-03 DIAGNOSIS — I10 Essential (primary) hypertension: Secondary | ICD-10-CM | POA: Diagnosis not present

## 2019-08-03 DIAGNOSIS — N400 Enlarged prostate without lower urinary tract symptoms: Secondary | ICD-10-CM

## 2019-08-03 DIAGNOSIS — R300 Dysuria: Secondary | ICD-10-CM | POA: Insufficient documentation

## 2019-08-03 LAB — CBC
HCT: 36.7 % — ABNORMAL LOW (ref 39.0–52.0)
Hemoglobin: 12.6 g/dL — ABNORMAL LOW (ref 13.0–17.0)
MCH: 29 pg (ref 26.0–34.0)
MCHC: 34.3 g/dL (ref 30.0–36.0)
MCV: 84.6 fL (ref 80.0–100.0)
Platelets: 219 10*3/uL (ref 150–400)
RBC: 4.34 MIL/uL (ref 4.22–5.81)
RDW: 13.5 % (ref 11.5–15.5)
WBC: 7 10*3/uL (ref 4.0–10.5)
nRBC: 0 % (ref 0.0–0.2)

## 2019-08-03 LAB — URINALYSIS, COMPLETE (UACMP) WITH MICROSCOPIC
Bilirubin Urine: NEGATIVE
Glucose, UA: NEGATIVE mg/dL
Hgb urine dipstick: NEGATIVE
Ketones, ur: NEGATIVE mg/dL
Nitrite: NEGATIVE
Protein, ur: 30 mg/dL — AB
Specific Gravity, Urine: 1.025 (ref 1.005–1.030)
pH: 5 (ref 5.0–8.0)

## 2019-08-03 LAB — COMPREHENSIVE METABOLIC PANEL
ALT: 12 U/L (ref 0–44)
AST: 17 U/L (ref 15–41)
Albumin: 4.2 g/dL (ref 3.5–5.0)
Alkaline Phosphatase: 46 U/L (ref 38–126)
Anion gap: 10 (ref 5–15)
BUN: 25 mg/dL — ABNORMAL HIGH (ref 8–23)
CO2: 22 mmol/L (ref 22–32)
Calcium: 9.4 mg/dL (ref 8.9–10.3)
Chloride: 109 mmol/L (ref 98–111)
Creatinine, Ser: 1.25 mg/dL — ABNORMAL HIGH (ref 0.61–1.24)
GFR calc Af Amer: >60 mL/min (ref >60)
GFR calc non Af Amer: 57 mL/min — ABNORMAL LOW (ref >60)
Glucose, Bld: 123 mg/dL — ABNORMAL HIGH (ref 70–99)
Potassium: 3.3 mmol/L — ABNORMAL LOW (ref 3.5–5.1)
Sodium: 141 mmol/L (ref 135–145)
Total Bilirubin: 0.7 mg/dL (ref 0.3–1.2)
Total Protein: 7.3 g/dL (ref 6.5–8.1)

## 2019-08-03 LAB — TROPONIN I (HIGH SENSITIVITY): Troponin I (High Sensitivity): 5 ng/L (ref <18)

## 2019-08-03 MED ORDER — LEVOFLOXACIN 250 MG PO TABS: 500.0000 mg | ORAL_TABLET | Freq: Every day | ORAL | 0 refills | Status: DC

## 2019-08-03 MED ORDER — BUTALBITAL-APAP-CAFFEINE 50-325-40 MG PO TABS
1.0000 | ORAL_TABLET | Freq: Four times a day (QID) | ORAL | Status: DC | PRN
  Administered 2019-08-03: 1 via ORAL
  Filled 2019-08-03: qty 1

## 2019-08-03 NOTE — ED Triage Notes (Signed)
Pt states that he has been having pain from his penis to his head for the last 5-6 years that keeps getting worse. Pt reports his MD advised him to come here. Pt reports pain is relieved from pain meds but then it comes back if the meds wear off. Pt is requesting a CT scan over his whole body.

## 2019-08-03 NOTE — ED Notes (Addendum)
Pt reports to ED for pain that radiates from the top of his neck to the lower back for the past 6 months that is worsening overtime. States that his doctor told him to come to the ED for a CT scan of his chest and abdomen. States he also experience pain in the back of his head that radiates to the front of his head. States that his doctor said they were cluster headaches, but are worsening as well. Pt also has a cough but states that is a chronic cough from being a former smoker.

## 2019-08-03 NOTE — ED Provider Notes (Signed)
[facility] Emergency Department Provider Note    First MD Initiated Contact with Patient 08/03/19 1629     (approximate)  I have reviewed the triage vital signs and the nursing notes.   HISTORY  Chief Complaint Abdominal Pain and Chest Pain    HPI Carl [name] is a 73 y.o. male below listed past medical history presents to the ER for over 5 years of left-sided headache as well as chronic abdominal pain now developing some back and flank pain with some dysuria.  No measured fevers.  States he talked to his PCP who told him to come to the ER to get imaging is as the only way he can get it done.  Patient denies any new features of this headache.  States that it is daily and has been ongoing for 5 years.  She denies any chest pain.  No shortness of breath.  No measured fevers.  States that he is not getting much relief with Tylenol or over-the-counter medications.  He does follow-up in chronic pain clinic.  Is any nausea vomiting or diarrhea.  No blurry vision.  No numbness or tingling.    Past Medical History:  Diagnosis Date  . Adenoma of colon   . Alcoholism ([place])    still drinking wine as of Feb 2013  . Anxiety and depression   . Cataract   . Cervical spondylosis without myelopathy   . Cervicalgia of occipito-atlanto-axial region   . Chronic pain disorder   . Degeneration of cervical intervertebral disc   . Degeneration, intervertebral disc, cervical   . Duodenal ulcer 2011  . Facet arthropathy, cervical   . Fuchs' corneal dystrophy   . Gastric ulcer 2011   egd at [place] Aug 2011.  . GI bleed   . Gout   . Hypertension   . Impaired fasting glucose   . 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 LVH (left ventricular hypertrophy)   . Mild cognitive impairment   . Mitral insufficiency   . Neck pain   . Neuralgia and neuritis   . Nocturia   . Occipital neuralgia   . Osteoarthritis    neck  .  Osteoarthritis   . Pure hypercholesterolemia   . Radiculitis   . Stroke ([place])   . Tricuspid insufficiency    Family History  Problem Relation Age of Onset  . Fibromyalgia Mother   . Hypertension Father   . Diabetes Neg Hx    Past Surgical History:  Procedure Laterality Date  . COLONOSCOPY WITH PROPOFOL N/A 06/05/2018   Procedure: COLONOSCOPY WITH PROPOFOL;  Surgeon: Manya Silvas, MD;  Location: [facility] ENDOSCOPY;  Service: Endoscopy;  Laterality: N/A;  . CYSTOGRAM  03/08  . ESOPHAGOGASTRODUODENOSCOPY (EGD) WITH PROPOFOL N/A 06/20/2017   Procedure: ESOPHAGOGASTRODUODENOSCOPY (EGD) WITH PROPOFOL;  Surgeon: Lucilla Lame, MD;  Location: [facility] ENDOSCOPY;  Service: Endoscopy;  Laterality: N/A;   Patient Active Problem List   Diagnosis Date Noted  . Acute renal failure (ARF) ([place]) 04/02/2019  . Neurogenic pain 12/21/2018  . Cervical spondylosis with radiculopathy 10/13/2018  . Melena 09/15/2018  . Acute esophagogastric ulcer 09/15/2018  . Duodenum ulcer 09/15/2018  . 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 Chronic neck pain (Primary Area of Pain) (Left) 07/09/2018  . Chronic pain syndrome 07/09/2018  . Pharmacologic therapy 07/09/2018  . Disorder of skeletal system 07/09/2018  . Problems influencing health status 07/09/2018  . Chronic facial pain (Secondary Area of Pain) (  Left) 07/09/2018  . Cervicalgia (Left) 07/09/2018  . Abnormal behavior 04/01/2018  . Socially inappropriate behavior 04/01/2018  . History of adenomatous polyp of colon 03/20/2018  . History of upper gastrointestinal bleeding 03/20/2018  . GIB (gastrointestinal bleeding) 06/19/2017  . Occipital neuralgia (Left) 03/04/2017  . GI bleed 03/03/2017  . History of stroke 03/03/2017  . Right sided weakness 03/03/2017  . Slurred speech 03/03/2017  . Word finding difficulty 03/03/2017  . Opiate use 03/03/2017  . Chronic headaches 03/03/2017  . Chronic knee pain (Bilateral) 03/03/2017  . 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 Cervical spondylosis without myelopathy 05/16/2015  . Cervicalgia of occipito-atlanto-axial region (Left) 02/09/2015  . 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 Chronic pain disorder 09/09/2013  . Neuralgia, neuritis, and radiculitis, unspecified 09/09/2013  . Cognitive disorder 06/23/2013  . Depression, major, single episode, moderate ([place]) 06/23/2013  . Mild cognitive impairment 06/23/2013  . Anxiety and depression 05/05/2013  . Cervical facet arthropathy (Left) 05/05/2013  . Spondylosis without myelopathy or radiculopathy, cervical region 05/05/2013  . Nocturia 04/22/2013  . LVH (left ventricular hypertrophy) 02/19/2013  . Mitral insufficiency 02/19/2013  . Pure hypercholesterolemia 02/19/2013  . Tricuspid insufficiency 02/19/2013  . Cataracts, bilateral 08/21/2012  . Fuchs' corneal dystrophy 08/21/2012  . Alcohol dependence in remission ([place]) 03/14/2011  . DDD (degenerative disc disease), cervical 03/14/2011  . Persistent insomnia 03/14/2011  . Osteoarthritis involving multiple joints 03/14/2011  . Primary generalized (osteo)arthritis 03/14/2011  . ANXIETY, SITUATIONAL 06/15/2010  . Situational anxiety 06/15/2010  . CELLULITIS, HAND, LEFT 05/03/2010  . Cellulitis of unspecified part of limb 05/03/2010  . Unspecified contact dermatitis due to plants, except food 01/12/2010  . Sprain of joints and ligaments of unspecified parts of neck, initial encounter 01/03/2010  . LUNG NODULE 07/17/2009  . Chest pain 07/13/2009  . UNSPECIFIED PROSTATITIS 06/15/2009  . Alcohol abuse 05/15/2009  . Alcohol withdrawal ([place]) 04/24/2009  . Impaired fasting glucose 11/04/2007  . Gout 01/02/2007  . Essential hypertension 01/02/2007      Prior to Admission medications   Medication Sig Start Date End Date Taking? Authorizing Provider  acetaminophen  (TYLENOL) 500 MG tablet Take 500-1,000 mg by mouth every 6 (six) hours as needed for mild pain, moderate pain or fever.     [provider]  amLODipine (NORVASC) 10 MG tablet Take 0.5 tablets (5 mg total) by mouth daily. 04/04/19   Loletha Grayer, MD  atorvastatin (LIPITOR) 10 MG tablet Take 10 mg by mouth every evening.     [provider]  carvedilol (COREG) 3.125 MG tablet Take 1 tablet (3.125 mg total) by mouth 2 (two) times daily with a meal. Patient taking differently: Take 6.125 mg by mouth 2 (two) times daily with a meal.  04/04/19   Leslye Peer, Richard, MD  ferrous sulfate 325 (65 FE) MG tablet Take 1 tablet (325 mg total) by mouth 2 (two) times daily with a meal. Patient taking differently: Take 325 mg by mouth daily.  06/22/17   Demetrios Loll, MD  gabapentin (NEURONTIN) 300 MG capsule Take 1 capsule (300 mg total) by mouth at bedtime. 04/04/19   Loletha Grayer, MD  levofloxacin (LEVAQUIN) 250 MG tablet Take 2 tablets (500 mg total) by mouth daily for 12 days. 08/03/19 08/15/19  Merlyn Lot, MD    Allergies Ace inhibitors, Aspirin, Hydrochlorothiazide, Lisinopril, Nsaids, and Ramipril    Social History Social History  Tobacco Use  . Smoking status: Former Smoker    Packs/day: 1.00    Years: 30.00    Pack years: 30.00    Types: Cigarettes    Quit date: 09/02/1978    Years since quitting: 40.9  . Smokeless tobacco: Never Used  Substance Use Topics  . Alcohol use: No  . Drug use: No    Review of Systems Patient denies headaches, rhinorrhea, blurry vision, numbness, shortness of breath, chest pain, edema, cough, abdominal pain, nausea, vomiting, diarrhea, dysuria, fevers, rashes or hallucinations unless otherwise stated above in HPI. ____________________________________________   PHYSICAL EXAM:  VITAL SIGNS: Vitals:   08/03/19 1256 08/03/19 1839  BP:  (!) 146/101  Pulse:  82  Resp:  16  Temp: 98.4 F (36.9 C) 98.2 F (36.8 C)  SpO2:  98%     Constitutional: Alert and oriented.  Eyes: Conjunctivae are normal.  Head: Atraumatic. Nose: No congestion/rhinnorhea. Mouth/Throat: Mucous membranes are moist.   Neck: No stridor. Painless ROM.  Cardiovascular: Normal rate, regular rhythm. Grossly normal heart sounds.  Good peripheral circulation. Respiratory: Normal respiratory effort.  No retractions. Lungs CTAB. Gastrointestinal: Soft and nontender. No distention. No abdominal bruits. No CVA tenderness. Genitourinary:  Musculoskeletal: No lower extremity tenderness nor edema.  No joint effusions. Neurologic:  Normal speech and language. No gross focal neurologic deficits are appreciated. No facial droop Skin:  Skin is warm, dry and intact. No rash noted. Psychiatric: Mood and affect are normal. Speech and behavior are normal.  ____________________________________________   LABS (all labs ordered are listed, but only abnormal results are displayed)  Results for orders placed or performed during the hospital encounter of 08/03/19 (from the past 24 hour(s))  CBC     Status: Abnormal   Collection Time: 08/03/19 12:57 PM  Result Value Ref Range   WBC 7.0 4.0 - 10.5 K/uL   RBC 4.34 4.22 - 5.81 MIL/uL   Hemoglobin 12.6 (L) 13.0 - 17.0 g/dL   HCT 36.7 (L) 39.0 - 52.0 %   MCV 84.6 80.0 - 100.0 fL   MCH 29.0 26.0 - 34.0 pg   MCHC 34.3 30.0 - 36.0 g/dL   RDW 13.5 11.5 - 15.5 %   Platelets 219 150 - 400 K/uL   nRBC 0.0 0.0 - 0.2 %  Troponin I (High Sensitivity)     Status: None   Collection Time: 08/03/19 12:57 PM  Result Value Ref Range   Troponin I (High Sensitivity) 5 <18 ng/L  Comprehensive metabolic panel     Status: Abnormal   Collection Time: 08/03/19 12:57 PM  Result Value Ref Range   Sodium 141 135 - 145 mmol/L   Potassium 3.3 (L) 3.5 - 5.1 mmol/L   Chloride 109 98 - 111 mmol/L   CO2 22 22 - 32 mmol/L   Glucose, Bld 123 (H) 70 - 99 mg/dL   BUN 25 (H) 8 - 23 mg/dL   Creatinine, Ser 1.25 (H) 0.61 - 1.24 mg/dL   Calcium  9.4 8.9 - 10.3 mg/dL   Total Protein 7.3 6.5 - 8.1 g/dL   Albumin 4.2 3.5 - 5.0 g/dL   AST 17 15 - 41 U/L   ALT 12 0 - 44 U/L   Alkaline Phosphatase 46 38 - 126 U/L   Total Bilirubin 0.7 0.3 - 1.2 mg/dL   GFR calc non Af Amer 57 (L) >60 mL/min   GFR calc Af Amer >60 >60 mL/min   Anion gap 10 5 - 15  Urinalysis, Complete  w Microscopic     Status: Abnormal   Collection Time: 08/03/19  5:40 PM  Result Value Ref Range   Color, Urine YELLOW (A) YELLOW   APPearance HAZY (A) CLEAR   Specific Gravity, Urine 1.025 1.005 - 1.030   pH 5.0 5.0 - 8.0   Glucose, UA NEGATIVE NEGATIVE mg/dL   Hgb urine dipstick NEGATIVE NEGATIVE   Bilirubin Urine NEGATIVE NEGATIVE   Ketones, ur NEGATIVE NEGATIVE mg/dL   Protein, ur 30 (A) NEGATIVE mg/dL   Nitrite NEGATIVE NEGATIVE   Leukocytes,Ua SMALL (A) NEGATIVE   RBC / HPF 6-10 0 - 5 RBC/hpf   WBC, UA 21-50 0 - 5 WBC/hpf   Bacteria, UA RARE (A) NONE SEEN   Squamous Epithelial / LPF 6-10 0 - 5   Mucus PRESENT    Hyaline Casts, UA PRESENT    ____________________________________________  EKG My review and personal interpretation at Time: 12:57   Indication: flank pain  Rate: 70  Rhythm: sinus Axis: normal Other: non specific st abn, no stemi ____________________________________________  RADIOLOGY  I personally reviewed all radiographic images ordered to evaluate for the above acute complaints and reviewed radiology reports and findings.  These findings were personally discussed with the patient.  Please see medical record for radiology report.  ____________________________________________   PROCEDURES  Procedure(s) performed:  Procedures    Critical Care performed: no ____________________________________________   INITIAL IMPRESSION / ASSESSMENT AND PLAN / ED COURSE  Pertinent labs & imaging results that were available during my care of the patient were reviewed by me and considered in my medical decision making (see chart for details).    DDX: tension, cluster, migraine, stone, AAA, colitis, pyelo, cystitis  Carl [name] is a 73 y.o. who presents to the ED with symptoms as described above.  Symptoms seem to be more chronic in nature.  As he was sent over for imaging particular in the setting of new flank pain will order CT renal has may be related to kidney stone.  He is neuro intact.  Headache has been ongoing and unchanged for 5 years.  Do not feel that he requires urgent neuroimaging given lack of deficits.  Is not consistent with SAH, IPH, CVA.  Globes feels soft and not consistent with acute glaucoma.  Abdominal exam is soft and benign.  No pain ripping or tearing through his back.  Is not consistent with dissection.  Not consistent with ACS.  Clinical Course as of Aug 03 1903  Tue Aug 03, 2019  1830 Prostate exam without any significant bogginess or pain.  Urine without any clear infection.  Discussed case with Dr. Duanne Limerick of urology who agrees with plan for culture antibiotics and close outpatient follow-up.  Do not see any indication for hospitalization at this time.  Have discussed with the patient and available family all diagnostics and treatments performed thus far and all questions were answered to the best of my ability. The patient demonstrates understanding and agreement with plan.    [PR]    Clinical Course User Index [PR] Merlyn Lot, MD    The patient was evaluated in Emergency Department today for the symptoms described in the history of present illness. He/she was evaluated in the context of the global COVID-19 pandemic, which necessitated consideration that the patient might be at risk for infection with the SARS-CoV-2 virus that causes COVID-19. Institutional protocols and algorithms that pertain to the evaluation of patients at risk for COVID-19 are in a state of rapid change based  on information released by regulatory bodies including the CDC and federal and state organizations. These policies and  algorithms were followed during the patient's care in the ED.  As part of my medical decision making, I reviewed the following data within the [facility] notes reviewed and incorporated, Labs reviewed, notes from prior ED visits and [place] Controlled Substance Database   ____________________________________________   FINAL CLINICAL IMPRESSION(S) / ED DIAGNOSES  Final diagnoses:  Dysuria  Enlarged prostate      NEW MEDICATIONS STARTED DURING THIS VISIT:  Discharge Medication List as of 08/03/2019  6:34 PM       Note:  This document was prepared using Dragon voice recognition software and may include unintentional dictation errors.    Merlyn Lot, MD 08/03/19 1904

## 2019-08-05 ENCOUNTER — Ambulatory Visit: Payer: Medicare Other | Admitting: Urology

## 2019-08-05 ENCOUNTER — Other Ambulatory Visit: Payer: Self-pay

## 2019-08-05 ENCOUNTER — Other Ambulatory Visit: Payer: Self-pay | Admitting: Urology

## 2019-08-05 ENCOUNTER — Encounter: Payer: Self-pay | Admitting: Urology

## 2019-08-05 VITALS — BP 134/84 | HR 82 | Ht 68.0 in | Wt 192.0 lb

## 2019-08-05 DIAGNOSIS — R339 Retention of urine, unspecified: Secondary | ICD-10-CM

## 2019-08-05 DIAGNOSIS — N35819 Other urethral stricture, male, unspecified site: Secondary | ICD-10-CM

## 2019-08-05 DIAGNOSIS — N390 Urinary tract infection, site not specified: Secondary | ICD-10-CM | POA: Diagnosis not present

## 2019-08-05 LAB — URINE CULTURE

## 2019-08-05 LAB — BLADDER SCAN AMB NON-IMAGING: Scan Result: 26

## 2019-08-05 MED ORDER — SULFAMETHOXAZOLE-TRIMETHOPRIM 800-160 MG PO TABS: 1.0000 | ORAL_TABLET | Freq: Two times a day (BID) | ORAL | 0 refills | Status: DC

## 2019-08-05 NOTE — Progress Notes (Signed)
08/05/2019 3:01 PM   Carl [name] 09-Feb-1946 [id number]  Reason for visit: Follow up history of urethral stricture  HPI: I saw Carl [name] in urology clinic today for ER follow-up.  I originally met him in the ER on 04/02/2019 when he presented with recurrent UTIs and urinary retention with bladder scan over 800 cc in the ED.  I was able to pass a 22 [place] Coloplast catheter and dilate a distal urethral stricture and passed the catheter into the bladder.  He passed a voiding trial a week later with PVR less than 50 mL.  He reports he has been voiding well and urinates 3-4 times during the day, and 2-3 times overnight.  He denies any significant dysuria or urinary complaints.  He was recently seen in the ED on 08/03/2019 with severe headaches and neck pain that he describes as radiating down his chest into his bellybutton.  A CT was performed and showed a possible right-sided prostatic cyst but was otherwise benign aside from changes of potential chronic pancreatitis.  Urinalysis at that time showed rare bacteria, 20-50 WBCs, 6-10 RBCs, 6-10 squamous cells, and small leukocytes.  Urine culture showed multiple species but was not speciated.  He was felt to have prostatitis and prescribed antibiotics by the emergency department, however he never filled this medication.  Urinalysis today shows 6-10 WBCs, 0 RBCs, few bacteria, no yeast, nitrite negative, trace leukocytes.  PVR in clinic today 25 mL.  Last PSA was normal at 2.8 in August 2018, and he denies any family history of prostate cancer.  ROS: Please see flowsheet from today's date for complete review of systems.  Physical Exam: BP 134/84   Pulse 82   Ht '5\' 8"'$  (1.727 m)   Wt 192 lb (87.1 kg)   BMI 29.19 kg/m    Constitutional:  Alert and oriented, No acute distress. Respiratory: Normal respiratory effort, no increased work of breathing. GI: Abdomen is soft, nontender, nondistended, no abdominal masses GU: No CVA tenderness, phallus  without lesions,widely patent meatus DRE: 50 g, smooth, prominent sulcus, no distinct masses, tender throughout Skin: No rashes, bruises or suspicious lesions. Neurologic: Grossly intact, no focal deficits, moving all 4 extremities. Psychiatric: Normal mood and affect  Laboratory Data: Reviewed  Pertinent Imaging: Reviewed, see HPI  Assessment & Plan:   In summary, the patient is a 73 year old male with history of chronic pain, cluster headaches, and neck/chest/and abdominal pain.  From a urology perspective, he does have a history of a distal urethral stricture that was dilated with a Coloplast catheter in the ER in July 2020.  He continues to empty his bladder well with low PVR today.  He has some very mild dysuria and really minimal urinary symptoms today, and I stressed at length to him that I do not think his CT findings are related to his headaches, neck, and chest pain. His CT findings and mild dysuria may be related to prostatitis.  We discussed the risks and benefits of PSA screening, and the AUA guidelines that do not recommend routine screening in men over age 59.  With his CT findings, I recommended treating him for possible prostatitis, and following up for repeat PSA in 6 to 8 weeks.  3 week course of Bactrim DS BID for possible prostatitis, RTC with PSA in 6-8 weeks  A total of 25 minutes were spent face-to-face with the patient, greater than 50% was spent in patient education, counseling, and coordination of care regarding urinary symptoms, history of  urethral stricture, and CT findings.  [name], [facility] [address], [place], [place] [address] [phone]

## 2019-08-05 NOTE — Patient Instructions (Signed)

## 2019-08-06 LAB — URINALYSIS, COMPLETE
Bilirubin, UA: NEGATIVE
Glucose, UA: NEGATIVE
Ketones, UA: NEGATIVE
Nitrite, UA: NEGATIVE
RBC, UA: NEGATIVE
Specific Gravity, UA: 1.025 (ref 1.005–1.030)
Urobilinogen, Ur: 0.2 mg/dL (ref 0.2–1.0)
pH, UA: 6 (ref 5.0–7.5)

## 2019-08-06 LAB — MICROSCOPIC EXAMINATION: RBC, Urine: NONE SEEN /hpf (ref 0–2)

## 2019-08-08 LAB — CULTURE, URINE COMPREHENSIVE

## 2019-09-16 ENCOUNTER — Ambulatory Visit: Payer: Medicare Other | Admitting: Urology

## 2019-10-28 ENCOUNTER — Ambulatory Visit: Payer: Medicare Other | Admitting: Urology

## 2019-11-01 ENCOUNTER — Ambulatory Visit: Payer: Medicare Other | Admitting: Physical Therapy

## 2019-11-03 ENCOUNTER — Ambulatory Visit: Payer: Medicare Other | Admitting: Physical Therapy

## 2019-11-08 ENCOUNTER — Ambulatory Visit: Payer: Medicare Other | Admitting: Physical Therapy

## 2019-11-10 ENCOUNTER — Ambulatory Visit: Payer: Medicare Other | Admitting: Physical Therapy

## 2019-11-11 ENCOUNTER — Ambulatory Visit: Payer: Medicare Other | Admitting: Physical Therapy

## 2019-11-15 ENCOUNTER — Ambulatory Visit: Payer: Medicare Other | Admitting: Physical Therapy

## 2019-11-16 ENCOUNTER — Other Ambulatory Visit: Payer: Self-pay

## 2019-11-16 ENCOUNTER — Ambulatory Visit: Payer: Medicare Other | Attending: Neurology | Admitting: Physical Therapy

## 2019-11-16 DIAGNOSIS — R293 Abnormal posture: Secondary | ICD-10-CM | POA: Diagnosis present

## 2019-11-16 DIAGNOSIS — M542 Cervicalgia: Secondary | ICD-10-CM | POA: Insufficient documentation

## 2019-11-16 NOTE — Therapy (Signed)
[place] MAIN [facility] SERVICES [address] [place], [place], [address] Phone: [phone]   Fax:  [phone]  Physical Therapy Evaluation  Patient Details  Name: Carl [name] MRN: [id number] Date of Birth: 05-27-1946 Referring Provider (PT): Dr. Manuella Ghazi   Encounter Date: 11/16/2019  PT End of Session - 11/16/19 1211    Visit Number  1    Number of Visits  9    Date for PT Re-Evaluation  12/14/19    Authorization Type  medicare    PT Start Time  1100    PT Stop Time  1200    PT Time Calculation (min)  60 min    Activity Tolerance  Patient tolerated treatment well    Behavior During Therapy  [facility] for tasks assessed/performed       Past Medical History:  Diagnosis Date  . Adenoma of colon   . Alcoholism ([place])    still drinking wine as of Feb 2013  . Anxiety and depression   . Cataract   . Cervical spondylosis without myelopathy   . Cervicalgia of occipito-atlanto-axial region   . Chronic pain disorder   . Degeneration of cervical intervertebral disc   . Degeneration, intervertebral disc, cervical   . Duodenal ulcer 2011  . Facet arthropathy, cervical   . Fuchs' corneal dystrophy   . Gastric ulcer 2011   egd at [place] Aug 2011.  . GI bleed   . Gout   . Hypertension   . Impaired fasting glucose   . 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 LVH (left ventricular hypertrophy)   . Mild cognitive impairment   . Mitral insufficiency   . Neck pain   . Neuralgia and neuritis   . Nocturia   . Occipital neuralgia   . Osteoarthritis    neck  . Osteoarthritis   . Pure hypercholesterolemia   . Radiculitis   . Stroke ([place])   . Tricuspid insufficiency     Past Surgical History:  Procedure Laterality Date  . COLONOSCOPY WITH PROPOFOL N/A 06/05/2018   Procedure: COLONOSCOPY WITH PROPOFOL;  Surgeon: Manya Silvas, MD;  Location: [facility] ENDOSCOPY;  Service: Endoscopy;  Laterality: N/A;   . CYSTOGRAM  03/08  . ESOPHAGOGASTRODUODENOSCOPY (EGD) WITH PROPOFOL N/A 06/20/2017   Procedure: ESOPHAGOGASTRODUODENOSCOPY (EGD) WITH PROPOFOL;  Surgeon: Lucilla Lame, MD;  Location: [facility] ENDOSCOPY;  Service: Endoscopy;  Laterality: N/A;    There were no vitals filed for this visit.   Subjective Assessment - 11/16/19 1046    Subjective  My head has been hurting ever since 2015 and it keeps me from being able to concentrate or think clearly;    Pertinent History  74 yo male reports chronic neck/headache on left side since 2014-2015. He reports having good strength in BUE but states that the pain is so severe that it does interfere with cognition and concentration. He reports pain is constant. He has tried multiple conservative treatment including medications, chiropractor (only temporary relief, stopped in Sprin 2020), steroid injections with no relief. Dr. Manuella Ghazi recently prescribed verampamil which he states has helped some. He is still requiring over the counter medication for pain control.  PMH significant: CVA Feb 2013, some vision loss in right eye from retina bleed in 1990's, HTN (controlled), OA, cervical spondylosis without radiculopathy, former smoker (quit in 1990's)    Limitations  Other (comment);Reading;Writing   yard work   How long can you sit comfortably?  NA  How long can you stand comfortably?  NA    How long can you walk comfortably?  NA    Diagnostic tests  X-ray in March 2020- multiple level cervical spondylosis with loss of disc height especially C3-C4;    Patient Stated Goals  "get rid of pain so that I can do what I would like to do."    Currently in Pain?  Yes    Pain Score  6     Pain Location  Head    Pain Orientation  Left;Posterior;Lateral    Pain Descriptors / Indicators  Headache    Pain Type  Chronic pain    Pain Radiating Towards  neck pain radiates into left posterior/lateral head;    Pain Onset  More than a month ago    Pain Frequency  Constant     Aggravating Factors   worse without medication, worse 8/10; unsure if any particular position or activity contributes to pain;    Pain Relieving Factors  medication helps reduce to 3-4/10; no relief from hot showers;    Effect of Pain on Daily Activities  decreased activity tolerance;    Multiple Pain Sites  No         [facility] PT Assessment - 11/16/19 0001      Assessment   Medical Diagnosis  Occipital neuralgia    Referring Provider (PT)  Dr. Manuella Ghazi    Onset Date/Surgical Date  09/02/13    Hand Dominance  Right    Next MD Visit  none scheduled    Prior Therapy  had PT following CVA in 2013 with good results      Precautions   Precautions  None    Required Braces or Orthoses  --   none     Restrictions   Weight Bearing Restrictions  No      Balance Screen   Has the patient fallen in the past 6 months  No    Has the patient had a decrease in activity level because of a fear of falling?   No    Is the patient reluctant to leave their home because of a fear of falling?   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 it because of pain; still driving, mod I for self care ADLs, wife does help with finances and paperwork due to decreased cognition/difficulty thinking      Prior Function   Level of [place]  Retired    Leisure  would like to do activities in [employer] copy;       Cognition   Overall Cognitive Status  History of cognitive impairments - at baseline    Memory  --    Problem Solving  --    Behaviors  Other (comment)   often distracted and requires re-direction     Observation/Other Assessments   Observations  nice gentleman, sits with slumped posture with head forward and flexed, often re-adjusts due to pain;     Focus on Therapeutic Outcomes (FOTO)   50/100      Sensation   Light Touch  Appears Intact       Coordination   Gross Motor Movements are Fluid and Coordinated  Yes      Posture/Postural Control   Posture Comments  sits with flexed head  posture and slumped positioning; able to self correct partially but continues to have moderate forward head      ROM / Strength   AROM / PROM / Strength  AROM;Strength      AROM   Overall AROM Comments  BUE and BLE are WFL; cervical AROM is [facility] with no increase in pain reported;       Strength   Overall Strength Comments  BUE are WNL      Palpation   Spinal mobility  hypomobility noted in cervical spine with central PA mobs; unable to repropduce or exacerbate neuralgia;     Palpation comment  mild tightness noted in cervical paraspinals, no tenderness reported;       Special Tests    Special Tests  Cervical    Cervical Tests  Spurling's;Dictraction      Spurling's   Comment  reports increased pain with pressure in flexed rotated right and extended rotated right;       Distraction Test   Comment  reports increased discomfort with gentle cervical distraction   no relief with suboccipital relief     Transfers   Comments  able to transfer independently      Ambulation/Gait   Gait Comments  ambulates without AD, slightly slower gait speed, reciprocal pattern, no sway, forward flexed head      High Level Balance   High Level Balance Comments  static and dynamic standing balance are good                Objective measurements completed on examination: See above findings.              PT Education - 11/16/19 1211    Education Details  recommendation    Person(s) Educated  Patient    Methods  Explanation    Comprehension  Verbalized understanding       PT Short Term Goals - 11/16/19 1219      PT SHORT TERM GOAL #1   Title  Patient will be adherent to HEP at least 3x a week to improve functional strength and balance for better safety at home.    Time  2    Period  Weeks    Status  New    Target Date  11/30/19         PT Long Term Goals - 11/16/19 1219      PT LONG TERM GOAL #1   Title  Patient will exhibit improved postural support with erect head in sitting/standing at least 50+ % of time to improve safety awareness with activities and reduce neck discomfort    Time  4    Period  Weeks    Status  New    Target Date  12/14/19      PT LONG TERM GOAL #2   Title  Patient will improve FOTO score to >55/100 exhibiting improved mobility with less pain with ADLs.    Time  4    Period  Weeks    Status  New    Target Date  12/14/19      PT LONG TERM GOAL #3   Title  Patient will reports a worst pain of 5/10 in head over last 2 weeks to exhibit improved symptoms with ADLs.    Baseline  worst pain 8/10    Time  4    Period  Weeks    Status  New    Target Date  12/14/19  Plan - 11/16/19 1212    Clinical Impression Statement  74 yo Male reports chronic constant headache on left side occiput which radiates into lateral parietal lobe since 2015. He reports being prescribed new medication from neurologist which has helped minimally. He reports pain starts in cervical spine and radiates up left side of occiput. PT unable to reproduce pain or relieve pain during evaluation. Does not seem to change with positional movement. He does sit with a forward flexed posture indicating some relief with flexion. He exhibits functional ROM within cervical spine. He reported increased discomfort with gentle cervical distraction, no relief with suboccipital release. X-rays in 2020 show cervical spondylosis particularly C3-C4. He has tried multiple treatment approaches including chiropractic care, medication, steroid injections with no relief. He would benefit from skilled PT intervention trial to improve postural support and help with pain relief. Although concerned pain relief will be challenging considering persistent nature of pain which does not seem to change with mechanics/movement and long-term nature  of pain. Patient verbalized understanding;    Personal Factors and Comorbidities  Comorbidity 3+;Past/Current Experience;Time since onset of injury/illness/exacerbation    Comorbidities  CVA Feb 2013, some vision loss in right eye from retina bleed in 1990's, HTN (controlled), OA, cervical spondylosis without radiculopathy, former smoker (quit in 1990's)    Examination-Activity Limitations  Caring for Others;Carry;Lift;Other   difficulty concentrating/managing finances   Examination-Participation Restrictions  Cleaning;Community Activity;Medication Management;Personal Finances;Volunteer;Yard Work    Merchant navy officer  Evolving/Moderate complexity    Clinical Decision Making  Moderate    Rehab Potential  Fair    PT Frequency  2x / week    PT Duration  4 weeks    PT Treatment/Interventions  Cryotherapy;Electrical Stimulation;Moist Heat;Traction;Functional mobility training;Therapeutic activities;Therapeutic exercise;Patient/family education;Manual techniques;Passive range of motion;Dry needling;Energy conservation;Taping;Spinal Manipulations    PT Next Visit Plan  initiate HEP for postural re-education    PT Home Exercise Plan  will address next session    Consulted and Agree with Plan of Care  Patient       Patient will benefit from skilled therapeutic intervention in order to improve the following deficits and impairments:  Decreased mobility, Hypomobility, Improper body mechanics, Decreased cognition, Decreased activity tolerance, Postural dysfunction, Pain  Visit Diagnosis: Cervicalgia  Abnormal posture     Problem List Patient Active Problem List   Diagnosis Date Noted  . Acute renal failure (ARF) ([place]) 04/02/2019  . Neurogenic pain 12/21/2018  . Cervical spondylosis with radiculopathy 10/13/2018  . Melena 09/15/2018  . Acute esophagogastric ulcer 09/15/2018  . Duodenum ulcer 09/15/2018  . 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 Chronic neck pain (Primary Area of  Pain) (Left) 07/09/2018  . Chronic pain syndrome 07/09/2018  . Pharmacologic therapy 07/09/2018  . Disorder of skeletal system 07/09/2018  . Problems influencing health status 07/09/2018  . Chronic facial pain (Secondary Area of Pain) (Left) 07/09/2018  . Cervicalgia (Left) 07/09/2018  . Abnormal behavior 04/01/2018  . Socially inappropriate behavior 04/01/2018  . History of adenomatous polyp of colon 03/20/2018  . History of upper gastrointestinal bleeding 03/20/2018  . GIB (gastrointestinal bleeding) 06/19/2017  . Occipital neuralgia (Left) 03/04/2017  . GI bleed 03/03/2017  . History of stroke 03/03/2017  . Right sided weakness 03/03/2017  . Slurred speech 03/03/2017  . Word finding difficulty 03/03/2017  . Opiate use 03/03/2017  . Chronic headaches 03/03/2017  . Chronic knee pain (Bilateral) 03/03/2017  . 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 Adjustment disorder with mixed disturbance of emotions and conduct  12/06/2016  . Cervical spondylosis without myelopathy 05/16/2015  . Cervicalgia of occipito-atlanto-axial region (Left) 02/09/2015  . 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 Chronic pain disorder 09/09/2013  . Neuralgia, neuritis, and radiculitis, unspecified 09/09/2013  . Cognitive disorder 06/23/2013  . Depression, major, single episode, moderate ([place]) 06/23/2013  . Mild cognitive impairment 06/23/2013  . Anxiety and depression 05/05/2013  . Cervical facet arthropathy (Left) 05/05/2013  . Spondylosis without myelopathy or radiculopathy, cervical region 05/05/2013  . Nocturia 04/22/2013  . LVH (left ventricular hypertrophy) 02/19/2013  . Mitral insufficiency 02/19/2013  . Pure hypercholesterolemia 02/19/2013  . Tricuspid insufficiency 02/19/2013  . Cataracts, bilateral 08/21/2012  . Fuchs' corneal dystrophy 08/21/2012  . Alcohol dependence in remission ([place]) 03/14/2011  . DDD (degenerative disc disease), cervical  03/14/2011  . Persistent insomnia 03/14/2011  . Osteoarthritis involving multiple joints 03/14/2011  . Primary generalized (osteo)arthritis 03/14/2011  . ANXIETY, SITUATIONAL 06/15/2010  . Situational anxiety 06/15/2010  . CELLULITIS, HAND, LEFT 05/03/2010  . Cellulitis of unspecified part of limb 05/03/2010  . Unspecified contact dermatitis due to plants, except food 01/12/2010  . Sprain of joints and ligaments of unspecified parts of neck, initial encounter 01/03/2010  . LUNG NODULE 07/17/2009  . Chest pain 07/13/2009  . UNSPECIFIED PROSTATITIS 06/15/2009  . Alcohol abuse 05/15/2009  . Alcohol withdrawal ([place]) 04/24/2009  . Impaired fasting glucose 11/04/2007  . Gout 01/02/2007  . Essential hypertension 01/02/2007    Kyler Lerette PT, DPT 11/16/2019, 12:22 PM  [place] MAIN [facility] SERVICES [address] [place], [place], [address] Phone: [phone]   Fax:  [phone]  Name: CHUCKIE VILLAPUDUA MRN: [id number] Date of Birth: [birth year]

## 2019-11-17 ENCOUNTER — Ambulatory Visit: Payer: Medicare Other | Admitting: Urology

## 2019-11-17 ENCOUNTER — Other Ambulatory Visit: Payer: Self-pay

## 2019-11-17 ENCOUNTER — Encounter: Payer: Self-pay | Admitting: Urology

## 2019-11-17 VITALS — BP 86/56 | HR 81 | Ht 68.0 in | Wt 194.6 lb

## 2019-11-17 DIAGNOSIS — N35914 Unspecified anterior urethral stricture, male: Secondary | ICD-10-CM

## 2019-11-17 DIAGNOSIS — N390 Urinary tract infection, site not specified: Secondary | ICD-10-CM | POA: Diagnosis not present

## 2019-11-17 DIAGNOSIS — Z125 Encounter for screening for malignant neoplasm of prostate: Secondary | ICD-10-CM

## 2019-11-17 NOTE — Progress Notes (Signed)
   11/17/2019 11:39 AM   [name] [birth year] [id number]  Reason for visit: Follow up prostatitis/abnormal prostate findings on CT, history of urethral stricture  HPI: I saw [name] back in urology clinic for follow-up of prostatitis/abnormal prostate findings on CT, and history of urethral stricture.  I originally met him on 04/02/2019 when he presented to the ER with recurrent UTIs and urinary retention with a bladder scan over 800 cc and lower abdominal pain.  I was able to pass a 12 [place] Coloplast catheter and dilated distal urethral stricture and get the catheter into the bladder with return of clear yellow urine.  He passed a voiding trial a week later with a PVR less than 50 mL.  He reports he is continuing to void well and denies any urinary complaints at this time.  PVR in clinic is normal today at 17 mL.  At our last visit in December 2020 he had previously been seen in the ER with some mild dysuria and was noted to have a possible prostatic cyst versus abscess at that time.  Unfortunately urine culture was not sent at that time.  He was treated with a 3-week course of Bactrim which completely resolved his urinary symptoms.  DRE was normal at that time with no masses or nodules.  I had recommended 6-week follow-up with a PSA to rule out prostate cancer with his abnormal prostate findings on his prior CT, but he no showed that visit and rescheduled for today.  PSA has still not been drawn.  On exam, the meatus is widely patent, and there is a defect at the base of the glans from his prior penile piercing.  We had a long conversation about the risk of recurrence of a urethral stricture.  He has no urinary symptoms at this time which is very reassuring, and PVR continues to be low.  Regarding his prior CT findings, I again personally reviewed the CT today from 08/03/2019 that shows a possible prostatic cyst versus abscess versus malignancy.  I recommended repeating a PSA today, and would  consider performing a prostate biopsy if this was significantly elevated.  We reviewed the AUA guidelines that do not recommend routine screening in men over age 16, however with his abnormal prostate CT, I do feel a PSA is warranted.  His last PSA was in August 2018 and was normal at 2.84.  We discussed return precautions at length including recurrent UTIs, weakening urinary stream, pelvic pain/dysuria, or gross hematuria.  PSA today, call with results.  Pursue biopsy if PSA is significantly elevated RTC 1 year with PVR prior   [name], MD  [place] [address] [place], [place] [address] [phone]

## 2019-11-18 LAB — PSA TOTAL (REFLEX TO FREE): Prostate Specific Ag, Serum: 4.1 ng/mL — ABNORMAL HIGH (ref 0.0–4.0)

## 2019-11-18 LAB — FPSA% REFLEX
% FREE PSA: 32.9 %
PSA, FREE: 1.35 ng/mL

## 2019-11-19 ENCOUNTER — Telehealth: Payer: Self-pay

## 2019-11-19 NOTE — Telephone Encounter (Signed)
-----   Message from [name], MD sent at 11/19/2019 10:27 AM EDT ----- Kermit Balo news, PSA is normal for age, follow up as scheduled  Nickolas Madrid, MD 11/19/2019

## 2019-11-19 NOTE — Telephone Encounter (Signed)
Called pt informed him of the information below. Pt gave verbal understanding.  

## 2019-11-22 ENCOUNTER — Ambulatory Visit: Payer: Medicare Other | Admitting: Physical Therapy

## 2019-11-23 ENCOUNTER — Ambulatory Visit: Payer: Medicare Other | Admitting: Physical Therapy

## 2019-11-23 ENCOUNTER — Other Ambulatory Visit: Payer: Self-pay

## 2019-11-23 ENCOUNTER — Encounter: Payer: Self-pay | Admitting: Physical Therapy

## 2019-11-23 DIAGNOSIS — M542 Cervicalgia: Secondary | ICD-10-CM | POA: Diagnosis not present

## 2019-11-23 DIAGNOSIS — R293 Abnormal posture: Secondary | ICD-10-CM

## 2019-11-23 NOTE — Patient Instructions (Signed)
Access Code: [email or web address] URL: [email or web address] Date: 11/23/2019 Prepared by: Blanche East  Exercises Seated Upper Trapezius Stretch - 1 x daily - 7 x weekly - 1 sets - 3-5 reps - 30 sec hold Seated Scapular Retraction - 1 x daily - 7 x weekly - 2 sets - 10 reps - 5 sec hold Seated Shoulder Rolls - 1 x daily - 7 x weekly - 2 sets - 10 reps

## 2019-11-23 NOTE — Therapy (Signed)
[place] MAIN [facility] SERVICES [address] [place], [place], [address] Phone: [phone]   Fax:  [phone]  Physical Therapy Treatment  Patient Details  Name: Carl [name] MRN: [id number] Date of Birth: 09-25-1945 Referring Provider (PT): Dr. Manuella Ghazi   Encounter Date: 11/23/2019  PT End of Session - 11/23/19 1354    Visit Number  2    Number of Visits  9    Date for PT Re-Evaluation  12/14/19    Authorization Type  medicare    PT Start Time  1348    PT Stop Time  1430    PT Time Calculation (min)  42 min    Activity Tolerance  Patient tolerated treatment well    Behavior During Therapy  [facility] for tasks assessed/performed       Past Medical History:  Diagnosis Date  . Adenoma of colon   . Alcoholism ([place])    still drinking wine as of Feb 2013  . Anxiety and depression   . Cataract   . Cervical spondylosis without myelopathy   . Cervicalgia of occipito-atlanto-axial region   . Chronic pain disorder   . Degeneration of cervical intervertebral disc   . Degeneration, intervertebral disc, cervical   . Duodenal ulcer 2011  . Facet arthropathy, cervical   . Fuchs' corneal dystrophy   . Gastric ulcer 2011   egd at [facility] Aug 2011.  . GI bleed   . Gout   . Hypertension   . Impaired fasting glucose   . 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 LVH (left ventricular hypertrophy)   . Mild cognitive impairment   . Mitral insufficiency   . Neck pain   . Neuralgia and neuritis   . Nocturia   . Occipital neuralgia   . Osteoarthritis    neck  . Osteoarthritis   . Pure hypercholesterolemia   . Radiculitis   . Stroke ([place])   . Tricuspid insufficiency     Past Surgical History:  Procedure Laterality Date  . COLONOSCOPY WITH PROPOFOL N/A 06/05/2018   Procedure: COLONOSCOPY WITH PROPOFOL;  Surgeon: Manya Silvas, MD;  Location: [facility] ENDOSCOPY;  Service: Endoscopy;  Laterality: N/A;   . CYSTOGRAM  03/08  . ESOPHAGOGASTRODUODENOSCOPY (EGD) WITH PROPOFOL N/A 06/20/2017   Procedure: ESOPHAGOGASTRODUODENOSCOPY (EGD) WITH PROPOFOL;  Surgeon: Lucilla Lame, MD;  Location: [facility] ENDOSCOPY;  Service: Endoscopy;  Laterality: N/A;    There were no vitals filed for this visit.  Subjective Assessment - 11/23/19 1353    Subjective  Patient reports feeling a little better after evaluation for 1-2 days and then pain returned;    Pertinent History  74 yo male reports chronic neck/headache on left side since 2014-2015. He reports having good strength in BUE but states that the pain is so severe that it does interfere with cognition and concentration. He reports pain is constant. He has tried multiple conservative treatment including medications, chiropractor (only temporary relief, stopped in Sprin 2020), steroid injections with no relief. Dr. Manuella Ghazi recently prescribed verampamil which he states has helped some. He is still requiring over the counter medication for pain control.  PMH significant: CVA Feb 2013, some vision loss in right eye from retina bleed in 1990's, HTN (controlled), OA, cervical spondylosis without radiculopathy, former smoker (quit in 1990's)    Limitations  Other (comment);Reading;Writing   yard work   How long can you sit comfortably?  NA    How long can  you stand comfortably?  NA    How long can you walk comfortably?  NA    Diagnostic tests  X-ray in March 2020- multiple level cervical spondylosis with loss of disc height especially C3-C4;    Patient Stated Goals  "get rid of pain so that I can do what I would like to do."    Currently in Pain?  Yes    Pain Score  6     Pain Location  Head    Pain Orientation  Left;Posterior    Pain Descriptors / Indicators  Headache    Pain Type  Chronic pain    Pain Onset  More than a month ago    Pain Frequency  Constant    Aggravating Factors   worse without medication    Pain Relieving Factors  medication    Effect of Pain on  Daily Activities  decreased activity tolerance;    Multiple Pain Sites  No          TREATMENT: Patient supine with moist heat to cervical paraspinals, concurrent with exercise/PROM:  PT performed PROM for increased cervical flexibility and to help reduce pain:  PT performed passive upper trap stretch left side 30 sec hold x2 reps; PT performed passive levator scapulae stretch left side 30 sec hold x2 reps; Patient denies any tightness with levator scapulae but did have tightness with upper trap stretch;  PT performed passive lateral translation of occiput left/right x5 reps x2 sets PT performed grade II-III lateral mobs C2/C1 left to right 15 sec bouts x3 sets PT performed suboccipital release 30 sec hold, 10 sec rest x5 min;  Progressed to suboccipital release concurrent with chin tucks 5 sec hold x10 reps; Patient does report increased tenderness at left occiput and suboccipital muscles. Mild tightness noted although hard to palpate trigger points. He also reports pain along left C3 level parapsinals and along C3 nerve root.  PT performed gentle cervical distraction 20 sec hold, 10 sec rest x5 min; He reports slight discomfort in cervical spine with distraction but then relief of symptoms including slight relief in headache during relaxation;    PT instructed patient in HEP for cervical ROM/stretch and postural strengthening: Seated left upper trap stretch 20 sec hold x2 reps with cues to hold bottom of chair to keep shoulder down for better stretch; Posterior shoulder rolls x10 reps with cues to focus on just posterior direction and avoid forward circles Scapular retraction 5 sec hold x10 reps with min VCs to relax upper trap and isolate scapular retraction for better postural control;  Response to treatment: Patient reports feeling mildly better at end of session. Although he admits its hard to rate pain as he is on a high regiment of pain medications to help cope with severe  discomfort and therefore feels that his pain is dulled and has a hard time to determine change. Pt verbalized understanding of HEP;                       PT Education - 11/23/19 1354    Education Details  manual therapy/posture    Person(s) Educated  Patient    Methods  Explanation;Verbal cues    Comprehension  Verbalized understanding;Returned demonstration;Verbal cues required;Need further instruction       PT Short Term Goals - 11/16/19 1219      PT SHORT TERM GOAL #1   Title  Patient will be adherent to HEP at least 3x a week to improve  functional strength and balance for better safety at home.    Time  2    Period  Weeks    Status  New    Target Date  11/30/19        PT Long Term Goals - 11/16/19 1219      PT LONG TERM GOAL #1   Title  Patient will exhibit improved postural support with erect head in sitting/standing at least 50+ % of time to improve safety awareness with activities and reduce neck discomfort    Time  4    Period  Weeks    Status  New    Target Date  12/14/19      PT LONG TERM GOAL #2   Title  Patient will improve FOTO score to >55/100 exhibiting improved mobility with less pain with ADLs.    Time  4    Period  Weeks    Status  New    Target Date  12/14/19      PT LONG TERM GOAL #3   Title  Patient will reports a worst pain of 5/10 in head over last 2 weeks to exhibit improved symptoms with ADLs.    Baseline  worst pain 8/10    Time  4    Period  Weeks    Status  New    Target Date  12/14/19            Plan - 11/24/19 0820    Clinical Impression Statement  Patient motivated and responded well to therapy; PT performed extensive manual therapy with significant tenderness reported along left occiput, and left cervical paraspinals particularly around c3. Patient reports slight reduction in pain at end of session although admits that it is hard to rate pain due to his high pain medication regiment and his hightened  sensitivity. Instructed patient in cervcial stretches and postural strengthening to help reduce discomfort and improve postural awareness. Provided written HEP for adherence. Patient would benefit form additional skilled PT intervention to improve postural control and reduce pain;    Personal Factors and Comorbidities  Comorbidity 3+;Past/Current Experience;Time since onset of injury/illness/exacerbation    Comorbidities  CVA Feb 2013, some vision loss in right eye from retina bleed in 1990's, HTN (controlled), OA, cervical spondylosis without radiculopathy, former smoker (quit in 1990's)    Examination-Activity Limitations  Caring for Others;[name];Lift;Other   difficulty concentrating/managing finances   Examination-Participation Restrictions  Cleaning;Community Activity;Medication Management;Personal Finances;Volunteer;Yard Work    [employer]  Evolving/Moderate complexity    Rehab Potential  Fair    PT Frequency  2x / week    PT Duration  4 weeks    PT Treatment/Interventions  Cryotherapy;Electrical Stimulation;Moist Heat;Traction;Functional mobility training;Therapeutic activities;Therapeutic exercise;Patient/family education;Manual techniques;Passive range of motion;Dry needling;Energy conservation;Taping;Spinal Manipulations    PT Next Visit Plan  initiate HEP for postural re-education    PT Home Exercise Plan  will address next session    Consulted and Agree with Plan of Care  Patient       Patient will benefit from skilled therapeutic intervention in order to improve the following deficits and impairments:  Decreased mobility, Hypomobility, Improper body mechanics, Decreased cognition, Decreased activity tolerance, Postural dysfunction, Pain  Visit Diagnosis: Cervicalgia  Abnormal posture     Problem List Patient Active Problem List   Diagnosis Date Noted  . Acute renal failure (ARF) ([place]) 04/02/2019  . Neurogenic pain 12/21/2018  . Cervical spondylosis  with radiculopathy 10/13/2018  . Melena 09/15/2018  . Acute esophagogastric ulcer  09/15/2018  . Duodenum ulcer 09/15/2018  . 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 Chronic neck pain (Primary Area of Pain) (Left) 07/09/2018  . Chronic pain syndrome 07/09/2018  . Pharmacologic therapy 07/09/2018  . Disorder of skeletal system 07/09/2018  . Problems influencing health status 07/09/2018  . Chronic facial pain (Secondary Area of Pain) (Left) 07/09/2018  . Cervicalgia (Left) 07/09/2018  . Abnormal behavior 04/01/2018  . Socially inappropriate behavior 04/01/2018  . History of adenomatous polyp of colon 03/20/2018  . History of upper gastrointestinal bleeding 03/20/2018  . GIB (gastrointestinal bleeding) 06/19/2017  . Occipital neuralgia (Left) 03/04/2017  . GI bleed 03/03/2017  . History of stroke 03/03/2017  . Right sided weakness 03/03/2017  . Slurred speech 03/03/2017  . Word finding difficulty 03/03/2017  . Opiate use 03/03/2017  . Chronic headaches 03/03/2017  . Chronic knee pain (Bilateral) 03/03/2017  . 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 Cervical spondylosis without myelopathy 05/16/2015  . Cervicalgia of occipito-atlanto-axial region (Left) 02/09/2015  . 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 Chronic pain disorder 09/09/2013  . Neuralgia, neuritis, and radiculitis, unspecified 09/09/2013  . Cognitive disorder 06/23/2013  . Depression, major, single episode, moderate ([place]) 06/23/2013  . Mild cognitive impairment 06/23/2013  . Anxiety and depression 05/05/2013  . Cervical facet arthropathy (Left) 05/05/2013  . Spondylosis without myelopathy or radiculopathy, cervical region 05/05/2013  . Nocturia 04/22/2013  . LVH (left ventricular hypertrophy) 02/19/2013  . Mitral insufficiency 02/19/2013  . Pure hypercholesterolemia 02/19/2013  . Tricuspid  insufficiency 02/19/2013  . Cataracts, bilateral 08/21/2012  . Fuchs' corneal dystrophy 08/21/2012  . Alcohol dependence in remission ([place]) 03/14/2011  . DDD (degenerative disc disease), cervical 03/14/2011  . Persistent insomnia 03/14/2011  . Osteoarthritis involving multiple joints 03/14/2011  . Primary generalized (osteo)arthritis 03/14/2011  . ANXIETY, SITUATIONAL 06/15/2010  . Situational anxiety 06/15/2010  . CELLULITIS, HAND, LEFT 05/03/2010  . Cellulitis of unspecified part of limb 05/03/2010  . Unspecified contact dermatitis due to plants, except food 01/12/2010  . Sprain of joints and ligaments of unspecified parts of neck, initial encounter 01/03/2010  . LUNG NODULE 07/17/2009  . Chest pain 07/13/2009  . UNSPECIFIED PROSTATITIS 06/15/2009  . Alcohol abuse 05/15/2009  . Alcohol withdrawal ([place]) 04/24/2009  . Impaired fasting glucose 11/04/2007  . Gout 01/02/2007  . Essential hypertension 01/02/2007    Marieke Lubke PT, DPT 11/24/2019, 8:46 AM  [place] MAIN [facility] SERVICES [address] [place], [place], [address] Phone: [phone]   Fax:  [phone]  Name: Carl [name] MRN: [id number] Date of Birth: 07-12-1946

## 2019-11-24 ENCOUNTER — Ambulatory Visit: Payer: Medicare Other | Admitting: Physical Therapy

## 2019-11-25 ENCOUNTER — Encounter: Payer: Self-pay | Admitting: Physical Therapy

## 2019-11-25 ENCOUNTER — Other Ambulatory Visit: Payer: Self-pay

## 2019-11-25 ENCOUNTER — Ambulatory Visit: Payer: Medicare Other | Admitting: Physical Therapy

## 2019-11-25 DIAGNOSIS — M542 Cervicalgia: Secondary | ICD-10-CM

## 2019-11-25 DIAGNOSIS — R293 Abnormal posture: Secondary | ICD-10-CM

## 2019-11-25 NOTE — Patient Instructions (Addendum)
Distraction: Lower Cervical    Place fist between chin and chest. Press chin into fist. Hold for 5 sec Repeat _10___ times per set. Do _1___ sets per session. Do __5__ sessions per week.  Copyright  VHI. All rights reserved.   Access Code: [email or web address] URL: [email or web address] Date: 11/25/2019 Prepared by: Blanche East  Exercises Standing Bilateral Low Shoulder Row with Anchored Resistance - 1 x daily - 7 x weekly - 10 reps - 3 sets

## 2019-11-25 NOTE — Therapy (Signed)
[place] MAIN [facility] SERVICES [address] [place], [place], [address] Phone: [phone]   Fax:  [phone]  Physical Therapy Treatment  Patient Details  Name: Carl [name] MRN: [id number] Date of Birth: November 02, 1945 Referring Provider (PT): Dr. Manuella Ghazi   Encounter Date: 11/25/2019  PT End of Session - 11/25/19 1115    Visit Number  3    Number of Visits  9    Date for PT Re-Evaluation  12/14/19    Authorization Type  medicare    PT Start Time  1106    PT Stop Time  1145    PT Time Calculation (min)  39 min    Activity Tolerance  Patient tolerated treatment well    Behavior During Therapy  [facility] for tasks assessed/performed       Past Medical History:  Diagnosis Date  . Adenoma of colon   . Alcoholism ([place])    still drinking wine as of Feb 2013  . Anxiety and depression   . Cataract   . Cervical spondylosis without myelopathy   . Cervicalgia of occipito-atlanto-axial region   . Chronic pain disorder   . Degeneration of cervical intervertebral disc   . Degeneration, intervertebral disc, cervical   . Duodenal ulcer 2011  . Facet arthropathy, cervical   . Fuchs' corneal dystrophy   . Gastric ulcer 2011   egd at [facility] Aug 2011.  . GI bleed   . Gout   . Hypertension   . Impaired fasting glucose   . 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 LVH (left ventricular hypertrophy)   . Mild cognitive impairment   . Mitral insufficiency   . Neck pain   . Neuralgia and neuritis   . Nocturia   . Occipital neuralgia   . Osteoarthritis    neck  . Osteoarthritis   . Pure hypercholesterolemia   . Radiculitis   . Stroke ([place])   . Tricuspid insufficiency     Past Surgical History:  Procedure Laterality Date  . COLONOSCOPY WITH PROPOFOL N/A 06/05/2018   Procedure: COLONOSCOPY WITH PROPOFOL;  Surgeon: Manya Silvas, MD;  Location[facility] ENDOSCOPY;  Service: Endoscopy;  Laterality: N/A;   . CYSTOGRAM  03/08  . ESOPHAGOGASTRODUODENOSCOPY (EGD) WITH PROPOFOL N/A 06/20/2017   Procedure: ESOPHAGOGASTRODUODENOSCOPY (EGD) WITH PROPOFOL;  Surgeon: Lucilla Lame, MD;  Location: [facility] ENDOSCOPY;  Service: Endoscopy;  Laterality: N/A;    There were no vitals filed for this visit.  Subjective Assessment - 11/25/19 1112    Subjective  Patient reports feeling like he is a little better but is having increased soreness to mid back/shoulders;    Pertinent History  74 yo male reports chronic neck/headache on left side since 2014-2015. He reports having good strength in BUE but states that the pain is so severe that it does interfere with cognition and concentration. He reports pain is constant. He has tried multiple conservative treatment including medications, chiropractor (only temporary relief, stopped in Sprin 2020), steroid injections with no relief. Dr. Manuella Ghazi recently prescribed verampamil which he states has helped some. He is still requiring over the counter medication for pain control.  PMH significant: CVA Feb 2013, some vision loss in right eye from retina bleed in 1990's, HTN (controlled), OA, cervical spondylosis without radiculopathy, former smoker (quit in 1990's)    Limitations  Other (comment);Reading;Writing   yard work   How long can you sit comfortably?  NA    How  long can you stand comfortably?  NA    How long can you walk comfortably?  NA    Diagnostic tests  X-ray in March 2020- multiple level cervical spondylosis with loss of disc height especially C3-C4;    Patient Stated Goals  "get rid of pain so that I can do what I would like to do."    Currently in Pain?  Yes    Pain Score  3     Pain Location  Head    Pain Orientation  Left;Posterior    Pain Descriptors / Indicators  Headache    Pain Type  Chronic pain    Pain Onset  More than a month ago    Pain Frequency  Constant    Aggravating Factors   worse without medication    Pain Relieving Factors  medication     Effect of Pain on Daily Activities  decreased activity tolerance;    Multiple Pain Sites  No           TREATMENT: Patient supine with moist heat to cervical paraspinals and thoracic paraspinals, concurrent with exercise/PROM:   PT performed PROM for increased cervical flexibility and to help reduce pain:  PT performed passive upper trap stretch left side 30 sec hold x2 reps;   PT performed passive lateral translation of occiput left/right x5 reps x2 sets PT performed grade II-III lateral mobs C2/C1 left to right 15 sec bouts x3 sets PT performed suboccipital release 30 sec hold, 10 sec rest x5 min;  Progressed to suboccipital release concurrent with chin tucks 5 sec hold x10 reps; Patient does report increased tenderness at left occiput and suboccipital muscles. Mild tightness noted although hard to palpate trigger points. He also reports pain along left C3 level parapsinals and along C3 nerve root.   PT performed gentle cervical distraction 20 sec hold, 10 sec rest x5 min; He reports slight discomfort in cervical spine with distraction but then relief of symptoms including slight relief in headache during relaxation;     PT performed soft tissue massage along left cervical paraspinals x10 min; PT identified increased tightness along upper trap, suboccipital muscles; Patient tolerated well reporting less tenderness and tightness following soft tissue massage;   PT instructed patient in HEP for cervical ROM/stretch and postural strengthening: Low rows red tband x10 reps; required min VCs to avoid shoulder elevation for better postural strength and to reduce discomfort;  Cervical distraction with fist under chin 5 sec hold x5 reps, required min VCs to avoid pushing too hard for better tolerance;   Response to treatment: Patient reports feeling mildly better at end of session. Although he admits its hard to rate pain as he is on a high regiment of pain medications to help cope with severe  discomfort and therefore feels that his pain is dulled and has a hard time to determine change. Pt verbalized understanding of HEP;                      PT Education - 11/25/19 1115    Education Details  manual therapy/posture    Person(s) Educated  Patient    Methods  Explanation;Verbal cues    Comprehension  Verbalized understanding;Returned demonstration;Verbal cues required;Need further instruction       PT Short Term Goals - 11/16/19 1219      PT SHORT TERM GOAL #1   Title  Patient will be adherent to HEP at least 3x a week to improve functional strength and  balance for better safety at home.    Time  2    Period  Weeks    Status  New    Target Date  11/30/19        PT Long Term Goals - 11/16/19 1219      PT LONG TERM GOAL #1   Title  Patient will exhibit improved postural support with erect head in sitting/standing at least 50+ % of time to improve safety awareness with activities and reduce neck discomfort    Time  4    Period  Weeks    Status  New    Target Date  12/14/19      PT LONG TERM GOAL #2   Title  Patient will improve FOTO score to >55/100 exhibiting improved mobility with less pain with ADLs.    Time  4    Period  Weeks    Status  New    Target Date  12/14/19      PT LONG TERM GOAL #3   Title  Patient will reports a worst pain of 5/10 in head over last 2 weeks to exhibit improved symptoms with ADLs.    Baseline  worst pain 8/10    Time  4    Period  Weeks    Status  New    Target Date  12/14/19            Plan - 11/25/19 1152    Clinical Impression Statement  Patient motivated and responded well to therapy; He does exhibit increased tightness in left cervical paraspinals this session which was relieved with manual therapy; Patient does report feeling some better following session but is unable to rate pain. He was instructed in advanced postural strengthening as part of HEP. Patient would benefit from additional skilled PT  Intervention to improve postural control and reduce neck/headache;    Personal Factors and Comorbidities  Comorbidity 3+;Past/Current Experience;Time since onset of injury/illness/exacerbation    Comorbidities  CVA Feb 2013, some vision loss in right eye from retina bleed in 1990's, HTN (controlled), OA, cervical spondylosis without radiculopathy, former smoker (quit in 1990's)    Examination-Activity Limitations  Caring for Others;Carry;Lift;Other   difficulty concentrating/managing finances   Examination-Participation Restrictions  Cleaning;Community Activity;Medication Management;Personal Finances;Volunteer;Yard Work    [employer]  Evolving/Moderate complexity    Rehab Potential  Fair    PT Frequency  2x / week    PT Duration  4 weeks    PT Treatment/Interventions  Cryotherapy;Electrical Stimulation;Moist Heat;Traction;Functional mobility training;Therapeutic activities;Therapeutic exercise;Patient/family education;Manual techniques;Passive range of motion;Dry needling;Energy conservation;Taping;Spinal Manipulations    PT Next Visit Plan  initiate HEP for postural re-education    PT Home Exercise Plan  will address next session    Consulted and Agree with Plan of Care  Patient       Patient will benefit from skilled therapeutic intervention in order to improve the following deficits and impairments:  Decreased mobility, Hypomobility, Improper body mechanics, Decreased cognition, Decreased activity tolerance, Postural dysfunction, Pain  Visit Diagnosis: Cervicalgia  Abnormal posture     Problem List Patient Active Problem List   Diagnosis Date Noted  . Acute renal failure (ARF) ([place]) 04/02/2019  . Neurogenic pain 12/21/2018  . Cervical spondylosis with radiculopathy 10/13/2018  . Melena 09/15/2018  . Acute esophagogastric ulcer 09/15/2018  . Duodenum ulcer 09/15/2018  . 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 Chronic neck pain (Primary Area of Pain) (Left)  07/09/2018  . Chronic pain syndrome 07/09/2018  .  Pharmacologic therapy 07/09/2018  . Disorder of skeletal system 07/09/2018  . Problems influencing health status 07/09/2018  . Chronic facial pain (Secondary Area of Pain) (Left) 07/09/2018  . Cervicalgia (Left) 07/09/2018  . Abnormal behavior 04/01/2018  . Socially inappropriate behavior 04/01/2018  . History of adenomatous polyp of colon 03/20/2018  . History of upper gastrointestinal bleeding 03/20/2018  . GIB (gastrointestinal bleeding) 06/19/2017  . Occipital neuralgia (Left) 03/04/2017  . GI bleed 03/03/2017  . History of stroke 03/03/2017  . Right sided weakness 03/03/2017  . Slurred speech 03/03/2017  . Word finding difficulty 03/03/2017  . Opiate use 03/03/2017  . Chronic headaches 03/03/2017  . Chronic knee pain (Bilateral) 03/03/2017  . 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 Cervical spondylosis without myelopathy 05/16/2015  . Cervicalgia of occipito-atlanto-axial region (Left) 02/09/2015  . 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 Chronic pain disorder 09/09/2013  . Neuralgia, neuritis, and radiculitis, unspecified 09/09/2013  . Cognitive disorder 06/23/2013  . Depression, major, single episode, moderate ([place]) 06/23/2013  . Mild cognitive impairment 06/23/2013  . Anxiety and depression 05/05/2013  . Cervical facet arthropathy (Left) 05/05/2013  . Spondylosis without myelopathy or radiculopathy, cervical region 05/05/2013  . Nocturia 04/22/2013  . LVH (left ventricular hypertrophy) 02/19/2013  . Mitral insufficiency 02/19/2013  . Pure hypercholesterolemia 02/19/2013  . Tricuspid insufficiency 02/19/2013  . Cataracts, bilateral 08/21/2012  . Fuchs' corneal dystrophy 08/21/2012  . Alcohol dependence in remission ([place]) 03/14/2011  . DDD (degenerative disc disease), cervical 03/14/2011  .  Persistent insomnia 03/14/2011  . Osteoarthritis involving multiple joints 03/14/2011  . Primary generalized (osteo)arthritis 03/14/2011  . ANXIETY, SITUATIONAL 06/15/2010  . Situational anxiety 06/15/2010  . CELLULITIS, HAND, LEFT 05/03/2010  . Cellulitis of unspecified part of limb 05/03/2010  . Unspecified contact dermatitis due to plants, except food 01/12/2010  . Sprain of joints and ligaments of unspecified parts of neck, initial encounter 01/03/2010  . LUNG NODULE 07/17/2009  . Chest pain 07/13/2009  . UNSPECIFIED PROSTATITIS 06/15/2009  . Alcohol abuse 05/15/2009  . Alcohol withdrawal ([place]) 04/24/2009  . Impaired fasting glucose 11/04/2007  . Gout 01/02/2007  . Essential hypertension 01/02/2007    Chistopher Mangino PT, DPT 11/25/2019, 11:55 AM  [place] MAIN [facility] SERVICES [address] [place], [place], [address] Phone: [phone]   Fax:  [phone]  Name: Carl [name] MRN: [id number] Date of Birth: July 17, 1946

## 2019-11-29 ENCOUNTER — Ambulatory Visit: Payer: Medicare Other | Admitting: Physical Therapy

## 2019-11-30 ENCOUNTER — Other Ambulatory Visit: Payer: Self-pay

## 2019-11-30 ENCOUNTER — Ambulatory Visit: Payer: Medicare Other | Admitting: Physical Therapy

## 2019-11-30 ENCOUNTER — Encounter: Payer: Self-pay | Admitting: Physical Therapy

## 2019-11-30 DIAGNOSIS — M542 Cervicalgia: Secondary | ICD-10-CM

## 2019-11-30 DIAGNOSIS — R293 Abnormal posture: Secondary | ICD-10-CM

## 2019-11-30 NOTE — Therapy (Signed)
[place] MAIN [facility] SERVICES [address] [place], [place], [address] Phone: [phone]   Fax:  [phone]  Physical Therapy Treatment  Patient Details  Name: Carl [name] MRN: [id number] Date of Birth: 08-23-1946 Referring Provider (PT): Dr. Manuella Ghazi   Encounter Date: 11/30/2019  PT End of Session - 11/30/19 1136    Visit Number  4    Number of Visits  9    Date for PT Re-Evaluation  12/14/19    Authorization Type  medicare    PT Start Time  1122    PT Stop Time  1210    PT Time Calculation (min)  48 min    Activity Tolerance  Patient tolerated treatment well    Behavior During Therapy  [facility] for tasks assessed/performed       Past Medical History:  Diagnosis Date  . Adenoma of colon   . Alcoholism ([place])    still drinking wine as of Feb 2013  . Anxiety and depression   . Cataract   . Cervical spondylosis without myelopathy   . Cervicalgia of occipito-atlanto-axial region   . Chronic pain disorder   . Degeneration of cervical intervertebral disc   . Degeneration, intervertebral disc, cervical   . Duodenal ulcer 2011  . Facet arthropathy, cervical   . Fuchs' corneal dystrophy   . Gastric ulcer 2011   egd at [facility] Aug 2011.  . GI bleed   . Gout   . Hypertension   . Impaired fasting glucose   . 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 LVH (left ventricular hypertrophy)   . Mild cognitive impairment   . Mitral insufficiency   . Neck pain   . Neuralgia and neuritis   . Nocturia   . Occipital neuralgia   . Osteoarthritis    neck  . Osteoarthritis   . Pure hypercholesterolemia   . Radiculitis   . Stroke ([place])   . Tricuspid insufficiency     Past Surgical History:  Procedure Laterality Date  . COLONOSCOPY WITH PROPOFOL N/A 06/05/2018   Procedure: COLONOSCOPY WITH PROPOFOL;  Surgeon: Manya Silvas, MD;  Location: [facility] ENDOSCOPY;  Service: Endoscopy;  Laterality: N/A;   . CYSTOGRAM  03/08  . ESOPHAGOGASTRODUODENOSCOPY (EGD) WITH PROPOFOL N/A 06/20/2017   Procedure: ESOPHAGOGASTRODUODENOSCOPY (EGD) WITH PROPOFOL;  Surgeon: Lucilla Lame, MD;  Location: [facility] ENDOSCOPY;  Service: Endoscopy;  Laterality: N/A;    There were no vitals filed for this visit.  Subjective Assessment - 11/30/19 1134    Subjective  Patient reports feeling a little better. He states that he didn't take as much pain medicine this morning. Reports adherence with HEP    Pertinent History  74 yo male reports chronic neck/headache on left side since 2014-2015. He reports having good strength in BUE but states that the pain is so severe that it does interfere with cognition and concentration. He reports pain is constant. He has tried multiple conservative treatment including medications, chiropractor (only temporary relief, stopped in Sprin 2020), steroid injections with no relief. Dr. Manuella Ghazi recently prescribed verampamil which he states has helped some. He is still requiring over the counter medication for pain control.  PMH significant: CVA Feb 2013, some vision loss in right eye from retina bleed in 1990's, HTN (controlled), OA, cervical spondylosis without radiculopathy, former smoker (quit in 1990's)    Limitations  Other (comment);Reading;Writing   yard work   How long can you sit comfortably?  NA    How long can you stand comfortably?  NA    How long can you walk comfortably?  NA    Diagnostic tests  X-ray in March 2020- multiple level cervical spondylosis with loss of disc height especially C3-C4;    Patient Stated Goals  "get rid of pain so that I can do what I would like to do."    Currently in Pain?  Yes    Pain Score  5     Pain Location  Head    Pain Orientation  Left;Posterior    Pain Descriptors / Indicators  Headache    Pain Type  Chronic pain    Pain Onset  More than a month ago    Pain Frequency  Constant    Aggravating Factors   worse without medication    Pain Relieving  Factors  medication    Effect of Pain on Daily Activities  decreased activity tolerance;    Multiple Pain Sites  No          TREATMENT: Patient prone: PT applied moist heat to cervical/thoracic paraspinals in prone position;   Trigger Point Dry Needling (TDN), unbilled Education performed with patient regarding potential benefit of TDN. Reviewed precautions and risks with patient. Reviewed special precautions/risks over lung fields which include pneumothorax. Reviewed signs and symptoms of pneumothorax and advised pt to go to ER immediately if these symptoms develop advise them of dry needling treatment. Ample time spent with pt to ensure full understanding of TDN risks. Pt provided verbal consent to treatment. TDN performed to L upper trap with 1, 0.305 x 60 single needle placement. Also performed TDN to L suboccipitals and L cervical multifidus (C3 level) with 2, 0.25 x 40 single needle placements. For all placements utilized long duration hold with gentle pistoning prior to removal. Pt reports feeling the placement in the muscle at all three sites, at the C3 multifidus pt does have some radiating pain out laterally. Jason Huprich PT, DPT, GCS    Patient prone: PT performed soft tissue massage to upper cervical paraspinals including suboccipitals (L>R) x5 min;  PT performed grade II-III PA mobs at T1, T2, T3, T4, T5, T6, 15 sec bouts x2 sets each; Increased hypomobility noted at T1, T2, T3 with better mobility noted at T4-T6; Improved mobility noted during 2nd bout with less pain/discomfort reported;  Prone with 2# handweight: -BUE shoulder extension x10 reps; -BUE low row x15 -BUE mid row x10 reps; Patient required min-moderate verbal/tactile cues for correct exercise technique.  He did have increased difficulty with low/mid row reporting increased fatigue  Patient transitioned supine: PT performed PROM for increased cervical flexibility and to help reduce pain: PT performed  passive upper trap stretch left side 30 sec hold x2 reps;   PT performed suboccipital release 30 sec hold, 10 sec rest x5 min;   PT performed gentle cervical distraction 20 sec hold, 10 sec rest x5 min; He reports slight discomfort in cervical spine with distraction but then relief of symptoms including slight relief in headache during relaxation;    Response to treatment: Patient reports feeling mildly better at end of session.  Pt verbalized understanding of HEP;             PT Education - 11/30/19 1121    Education Details  manual therapy/posture    Person(s) Educated  Patient    Methods  Explanation;Verbal cues    Comprehension  Verbalized understanding;Returned demonstration;Verbal cues required;Need further instruction  PT Short Term Goals - 11/16/19 1219      PT SHORT TERM GOAL #1   Title  Patient will be adherent to HEP at least 3x a week to improve functional strength and balance for better safety at home.    Time  2    Period  Weeks    Status  New    Target Date  11/30/19        PT Long Term Goals - 11/16/19 1219      PT LONG TERM GOAL #1   Title  Patient will exhibit improved postural support with erect head in sitting/standing at least 50+ % of time to improve safety awareness with activities and reduce neck discomfort    Time  4    Period  Weeks    Status  New    Target Date  12/14/19      PT LONG TERM GOAL #2   Title  Patient will improve FOTO score to >55/100 exhibiting improved mobility with less pain with ADLs.    Time  4    Period  Weeks    Status  New    Target Date  12/14/19      PT LONG TERM GOAL #3   Title  Patient will reports a worst pain of 5/10 in head over last 2 weeks to exhibit improved symptoms with ADLs.    Baseline  worst pain 8/10    Time  4    Period  Weeks    Status  New    Target Date  12/14/19            Plan - 11/30/19 1250    Clinical Impression Statement  Patient motivated and participated well  within session. He does exhibit increased tightness in cervical paraspinals especially on left side. PT performed trigger point dry needling to help reduce tightness and relieve symptoms. Patient tolerated fair. He did have increased sensitivity during dry needling. PT performed joint mobilization with increased hypomobility noted in thoracic spine. PT instructed patient in scapular retraction/BUE strengthening to improve postural support. Patient tolerated session well. He reports less discomfort and reports less stiffness. Reinforced HEP;    Personal Factors and Comorbidities  Comorbidity 3+;Past/Current Experience;Time since onset of injury/illness/exacerbation    Comorbidities  CVA Feb 2013, some vision loss in right eye from retina bleed in 1990's, HTN (controlled), OA, cervical spondylosis without radiculopathy, former smoker (quit in 1990's)    Examination-Activity Limitations  Caring for Others;Carry;Lift;Other   difficulty concentrating/managing finances   Examination-Participation Restrictions  Cleaning;Community Activity;Medication Management;Personal Finances;Volunteer;Yard Work    [employer]  Evolving/Moderate complexity    Rehab Potential  Fair    PT Frequency  2x / week    PT Duration  4 weeks    PT Treatment/Interventions  Cryotherapy;Electrical Stimulation;Moist Heat;Traction;Functional mobility training;Therapeutic activities;Therapeutic exercise;Patient/family education;Manual techniques;Passive range of motion;Dry needling;Energy conservation;Taping;Spinal Manipulations    PT Next Visit Plan  initiate HEP for postural re-education    PT Home Exercise Plan  will address next session    Consulted and Agree with Plan of Care  Patient       Patient will benefit from skilled therapeutic intervention in order to improve the following deficits and impairments:  Decreased mobility, Hypomobility, Improper body mechanics, Decreased cognition, Decreased activity  tolerance, Postural dysfunction, Pain  Visit Diagnosis: Cervicalgia  Abnormal posture     Problem List Patient Active Problem List   Diagnosis Date Noted  . Acute renal failure (ARF) ([place]) 04/02/2019  .  Neurogenic pain 12/21/2018  . Cervical spondylosis with radiculopathy 10/13/2018  . Melena 09/15/2018  . Acute esophagogastric ulcer 09/15/2018  . Duodenum ulcer 09/15/2018  . 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 Chronic neck pain (Primary Area of Pain) (Left) 07/09/2018  . Chronic pain syndrome 07/09/2018  . Pharmacologic therapy 07/09/2018  . Disorder of skeletal system 07/09/2018  . Problems influencing health status 07/09/2018  . Chronic facial pain (Secondary Area of Pain) (Left) 07/09/2018  . Cervicalgia (Left) 07/09/2018  . Abnormal behavior 04/01/2018  . Socially inappropriate behavior 04/01/2018  . History of adenomatous polyp of colon 03/20/2018  . History of upper gastrointestinal bleeding 03/20/2018  . GIB (gastrointestinal bleeding) 06/19/2017  . Occipital neuralgia (Left) 03/04/2017  . GI bleed 03/03/2017  . History of stroke 03/03/2017  . Right sided weakness 03/03/2017  . Slurred speech 03/03/2017  . Word finding difficulty 03/03/2017  . Opiate use 03/03/2017  . Chronic headaches 03/03/2017  . Chronic knee pain (Bilateral) 03/03/2017  . 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 Cervical spondylosis without myelopathy 05/16/2015  . Cervicalgia of occipito-atlanto-axial region (Left) 02/09/2015  . 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 Chronic pain disorder 09/09/2013  . Neuralgia, neuritis, and radiculitis, unspecified 09/09/2013  . Cognitive disorder 06/23/2013  . Depression, major, single episode, moderate ([place]) 06/23/2013  . Mild cognitive impairment 06/23/2013  . Anxiety and depression 05/05/2013  . Cervical facet arthropathy  (Left) 05/05/2013  . Spondylosis without myelopathy or radiculopathy, cervical region 05/05/2013  . Nocturia 04/22/2013  . LVH (left ventricular hypertrophy) 02/19/2013  . Mitral insufficiency 02/19/2013  . Pure hypercholesterolemia 02/19/2013  . Tricuspid insufficiency 02/19/2013  . Cataracts, bilateral 08/21/2012  . Fuchs' corneal dystrophy 08/21/2012  . Alcohol dependence in remission ([place]) 03/14/2011  . DDD (degenerative disc disease), cervical 03/14/2011  . Persistent insomnia 03/14/2011  . Osteoarthritis involving multiple joints 03/14/2011  . Primary generalized (osteo)arthritis 03/14/2011  . ANXIETY, SITUATIONAL 06/15/2010  . Situational anxiety 06/15/2010  . CELLULITIS, HAND, LEFT 05/03/2010  . Cellulitis of unspecified part of limb 05/03/2010  . Unspecified contact dermatitis due to plants, except food 01/12/2010  . Sprain of joints and ligaments of unspecified parts of neck, initial encounter 01/03/2010  . LUNG NODULE 07/17/2009  . Chest pain 07/13/2009  . UNSPECIFIED PROSTATITIS 06/15/2009  . Alcohol abuse 05/15/2009  . Alcohol withdrawal ([place]) 04/24/2009  . Impaired fasting glucose 11/04/2007  . Gout 01/02/2007  . Essential hypertension 01/02/2007    Anastaisa Wooding PT, DPT 12/01/2019, 11:24 AM  [place] MAIN [facility] SERVICES [address] [place], [place], [address] Phone: [phone]   Fax:  [phone]  Name: ZOHAN EMERT MRN: [id number] Date of Birth: 1946/02/06

## 2019-12-01 ENCOUNTER — Ambulatory Visit: Payer: Medicare Other | Admitting: Physical Therapy

## 2019-12-02 ENCOUNTER — Other Ambulatory Visit: Payer: Self-pay

## 2019-12-02 ENCOUNTER — Ambulatory Visit: Payer: Medicare Other | Attending: Neurology

## 2019-12-02 DIAGNOSIS — R293 Abnormal posture: Secondary | ICD-10-CM | POA: Diagnosis present

## 2019-12-02 DIAGNOSIS — M542 Cervicalgia: Secondary | ICD-10-CM | POA: Insufficient documentation

## 2019-12-02 NOTE — Therapy (Signed)
[place] MAIN [facility] SERVICES [address] [place], [place], [address] Phone: [phone]   Fax:  [phone]  Physical Therapy Treatment  Patient Details  Name: Carl [name] MRN: [id number] Date of Birth: Nov 17, 1945 Referring Provider (PT): Dr. Manuella Ghazi   Encounter Date: 12/02/2019  PT End of Session - 12/02/19 1108    Visit Number  5    Number of Visits  9    Date for PT Re-Evaluation  12/14/19    Authorization Type  medicare    PT Start Time  1102    PT Stop Time  1145    PT Time Calculation (min)  43 min    Activity Tolerance  Patient tolerated treatment well    Behavior During Therapy  [facility] for tasks assessed/performed       Past Medical History:  Diagnosis Date  . Adenoma of colon   . Alcoholism ([place])    still drinking wine as of Feb 2013  . Anxiety and depression   . Cataract   . Cervical spondylosis without myelopathy   . Cervicalgia of occipito-atlanto-axial region   . Chronic pain disorder   . Degeneration of cervical intervertebral disc   . Degeneration, intervertebral disc, cervical   . Duodenal ulcer 2011  . Facet arthropathy, cervical   . Fuchs' corneal dystrophy   . Gastric ulcer 2011   egd at [facility] Aug 2011.  . GI bleed   . Gout   . Hypertension   . Impaired fasting glucose   . 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 LVH (left ventricular hypertrophy)   . Mild cognitive impairment   . Mitral insufficiency   . Neck pain   . Neuralgia and neuritis   . Nocturia   . Occipital neuralgia   . Osteoarthritis    neck  . Osteoarthritis   . Pure hypercholesterolemia   . Radiculitis   . Stroke ([place])   . Tricuspid insufficiency     Past Surgical History:  Procedure Laterality Date  . COLONOSCOPY WITH PROPOFOL N/A 06/05/2018   Procedure: COLONOSCOPY WITH PROPOFOL;  Surgeon: Manya Silvas, MD;  Location: [facility] ENDOSCOPY;  Service: Endoscopy;  Laterality: N/A;  .  CYSTOGRAM  03/08  . ESOPHAGOGASTRODUODENOSCOPY (EGD) WITH PROPOFOL N/A 06/20/2017   Procedure: ESOPHAGOGASTRODUODENOSCOPY (EGD) WITH PROPOFOL;  Surgeon: Lucilla Lame, MD;  Location[facility] ENDOSCOPY;  Service: Endoscopy;  Laterality: N/A;    There were no vitals filed for this visit.  Subjective Assessment - 12/02/19 1106    Subjective  Patient reports that he did not notice any improvement after the dry needling from last session. He was more sore yesterday and states that overall he feels worse compared to before. Reports adherence with HEP.    Pertinent History  74 yo male reports chronic neck/headache on left side since 2014-2015. He reports having good strength in BUE but states that the pain is so severe that it does interfere with cognition and concentration. He reports pain is constant. He has tried multiple conservative treatment including medications, chiropractor (only temporary relief, stopped in Sprin 2020), steroid injections with no relief. Dr. Manuella Ghazi recently prescribed verampamil which he states has helped some. He is still requiring over the counter medication for pain control.  PMH significant: CVA Feb 2013, some vision loss in right eye from retina bleed in 1990's, HTN (controlled), OA, cervical spondylosis without radiculopathy, former smoker (quit in 1990's)    Limitations  Other (comment);Reading;Writing  yard work   How long can you sit comfortably?  NA    How long can you stand comfortably?  NA    How long can you walk comfortably?  NA    Diagnostic tests  X-ray in March 2020- multiple level cervical spondylosis with loss of disc height especially C3-C4;    Patient Stated Goals  "get rid of pain so that I can do what I would like to do."    Currently in Pain?  Yes    Pain Score  6     Pain Location  Head    Pain Orientation  Left;Posterior    Pain Descriptors / Indicators  Headache    Pain Type  Chronic pain    Pain Onset  More than a month ago    Pain Frequency   Constant          TREATMENT   Patient prone: PT applied moist heat to cervical/thoracic paraspinals in prone position;   Manual Therapy  Supine: PT performed PROM for increased cervical flexibility and to help reduce pain: PT performed suboccipital release 30 sec hold, 10 sec rest x5 min;  PT performed passive upper trap stretch and scalene stretch for the left side 30 sec hold x 2 reps each; PT performed gentle cervical distraction 20 sec hold, 10 sec rest x5 min; He reports slight discomfort in cervical spine with distraction but then relief of symptoms including slight relief in headache during relaxation;  Prone PT performed soft tissue massage to upper cervical paraspinals including suboccipitals (L>R) x5 min; PT performed grade II-III PA mobs at T1, T2, T3, T4, T5, T6, 15 sec bouts x 2 sets each, Improved mobility noted during 2nd bout with less pain/discomfort reported;   Ther-ex  Prone with 2# handweight: BUE shoulder extension x 12 reps; BUE low row x 12 reps BUE mid row x 12 reps; Patient required min-moderate verbal/tactile cues for correct exercise technique.   Pt reports increase in pain followed dry needling from last session so not repeated today. Continued with soft tissue mobilizations as well as spinal mobilizations. Pt reports that he feels improvement in his neck pain when he perform strengthening. Introduced some therapeutic neuroscience pain education into session to judge patient's responsiveness. Pt encouraged to continue HEP. Pt will benefit from PT services to address deficits in neck pain in order to return to full function at home.                        PT Short Term Goals - 11/16/19 1219      PT SHORT TERM GOAL #1   Title  Patient will be adherent to HEP at least 3x a week to improve functional strength and balance for better safety at home.    Time  2    Period  Weeks    Status  New    Target Date  11/30/19         PT Long Term Goals - 11/16/19 1219      PT LONG TERM GOAL #1   Title  Patient will exhibit improved postural support with erect head in sitting/standing at least 50+ % of time to improve safety awareness with activities and reduce neck discomfort    Time  4    Period  Weeks    Status  New    Target Date  12/14/19      PT LONG TERM GOAL #2   Title  Patient will  improve FOTO score to >55/100 exhibiting improved mobility with less pain with ADLs.    Time  4    Period  Weeks    Status  New    Target Date  12/14/19      PT LONG TERM GOAL #3   Title  Patient will reports a worst pain of 5/10 in head over last 2 weeks to exhibit improved symptoms with ADLs.    Baseline  worst pain 8/10    Time  4    Period  Weeks    Status  New    Target Date  12/14/19            Plan - 12/02/19 1109    Clinical Impression Statement  Pt reports increase in pain followed dry needling from last session so not repeated today. Continued with soft tissue mobilizations as well as spinal mobilizations. Pt reports that he feels improvement in his neck pain when he perform strengthening. Introduced some therapeutic neuroscience pain education into session to judge patient's responsiveness. Pt encouraged to continue HEP. Pt will benefit from PT services to address deficits in neck pain in order to return to full function at home.    Personal Factors and Comorbidities  Comorbidity 3+;Past/Current Experience;Time since onset of injury/illness/exacerbation    Comorbidities  CVA Feb 2013, some vision loss in right eye from retina bleed in 1990's, HTN (controlled), OA, cervical spondylosis without radiculopathy, former smoker (quit in 1990's)    Examination-Activity Limitations  Caring for Others;Carry;Lift;Other   difficulty concentrating/managing finances   Examination-Participation Restrictions  Cleaning;Community Activity;Medication Management;Personal Finances;Volunteer;Yard Work    Copy  Evolving/Moderate complexity    Rehab Potential  Fair    PT Frequency  2x / week    PT Duration  4 weeks    PT Treatment/Interventions  Cryotherapy;Electrical Stimulation;Moist Heat;Traction;Functional mobility training;Therapeutic activities;Therapeutic exercise;Patient/family education;Manual techniques;Passive range of motion;Dry needling;Energy conservation;Taping;Spinal Manipulations    PT Next Visit Plan  initiate HEP for postural re-education    PT Home Exercise Plan  will address next session    Consulted and Agree with Plan of Care  Patient       Patient will benefit from skilled therapeutic intervention in order to improve the following deficits and impairments:  Decreased mobility, Hypomobility, Improper body mechanics, Decreased cognition, Decreased activity tolerance, Postural dysfunction, Pain  Visit Diagnosis: Cervicalgia  Abnormal posture     Problem List Patient Active Problem List   Diagnosis Date Noted  . Acute renal failure (ARF) ([place]) 04/02/2019  . Neurogenic pain 12/21/2018  . Cervical spondylosis with radiculopathy 10/13/2018  . Melena 09/15/2018  . Acute esophagogastric ulcer 09/15/2018  . Duodenum ulcer 09/15/2018  . 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 Chronic neck pain (Primary Area of Pain) (Left) 07/09/2018  . Chronic pain syndrome 07/09/2018  . Pharmacologic therapy 07/09/2018  . Disorder of skeletal system 07/09/2018  . Problems influencing health status 07/09/2018  . Chronic facial pain (Secondary Area of Pain) (Left) 07/09/2018  . Cervicalgia (Left) 07/09/2018  . Abnormal behavior 04/01/2018  . Socially inappropriate behavior 04/01/2018  . History of adenomatous polyp of colon 03/20/2018  . History of upper gastrointestinal bleeding 03/20/2018  . GIB (gastrointestinal bleeding) 06/19/2017  . Occipital neuralgia (Left) 03/04/2017  . GI bleed 03/03/2017  . History of stroke 03/03/2017  . Right sided weakness 03/03/2017  .  Slurred speech 03/03/2017  . Word finding difficulty 03/03/2017  . Opiate use 03/03/2017  . Chronic headaches 03/03/2017  . Chronic  knee pain (Bilateral) 03/03/2017  . 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 Cervical spondylosis without myelopathy 05/16/2015  . Cervicalgia of occipito-atlanto-axial region (Left) 02/09/2015  . 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 Chronic pain disorder 09/09/2013  . Neuralgia, neuritis, and radiculitis, unspecified 09/09/2013  . Cognitive disorder 06/23/2013  . Depression, major, single episode, moderate ([place]) 06/23/2013  . Mild cognitive impairment 06/23/2013  . Anxiety and depression 05/05/2013  . Cervical facet arthropathy (Left) 05/05/2013  . Spondylosis without myelopathy or radiculopathy, cervical region 05/05/2013  . Nocturia 04/22/2013  . LVH (left ventricular hypertrophy) 02/19/2013  . Mitral insufficiency 02/19/2013  . Pure hypercholesterolemia 02/19/2013  . Tricuspid insufficiency 02/19/2013  . Cataracts, bilateral 08/21/2012  . Fuchs' corneal dystrophy 08/21/2012  . Alcohol dependence in remission ([place]) 03/14/2011  . DDD (degenerative disc disease), cervical 03/14/2011  . Persistent insomnia 03/14/2011  . Osteoarthritis involving multiple joints 03/14/2011  . Primary generalized (osteo)arthritis 03/14/2011  . ANXIETY, SITUATIONAL 06/15/2010  . Situational anxiety 06/15/2010  . CELLULITIS, HAND, LEFT 05/03/2010  . Cellulitis of unspecified part of limb 05/03/2010  . Unspecified contact dermatitis due to plants, except food 01/12/2010  . Sprain of joints and ligaments of unspecified parts of neck, initial encounter 01/03/2010  . LUNG NODULE 07/17/2009  . Chest pain 07/13/2009  . UNSPECIFIED PROSTATITIS 06/15/2009  . Alcohol abuse 05/15/2009  . Alcohol withdrawal ([place]) 04/24/2009  . Impaired fasting glucose  11/04/2007  . Gout 01/02/2007  . Essential hypertension 01/02/2007   Phillips Grout PT, DPT, GCS  Georgi Navarrete 12/02/2019, 12:34 PM  [place] MAIN [facility] SERVICES [address] [place], [place], [address] Phone: [phone]   Fax:  [phone]  Name: HARALAMBOS DEMIRJIAN MRN: [id number] Date of Birth: [birth year]

## 2019-12-06 ENCOUNTER — Ambulatory Visit: Payer: Medicare Other | Admitting: Physical Therapy

## 2019-12-07 ENCOUNTER — Ambulatory Visit: Payer: Medicare Other | Admitting: Physical Therapy

## 2019-12-08 ENCOUNTER — Ambulatory Visit: Payer: Medicare Other | Admitting: Physical Therapy

## 2019-12-09 ENCOUNTER — Other Ambulatory Visit: Payer: Self-pay

## 2019-12-09 ENCOUNTER — Encounter: Payer: Self-pay | Admitting: Physical Therapy

## 2019-12-09 ENCOUNTER — Ambulatory Visit: Payer: Medicare Other | Admitting: Physical Therapy

## 2019-12-09 DIAGNOSIS — M542 Cervicalgia: Secondary | ICD-10-CM

## 2019-12-09 DIAGNOSIS — R293 Abnormal posture: Secondary | ICD-10-CM

## 2019-12-09 NOTE — Therapy (Signed)
[place] MAIN [facility] SERVICES [address] [place], [place], [address] Phone: [phone]   Fax:  [phone]  Physical Therapy Treatment  Patient Details  Name: Carl [name] MRN: [id number] Date of Birth: 05-19-1946 Referring Provider (PT): Dr. Manuella Ghazi   Encounter Date: 12/09/2019  PT End of Session - 12/09/19 1147    Visit Number  6    Number of Visits  9    Date for PT Re-Evaluation  12/14/19    Authorization Type  medicare    PT Start Time  1105    PT Stop Time  1145    PT Time Calculation (min)  40 min    Activity Tolerance  Patient tolerated treatment well    Behavior During Therapy  [facility] for tasks assessed/performed       Past Medical History:  Diagnosis Date  . Adenoma of colon   . Alcoholism ([place])    still drinking wine as of Feb 2013  . Anxiety and depression   . Cataract   . Cervical spondylosis without myelopathy   . Cervicalgia of occipito-atlanto-axial region   . Chronic pain disorder   . Degeneration of cervical intervertebral disc   . Degeneration, intervertebral disc, cervical   . Duodenal ulcer 2011  . Facet arthropathy, cervical   . Fuchs' corneal dystrophy   . Gastric ulcer 2011   egd at [place] Aug 2011.  . GI bleed   . Gout   . Hypertension   . Impaired fasting glucose   . 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 LVH (left ventricular hypertrophy)   . Mild cognitive impairment   . Mitral insufficiency   . Neck pain   . Neuralgia and neuritis   . Nocturia   . Occipital neuralgia   . Osteoarthritis    neck  . Osteoarthritis   . Pure hypercholesterolemia   . Radiculitis   . Stroke ([place])   . Tricuspid insufficiency     Past Surgical History:  Procedure Laterality Date  . COLONOSCOPY WITH PROPOFOL N/A 06/05/2018   Procedure: COLONOSCOPY WITH PROPOFOL;  Surgeon: Manya Silvas, MD;  Location: [facility] ENDOSCOPY;  Service: Endoscopy;  Laterality: N/A;  .  CYSTOGRAM  03/08  . ESOPHAGOGASTRODUODENOSCOPY (EGD) WITH PROPOFOL N/A 06/20/2017   Procedure: ESOPHAGOGASTRODUODENOSCOPY (EGD) WITH PROPOFOL;  Surgeon: Lucilla Lame, MD;  Location: [facility] ENDOSCOPY;  Service: Endoscopy;  Laterality: N/A;    There were no vitals filed for this visit.  Subjective Assessment - 12/09/19 1115    Subjective  Patient reports increased neck/head pain over last week. He reports adherence with HEP; He reports not sleeping well last night;    Pertinent History  74 yo male reports chronic neck/headache on left side since 2014-2015. He reports having good strength in BUE but states that the pain is so severe that it does interfere with cognition and concentration. He reports pain is constant. He has tried multiple conservative treatment including medications, chiropractor (only temporary relief, stopped in Spr[place] 2020), steroid injections with no relief. Dr. Manuella Ghazi recently prescribed verampamil which he states has helped some. He is still requiring over the counter medication for pain control.  PMH significant: CVA Feb 2013, some vision loss in right eye from retina bleed in 1990's, HTN (controlled), OA, cervical spondylosis without radiculopathy, former smoker (quit in 1990's)    Limitations  Other (comment);Reading;Writing   yard work   How long can you sit comfortably?  NA  How long can you stand comfortably?  NA    How long can you walk comfortably?  NA    Diagnostic tests  X-ray in March 2020- multiple level cervical spondylosis with loss of disc height especially C3-C4;    Patient Stated Goals  "get rid of pain so that I can do what I would like to do."    Currently in Pain?  Yes    Pain Score  6     Pain Location  Head    Pain Orientation  Left;Posterior    Pain Descriptors / Indicators  Headache    Pain Type  Chronic pain    Pain Onset  More than a month ago    Pain Frequency  Constant    Aggravating Factors   worse without medication    Pain Relieving Factors   medication    Effect of Pain on Daily Activities  decreased activity tolerance;            TREATMENT: Patient supine with moist heat to cervical and thoracic paraspinals, concurrent with exercise/PROM:  PT performed PROM for increased cervical flexibility and to help reduce pain:  PT performed passive upper trap stretch left side 30 sec hold x2 reps;  PT performed passive lateral translation of occiput left/right x5 reps x2 sets PT performed grade II-III lateral mobs C2/C1 left to right 15 sec bouts x3 sets PT performed suboccipital release 30 sec hold, 10 sec rest x5 min;   Patient does report increased tenderness at left occiput and suboccipital muscles. Moderate tightness noted in left cervical paraspinals. He also reports pain along left C3 level parapsinals and along C3 nerve root. PT performed soft/deep tissue massage including myofascial release to left cervical paraspinals x10 min;   PT performed gentle cervical distraction 20 sec hold, 10 sec rest x5 min; He reports slight discomfort in cervical spine with distraction but then relief of symptoms including slight relief in headache during relaxation;    Patient prone: PT performed soft tissue massage to upper cervical paraspinals including suboccipitals (L>R) x5 min;  PT performed grade II-III PA mobs at T1, T2, T3, T4, T5, T6, 15 sec bouts x2 sets each; Increased hypomobility noted at T1, T2, T3 with better mobility noted at T4-T6; Improved mobility noted during 2nd bout with less pain/discomfort reported;   Response to treatment: Patient reports feeling mildly better at end of session.  Pt verbalized understanding of HEP;he reports continued headache at 6/10 at end of session;                       PT Education - 12/09/19 1146    Education Details  manual therapy/posture    Person(s) Educated  Patient    Methods  Explanation;Verbal cues    Comprehension  Verbalized understanding;Returned  demonstration;Verbal cues required;Need further instruction       PT Short Term Goals - 11/16/19 1219      PT SHORT TERM GOAL #1   Title  Patient will be adherent to HEP at least 3x a week to improve functional strength and balance for better safety at home.    Time  2    Period  Weeks    Status  New    Target Date  11/30/19        PT Long Term Goals - 11/16/19 1219      PT LONG TERM GOAL #1   Title  Patient will exhibit improved postural support with erect head in  sitting/standing at least 50+ % of time to improve safety awareness with activities and reduce neck discomfort    Time  4    Period  Weeks    Status  New    Target Date  12/14/19      PT LONG TERM GOAL #2   Title  Patient will improve FOTO score to >55/100 exhibiting improved mobility with less pain with ADLs.    Time  4    Period  Weeks    Status  New    Target Date  12/14/19      PT LONG TERM GOAL #3   Title  Patient will reports a worst pain of 5/10 in head over last 2 weeks to exhibit improved symptoms with ADLs.    Baseline  worst pain 8/10    Time  4    Period  Weeks    Status  New    Target Date  12/14/19            Plan - 12/09/19 1155    Clinical Impression Statement  Patient exhibits increased tightness in left cervical paraspinals this session. PT performed extensive manual therapy to help improve tissue extensibility including soft tissue massage, suboccipital release and passive stretches. Patient reports moderate tenderness especially along left suboccipitals. He was able to exhibit less tightness at end of session but continues to have headache and high levels of pain. PT will asses goals next week to address progress.    Personal Factors and Comorbidities  Comorbidity 3+;Past/Current Experience;Time since onset of injury/illness/exacerbation    Comorbidities  CVA Feb 2013, some vision loss in right eye from retina bleed in 1990's, HTN (controlled), OA, cervical spondylosis without  radiculopathy, former smoker (quit in 1990's)    Examination-Activity Limitations  Caring for Others;Carry;Lift;Other   difficulty concentrating/managing finances   Examination-Participation Restrictions  Cleaning;Community Activity;Medication Management;Personal Finances;Volunteer;Yard Work    [employer] officer  Evolving/Moderate complexity    Rehab Potential  Fair    PT Frequency  2x / week    PT Duration  4 weeks    PT Treatment/Interventions  Cryotherapy;Electrical Stimulation;Moist Heat;Traction;Functional mobility training;Therapeutic activities;Therapeutic exercise;Patient/family education;Manual techniques;Passive range of motion;Dry needling;Energy conservation;Taping;Spinal Manipulations    PT Next Visit Plan  initiate HEP for postural re-education    PT Home Exercise Plan  will address next session    Consulted and Agree with Plan of Care  Patient       Patient will benefit from skilled therapeutic intervention in order to improve the following deficits and impairments:  Decreased mobility, Hypomobility, Improper body mechanics, Decreased cognition, Decreased activity tolerance, Postural dysfunction, Pain  Visit Diagnosis: Cervicalgia  Abnormal posture     Problem List Patient Active Problem List   Diagnosis Date Noted  . Acute renal failure (ARF) ([place]) 04/02/2019  . Neurogenic pain 12/21/2018  . Cervical spondylosis with radiculopathy 10/13/2018  . Melena 09/15/2018  . Acute esophagogastric ulcer 09/15/2018  . Duodenum ulcer 09/15/2018  . 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 Chronic neck pain (Primary Area of Pain) (Left) 07/09/2018  . Chronic pain syndrome 07/09/2018  . Pharmacologic therapy 07/09/2018  . Disorder of skeletal system 07/09/2018  . Problems influencing health status 07/09/2018  . Chronic facial pain (Secondary Area of Pain) (Left) 07/09/2018  . Cervicalgia (Left) 07/09/2018  . Abnormal behavior 04/01/2018  . Socially inappropriate  behavior 04/01/2018  . History of adenomatous polyp of colon 03/20/2018  . History of upper gastrointestinal bleeding 03/20/2018  . GIB (gastrointestinal bleeding)  06/19/2017  . Occipital neuralgia (Left) 03/04/2017  . GI bleed 03/03/2017  . History of stroke 03/03/2017  . Right sided weakness 03/03/2017  . Slurred speech 03/03/2017  . Word finding difficulty 03/03/2017  . Opiate use 03/03/2017  . Chronic headaches 03/03/2017  . Chronic knee pain (Bilateral) 03/03/2017  . 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 Cervical spondylosis without myelopathy 05/16/2015  . Cervicalgia of occipito-atlanto-axial region (Left) 02/09/2015  . 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 Chronic pain disorder 09/09/2013  . Neuralgia, neuritis, and radiculitis, unspecified 09/09/2013  . Cognitive disorder 06/23/2013  . Depression, major, single episode, moderate ([place]) 06/23/2013  . Mild cognitive impairment 06/23/2013  . Anxiety and depression 05/05/2013  . Cervical facet arthropathy (Left) 05/05/2013  . Spondylosis without myelopathy or radiculopathy, cervical region 05/05/2013  . Nocturia 04/22/2013  . LVH (left ventricular hypertrophy) 02/19/2013  . Mitral insufficiency 02/19/2013  . Pure hypercholesterolemia 02/19/2013  . Tricuspid insufficiency 02/19/2013  . Cataracts, bilateral 08/21/2012  . Fuchs' corneal dystrophy 08/21/2012  . Alcohol dependence in remission ([place]) 03/14/2011  . DDD (degenerative disc disease), cervical 03/14/2011  . Persistent insomnia 03/14/2011  . Osteoarthritis involving multiple joints 03/14/2011  . Primary generalized (osteo)arthritis 03/14/2011  . ANXIETY, SITUATIONAL 06/15/2010  . Situational anxiety 06/15/2010  . CELLULITIS, HAND, LEFT 05/03/2010  . Cellulitis of unspecified part of limb 05/03/2010  . Unspecified contact dermatitis due to  plants, except food 01/12/2010  . Sprain of joints and ligaments of unspecified parts of neck, initial encounter 01/03/2010  . LUNG NODULE 07/17/2009  . Chest pain 07/13/2009  . UNSPECIFIED PROSTATITIS 06/15/2009  . Alcohol abuse 05/15/2009  . Alcohol withdrawal ([place]) 04/24/2009  . Impaired fasting glucose 11/04/2007  . Gout 01/02/2007  . Essential hypertension 01/02/2007    Dovber Ernest PT, DPT 12/09/2019, 12:05 PM  [place] MAIN [facility] SERVICES [address] [place], [place], [address] Phone: [phone]   Fax:  [phone]  Name: SHABAZZ WEIMAN MRN: [id number] Date of Birth: 1946/05/11

## 2019-12-13 ENCOUNTER — Ambulatory Visit: Payer: Medicare Other | Admitting: Physical Therapy

## 2019-12-14 ENCOUNTER — Other Ambulatory Visit: Payer: Self-pay

## 2019-12-14 ENCOUNTER — Encounter: Payer: Self-pay | Admitting: Physical Therapy

## 2019-12-14 ENCOUNTER — Ambulatory Visit: Payer: Medicare Other | Admitting: Physical Therapy

## 2019-12-14 DIAGNOSIS — M542 Cervicalgia: Secondary | ICD-10-CM

## 2019-12-14 DIAGNOSIS — R293 Abnormal posture: Secondary | ICD-10-CM

## 2019-12-14 NOTE — Therapy (Signed)
[place] MAIN [facility] SERVICES [address] [place], [place], [address] Phone: [phone]   Fax:  [phone]  Physical Therapy Treatment  Patient Details  Name: Carl [name] MRN: [id number] Date of Birth: 1946-03-09 Referring Provider (PT): Dr. Manuella Ghazi   Encounter Date: 12/14/2019  PT End of Session - 12/14/19 1158    Visit Number  7    Number of Visits  9    Date for PT Re-Evaluation  12/14/19    Authorization Type  medicare    PT Start Time  1102    PT Stop Time  1145    PT Time Calculation (min)  43 min    Activity Tolerance  Patient tolerated treatment well    Behavior During Therapy  [facility] for tasks assessed/performed       Past Medical History:  Diagnosis Date  . Adenoma of colon   . Alcoholism ([place])    still drinking wine as of Feb 2013  . Anxiety and depression   . Cataract   . Cervical spondylosis without myelopathy   . Cervicalgia of occipito-atlanto-axial region   . Chronic pain disorder   . Degeneration of cervical intervertebral disc   . Degeneration, intervertebral disc, cervical   . Duodenal ulcer 2011  . Facet arthropathy, cervical   . Fuchs' corneal dystrophy   . Gastric ulcer 2011   egd at [place] Aug 2011.  . GI bleed   . Gout   . Hypertension   . Impaired fasting glucose   . 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 LVH (left ventricular hypertrophy)   . Mild cognitive impairment   . Mitral insufficiency   . Neck pain   . Neuralgia and neuritis   . Nocturia   . Occipital neuralgia   . Osteoarthritis    neck  . Osteoarthritis   . Pure hypercholesterolemia   . Radiculitis   . Stroke ([place])   . Tricuspid insufficiency     Past Surgical History:  Procedure Laterality Date  . COLONOSCOPY WITH PROPOFOL N/A 06/05/2018   Procedure: COLONOSCOPY WITH PROPOFOL;  Surgeon: Manya Silvas, MD;  Location: [facility] ENDOSCOPY;  Service: Endoscopy;  Laterality: N/A;   . CYSTOGRAM  03/08  . ESOPHAGOGASTRODUODENOSCOPY (EGD) WITH PROPOFOL N/A 06/20/2017   Procedure: ESOPHAGOGASTRODUODENOSCOPY (EGD) WITH PROPOFOL;  Surgeon: Lucilla Lame, MD;  Location: [facility] ENDOSCOPY;  Service: Endoscopy;  Laterality: N/A;    There were no vitals filed for this visit.  Subjective Assessment - 12/14/19 1113    Subjective  Patient reports feeling mildly better since last session. He feels that working on lower cervical paraspinals is helping relieve headaches;    Pertinent History  74 yo male reports chronic neck/headache on left side since 2014-2015. He reports having good strength in BUE but states that the pain is so severe that it does interfere with cognition and concentration. He reports pain is constant. He has tried multiple conservative treatment including medications, chiropractor (only temporary relief, stopped in [place] 2020), steroid injections with no relief. Dr. Manuella Ghazi recently prescribed verampamil which he states has helped some. He is still requiring over the counter medication for pain control.  PMH significant: CVA Feb 2013, some vision loss in right eye from retina bleed in 1990's, HTN (controlled), OA, cervical spondylosis without radiculopathy, former smoker (quit in 1990's)    Limitations  Other (comment);Reading;Writing   yard work   How long can you sit comfortably?  NA  How long can you stand comfortably?  NA    How long can you walk comfortably?  NA    Diagnostic tests  X-ray in March 2020- multiple level cervical spondylosis with loss of disc height especially C3-C4;    Patient Stated Goals  "get rid of pain so that I can do what I would like to do."    Currently in Pain?  Yes    Pain Score  5     Pain Location  Head    Pain Orientation  Left;Posterior    Pain Descriptors / Indicators  Headache    Pain Type  Chronic pain    Pain Onset  More than a month ago    Pain Frequency  Constant    Aggravating Factors   worse without medication    Pain  Relieving Factors  medication    Effect of Pain on Daily Activities  decreased activity tolerance;    Multiple Pain Sites  No           TREATMENT: Patient prone with pillow under stomach for better comfort: PT applied moist heat to cervical/thoracic paraspinals in prone position;  Patient prone: PT performed grade II-III PA mobs at T1, T2, T3, T4, T5, T6, 15 sec bouts x2 sets each; Increased hypomobility noted at T4-T6 with better mobility noted at T-T3; Improved mobility noted during 2nd bout with less pain/discomfort reported;  PT performed soft tissue massage to upper cervical paraspinals including suboccipitals and lower cervical paraspinals including upper trap/levator scapulae (L>R) x15 min;   Prone with 2# handweight: -BUE low row 2x12 -BUE mid row x10 reps; Patient required min-moderate verbal/tactile cues for correct exercise technique.  He did have increased difficulty with low/mid row reporting increased fatigue    Response to treatment: Patient reports feeling mildly better at end of session.He reports change in pain with thoracic/lower cervical mobilization. This session he exhibited increased stiffness in mid thoracic spine with better mobility in upper thoracic spine. He responded well to cues for postural strengthening.   Pt verbalized understanding of HEP;                       PT Short Term Goals - 11/16/19 1219      PT SHORT TERM GOAL #1   Title  Patient will be adherent to HEP at least 3x a week to improve functional strength and balance for better safety at home.    Time  2    Period  Weeks    Status  New    Target Date  11/30/19        PT Long Term Goals - 11/16/19 1219      PT LONG TERM GOAL #1   Title  Patient will exhibit improved postural support with erect head in sitting/standing at least 50+ % of time to improve safety awareness with activities and reduce neck discomfort    Time  4    Period  Weeks    Status  New     Target Date  12/14/19      PT LONG TERM GOAL #2   Title  Patient will improve FOTO score to >55/100 exhibiting improved mobility with less pain with ADLs.    Time  4    Period  Weeks    Status  New    Target Date  12/14/19      PT LONG TERM GOAL #3   Title  Patient will reports a worst pain of  5/10 in head over last 2 weeks to exhibit improved symptoms with ADLs.    Baseline  worst pain 8/10    Time  4    Period  Weeks    Status  New    Target Date  12/14/19            Plan - 12/14/19 1159    Clinical Impression Statement  Patient is progressing fair. He exhibits less stiffness in lower cervical/upper thoracic this session with increased tightness along mid thoracic spine. Patient responded well to manual therapy reporting less tendernes and less soreness. He is still challenged with prone postural strengthening exercise. He responded well to cues with better positioning and exercise tchnique. Patient would benefit from additional skilled PT intervention to improve strength and postural control while reducing neck pain;    Personal Factors and Comorbidities  Comorbidity 3+;Past/Current Experience;Time since onset of injury/illness/exacerbation    Comorbidities  CVA Feb 2013, some vision loss in right eye from retina bleed in 1990's, HTN (controlled), OA, cervical spondylosis without radiculopathy, former smoker (quit in 1990's)    Examination-Activity Limitations  Caring for Others;Carry;Lift;Other   difficulty concentrating/managing finances   Examination-Participation Restrictions  Cleaning;Community Activity;Medication Management;Personal Finances;Volunteer;Yard Work    [employer] officer  Evolving/Moderate complexity    Rehab Potential  Fair    PT Frequency  2x / week    PT Duration  4 weeks    PT Treatment/Interventions  Cryotherapy;Electrical Stimulation;Moist Heat;Traction;Functional mobility training;Therapeutic activities;Therapeutic  exercise;Patient/family education;Manual techniques;Passive range of motion;Dry needling;Energy conservation;Taping;Spinal Manipulations    PT Next Visit Plan  initiate HEP for postural re-education    PT Home Exercise Plan  will address next session    Consulted and Agree with Plan of Care  Patient       Patient will benefit from skilled therapeutic intervention in order to improve the following deficits and impairments:  Decreased mobility, Hypomobility, Improper body mechanics, Decreased cognition, Decreased activity tolerance, Postural dysfunction, Pain  Visit Diagnosis: Cervicalgia  Abnormal posture     Problem List Patient Active Problem List   Diagnosis Date Noted  . Acute renal failure (ARF) ([place]) 04/02/2019  . Neurogenic pain 12/21/2018  . Cervical spondylosis with radiculopathy 10/13/2018  . Melena 09/15/2018  . Acute esophagogastric ulcer 09/15/2018  . Duodenum ulcer 09/15/2018  . 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 Chronic neck pain (Primary Area of Pain) (Left) 07/09/2018  . Chronic pain syndrome 07/09/2018  . Pharmacologic therapy 07/09/2018  . Disorder of skeletal system 07/09/2018  . Problems influencing health status 07/09/2018  . Chronic facial pain (Secondary Area of Pain) (Left) 07/09/2018  . Cervicalgia (Left) 07/09/2018  . Abnormal behavior 04/01/2018  . Socially inappropriate behavior 04/01/2018  . History of adenomatous polyp of colon 03/20/2018  . History of upper gastrointestinal bleeding 03/20/2018  . GIB (gastrointestinal bleeding) 06/19/2017  . Occipital neuralgia (Left) 03/04/2017  . GI bleed 03/03/2017  . History of stroke 03/03/2017  . Right sided weakness 03/03/2017  . Slurred speech 03/03/2017  . Word finding difficulty 03/03/2017  . Opiate use 03/03/2017  . Chronic headaches 03/03/2017  . Chronic knee pain (Bilateral) 03/03/2017  . 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 Adjustment disorder with mixed disturbance of emotions and conduct  12/06/2016  . Cervical spondylosis without myelopathy 05/16/2015  . Cervicalgia of occipito-atlanto-axial region (Left) 02/09/2015  . 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 Chronic pain disorder 09/09/2013  . Neuralgia, neuritis, and radiculitis, unspecified 09/09/2013  .  Cognitive disorder 06/23/2013  . Depression, major, single episode, moderate ([place]) 06/23/2013  . Mild cognitive impairment 06/23/2013  . Anxiety and depression 05/05/2013  . Cervical facet arthropathy (Left) 05/05/2013  . Spondylosis without myelopathy or radiculopathy, cervical region 05/05/2013  . Nocturia 04/22/2013  . LVH (left ventricular hypertrophy) 02/19/2013  . Mitral insufficiency 02/19/2013  . Pure hypercholesterolemia 02/19/2013  . Tricuspid insufficiency 02/19/2013  . Cataracts, bilateral 08/21/2012  . Fuchs' corneal dystrophy 08/21/2012  . Alcohol dependence in remission ([place]) 03/14/2011  . DDD (degenerative disc disease), cervical 03/14/2011  . Persistent insomnia 03/14/2011  . Osteoarthritis involving multiple joints 03/14/2011  . Primary generalized (osteo)arthritis 03/14/2011  . ANXIETY, SITUATIONAL 06/15/2010  . Situational anxiety 06/15/2010  . CELLULITIS, HAND, LEFT 05/03/2010  . Cellulitis of unspecified part of limb 05/03/2010  . Unspecified contact dermatitis due to plants, except food 01/12/2010  . Sprain of joints and ligaments of unspecified parts of neck, initial encounter 01/03/2010  . LUNG NODULE 07/17/2009  . Chest pain 07/13/2009  . UNSPECIFIED PROSTATITIS 06/15/2009  . Alcohol abuse 05/15/2009  . Alcohol withdrawal ([place]) 04/24/2009  . Impaired fasting glucose 11/04/2007  . Gout 01/02/2007  . Essential hypertension 01/02/2007    Jarry Manon PT, DPT 12/14/2019, 12:00 PM  [place] MAIN [facility] SERVICES [address] [place], [place], [address] Phone: [phone]   Fax:   [phone]  Name: Carl [name] MRN: [id number] Date of Birth: Jul 28, 1946

## 2019-12-16 ENCOUNTER — Ambulatory Visit: Payer: Medicare Other | Admitting: Physical Therapy

## 2019-12-16 ENCOUNTER — Encounter: Payer: Self-pay | Admitting: Physical Therapy

## 2019-12-16 ENCOUNTER — Other Ambulatory Visit: Payer: Self-pay

## 2019-12-16 DIAGNOSIS — M542 Cervicalgia: Secondary | ICD-10-CM | POA: Diagnosis not present

## 2019-12-16 DIAGNOSIS — R293 Abnormal posture: Secondary | ICD-10-CM

## 2019-12-16 NOTE — Therapy (Signed)
[place] MAIN [facility] SERVICES [address] [place], [place], [address] Phone: [phone]   Fax:  [phone]  Physical Therapy Treatment  Patient Details  Name: Carl [name] MRN: [id number] Date of Birth: 09-23-1945 Referring Provider (PT): Dr. Manuella Ghazi   Encounter Date: 12/16/2019  PT End of Session - 12/16/19 1143    Visit Number  8    Number of Visits  17    Date for PT Re-Evaluation  01/13/20    Authorization Type  medicare    PT Start Time  1142    PT Stop Time  1230    PT Time Calculation (min)  48 min    Activity Tolerance  Patient tolerated treatment well    Behavior During Therapy  [facility] for tasks assessed/performed       Past Medical History:  Diagnosis Date  . Adenoma of colon   . Alcoholism ([place])    still drinking wine as of Feb 2013  . Anxiety and depression   . Cataract   . Cervical spondylosis without myelopathy   . Cervicalgia of occipito-atlanto-axial region   . Chronic pain disorder   . Degeneration of cervical intervertebral disc   . Degeneration, intervertebral disc, cervical   . Duodenal ulcer 2011  . Facet arthropathy, cervical   . Fuchs' corneal dystrophy   . Gastric ulcer 2011   egd at [place] Aug 2011.  . GI bleed   . Gout   . Hypertension   . Impaired fasting glucose   . 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 LVH (left ventricular hypertrophy)   . Mild cognitive impairment   . Mitral insufficiency   . Neck pain   . Neuralgia and neuritis   . Nocturia   . Occipital neuralgia   . Osteoarthritis    neck  . Osteoarthritis   . Pure hypercholesterolemia   . Radiculitis   . Stroke ([place])   . Tricuspid insufficiency     Past Surgical History:  Procedure Laterality Date  . COLONOSCOPY WITH PROPOFOL N/A 06/05/2018   Procedure: COLONOSCOPY WITH PROPOFOL;  Surgeon: Manya Silvas, MD;  Location: [facility] ENDOSCOPY;  Service: Endoscopy;  Laterality: N/A;   . CYSTOGRAM  03/08  . ESOPHAGOGASTRODUODENOSCOPY (EGD) WITH PROPOFOL N/A 06/20/2017   Procedure: ESOPHAGOGASTRODUODENOSCOPY (EGD) WITH PROPOFOL;  Surgeon: Lucilla Lame, MD;  Location: [facility] ENDOSCOPY;  Service: Endoscopy;  Laterality: N/A;    There were no vitals filed for this visit.  Subjective Assessment - 12/16/19 1150    Subjective  Patient reports increased head pain today at 8/10. He reports this started yesterday. No change in medication;    Pertinent History  74 yo male reports chronic neck/headache on left side since 2014-2015. He reports having good strength in BUE but states that the pain is so severe that it does interfere with cognition and concentration. He reports pain is constant. He has tried multiple conservative treatment including medications, chiropractor (only temporary relief, stopped in [place] 2020), steroid injections with no relief. Dr. Manuella Ghazi recently prescribed verampamil which he states has helped some. He is still requiring over the counter medication for pain control.  PMH significant: CVA Feb 2013, some vision loss in right eye from retina bleed in 1990's, HTN (controlled), OA, cervical spondylosis without radiculopathy, former smoker (quit in 1990's)    Limitations  Other (comment);Reading;Writing   yard work   How long can you sit comfortably?  NA    How  long can you stand comfortably?  NA    How long can you walk comfortably?  NA    Diagnostic tests  X-ray in March 2020- multiple level cervical spondylosis with loss of disc height especially C3-C4;    Patient Stated Goals  "get rid of pain so that I can do what I would like to do."    Currently in Pain?  Yes    Pain Score  8     Pain Location  Head    Pain Orientation  Left;Posterior    Pain Descriptors / Indicators  Headache    Pain Type  Chronic pain    Pain Onset  More than a month ago    Pain Frequency  Constant    Aggravating Factors   worse without medication    Pain Relieving Factors  medication     Effect of Pain on Daily Activities  decreased activity tolerance;         [facility] PT Assessment - 12/16/19 0001      Observation/Other Assessments   Focus on Therapeutic Outcomes (FOTO)   34/100      TREATMENT: Patient supine with moist heat to cervical paraspinals, concurrent with exercise/PROM:  PT performed PROM for increased cervical flexibility and to help reduce pain:  PT performed passive levator scapulae stretch left side 30 sec hold x2 reps; Patient denies any tightness with levator scapulae but did have tightness with upper trap stretch;  PT performed passive lateral translation of occiput left/right x5 reps x2 sets PT performed suboccipital release 30 sec hold, 10 sec rest x5 min;  Progressed to suboccipital release concurrent with chin tucks 5 sec hold x10 reps; Patient does report increased tenderness at left occiput and suboccipital muscles. Mild tightness noted although hard to palpate trigger points. He also reports pain along left C3 level parapsinals and along C3 nerve root.  PT performed gentle cervical distraction 20 sec hold, 10 sec rest x5 min; He reports slight discomfort in cervical spine with distraction but then relief of symptoms including slight relief in headache during relaxation;    PT instructed patient in [place] and other outcome measures to assess progress, see above;   Response to treatment: Patient reports feeling mildly better at end of session. 5/10 following manual therapy;  Although he admits its hard to rate pain as he is on a high regiment of pain medications to help cope with severe discomfort and therefore feels that his pain is dulled and has a hard time to determine change. Pt verbalized understanding of HEP;                        PT Education - 12/16/19 1448    Education Details  manual therapy, posture    Person(s) Educated  Patient    Methods  Explanation;Verbal cues    Comprehension  Verbalized  understanding;Returned demonstration;Verbal cues required;Need further instruction       PT Short Term Goals - 12/16/19 1143      PT SHORT TERM GOAL #1   Title  Patient will be adherent to HEP at least 3x a week to improve functional strength and balance for better safety at home.    Time  2    Period  Weeks    Status  Achieved    Target Date  11/30/19        PT Long Term Goals - 12/16/19 1143      PT LONG TERM GOAL #1  Title  Patient will exhibit improved postural support with erect head in sitting/standing at least 50+ % of time to improve safety awareness with activities and reduce neck discomfort    Time  4    Period  Weeks    Status  Partially Met    Target Date  01/13/20      PT LONG TERM GOAL #2   Title  Patient will improve FOTO score to >55/100 exhibiting improved mobility with less pain with ADLs.    Time  4    Period  Weeks    Status  Not Met    Target Date  01/13/20      PT LONG TERM GOAL #3   Title  Patient will reports a worst pain of 5/10 in head over last 2 weeks to exhibit improved symptoms with ADLs.    Baseline  worst pain 8/10    Time  4    Period  Weeks    Status  Not Met    Target Date  01/13/20            Plan - 12/16/19 1449    Clinical Impression Statement  Patient is progressing slowly. He reports increased pain this session. His pain does seem to improve with conservative treatment however this improvement is only temporary. Patient reports return of symptoms next day. Patient educated on chronic pain and need to desensitize hypersensitive nerves. Consider TENs unit for pain management; Will pursue TENs next few sessions and if beneficial consider getting home TENs for pain management. Patient verbalized understanding. He would benefit from additional skilled PT intervention to improve flexibility, postural control and reduce pain with ADLs    Personal Factors and Comorbidities  Comorbidity 3+;Past/Current Experience;Time since onset of  injury/illness/exacerbation    Comorbidities  CVA Feb 2013, some vision loss in right eye from retina bleed in 1990's, HTN (controlled), OA, cervical spondylosis without radiculopathy, former smoker (quit in 1990's)    Examination-Activity Limitations  Caring for Others;Carry;Lift;Other   difficulty concentrating/managing finances   Examination-Participation Restrictions  Cleaning;Community Activity;Medication Management;Personal Finances;Volunteer;Yard Work    [employer] officer  Evolving/Moderate complexity    Rehab Potential  Fair    PT Frequency  2x / week    PT Duration  4 weeks    PT Treatment/Interventions  Cryotherapy;Electrical Stimulation;Moist Heat;Traction;Functional mobility training;Therapeutic activities;Therapeutic exercise;Patient/family education;Manual techniques;Passive range of motion;Dry needling;Energy conservation;Taping;Spinal Manipulations    PT Next Visit Plan  initiate HEP for postural re-education    PT Home Exercise Plan  will address next session    Consulted and Agree with Plan of Care  Patient       Patient will benefit from skilled therapeutic intervention in order to improve the following deficits and impairments:  Decreased mobility, Hypomobility, Improper body mechanics, Decreased cognition, Decreased activity tolerance, Postural dysfunction, Pain  Visit Diagnosis: Cervicalgia  Abnormal posture     Problem List Patient Active Problem List   Diagnosis Date Noted  . Acute renal failure (ARF) ([place]) 04/02/2019  . Neurogenic pain 12/21/2018  . Cervical spondylosis with radiculopathy 10/13/2018  . Melena 09/15/2018  . Acute esophagogastric ulcer 09/15/2018  . Duodenum ulcer 09/15/2018  . 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 Chronic neck pain (Primary Area of Pain) (Left) 07/09/2018  . Chronic pain syndrome 07/09/2018  . Pharmacologic therapy 07/09/2018  . Disorder of skeletal system 07/09/2018  . Problems influencing health status  07/09/2018  . Chronic facial pain (Secondary Area of Pain) (Left) 07/09/2018  . Cervicalgia (Left)  07/09/2018  . Abnormal behavior 04/01/2018  . Socially inappropriate behavior 04/01/2018  . History of adenomatous polyp of colon 03/20/2018  . History of upper gastrointestinal bleeding 03/20/2018  . GIB (gastrointestinal bleeding) 06/19/2017  . Occipital neuralgia (Left) 03/04/2017  . GI bleed 03/03/2017  . History of stroke 03/03/2017  . Right sided weakness 03/03/2017  . Slurred speech 03/03/2017  . Word finding difficulty 03/03/2017  . Opiate use 03/03/2017  . Chronic headaches 03/03/2017  . Chronic knee pain (Bilateral) 03/03/2017  . 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 Cervical spondylosis without myelopathy 05/16/2015  . Cervicalgia of occipito-atlanto-axial region (Left) 02/09/2015  . 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 Chronic pain disorder 09/09/2013  . Neuralgia, neuritis, and radiculitis, unspecified 09/09/2013  . Cognitive disorder 06/23/2013  . Depression, major, single episode, moderate ([place]) 06/23/2013  . Mild cognitive impairment 06/23/2013  . Anxiety and depression 05/05/2013  . Cervical facet arthropathy (Left) 05/05/2013  . Spondylosis without myelopathy or radiculopathy, cervical region 05/05/2013  . Nocturia 04/22/2013  . LVH (left ventricular hypertrophy) 02/19/2013  . Mitral insufficiency 02/19/2013  . Pure hypercholesterolemia 02/19/2013  . Tricuspid insufficiency 02/19/2013  . Cataracts, bilateral 08/21/2012  . Fuchs' corneal dystrophy 08/21/2012  . Alcohol dependence in remission ([place]) 03/14/2011  . DDD (degenerative disc disease), cervical 03/14/2011  . Persistent insomnia 03/14/2011  . Osteoarthritis involving multiple joints 03/14/2011  . Primary generalized (osteo)arthritis 03/14/2011  . ANXIETY, SITUATIONAL  06/15/2010  . Situational anxiety 06/15/2010  . CELLULITIS, HAND, LEFT 05/03/2010  . Cellulitis of unspecified part of limb 05/03/2010  . Unspecified contact dermatitis due to plants, except food 01/12/2010  . Sprain of joints and ligaments of unspecified parts of neck, initial encounter 01/03/2010  . LUNG NODULE 07/17/2009  . Chest pain 07/13/2009  . UNSPECIFIED PROSTATITIS 06/15/2009  . Alcohol abuse 05/15/2009  . Alcohol withdrawal ([place]) 04/24/2009  . Impaired fasting glucose 11/04/2007  . Gout 01/02/2007  . Essential hypertension 01/02/2007    Minahil Quinlivan PT, DPT 12/16/2019, 3:01 PM  [place] MAIN [facility] SERVICES [address] [place], [place], [address] Phone: [phone]   Fax:  [phone]  Name: Carl [name] MRN: [id number] Date of Birth: [birth year]

## 2019-12-21 ENCOUNTER — Encounter: Payer: Self-pay | Admitting: Physical Therapy

## 2019-12-21 ENCOUNTER — Ambulatory Visit: Payer: Medicare Other

## 2019-12-21 ENCOUNTER — Other Ambulatory Visit: Payer: Self-pay

## 2019-12-21 DIAGNOSIS — M542 Cervicalgia: Secondary | ICD-10-CM

## 2019-12-21 DIAGNOSIS — R293 Abnormal posture: Secondary | ICD-10-CM

## 2019-12-21 NOTE — Therapy (Signed)
[place] MAIN [facility] SERVICES [address] [place], [place], [address] Phone: [phone]   Fax:  [phone]  Physical Therapy Treatment  Patient Details  Name: Carl [name] MRN: [id number] Date of Birth: [birth year] Referring Provider (PT): Dr. Manuella Ghazi   Encounter Date: 12/21/2019  PT End of Session - 12/21/19 1140    Visit Number  9    Number of Visits  17    Date for PT Re-Evaluation  01/13/20    Authorization Type  medicare    PT Start Time  1135    PT Stop Time  1220    PT Time Calculation (min)  45 min    Activity Tolerance  Patient tolerated treatment well    Behavior During Therapy  [facility] for tasks assessed/performed       Past Medical History:  Diagnosis Date  . Adenoma of colon   . Alcoholism ([place])    still drinking wine as of Feb 2013  . Anxiety and depression   . Cataract   . Cervical spondylosis without myelopathy   . Cervicalgia of occipito-atlanto-axial region   . Chronic pain disorder   . Degeneration of cervical intervertebral disc   . Degeneration, intervertebral disc, cervical   . Duodenal ulcer 2011  . Facet arthropathy, cervical   . Fuchs' corneal dystrophy   . Gastric ulcer 2011   egd at [place] Aug 2011.  . GI bleed   . Gout   . Hypertension   . Impaired fasting glucose   . 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 LVH (left ventricular hypertrophy)   . Mild cognitive impairment   . Mitral insufficiency   . Neck pain   . Neuralgia and neuritis   . Nocturia   . Occipital neuralgia   . Osteoarthritis    neck  . Osteoarthritis   . Pure hypercholesterolemia   . Radiculitis   . Stroke ([place])   . Tricuspid insufficiency     Past Surgical History:  Procedure Laterality Date  . COLONOSCOPY WITH PROPOFOL N/A 06/05/2018   Procedure: COLONOSCOPY WITH PROPOFOL;  Surgeon: Manya Silvas, MD;  Location[facility] ENDOSCOPY;  Service: Endoscopy;  Laterality: N/A;   . CYSTOGRAM  03/08  . ESOPHAGOGASTRODUODENOSCOPY (EGD) WITH PROPOFOL N/A 06/20/2017   Procedure: ESOPHAGOGASTRODUODENOSCOPY (EGD) WITH PROPOFOL;  Surgeon: Lucilla Lame, MD;  Location[facility] ENDOSCOPY;  Service: Endoscopy;  Laterality: N/A;    There were no vitals filed for this visit.  Subjective Assessment - 12/21/19 1139    Subjective  Patient reported that his pain today is 7-8/10.    Pertinent History  74 yo male reports chronic neck/headache on left side since 2014-2015. He reports having good strength in BUE but states that the pain is so severe that it does interfere with cognition and concentration. He reports pain is constant. He has tried multiple conservative treatment including medications, chiropractor (only temporary relief, stopped in [place] 2020), steroid injections with no relief. Dr. Manuella Ghazi recently prescribed verampamil which he states has helped some. He is still requiring over the counter medication for pain control.  PMH significant: CVA Feb 2013, some vision loss in right eye from retina bleed in 1990's, HTN (controlled), OA, cervical spondylosis without radiculopathy, former smoker (quit in 1990's)    Limitations  Other (comment);Reading;Writing    How long can you sit comfortably?  NA    How long can you stand comfortably?  NA    How long  can you walk comfortably?  NA    Diagnostic tests  X-ray in March 2020- multiple level cervical spondylosis with loss of disc height especially C3-C4;    Patient Stated Goals  "get rid of pain so that I can do what I would like to do."    Currently in Pain?  Yes    Pain Score  8     Pain Location  Head    Pain Orientation  Left    Pain Descriptors / Indicators  Headache    Pain Onset  More than a month ago    Pain Frequency  Constant         TREATMENT:  Prone with tens unit for pain modulation, concurrent with moist hot pack; pre mod bilaterally; ~T7 and second pad near C5-6 on L and on R side. 63mns, level 25-30, monitored  throughout and adjusted per pt tolerance every few minutes, educated on intervention    PT performed PROM for increased cervical flexibility and to help reduce pain: PT performed passive UT stretch left side 30 sec hold x3 reps; PT performed suboccipital release 30 sec hold, 10 sec rest x5 min;  Progressed to suboccipital release concurrent with chin tucks 5 sec hold x10 reps;  Patient does report increased tenderness at left occiput and suboccipital muscles. Mild tightness noted although hard to palpate trigger points. He also reports pain along left C3 level parapsinals and along C3 nerve root.  PT performed cervical distraction with towel, 8 mins. 20-30sec holds.Pt reported relief of symptoms with both distraction and return to neutral position.    Response to treatment/clinical impression: The patient reported a 25% reduction in symptoms after intervention. Overall the patient believes that therapy is helping, but experiences a return of symptoms quickly after therapy. The patient would benefit from further skilled PT intervention to continue to address limitations and pain management.   PT Education - 12/21/19 1139    Education Details  tens unit, stretches    Person(s) Educated  Patient    Methods  Explanation;Verbal cues    Comprehension  Verbalized understanding;Returned demonstration;Verbal cues required;Need further instruction       PT Short Term Goals - 12/16/19 1143      PT SHORT TERM GOAL #1   Title  Patient will be adherent to HEP at least 3x a week to improve functional strength and balance for better safety at home.    Time  2    Period  Weeks    Status  Achieved    Target Date  11/30/19        PT Long Term Goals - 12/16/19 1143      PT LONG TERM GOAL #1   Title  Patient will exhibit improved postural support with erect head in sitting/standing at least 50+ % of time to improve safety awareness with activities and reduce neck discomfort    Time  4     Period  Weeks    Status  Partially Met    Target Date  01/13/20      PT LONG TERM GOAL #2   Title  Patient will improve FOTO score to >55/100 exhibiting improved mobility with less pain with ADLs.    Time  4    Period  Weeks    Status  Not Met    Target Date  01/13/20      PT LONG TERM GOAL #3   Title  Patient will reports a worst pain of 5/10 in head over  last 2 weeks to exhibit improved symptoms with ADLs.    Baseline  worst pain 8/10    Time  4    Period  Weeks    Status  Not Met    Target Date  01/13/20            Plan - 12/21/19 1157    Personal Factors and Comorbidities  Comorbidity 3+;Past/Current Experience;Time since onset of injury/illness/exacerbation    Comorbidities  CVA Feb 2013, some vision loss in right eye from retina bleed in 1990's, HTN (controlled), OA, cervical spondylosis without radiculopathy, former smoker (quit in 1990's)    Examination-Activity Limitations  Caring for Others;Carry;Lift;Other    Examination-Participation Restrictions  Cleaning;Community Activity;Medication Management;Personal Finances;Volunteer;Yard Work    [employer] officer  Evolving/Moderate complexity    Rehab Potential  Fair    PT Frequency  2x / week    PT Duration  4 weeks    PT Treatment/Interventions  Cryotherapy;Electrical Stimulation;Moist Heat;Traction;Functional mobility training;Therapeutic activities;Therapeutic exercise;Patient/family education;Manual techniques;Passive range of motion;Dry needling;Energy conservation;Taping;Spinal Manipulations    PT Next Visit Plan  initiate HEP for postural re-education    PT Home Exercise Plan  will address next session    Consulted and Agree with Plan of Care  Patient       Patient will benefit from skilled therapeutic intervention in order to improve the following deficits and impairments:  Decreased mobility, Hypomobility, Improper body mechanics, Decreased cognition, Decreased activity tolerance, Postural  dysfunction, Pain  Visit Diagnosis: Abnormal posture  Cervicalgia     Problem List Patient Active Problem List   Diagnosis Date Noted  . Acute renal failure (ARF) ([place]) 04/02/2019  . Neurogenic pain 12/21/2018  . Cervical spondylosis with radiculopathy 10/13/2018  . Melena 09/15/2018  . Acute esophagogastric ulcer 09/15/2018  . Duodenum ulcer 09/15/2018  . 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 Chronic neck pain (Primary Area of Pain) (Left) 07/09/2018  . Chronic pain syndrome 07/09/2018  . Pharmacologic therapy 07/09/2018  . Disorder of skeletal system 07/09/2018  . Problems influencing health status 07/09/2018  . Chronic facial pain (Secondary Area of Pain) (Left) 07/09/2018  . Cervicalgia (Left) 07/09/2018  . Abnormal behavior 04/01/2018  . Socially inappropriate behavior 04/01/2018  . History of adenomatous polyp of colon 03/20/2018  . History of upper gastrointestinal bleeding 03/20/2018  . GIB (gastrointestinal bleeding) 06/19/2017  . Occipital neuralgia (Left) 03/04/2017  . GI bleed 03/03/2017  . History of stroke 03/03/2017  . Right sided weakness 03/03/2017  . Slurred speech 03/03/2017  . Word finding difficulty 03/03/2017  . Opiate use 03/03/2017  . Chronic headaches 03/03/2017  . Chronic knee pain (Bilateral) 03/03/2017  . 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 Cervical spondylosis without myelopathy 05/16/2015  . Cervicalgia of occipito-atlanto-axial region (Left) 02/09/2015  . 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 Chronic pain disorder 09/09/2013  . Neuralgia, neuritis, and radiculitis, unspecified 09/09/2013  . Cognitive disorder 06/23/2013  . Depression, major, single episode, moderate ([place]) 06/23/2013  . Mild cognitive impairment 06/23/2013  . Anxiety and depression 05/05/2013  . Cervical facet arthropathy (Left) 05/05/2013   . Spondylosis without myelopathy or radiculopathy, cervical region 05/05/2013  . Nocturia 04/22/2013  . LVH (left ventricular hypertrophy) 02/19/2013  . Mitral insufficiency 02/19/2013  . Pure hypercholesterolemia 02/19/2013  . Tricuspid insufficiency 02/19/2013  . Cataracts, bilateral 08/21/2012  . Fuchs' corneal dystrophy 08/21/2012  . Alcohol dependence in remission ([place])  03/14/2011  . DDD (degenerative disc disease), cervical 03/14/2011  . Persistent insomnia 03/14/2011  . Osteoarthritis involving multiple joints 03/14/2011  . Primary generalized (osteo)arthritis 03/14/2011  . ANXIETY, SITUATIONAL 06/15/2010  . Situational anxiety 06/15/2010  . CELLULITIS, HAND, LEFT 05/03/2010  . Cellulitis of unspecified part of limb 05/03/2010  . Unspecified contact dermatitis due to plants, except food 01/12/2010  . Sprain of joints and ligaments of unspecified parts of neck, initial encounter 01/03/2010  . LUNG NODULE 07/17/2009  . Chest pain 07/13/2009  . UNSPECIFIED PROSTATITIS 06/15/2009  . Alcohol abuse 05/15/2009  . Alcohol withdrawal ([place]) 04/24/2009  . Impaired fasting glucose 11/04/2007  . Gout 01/02/2007  . Essential hypertension 01/02/2007    Lieutenant Diego PT, DPT 12:57 PM,12/21/19   [place] MAIN [facility] SERVICES [address] [place], [place], [address] Phone: [phone]   Fax:  [phone]  Name: Carl [name] MRN: [id number] Date of Birth: 1945-10-11

## 2019-12-23 ENCOUNTER — Encounter: Payer: Self-pay | Admitting: Physical Therapy

## 2019-12-23 ENCOUNTER — Other Ambulatory Visit: Payer: Self-pay

## 2019-12-23 ENCOUNTER — Ambulatory Visit: Payer: Medicare Other | Admitting: Physical Therapy

## 2019-12-23 DIAGNOSIS — R293 Abnormal posture: Secondary | ICD-10-CM

## 2019-12-23 DIAGNOSIS — M542 Cervicalgia: Secondary | ICD-10-CM | POA: Diagnosis not present

## 2019-12-23 NOTE — Therapy (Signed)
[place] MAIN [facility] SERVICES [address] [place], [place], [address] Phone: [phone]   Fax:  [phone]  Physical Therapy Treatment Physical Therapy Progress Note   Dates of reporting period 11/16/19   to   12/23/19   Patient Details  Name: Carl [name] MRN: [id number] Date of Birth: 04-Aug-1946 Referring Provider (PT): Dr. Manuella Ghazi   Encounter Date: 12/23/2019  PT End of Session - 12/23/19 1131    Visit Number  10    Number of Visits  17    Date for PT Re-Evaluation  01/13/20    Authorization Type  medicare    PT Start Time  1132    PT Stop Time  1215    PT Time Calculation (min)  43 min    Activity Tolerance  Patient tolerated treatment well    Behavior During Therapy  [facility] for tasks assessed/performed       Past Medical History:  Diagnosis Date  . Adenoma of colon   . Alcoholism ([place])    still drinking wine as of Feb 2013  . Anxiety and depression   . Cataract   . Cervical spondylosis without myelopathy   . Cervicalgia of occipito-atlanto-axial region   . Chronic pain disorder   . Degeneration of cervical intervertebral disc   . Degeneration, intervertebral disc, cervical   . Duodenal ulcer 2011  . Facet arthropathy, cervical   . Fuchs' corneal dystrophy   . Gastric ulcer 2011   egd at [place] Aug 2011.  . GI bleed   . Gout   . Hypertension   . Impaired fasting glucose   . 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 LVH (left ventricular hypertrophy)   . Mild cognitive impairment   . Mitral insufficiency   . Neck pain   . Neuralgia and neuritis   . Nocturia   . Occipital neuralgia   . Osteoarthritis    neck  . Osteoarthritis   . Pure hypercholesterolemia   . Radiculitis   . Stroke ([place])   . Tricuspid insufficiency     Past Surgical History:  Procedure Laterality Date  . COLONOSCOPY WITH PROPOFOL N/A 06/05/2018   Procedure: COLONOSCOPY WITH PROPOFOL;  Surgeon:  Manya Silvas, MD;  Location: [facility] ENDOSCOPY;  Service: Endoscopy;  Laterality: N/A;  . CYSTOGRAM  03/08  . ESOPHAGOGASTRODUODENOSCOPY (EGD) WITH PROPOFOL N/A 06/20/2017   Procedure: ESOPHAGOGASTRODUODENOSCOPY (EGD) WITH PROPOFOL;  Surgeon: Lucilla Lame, MD;  Location: [facility] ENDOSCOPY;  Service: Endoscopy;  Laterality: N/A;    There were no vitals filed for this visit.  Subjective Assessment - 12/23/19 1137    Subjective  Patient reports feeling mildly better after last time. He does feel like the TENs unit helped. He reports approximately 5/10 neck/head pain today;    Pertinent History  74 yo male reports chronic neck/headache on left side since 2014-2015. He reports having good strength in BUE but states that the pain is so severe that it does interfere with cognition and concentration. He reports pain is constant. He has tried multiple conservative treatment including medications, chiropractor (only temporary relief, stopped in Spr[place] 2020), steroid injections with no relief. Dr. Manuella Ghazi recently prescribed verampamil which he states has helped some. He is still requiring over the counter medication for pain control.  PMH significant: CVA Feb 2013, some vision loss in right eye from retina bleed in 1990's, HTN (controlled), OA, cervical spondylosis without radiculopathy, former smoker (quit in 1990's)  Limitations  Other (comment);Reading;Writing    How long can you sit comfortably?  NA    How long can you stand comfortably?  NA    How long can you walk comfortably?  NA    Diagnostic tests  X-ray in March 2020- multiple level cervical spondylosis with loss of disc height especially C3-C4;    Patient Stated Goals  "get rid of pain so that I can do what I would like to do."    Currently in Pain?  Yes    Pain Score  5     Pain Location  Head    Pain Orientation  Left    Pain Descriptors / Indicators  Headache    Pain Type  Chronic pain    Pain Onset  More than a month ago    Pain Frequency   Constant    Aggravating Factors   worse without medication    Pain Relieving Factors  medication    Effect of Pain on Daily Activities  decreased activity tolerance;    Multiple Pain Sites  No           TREATMENT: Patient supine with moist heat to cervical paraspinals, concurrent with exercise/PROM:  PT performed PROM for increased cervical flexibility and to help reduce pain: PT performed passive levator scapulae stretch left side 30 sec hold x2 reps; Patient denies any tightness with levator scapulae but did have tightness with upper trap stretch;  PT performed suboccipital release 30 sec hold, 10 sec rest x5 min;   Patient does report increased tenderness at left occiput and suboccipital muscles. Mild tightness noted although hard to palpate trigger points. He also reports pain along left C3 level parapsinals and along C3 nerve root.  PT performed soft tissue massage along left cervical paraspinals focusing along C3-suboccipital paraspinals. Patient tolerated well reporting less stiffness and slight reduction in pain;   Following manual therapy:  Finished with TENs to cervical paraspinals along suboccipital and lower cervical region, modulated setting at intensity of #50 x15 min concurrent with moist heat;  Response to treatment: Patient reports feeling mildly better at end of session. He admits its hard to rate pain as he is on a high regiment of pain medications to help cope with severe discomfort and therefore feels that his pain is dulled and has a hard time to determine change. Pt verbalized understanding of HEP;   Patient's condition has the potential to improve in response to therapy. Maximum improvement is yet to be obtained. The anticipated improvement is attainable and reasonable in a generally predictable time.  Patient reports adherence with HEP.                     PT Education - 12/23/19 1131    Education Details  manual therapy, TENs, postural  re-education;    Person(s) Educated  Patient    Methods  Explanation;Verbal cues    Comprehension  Verbalized understanding;Returned demonstration;Verbal cues required;Need further instruction       PT Short Term Goals - 12/16/19 1143      PT SHORT TERM GOAL #1   Title  Patient will be adherent to HEP at least 3x a week to improve functional strength and balance for better safety at home.    Time  2    Period  Weeks    Status  Achieved    Target Date  11/30/19        PT Long Term Goals - 12/16/19 1143  PT LONG TERM GOAL #1   Title  Patient will exhibit improved postural support with erect head in sitting/standing at least 50+ % of time to improve safety awareness with activities and reduce neck discomfort    Time  4    Period  Weeks    Status  Partially Met    Target Date  01/13/20      PT LONG TERM GOAL #2   Title  Patient will improve FOTO score to >55/100 exhibiting improved mobility with less pain with ADLs.    Time  4    Period  Weeks    Status  Not Met    Target Date  01/13/20      PT LONG TERM GOAL #3   Title  Patient will reports a worst pain of 5/10 in head over last 2 weeks to exhibit improved symptoms with ADLs.    Baseline  worst pain 8/10    Time  4    Period  Weeks    Status  Not Met    Target Date  01/13/20            Plan - 12/23/19 1202    Clinical Impression Statement  Patient tolerated session well. He does exhibit increased tightness in left cervical paraspinals this session which was relieved with manual therapy. He continues to have headache but states that pain severity is less following TENs. patient would benefit from additional skilled PT intervention to improve postural re-education, reduce neck/headache and improve overall mobility. consider home TENs unit for pain management;    Personal Factors and Comorbidities  Comorbidity 3+;Past/Current Experience;Time since onset of injury/illness/exacerbation    Comorbidities  CVA Feb 2013,  some vision loss in right eye from retina bleed in 1990's, HTN (controlled), OA, cervical spondylosis without radiculopathy, former smoker (quit in 1990's)    Examination-Activity Limitations  Caring for Others;Carry;Lift;Other    Examination-Participation Restrictions  Cleaning;Community Activity;Medication Management;Personal Finances;Volunteer;Yard Work    [employer] officer  Evolving/Moderate complexity    Rehab Potential  Fair    PT Frequency  2x / week    PT Duration  4 weeks    PT Treatment/Interventions  Cryotherapy;Electrical Stimulation;Moist Heat;Traction;Functional mobility training;Therapeutic activities;Therapeutic exercise;Patient/family education;Manual techniques;Passive range of motion;Dry needling;Energy conservation;Taping;Spinal Manipulations    PT Next Visit Plan  initiate HEP for postural re-education    PT Home Exercise Plan  will address next session    Consulted and Agree with Plan of Care  Patient       Patient will benefit from skilled therapeutic intervention in order to improve the following deficits and impairments:  Decreased mobility, Hypomobility, Improper body mechanics, Decreased cognition, Decreased activity tolerance, Postural dysfunction, Pain  Visit Diagnosis: Abnormal posture  Cervicalgia     Problem List Patient Active Problem List   Diagnosis Date Noted  . Acute renal failure (ARF) ([place]) 04/02/2019  . Neurogenic pain 12/21/2018  . Cervical spondylosis with radiculopathy 10/13/2018  . Melena 09/15/2018  . Acute esophagogastric ulcer 09/15/2018  . Duodenum ulcer 09/15/2018  . 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 Chronic neck pain (Primary Area of Pain) (Left) 07/09/2018  . Chronic pain syndrome 07/09/2018  . Pharmacologic therapy 07/09/2018  . Disorder of skeletal system 07/09/2018  . Problems influencing health status 07/09/2018  . Chronic facial pain (Secondary Area of Pain) (Left) 07/09/2018  . Cervicalgia (Left)  07/09/2018  . Abnormal behavior 04/01/2018  . Socially inappropriate behavior 04/01/2018  . History of adenomatous polyp of colon 03/20/2018  .  History of upper gastrointestinal bleeding 03/20/2018  . GIB (gastrointestinal bleeding) 06/19/2017  . Occipital neuralgia (Left) 03/04/2017  . GI bleed 03/03/2017  . History of stroke 03/03/2017  . Right sided weakness 03/03/2017  . Slurred speech 03/03/2017  . Word finding difficulty 03/03/2017  . Opiate use 03/03/2017  . Chronic headaches 03/03/2017  . Chronic knee pain (Bilateral) 03/03/2017  . 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 Cervical spondylosis without myelopathy 05/16/2015  . Cervicalgia of occipito-atlanto-axial region (Left) 02/09/2015  . 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 Chronic pain disorder 09/09/2013  . Neuralgia, neuritis, and radiculitis, unspecified 09/09/2013  . Cognitive disorder 06/23/2013  . Depression, major, single episode, moderate ([place]) 06/23/2013  . Mild cognitive impairment 06/23/2013  . Anxiety and depression 05/05/2013  . Cervical facet arthropathy (Left) 05/05/2013  . Spondylosis without myelopathy or radiculopathy, cervical region 05/05/2013  . Nocturia 04/22/2013  . LVH (left ventricular hypertrophy) 02/19/2013  . Mitral insufficiency 02/19/2013  . Pure hypercholesterolemia 02/19/2013  . Tricuspid insufficiency 02/19/2013  . Cataracts, bilateral 08/21/2012  . Fuchs' corneal dystrophy 08/21/2012  . Alcohol dependence in remission ([place]) 03/14/2011  . DDD (degenerative disc disease), cervical 03/14/2011  . Persistent insomnia 03/14/2011  . Osteoarthritis involving multiple joints 03/14/2011  . Primary generalized (osteo)arthritis 03/14/2011  . ANXIETY, SITUATIONAL 06/15/2010  . Situational anxiety 06/15/2010  . CELLULITIS, HAND, LEFT 05/03/2010  . Cellulitis of  unspecified part of limb 05/03/2010  . Unspecified contact dermatitis due to plants, except food 01/12/2010  . Sprain of joints and ligaments of unspecified parts of neck, initial encounter 01/03/2010  . LUNG NODULE 07/17/2009  . Chest pain 07/13/2009  . UNSPECIFIED PROSTATITIS 06/15/2009  . Alcohol abuse 05/15/2009  . Alcohol withdrawal ([place]) 04/24/2009  . Impaired fasting glucose 11/04/2007  . Gout 01/02/2007  . Essential hypertension 01/02/2007    Cathrine Krizan PT, DPT 12/23/2019, 12:22 PM  [place] MAIN [facility] SERVICES [address] [place], [place], [address] Phone: [phone]   Fax:  [phone]  Name: DEVEAN SKOCZYLAS MRN: [id number] Date of Birth: 01-15-1946

## 2019-12-28 ENCOUNTER — Other Ambulatory Visit: Payer: Self-pay

## 2019-12-28 ENCOUNTER — Encounter: Payer: Self-pay | Admitting: Physical Therapy

## 2019-12-28 ENCOUNTER — Ambulatory Visit: Payer: Medicare Other | Admitting: Physical Therapy

## 2019-12-28 DIAGNOSIS — R293 Abnormal posture: Secondary | ICD-10-CM

## 2019-12-28 DIAGNOSIS — M542 Cervicalgia: Secondary | ICD-10-CM

## 2019-12-28 NOTE — Therapy (Signed)
[place] [address] [place], [place], [address] Phone: [phone]   Fax:  [phone]  Physical Therapy Treatment/Discharge Summary  Patient Details  Name: Carl [name] MRN: [id number] Date of Birth: 01-16-1946 Referring Provider (PT): Dr. Manuella Ghazi   Encounter Date: 12/28/2019  PT End of Session - 12/28/19 1206    Visit Number  11    Number of Visits  17    Date for PT Re-Evaluation  01/13/20    Authorization Type  medicare    PT Start Time  1148    PT Stop Time  1230    PT Time Calculation (min)  42 min    Activity Tolerance  Patient tolerated treatment well    Behavior During Therapy  [facility] for tasks assessed/performed       Past Medical History:  Diagnosis Date  . Adenoma of colon   . Alcoholism ([place])    still drinking wine as of Feb 2013  . Anxiety and depression   . Cataract   . Cervical spondylosis without myelopathy   . Cervicalgia of occipito-atlanto-axial region   . Chronic pain disorder   . Degeneration of cervical intervertebral disc   . Degeneration, intervertebral disc, cervical   . Duodenal ulcer 2011  . Facet arthropathy, cervical   . Fuchs' corneal dystrophy   . Gastric ulcer 2011   egd at [place] Aug 2011.  . GI bleed   . Gout   . Hypertension   . Impaired fasting glucose   . 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 LVH (left ventricular hypertrophy)   . Mild cognitive impairment   . Mitral insufficiency   . Neck pain   . Neuralgia and neuritis   . Nocturia   . Occipital neuralgia   . Osteoarthritis    neck  . Osteoarthritis   . Pure hypercholesterolemia   . Radiculitis   . Stroke ([place])   . Tricuspid insufficiency     Past Surgical History:  Procedure Laterality Date  . COLONOSCOPY WITH PROPOFOL N/A 06/05/2018   Procedure: COLONOSCOPY WITH PROPOFOL;  Surgeon: Manya Silvas, MD;  Location: [facility] ENDOSCOPY;  Service: Endoscopy;   Laterality: N/A;  . CYSTOGRAM  03/08  . ESOPHAGOGASTRODUODENOSCOPY (EGD) WITH PROPOFOL N/A 06/20/2017   Procedure: ESOPHAGOGASTRODUODENOSCOPY (EGD) WITH PROPOFOL;  Surgeon: Lucilla Lame, MD;  Location: [facility] ENDOSCOPY;  Service: Endoscopy;  Laterality: N/A;    There were no vitals filed for this visit.  Subjective Assessment - 12/28/19 1205    Subjective  Patient reports having a bad day yesterday. He reports less pain today. He did go see neurologist who has adjusted his pain medication;    Pertinent History  74 yo male reports chronic neck/headache on left side since 2014-2015. He reports having good strength in BUE but states that the pain is so severe that it does interfere with cognition and concentration. He reports pain is constant. He has tried multiple conservative treatment including medications, chiropractor (only temporary relief, stopped in [place] 2020), steroid injections with no relief. Dr. Manuella Ghazi recently prescribed verampamil which he states has helped some. He is still requiring over the counter medication for pain control.  PMH significant: CVA Feb 2013, some vision loss in right eye from retina bleed in 1990's, HTN (controlled), OA, cervical spondylosis without radiculopathy, former smoker (quit in 1990's)    Limitations  Other (comment);Reading;Writing    How long can you sit comfortably?  NA  How long can you stand comfortably?  NA    How long can you walk comfortably?  NA    Diagnostic tests  X-ray in March 2020- multiple level cervical spondylosis with loss of disc height especially C3-C4;    Patient Stated Goals  "get rid of pain so that I can do what I would like to do."    Currently in Pain?  Yes    Pain Score  2     Pain Location  Head    Pain Orientation  Left    Pain Descriptors / Indicators  Headache    Pain Type  Chronic pain    Pain Onset  More than a month ago    Pain Frequency  Constant    Aggravating Factors   worse without medication    Pain Relieving  Factors  medication    Effect of Pain on Daily Activities  decreased activity tolerance;    Multiple Pain Sites  No       TREATMENT: Patient prone with pillow under stomach  PT applied TENs to bilateral cervical paraspinals, modulated setting at tolerated intensity (#33) concurrent with prone exercise;  Exercise: 2# hand weight: -BUE shoulder extension 2x10 reps; -BUE low row 2x10 reps; -BUE mid row 2x10 reps; Patient required min-moderate verbal/tactile cues for correct exercise technique for proper motor control and optimal muscle strengthening;    [facility] PT Assessment - 12/28/19 0001      Observation/Other Assessments   Focus on Therapeutic Outcomes (FOTO)   60/100     Following exercise patient transitioned to sitting  He reports increased headache and reports that TENS unit is not helping.   PT removed TENs unit  Patient expressed desire to stop therapy at this time. He is not interested in pursing home TENs unit and would like to stop and focus on pain medication for management.  Patient completed FOTO survey; Reinforced HEP  Will discharge at this time based on patient request.                       PT Education - 12/28/19 1206    Education Details  TENs/ postural re-education, strengthening;    Person(s) Educated  Patient    Methods  Explanation;Verbal cues    Comprehension  Verbalized understanding;Returned demonstration;Verbal cues required;Need further instruction       PT Short Term Goals - 12/16/19 1143      PT SHORT TERM GOAL #1   Title  Patient will be adherent to HEP at least 3x a week to improve functional strength and balance for better safety at home.    Time  2    Period  Weeks    Status  Achieved    Target Date  11/30/19        PT Long Term Goals - 12/28/19 1239      PT LONG TERM GOAL #1   Title  Patient will exhibit improved postural support with erect head in sitting/standing at least 50+ % of time to improve safety  awareness with activities and reduce neck discomfort    Time  4    Period  Weeks    Status  Partially Met    Target Date  01/13/20      PT LONG TERM GOAL #2   Title  Patient will improve FOTO score to >55/100 exhibiting improved mobility with less pain with ADLs.    Time  4    Period  Weeks  Status  Achieved    Target Date  01/13/20      PT LONG TERM GOAL #3   Title  Patient will reports a worst pain of 5/10 in head over last 2 weeks to exhibit improved symptoms with ADLs.    Baseline  worst pain 8/10    Time  4    Period  Weeks    Status  Not Met    Target Date  01/13/20            Plan - 12/28/19 1322    Clinical Impression Statement  Patient presents to therapy with minimal pain currently but reports having taken pain medicine prior to session. He was instructed in postural strengthening exercise. Patient does require min VCs for proper exercise technique/positioning for optimal strengthening. Patient expressed desire to stop therapy at this time as he feels like he would like to pursue pain mangement through medication. Patient states that he would not like to pursue home TENs unit at this time. Will discharge patient at this time based on his request.    Personal Factors and Comorbidities  Comorbidity 3+;Past/Current Experience;Time since onset of injury/illness/exacerbation    Comorbidities  CVA Feb 2013, some vision loss in right eye from retina bleed in 1990's, HTN (controlled), OA, cervical spondylosis without radiculopathy, former smoker (quit in 1990's)    Examination-Activity Limitations  Caring for Others;Carry;Lift;Other    Examination-Participation Restrictions  Cleaning;Community Activity;Medication Management;Personal Finances;Volunteer;Yard Work    [employer] officer  Evolving/Moderate complexity    Rehab Potential  Fair    PT Frequency  2x / week    PT Duration  4 weeks    PT Treatment/Interventions  Cryotherapy;Electrical Stimulation;Moist  Heat;Traction;Functional mobility training;Therapeutic activities;Therapeutic exercise;Patient/family education;Manual techniques;Passive range of motion;Dry needling;Energy conservation;Taping;Spinal Manipulations    PT Next Visit Plan  initiate HEP for postural re-education    PT Home Exercise Plan  will address next session    Consulted and Agree with Plan of Care  Patient       Patient will benefit from skilled therapeutic intervention in order to improve the following deficits and impairments:  Decreased mobility, Hypomobility, Improper body mechanics, Decreased cognition, Decreased activity tolerance, Postural dysfunction, Pain  Visit Diagnosis: Abnormal posture  Cervicalgia     Problem List Patient Active Problem List   Diagnosis Date Noted  . Acute renal failure (ARF) ([place]) 04/02/2019  . Neurogenic pain 12/21/2018  . Cervical spondylosis with radiculopathy 10/13/2018  . Melena 09/15/2018  . Acute esophagogastric ulcer 09/15/2018  . Duodenum ulcer 09/15/2018  . 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 Chronic neck pain (Primary Area of Pain) (Left) 07/09/2018  . Chronic pain syndrome 07/09/2018  . Pharmacologic therapy 07/09/2018  . Disorder of skeletal system 07/09/2018  . Problems influencing health status 07/09/2018  . Chronic facial pain (Secondary Area of Pain) (Left) 07/09/2018  . Cervicalgia (Left) 07/09/2018  . Abnormal behavior 04/01/2018  . Socially inappropriate behavior 04/01/2018  . History of adenomatous polyp of colon 03/20/2018  . History of upper gastrointestinal bleeding 03/20/2018  . GIB (gastrointestinal bleeding) 06/19/2017  . Occipital neuralgia (Left) 03/04/2017  . GI bleed 03/03/2017  . History of stroke 03/03/2017  . Right sided weakness 03/03/2017  . Slurred speech 03/03/2017  . Word finding difficulty 03/03/2017  . Opiate use 03/03/2017  . Chronic headaches 03/03/2017  . Chronic knee pain (Bilateral) 03/03/2017  . 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 Cervical  spondylosis without myelopathy 05/16/2015  . Cervicalgia of occipito-atlanto-axial region (Left) 02/09/2015  . 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 Chronic pain disorder 09/09/2013  . Neuralgia, neuritis, and radiculitis, unspecified 09/09/2013  . Cognitive disorder 06/23/2013  . Depression, major, single episode, moderate ([place]) 06/23/2013  . Mild cognitive impairment 06/23/2013  . Anxiety and depression 05/05/2013  . Cervical facet arthropathy (Left) 05/05/2013  . Spondylosis without myelopathy or radiculopathy, cervical region 05/05/2013  . Nocturia 04/22/2013  . LVH (left ventricular hypertrophy) 02/19/2013  . Mitral insufficiency 02/19/2013  . Pure hypercholesterolemia 02/19/2013  . Tricuspid insufficiency 02/19/2013  . Cataracts, bilateral 08/21/2012  . Fuchs' corneal dystrophy 08/21/2012  . Alcohol dependence in remission ([place]) 03/14/2011  . DDD (degenerative disc disease), cervical 03/14/2011  . Persistent insomnia 03/14/2011  . Osteoarthritis involving multiple joints 03/14/2011  . Primary generalized (osteo)arthritis 03/14/2011  . ANXIETY, SITUATIONAL 06/15/2010  . Situational anxiety 06/15/2010  . CELLULITIS, HAND, LEFT 05/03/2010  . Cellulitis of unspecified part of limb 05/03/2010  . Unspecified contact dermatitis due to plants, except food 01/12/2010  . Sprain of joints and ligaments of unspecified parts of neck, initial encounter 01/03/2010  . LUNG NODULE 07/17/2009  . Chest pain 07/13/2009  . UNSPECIFIED PROSTATITIS 06/15/2009  . Alcohol abuse 05/15/2009  . Alcohol withdrawal ([place]) 04/24/2009  . Impaired fasting glucose 11/04/2007  . Gout 01/02/2007  . Essential hypertension 01/02/2007    Marky Buresh PT, DPT 12/28/2019, 1:40 PM  [place] MAIN [facility] SERVICES [address] [place], [place], [address] Phone: [phone]   Fax:  [phone]  Name: Carl [name] MRN: [id number] Date of Birth: 1946-06-09

## 2019-12-29 ENCOUNTER — Ambulatory Visit: Payer: Medicare Other | Admitting: Urology

## 2019-12-29 ENCOUNTER — Telehealth: Payer: Self-pay | Admitting: Urology

## 2019-12-29 DIAGNOSIS — N529 Male erectile dysfunction, unspecified: Secondary | ICD-10-CM

## 2019-12-29 MED ORDER — SILDENAFIL CITRATE 20 MG PO TABS: ORAL_TABLET | ORAL | 0 refills | Status: DC

## 2019-12-29 NOTE — Telephone Encounter (Signed)
Called pt, no answer. Left detailed message for pt informing him of the information below. Advised pt to call back for questions or concerns. Patient does not have active mychart.Follow up scheduled.

## 2019-12-29 NOTE — Telephone Encounter (Signed)
OK to try revatio 20mg -100mg  on demand for ED, can give 15 tablets. He should follow up with me or shannon in 4-6 weeks to check in. please send the viagra instructions via mychart, thanks  Nickolas Madrid, MD 12/29/2019

## 2019-12-29 NOTE — Telephone Encounter (Signed)
You have never seen this pt for evaluation of ED, please advise thanks

## 2019-12-29 NOTE — Telephone Encounter (Signed)
Pt wants to know if he can get a 5 pack sample of Viagra to see how it would work for him.

## 2019-12-30 ENCOUNTER — Ambulatory Visit: Payer: Medicare Other | Admitting: Physical Therapy

## 2020-01-04 ENCOUNTER — Ambulatory Visit: Payer: Medicare Other | Admitting: Physical Therapy

## 2020-01-06 ENCOUNTER — Ambulatory Visit: Payer: Medicare Other | Admitting: Physical Therapy

## 2020-01-11 ENCOUNTER — Ambulatory Visit: Payer: Medicare Other | Admitting: Physical Therapy

## 2020-01-13 ENCOUNTER — Ambulatory Visit: Payer: Medicare Other | Admitting: Physical Therapy

## 2020-02-03 ENCOUNTER — Ambulatory Visit: Payer: Medicare Other | Admitting: Urology

## 2020-02-24 ENCOUNTER — Encounter
Admission: RE | Admit: 2020-02-24 | Discharge: 2020-02-24 | Disposition: A | Discharge status: Home / Self Care | Payer: Medicare Other | Source: Ambulatory Visit | Attending: Otolaryngology | Admitting: Otolaryngology

## 2020-02-24 ENCOUNTER — Other Ambulatory Visit: Payer: Self-pay

## 2020-02-24 HISTORY — DX: Personal history of urinary calculi: Z87.442

## 2020-02-24 NOTE — Patient Instructions (Signed)
Your procedure is scheduled on:Wed. 6/30 Report to Day Surgery. To find out your arrival time please call [phone] between 1PM - 3PM on .Tues 6/29  Remember: Instructions that are not followed completely may result in serious medical risk,  up to and including death, or upon the discretion of your surgeon and anesthesiologist your  surgery may need to be rescheduled.     _X__ 1. Do not eat food after midnight the night before your procedure.                 No gum chewing or hard candies. You may drink clear liquids up to 2 hours                 before you are scheduled to arrive for your surgery- DO not drink clear                 liquids within 2 hours of the start of your surgery.                 Clear Liquids include:  water, apple juice without pulp, clear Gatorade, G2 or                  Gatorade Zero (avoid Red/Purple/Blue), Black Coffee or Tea (Do not add                 anything to coffee or tea). _____2.   Complete the carbohydrate drink provided to you, 2 hours before arrival.  __X__2.  On the morning of surgery brush your teeth with toothpaste and water, you                may rinse your mouth with mouthwash if you wish.  Do not swallow any toothpaste of mouthwash.     ___ 3.  No Alcohol for 24 hours before or after surgery.   ___ 4.  Do Not Smoke or use e-cigarettes For 24 Hours Prior to Your Surgery.                 Do not use any chewable tobacco products for at least 6 hours prior to                 Surgery.  ___  5.  Do not use any recreational drugs (marijuana, cocaine, heroin, ecstacy, MDMA or other)                For at least one week prior to your surgery.  Combination of these drugs with anesthesia                May have life threatening results.  ____  6.  Bring all medications with you on the day of surgery if instructed.   __x__  7.  Notify your doctor if there is any change in your medical condition      (cold, fever,  infections).     Do not wear jewelry, . Do not wear lotions,  You may wear deodorant. Do not shave 48 hours prior to surgery. Men may shave face and neck. Do not bring valuables to the hospital.    [facility] is not responsible for any belongings or valuables.  Contacts, dentures or bridgework may not be worn into surgery. Leave your suitcase in the car. After surgery it may be brought to your room. For patients admitted to the hospital, discharge time is determined by your treatment team.   Patients discharged the day of  surgery will not be allowed to drive home.   Make arrangements for someone to be with you for the first 24 hours of your Same Day Discharge.    Please read over the following fact sheets that you were given:       __x__ Take these medicines the morning of surgery with A SIP OF WATER:    1. amLODipine (NORVASC) 10 MG tablet  2. carvedilol (COREG) 6.25 MG tablet  3. hydrALAZINE (APRESOLINE) 100 MG tablet  4.  5.  6.  ____ Fleet Enema (as directed)   __x__Shower the night before and morning of surgery  ____ Use Benzoyl Peroxide Gel as instructed  ____ Use inhalers on the day of surgery  ____ Stop metformin 2 days prior to surgery    ____ Take 1/2 of usual insulin dose the night before surgery. No insulin the morning          of surgery.   ____ Stop Coumadin/Plavix/aspirin on   ____ Stop Anti-inflammatories on    ____ Stop supplements until after surgery.    ____ Bring C-Pap to the hospital.    Sleep in recliner for a few days Milta Deiters Med irrigations after surgery per MD instructions. Stool softener as needed after surgery to prevent constipation

## 2020-02-28 ENCOUNTER — Other Ambulatory Visit
Admission: RE | Admit: 2020-02-28 | Discharge: 2020-02-28 | Disposition: A | Discharge status: Home / Self Care | Payer: Medicare Other | Source: Ambulatory Visit | Attending: Otolaryngology | Admitting: Otolaryngology

## 2020-02-28 ENCOUNTER — Other Ambulatory Visit: Payer: Self-pay

## 2020-02-28 DIAGNOSIS — Z20822 Contact with and (suspected) exposure to covid-19: Secondary | ICD-10-CM | POA: Insufficient documentation

## 2020-02-28 DIAGNOSIS — Z01812 Encounter for preprocedural laboratory examination: Secondary | ICD-10-CM | POA: Diagnosis present

## 2020-02-28 LAB — BASIC METABOLIC PANEL
Anion gap: 10 (ref 5–15)
BUN: 27 mg/dL — ABNORMAL HIGH (ref 8–23)
CO2: 27 mmol/L (ref 22–32)
Calcium: 9.4 mg/dL (ref 8.9–10.3)
Chloride: 105 mmol/L (ref 98–111)
Creatinine, Ser: 1.24 mg/dL (ref 0.61–1.24)
GFR calc Af Amer: >60 mL/min (ref >60)
GFR calc non Af Amer: 57 mL/min — ABNORMAL LOW (ref >60)
Glucose, Bld: 95 mg/dL (ref 70–99)
Potassium: 3.8 mmol/L (ref 3.5–5.1)
Sodium: 142 mmol/L (ref 135–145)

## 2020-02-28 LAB — SARS CORONAVIRUS 2 (TAT 6-24 HRS): SARS Coronavirus 2: NEGATIVE

## 2020-03-01 ENCOUNTER — Other Ambulatory Visit: Payer: Self-pay

## 2020-03-01 ENCOUNTER — Encounter
Admission: RE | Disposition: A | Discharge status: Home / Self Care | Payer: Self-pay | Source: Home / Self Care | Attending: Otolaryngology

## 2020-03-01 ENCOUNTER — Ambulatory Visit
Admission: RE | Admit: 2020-03-01 | Discharge: 2020-03-01 | Disposition: A | Discharge status: Home / Self Care | Payer: Medicare Other | Attending: Otolaryngology | Admitting: Otolaryngology

## 2020-03-01 ENCOUNTER — Ambulatory Visit: Payer: Medicare Other | Admitting: Anesthesiology

## 2020-03-01 ENCOUNTER — Encounter: Payer: Self-pay | Admitting: Otolaryngology

## 2020-03-01 DIAGNOSIS — Z792 Long term (current) use of antibiotics: Secondary | ICD-10-CM | POA: Diagnosis not present

## 2020-03-01 DIAGNOSIS — I10 Essential (primary) hypertension: Secondary | ICD-10-CM | POA: Diagnosis not present

## 2020-03-01 DIAGNOSIS — I6930 Unspecified sequelae of cerebral infarction: Secondary | ICD-10-CM | POA: Diagnosis not present

## 2020-03-01 DIAGNOSIS — E7800 Pure hypercholesterolemia, unspecified: Secondary | ICD-10-CM | POA: Diagnosis not present

## 2020-03-01 DIAGNOSIS — Z886 Allergy status to analgesic agent status: Secondary | ICD-10-CM | POA: Diagnosis not present

## 2020-03-01 DIAGNOSIS — Z888 Allergy status to other drugs, medicaments and biological substances status: Secondary | ICD-10-CM | POA: Diagnosis not present

## 2020-03-01 DIAGNOSIS — Z87891 Personal history of nicotine dependence: Secondary | ICD-10-CM | POA: Diagnosis not present

## 2020-03-01 DIAGNOSIS — J343 Hypertrophy of nasal turbinates: Secondary | ICD-10-CM | POA: Diagnosis not present

## 2020-03-01 DIAGNOSIS — Z79899 Other long term (current) drug therapy: Secondary | ICD-10-CM | POA: Insufficient documentation

## 2020-03-01 DIAGNOSIS — R519 Headache, unspecified: Secondary | ICD-10-CM | POA: Insufficient documentation

## 2020-03-01 DIAGNOSIS — J342 Deviated nasal septum: Secondary | ICD-10-CM | POA: Insufficient documentation

## 2020-03-01 DIAGNOSIS — J329 Chronic sinusitis, unspecified: Secondary | ICD-10-CM | POA: Diagnosis not present

## 2020-03-01 HISTORY — PX: IMAGE GUIDED SINUS SURGERY: SHX6570

## 2020-03-01 HISTORY — PX: SEPTOPLASTY: SHX2393

## 2020-03-01 HISTORY — PX: ETHMOIDECTOMY: SHX5197

## 2020-03-01 HISTORY — PX: TURBINATE REDUCTION: SHX6157

## 2020-03-01 HISTORY — PX: MAXILLARY ANTROSTOMY: SHX2003

## 2020-03-01 SURGERY — SINUS SURGERY, WITH IMAGING GUIDANCE
Anesthesia: General | Laterality: Right

## 2020-03-01 MED ORDER — OXYCODONE HCL 5 MG PO TABS: 5.0000 mg | ORAL_TABLET | Freq: Once | ORAL | Status: DC | PRN

## 2020-03-01 MED ORDER — DEXAMETHASONE SODIUM PHOSPHATE 10 MG/ML IJ SOLN
INTRAMUSCULAR | Status: DC | PRN
  Administered 2020-03-01: 10 mg via INTRAVENOUS

## 2020-03-01 MED ORDER — OXYMETAZOLINE HCL 0.05 % NA SOLN
NASAL | Status: DC | PRN
  Administered 2020-03-01: 1 via TOPICAL

## 2020-03-01 MED ORDER — PROPOFOL 10 MG/ML IV BOLUS
INTRAVENOUS | Status: AC
  Filled 2020-03-01: qty 20

## 2020-03-01 MED ORDER — OXYCODONE HCL 5 MG/5ML PO SOLN: 5.0000 mg | Freq: Once | ORAL | Status: DC | PRN

## 2020-03-01 MED ORDER — ORAL CARE MOUTH RINSE: 15.0000 mL | Freq: Once | OROMUCOSAL | Status: AC

## 2020-03-01 MED ORDER — BACITRACIN ZINC 500 UNIT/GM EX OINT
TOPICAL_OINTMENT | CUTANEOUS | Status: AC
  Filled 2020-03-01: qty 28.35

## 2020-03-01 MED ORDER — FAMOTIDINE 20 MG PO TABS
ORAL_TABLET | ORAL | Status: AC
  Filled 2020-03-01: qty 1

## 2020-03-01 MED ORDER — SODIUM CHLORIDE 0.9 % IV SOLN
INTRAVENOUS | Status: DC | PRN
  Administered 2020-03-01: 40 ug/min via INTRAVENOUS

## 2020-03-01 MED ORDER — REMIFENTANIL HCL 1 MG IV SOLR
INTRAVENOUS | Status: AC
  Filled 2020-03-01: qty 1000

## 2020-03-01 MED ORDER — REMIFENTANIL HCL 1 MG IV SOLR
INTRAVENOUS | Status: DC | PRN
  Administered 2020-03-01: .15 ug/kg/min via INTRAVENOUS

## 2020-03-01 MED ORDER — CEFDINIR 300 MG PO CAPS
300.0000 mg | ORAL_CAPSULE | Freq: Two times a day (BID) | ORAL | 0 refills | Status: DC
Start: 2020-03-01 — End: 2020-11-22

## 2020-03-01 MED ORDER — FENTANYL CITRATE (PF) 100 MCG/2ML IJ SOLN
INTRAMUSCULAR | Status: DC | PRN
  Administered 2020-03-01: 50 ug via INTRAVENOUS

## 2020-03-01 MED ORDER — LIDOCAINE HCL (PF) 4 % IJ SOLN
INTRAMUSCULAR | Status: DC | PRN
Start: 2020-03-01 — End: 2020-03-01
  Administered 2020-03-01: 3 mL

## 2020-03-01 MED ORDER — FENTANYL CITRATE (PF) 100 MCG/2ML IJ SOLN
INTRAMUSCULAR | Status: AC
  Filled 2020-03-01: qty 2

## 2020-03-01 MED ORDER — HYDROCODONE-ACETAMINOPHEN 5-325 MG PO TABS: 1.0000 | ORAL_TABLET | Freq: Four times a day (QID) | ORAL | 0 refills | Status: DC | PRN

## 2020-03-01 MED ORDER — PROPOFOL 10 MG/ML IV BOLUS
INTRAVENOUS | Status: DC | PRN
  Administered 2020-03-01: 130 mg via INTRAVENOUS

## 2020-03-01 MED ORDER — EPHEDRINE 5 MG/ML INJ
INTRAVENOUS | Status: AC
  Filled 2020-03-01: qty 10

## 2020-03-01 MED ORDER — PHENYLEPHRINE HCL (PRESSORS) 10 MG/ML IV SOLN
INTRAVENOUS | Status: AC
  Filled 2020-03-01: qty 1

## 2020-03-01 MED ORDER — ACETAMINOPHEN 10 MG/ML IV SOLN
1000.0000 mg | Freq: Once | INTRAVENOUS | Status: DC | PRN
  Administered 2020-03-01: 1000 mg via INTRAVENOUS

## 2020-03-01 MED ORDER — LACTATED RINGERS IV SOLN: INTRAVENOUS | Status: DC

## 2020-03-01 MED ORDER — GLYCOPYRROLATE 0.2 MG/ML IJ SOLN
INTRAMUSCULAR | Status: AC
  Filled 2020-03-01: qty 1

## 2020-03-01 MED ORDER — SUCCINYLCHOLINE CHLORIDE 20 MG/ML IJ SOLN
INTRAMUSCULAR | Status: DC | PRN
  Administered 2020-03-01: 140 mg via INTRAVENOUS

## 2020-03-01 MED ORDER — EPHEDRINE SULFATE 50 MG/ML IJ SOLN
INTRAMUSCULAR | Status: DC | PRN
  Administered 2020-03-01 (×3): 10 mg via INTRAVENOUS

## 2020-03-01 MED ORDER — CHLORHEXIDINE GLUCONATE 0.12 % MT SOLN
OROMUCOSAL | Status: AC
  Filled 2020-03-01: qty 15

## 2020-03-01 MED ORDER — LIDOCAINE-EPINEPHRINE (PF) 1 %-1:200000 IJ SOLN
INTRAMUSCULAR | Status: AC
  Filled 2020-03-01: qty 30

## 2020-03-01 MED ORDER — GLYCOPYRROLATE 0.2 MG/ML IJ SOLN
INTRAMUSCULAR | Status: DC | PRN
  Administered 2020-03-01 (×2): .1 mg via INTRAVENOUS

## 2020-03-01 MED ORDER — LIDOCAINE-EPINEPHRINE (PF) 1 %-1:200000 IJ SOLN
INTRAMUSCULAR | Status: DC | PRN
  Administered 2020-03-01: 10 mL

## 2020-03-01 MED ORDER — LIDOCAINE HCL (PF) 2 % IJ SOLN
INTRAMUSCULAR | Status: AC
  Filled 2020-03-01: qty 5

## 2020-03-01 MED ORDER — ACETAMINOPHEN 10 MG/ML IV SOLN
INTRAVENOUS | Status: AC
  Filled 2020-03-01: qty 100

## 2020-03-01 MED ORDER — FAMOTIDINE 20 MG PO TABS
20.0000 mg | ORAL_TABLET | Freq: Once | ORAL | Status: AC
  Administered 2020-03-01: 20 mg via ORAL

## 2020-03-01 MED ORDER — OXYMETAZOLINE HCL 0.05 % NA SOLN
NASAL | Status: AC
  Filled 2020-03-01: qty 30

## 2020-03-01 MED ORDER — FENTANYL CITRATE (PF) 100 MCG/2ML IJ SOLN: 25.0000 ug | INTRAMUSCULAR | Status: DC | PRN

## 2020-03-01 MED ORDER — ONDANSETRON HCL 4 MG/2ML IJ SOLN: 4.0000 mg | Freq: Once | INTRAMUSCULAR | Status: DC | PRN

## 2020-03-01 MED ORDER — CHLORHEXIDINE GLUCONATE 0.12 % MT SOLN
15.0000 mL | Freq: Once | OROMUCOSAL | Status: AC
  Administered 2020-03-01: 15 mL via OROMUCOSAL

## 2020-03-01 MED ORDER — LIDOCAINE HCL (CARDIAC) PF 100 MG/5ML IV SOSY
PREFILLED_SYRINGE | INTRAVENOUS | Status: DC | PRN
  Administered 2020-03-01: 50 mg via INTRAVENOUS

## 2020-03-01 MED ORDER — SUCCINYLCHOLINE CHLORIDE 200 MG/10ML IV SOSY
PREFILLED_SYRINGE | INTRAVENOUS | Status: AC
  Filled 2020-03-01: qty 10

## 2020-03-01 MED ORDER — DEXAMETHASONE SODIUM PHOSPHATE 10 MG/ML IJ SOLN
INTRAMUSCULAR | Status: AC
  Filled 2020-03-01: qty 1

## 2020-03-01 MED ORDER — ONDANSETRON HCL 4 MG/2ML IJ SOLN
INTRAMUSCULAR | Status: DC | PRN
  Administered 2020-03-01: 4 mg via INTRAVENOUS

## 2020-03-01 MED ORDER — ONDANSETRON HCL 4 MG/2ML IJ SOLN
INTRAMUSCULAR | Status: AC
  Filled 2020-03-01: qty 2

## 2020-03-01 MED ORDER — PROPOFOL 10 MG/ML IV BOLUS
INTRAVENOUS | Status: DC | PRN
  Administered 2020-03-01: 30 ug/kg/min via INTRAVENOUS

## 2020-03-01 SURGICAL SUPPLY — 41 items
BATTERY INSTRU NAVIGATION (MISCELLANEOUS) ×15 IMPLANT
BNDG EYE OVAL (GAUZE/BANDAGES/DRESSINGS) IMPLANT
CANISTER SUCT 1200ML W/VALVE (MISCELLANEOUS) ×5 IMPLANT
CANISTER SUCT 3000ML PPV (MISCELLANEOUS) ×5 IMPLANT
CNTNR SPEC 2.5X3XGRAD LEK (MISCELLANEOUS) ×6
COAG SUCT 10F 3.5MM HAND CTRL (MISCELLANEOUS) ×5 IMPLANT
CONT SPEC 4OZ STER OR WHT (MISCELLANEOUS) ×4
CONT SPEC 4OZ STRL OR WHT (MISCELLANEOUS) ×6
CONTAINER SPEC 2.5X3XGRAD LEK (MISCELLANEOUS) ×6 IMPLANT
COVER WAND RF STERILE (DRAPES) ×5 IMPLANT
CUP MEDICINE 2OZ PLAST GRAD ST (MISCELLANEOUS) ×5 IMPLANT
DRESSING NASL FOAM PST OP SINU (MISCELLANEOUS) ×3 IMPLANT
DRSG NASAL FOAM POST OP SINU (MISCELLANEOUS) ×5
ELECT REM PT RETURN 9FT ADLT (ELECTROSURGICAL) ×5
ELECTRODE REM PT RTRN 9FT ADLT (ELECTROSURGICAL) ×3 IMPLANT
GLOVE BIO SURGEON STRL SZ7.5 (GLOVE) ×5 IMPLANT
GOWN STRL REUS W/ TWL LRG LVL3 (GOWN DISPOSABLE) ×6 IMPLANT
GOWN STRL REUS W/TWL LRG LVL3 (GOWN DISPOSABLE) ×10
IV LACTATED RINGERS 1000ML (IV SOLUTION) IMPLANT
KIT TURNOVER KIT A (KITS) ×5 IMPLANT
NEEDLE SPNL 25GX3.5 QUINCKE BL (NEEDLE) ×5 IMPLANT
NS IRRIG 500ML POUR BTL (IV SOLUTION) ×5 IMPLANT
PACK HEAD/NECK (MISCELLANEOUS) ×5 IMPLANT
PACKING NASAL EPIS 4X2.4 XEROG (MISCELLANEOUS) ×10 IMPLANT
SHAVER DIEGO BLD STD TYPE A (BLADE) ×5 IMPLANT
SOL ANTI-FOG 6CC FOG-OUT (MISCELLANEOUS) ×3 IMPLANT
SOL FOG-OUT ANTI-FOG 6CC (MISCELLANEOUS) ×2
SPLINT NASAL SEPTAL PRE-CUT (MISCELLANEOUS) ×5 IMPLANT
SPONGE NEURO XRAY DETECT 1X3 (DISPOSABLE) ×5 IMPLANT
SUT CHROMIC 4 0 RB 1X27 (SUTURE) ×5 IMPLANT
SUT ETHILON 3-0 FS-10 30 BLK (SUTURE) ×5
SUT ETHILON 4-0 (SUTURE) ×5
SUT ETHILON 4-0 FS2 18XMFL BLK (SUTURE) ×3
SUT PLAIN GUT 4-0 (SUTURE) IMPLANT
SUTURE EHLN 3-0 FS-10 30 BLK (SUTURE) ×3 IMPLANT
SUTURE ETHLN 4-0 FS2 18XMF BLK (SUTURE) ×3 IMPLANT
SWAB CULTURE AMIES ANAERIB BLU (MISCELLANEOUS) ×5 IMPLANT
SYR 3ML LL SCALE MARK (SYRINGE) ×5 IMPLANT
TRACKER CRANIALMASK (MASK) ×5 IMPLANT
TUBING DECLOG MULTIDEBRIDER (TUBING) ×5 IMPLANT
WATER STERILE IRR 1000ML POUR (IV SOLUTION) ×5 IMPLANT

## 2020-03-01 NOTE — Transfer of Care (Signed)
Immediate Anesthesia Transfer of Care Note  Patient: [name]  Procedure(s) Performed: IMAGE GUIDED SINUS SURGERY (N/A ) SEPTOPLASTY (N/A ) MAXILLARY ANTROSTOMY WITH TISSUE (Bilateral ) ETHMOIDECTOMY (Right ) TURBINATE REDUCTION (Bilateral )  Patient Location: PACU  Anesthesia Type:General  Level of Consciousness: awake and patient cooperative  Airway & Oxygen Therapy: Patient Spontanous Breathing and Patient connected to face mask oxygen  Post-op Assessment: Report given to RN and Post -op Vital signs reviewed and stable  Post vital signs: Reviewed and stable  Last Vitals:  Vitals Value Taken Time  BP 142/76 03/01/20 1012  Temp 36.2 C 03/01/20 1009  Pulse 81 03/01/20 1015  Resp 0 03/01/20 1015  SpO2 95 % 03/01/20 1015  Vitals shown include unvalidated device data.  Last Pain:  Vitals:   03/01/20 1009  TempSrc:   PainSc: (P) 0-No pain      Patients Stated Pain Goal: 0 (19/62/22 9798)  Complications: No complications documented.

## 2020-03-01 NOTE — Anesthesia Postprocedure Evaluation (Signed)
Anesthesia Post Note  Patient: Carl [name]  Procedure(s) Performed: IMAGE GUIDED SINUS SURGERY (N/A ) SEPTOPLASTY (N/A ) MAXILLARY ANTROSTOMY WITH TISSUE (Bilateral ) ETHMOIDECTOMY (Right ) TURBINATE REDUCTION (Bilateral )  Patient location during evaluation: PACU Anesthesia Type: General Level of consciousness: awake and alert Pain management: pain level controlled Vital Signs Assessment: post-procedure vital signs reviewed and stable Respiratory status: spontaneous breathing, nonlabored ventilation, respiratory function stable and patient connected to nasal cannula oxygen Cardiovascular status: blood pressure returned to baseline and stable Postop Assessment: no apparent nausea or vomiting Anesthetic complications: no   No complications documented.   Last Vitals:  Vitals:   03/01/20 1057 03/01/20 1100  BP: 125/78   Pulse: 75 75  Resp: 17 17  Temp:    SpO2: (!) 89% (!) 89%    Last Pain:  Vitals:   03/01/20 1030  TempSrc:   PainSc: 0-No pain                 Arita Miss

## 2020-03-01 NOTE — Anesthesia Preprocedure Evaluation (Signed)
Anesthesia Evaluation  Patient identified by MRN, date of birth, ID band Patient awake    Reviewed: Allergy & Precautions, H&P , NPO status , Patient's Chart, lab work & pertinent test results, reviewed documented beta blocker date and time   History of Anesthesia Complications Negative for: history of anesthetic complications  Airway Mallampati: II  TM Distance: >3 FB Neck ROM: full    Dental  (+) Dental Advidsory Given, Caps, Missing, Poor Dentition   Pulmonary neg pulmonary ROS, former smoker,           Cardiovascular Exercise Tolerance: Good hypertension, (-) angina(-) CAD, (-) Past MI, (-) Cardiac Stents and (-) CABG (-) dysrhythmias + Valvular Problems/Murmurs      Neuro/Psych  Headaches, neg Seizures PSYCHIATRIC DISORDERS Anxiety Depression  Neuromuscular disease CVA, Residual Symptoms    GI/Hepatic PUD, neg GERD  ,(+)     substance abuse  alcohol use,   Endo/Other  negative endocrine ROS  Renal/GU Renal diseasenegative Renal ROS  negative genitourinary   Musculoskeletal   Abdominal   Peds  Hematology negative hematology ROS (+)   Anesthesia Other Findings Past Medical History: No date: Alcoholism ([place])     Comment:  still drinking wine as of Feb 2013 No date: Anxiety and depression No date: Cataract No date: Cervical spondylosis without myelopathy No date: Cervicalgia of occipito-atlanto-axial region No date: Chronic pain disorder No date: Degeneration of cervical intervertebral disc 2011: Duodenal ulcer No date: Facet arthropathy, cervical No date: Fuchs' corneal dystrophy 2011: Gastric ulcer     Comment:  egd at [place] Aug 2011. No date: GI bleed No date: Gout No date: Hypertension No date: Impaired fasting glucose No date: Intractable 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 status  migrainosus No date: LVH (left ventricular hypertrophy) No date: Mitral insufficiency No date:  Neck pain No date: Neuralgia and neuritis No date: Nocturia No date: Occipital neuralgia No date: Osteoarthritis     Comment:  neck No date: Osteoarthritis No date: Pure hypercholesterolemia No date: Radiculitis No date: Stroke ([place]) No date: Tricuspid insufficiency   Reproductive/Obstetrics negative OB ROS                             Anesthesia Physical  Anesthesia Plan  ASA: III  Anesthesia Plan: General   Post-op Pain Management:    Induction: Intravenous  PONV Risk Score and Plan: 3 and Propofol infusion, TIVA, Ondansetron and Dexamethasone  Airway Management Planned: Oral ETT  Additional Equipment: None  Intra-op Plan:   Post-operative Plan: Extubation in OR  Informed Consent: I have reviewed the patients History and Physical, chart, labs and discussed the procedure including the risks, benefits and alternatives for the proposed anesthesia with the patient or authorized representative who has indicated his/her understanding and acceptance.     Dental Advisory Given  Plan Discussed with: Anesthesiologist, CRNA and Surgeon  Anesthesia Plan Comments: (Discussed risks of anesthesia with patient, including PONV, sore throat, lip/dental damage. Rare risks discussed as well, such as cardiorespiratory and neurological sequelae. Patient understands.)        Anesthesia Quick Evaluation

## 2020-03-01 NOTE — Anesthesia Procedure Notes (Signed)
Procedure Name: Intubation Date/Time: 03/01/2020 7:44 AM Performed by: Nolon Lennert, RN Pre-anesthesia Checklist: Patient identified, Emergency Drugs available, Suction available and Patient being monitored Patient Re-evaluated:Patient Re-evaluated prior to induction Oxygen Delivery Method: Circle system utilized Preoxygenation: Pre-oxygenation with 100% oxygen Induction Type: IV induction Ventilation: Mask ventilation without difficulty Laryngoscope Size: McGraph and 4 Grade View: Grade I Tube type: Oral Rae Tube size: 7.0 mm Number of attempts: 1 Airway Equipment and Method: Stylet,  Oral airway and Video-laryngoscopy Placement Confirmation: ETT inserted through vocal cords under direct vision,  positive ETCO2 and breath sounds checked- equal and bilateral Secured at: 21 cm Tube secured with: Tape Dental Injury: Teeth and Oropharynx as per pre-operative assessment

## 2020-03-01 NOTE — Op Note (Signed)
03/01/2020  9:59 AM    Carl [name]  [id number]   Pre-Op Diagnosis:  DEVIATED SEPTUM, CHRONIC SINUSITIS, INFERIOR TURBINATE HYPERTROPHY  Post-op Diagnosis: DEVIATED SEPTUM, CHRONIC SINUSITIS, INFERIOR TURBINATE HYPERTROPHY   Procedure:  1)  Image Guided Sinus Surgery,   2)  Bilateral Endoscopic Maxillary Antrostomy with Tissue Removal   3)  Right Total Ethmoidectomy   4)  Nasal Septoplasty   5)  Bilateral submucous resection of the inferior turbinates    Surgeon:  Riley Nearing  Anesthesia:  General endotracheal  EBL:  759FM  Complications:  None  Findings: Right septal deviation, bilateral inferior turbinate hypertrophy, sinus mucosal inflammation with pus in right maxillary sinus.   Procedure: After the patient was identified in holding and the benefits of the procedure were reviewed as well as the consent and risks, the patient was taken to the operating room and with the patient in a comfortable supine position,  general orotracheal anesthesia was induced without difficulty.  A proper time-out was performed.  The Stryker image guidance system was set up and calibrated in the normal fashion and felt to be acceptable.  Next 1% Xylocaine with 1:100,000 epinephrine was infiltrated into the inferior turbinates, septum, and anterior middle turbinates and uncinate region  bilaterally.  Several minutes were allowed for this to take effect.  Cottoniod pledgets soaked in Afrin were placed into both nasal cavities and left while the patient was prepped and draped in the standard fashion. The image guided suction was calibrated and used to inspect known points in the nasal cavity to assess accuracy of the image guided system. Accuracy was felt to be excellent.   Beginning on the left hand side a hemitransfixion incision was then created on the leading edge of the septum on the left.  A subperichondrial plane was elevated posteriorly on the left and taken back to the perpendicular plate of  the ethmoid where subperiosteal plane was elevated posteriorly on the left. A large septal spur was identified on the right hand side.  An inferior rim of redundant septal cartilage was removed from where it deviated over the maxillary crest. The perpendicular plate of the ethmoid was separated from the quadrangular cartilage, and a subperiosteal plane elevated on the right of the bony septum. The large septal spur was removed, dividing the septal bone superiorly with Knight scissors, and inferiorly from the maxillary crest with a chisel. The deviated septal cartilage was scored to break up its memory and allow it to straighten further.  The septum was then replaced in the midline. Reinspection through each nostril showed excellent reduction of the septal deformity. A left posterior inferior fenestration was then created to allow hematoma drainage.  The septal incision was closed with 4-0 chromic gut suture. A 4-0 plain gut suture was used to reapproximate the septal flaps to the underlying cartilage, utilizing a running, quilting type stitch.   The left middle turbinate was medialized and the uncinate process then resected with through-cutting forceps as well as the microdebrider. In this fashion the uncinate was completely removed along with soft tissue and bone of the medial wall of the maxillary sinus to create a large patent maxillary antrostomy. The left maxillary sinus was suctioned to clear secretions.    Attention was then turned to the right side where the same procedure was performed, opening the maxillary sinus. Pus was noted on the right and a culture obtained. The sinus was irrigated with saline and suctioned.  Next the right anterior ethmoid sinuses  were dissected beginning inferomedially, entering the ethmoid bulla. Thru cut forceps were used to open the anterior ethmoids. The microdebrider was used as needed to trim loose mucosal edges.  Next the basal lamella was entered and, working  back in a sequential fashion through the ethmoid air cells, the ethmoid sinuses were dissected to the posterior ethmoid sinuses, utilizing the image guided suction and the whole time to reassess the anatomy frequently. Thru cutting forceps were used for this dissection. Care was taken to avoid injury to the lamina papyracea laterally and the skull base superiorly.  The mucosa around the [facility] and anterior ethmoids had a papillary appearance. This was biopsied and the abnormal mucosa, which extended somewhat into the frontal recess, was debrided.  Next, beginning on the left-hand side, a 15 blade was used to incise along the inferior edge of the inferior turbinate. A superior laterally based flap of the medial turbinate mucosa was then elevated. A portion of the underlying conchal bone and lateral mucosa was excised using Knight scissors. The flap was then laid back over the turbinate stump and the bleeding edge of the turbinate cauterized using suction cautery. In a similar fashion the submucous resection was performed on the right.  With the submucous resection completed bilaterally and no active bleeding. the nose was suctioned and inspected. The maxillary sinuses were irrigated with saline. Stammberger absorbable sinus packing was then placed in the middle meatus bilaterally, followed by Xerogel absorbable packing.   The patient was then returned to the anesthesiologist for awakening and taken to recovery room in good condition postoperatively.  Disposition:   PACU and d/c home  Plan: Ice, elevation, narcotic analgesia and prophylactic antibiotics. Begin sinus irrigations with saline tomrrow, irrigating 3-4 times daily. Return to the office in 7 days.  Return to work in 7-10 days, no strenuous activities for two weeks.   Riley Nearing 03/01/2020 9:59 AM

## 2020-03-01 NOTE — Discharge Instructions (Signed)
Septoplasty, Care After This sheet gives you information about how to care for yourself after your procedure. Your health care provider may also give you more specific instructions. If you have problems or questions, contact your health care provider. What can I expect after the procedure? After the procedure, it is common to have:  A mild headache.  A stuffy nose.  A feeling of fullness in your ears.  Bloody fluid coming from your nose. Follow these instructions at home: Medicines   Take over-the-counter and prescription medicines only as told by your health care provider.  If you were prescribed an antibiotic medicine, use it as told by your health care provider. Do not stop using the antibiotic even if you start to feel better.  If you are taking prescription pain medicine, take actions to prevent or treat constipation. Your health care provider may recommend that you: ? Drink enough fluid to keep your urine pale yellow. ? Eat foods that are high in fiber, such as fresh fruits and vegetables, whole grains, and beans. ? Limit foods that are high in fat and processed sugars, such as fried or sweet foods. ? Take an over-the-counter or prescription medicine for constipation. What to avoid   Avoid eating hot and spicy foods for several days after surgery or as told by your health care provider.  Do not blow your nose for 2 weeks after surgery or as told by your health care provider.  Avoid strenuous activities for 2 weeks. These include activities such as running or playing sports. These activities can cause nosebleeds.  Avoid straining when having a bowel movement. Straining can cause a nosebleed.  Avoid very hot or steamy showers for several days after surgery or as told by your health care provider.  Do not lift anything that is heavier than 10 lb (4.5 kg), or the limit that you are told, until your health care provider says that it is safe. General instructions   You may  be asked to clean your nostrils with an over-the-counter saline nasal spray. This will help to clear the crusts and blood clots in your nose. Use it as told by your health care provider.  Raise (elevate) your head while you are lying down.  If you have nasal splints, follow your health care provider's instructions about removal.  If your nose was packed with gauze, follow your health care provider's instructions about removal.  Keep all follow-up visits as told by your health care provider. This is important. Contact a health care provider if you:  Develop swelling or increased pain in your nose.  Have yellowish-white fluid (pus) coming from your nose.  Have a fever.  Have severe diarrhea.  Have nausea that does not go away.  Cannot breathe through your nose. Get help right away if you:  Are short of breath.  Feel dizzy or you faint.  Have vision changes.  Are bleeding heavily from your nose.  Are vomiting.  Have a severe headache or a stiff neck. Summary  After the procedure, it is common to have a mild headache, stuffy nose, feeling of fullness in your ears, and bloody fluid coming from your nose.  Follow instructions from your health care provider about food and fluids that you should avoid.  Do not blow your nose for 2 weeks after the surgery.  Do not lift anything that is heavier than 10 lb (4.5 kg), or the limit that you are told, until your health care provider says that it is   safe. This information is not intended to replace advice given to you by your health care provider. Make sure you discuss any questions you have with your health care provider. Document Revised: 06/05/2018 Document Reviewed: 09/26/2017 Elsevier Patient Education  2020 [employer]  AMBULATORY SURGERY  DISCHARGE INSTRUCTIONS   1) The drugs that you were given will stay in your system until tomorrow so for the next 24 hours you should not:  A) Drive an automobile B) Make any legal  decisions C) Drink any alcoholic beverage   2) You may resume regular meals tomorrow.  Today it is better to start with liquids and gradually work up to solid foods.  You may eat anything you prefer, but it is better to start with liquids, then soup and crackers, and gradually work up to solid foods.   3) Please notify your doctor immediately if you have any unusual bleeding, trouble breathing, redness and pain at the surgery site, drainage, fever, or pain not relieved by medication.    4) Additional Instructions:        Please contact your physician with any problems or Same Day Surgery at [phone], Monday through Friday 6 am to 4 pm, or [place] [facility] [place] Main number at [phone]. 

## 2020-03-01 NOTE — H&P (Signed)
History and physical reviewed and will be scanned in later. No change in medical status reported by the patient or family, appears stable for surgery. All questions regarding the procedure answered, and patient (or family if a child) expressed understanding of the procedure. ? ?Carl [name] Mallori Araque ?@TODAY@ ?

## 2020-03-07 LAB — AEROBIC/ANAEROBIC CULTURE W GRAM STAIN (SURGICAL/DEEP WOUND)
Culture: NORMAL
Gram Stain: NONE SEEN
Gram Stain: NONE SEEN
Gram Stain: NONE SEEN

## 2020-09-08 ENCOUNTER — Other Ambulatory Visit: Payer: Self-pay | Admitting: Nurse Practitioner

## 2020-09-08 DIAGNOSIS — M542 Cervicalgia: Secondary | ICD-10-CM

## 2020-09-16 ENCOUNTER — Other Ambulatory Visit: Payer: Self-pay

## 2020-09-16 ENCOUNTER — Ambulatory Visit
Admission: RE | Admit: 2020-09-16 | Discharge: 2020-09-16 | Disposition: A | Discharge status: Home / Self Care | Payer: Medicare Other | Source: Ambulatory Visit | Attending: Nurse Practitioner | Admitting: Nurse Practitioner

## 2020-09-16 DIAGNOSIS — M542 Cervicalgia: Secondary | ICD-10-CM | POA: Diagnosis not present

## 2020-11-22 ENCOUNTER — Other Ambulatory Visit: Payer: Self-pay

## 2020-11-22 ENCOUNTER — Encounter: Payer: Self-pay | Admitting: Urology

## 2020-11-22 ENCOUNTER — Ambulatory Visit: Payer: Medicare Other | Admitting: Urology

## 2020-11-22 VITALS — BP 86/52 | HR 77 | Ht 68.0 in | Wt 191.0 lb

## 2020-11-22 DIAGNOSIS — R339 Retention of urine, unspecified: Secondary | ICD-10-CM | POA: Diagnosis not present

## 2020-11-22 DIAGNOSIS — N35914 Unspecified anterior urethral stricture, male: Secondary | ICD-10-CM

## 2020-11-22 LAB — BLADDER SCAN AMB NON-IMAGING

## 2020-11-22 NOTE — Progress Notes (Signed)
   11/22/2020 8:58 AM   [name] Mar 17, 1946 [id number]  Reason for visit: Follow up prostatitis/abnormal prostate findings on CT, history of urethral stricture  HPI: I saw Carl [name] back in urology clinic for follow-up of prostatitis/abnormal prostate findings on CT, and history of urethral stricture.  I originally met him on 04/02/2019 when he presented to the ER with recurrent UTIs and urinary retention with a bladder scan over 800 cc and lower abdominal pain.  I was able to pass a 66 [place] Coloplast catheter and dilated distal urethral stricture and get the catheter into the bladder with return of clear yellow urine.  He passed a voiding trial a week later with a PVR less than 50 mL.  He reports he is continuing to void well and denies any urinary complaints at this time.  PVRs in clinic have been normal since that time, including normal at 54 mL today.  He also had a history of an abnormal CT of the prostate with a possible abscess first prostatitis, and DRE was benign, and PSA was essentially normal for his age at 4.1(33% free) and he elected to defer biopsy.  He continues to do well from a urinary perspective.  He is voiding with a strong stream and denies any leakage, gross hematuria, weak stream, or UTIs.  We discussed the risks and benefits of PSA screening, and the AUA guidelines that do not recommend ongoing PSA screening after age 57.  We reviewed return precautions at length including gross hematuria, UTIs, urinary retention, or new urinary symptoms.  Follow-up with urology as needed   Carl [name], [place] [address] [place], [place] [phone]

## 2020-12-13 ENCOUNTER — Encounter: Payer: Self-pay | Admitting: Urology

## 2020-12-13 ENCOUNTER — Ambulatory Visit (INDEPENDENT_AMBULATORY_CARE_PROVIDER_SITE_OTHER): Payer: Medicare Other | Admitting: Urology

## 2020-12-13 ENCOUNTER — Other Ambulatory Visit: Payer: Self-pay

## 2020-12-13 DIAGNOSIS — R339 Retention of urine, unspecified: Secondary | ICD-10-CM

## 2020-12-13 DIAGNOSIS — B356 Tinea cruris: Secondary | ICD-10-CM

## 2020-12-13 LAB — BLADDER SCAN AMB NON-IMAGING: Scan Result: 597

## 2020-12-13 MED ORDER — NYSTATIN 100000 UNIT/GM EX POWD: 1.0000 "application " | Freq: Three times a day (TID) | CUTANEOUS | 0 refills | Status: AC

## 2020-12-13 NOTE — Progress Notes (Signed)
12/13/2020 8:32 AM   Carl [name] March 16, 1946 [id number]  Referring provider: [facility] [address] [place],  [place] [address]  Chief Complaint  Patient presents with  . Urinary Retention   Urological history: 1.  Urethral stricture -first occurrence in July 2020 when Dr. Diamantina Providence had to place a catheter after ED nurse met resistance  2. Prostate cyst/prostatitis -non-contrast CT 08/2019 noted 2.1 x 1.5 x 1.5 cm ovoid area of low attenuation in the right mid prostate by stores the right hemi prostate and perhaps very subtle stranding about the prostate gland. Findings may represent focal prostatitis or prostate abscess -treated with 3-week course of Bactrim   3. BPH with LU TS -PSA 4.1 (33% free) 11/2019   4. ED -managed with sildenafil 20 mg, on-demand-dosing   HPI: Carl [name] is a 75 y.o. male who presented to the office earlier this morning in acute urinary retention.  He stated he has been having difficulty with urination over the last week and then last evening around 11:00 PM he could not pass urine.  His PVR upon presentation to the office was 600 cc.  Patient denies any modifying or aggravating factors.  Patient denies any gross hematuria, dysuria or suprapubic/flank pain.  Patient denies any fevers, chills, nausea or vomiting.   PMH: Past Medical History:  Diagnosis Date  . Adenoma of colon   . Alcoholism ([place])    still drinking wine as of Feb 2013  . Anxiety and depression   . Cataract   . Cervical spondylosis without myelopathy   . Cervicalgia of occipito-atlanto-axial region   . Chronic pain disorder   . Degeneration of cervical intervertebral disc   . Degeneration, intervertebral disc, cervical   . Duodenal ulcer 2011  . Facet arthropathy, cervical   . Fuchs' corneal dystrophy   . Gastric ulcer 2011   egd at [facility] Aug 2011.  . GI bleed   . Gout   . History of kidney stones   . Hypertension   . Impaired fasting  glucose   . 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 LVH (left ventricular hypertrophy)   . Mild cognitive impairment   . Mitral insufficiency   . Neck pain   . Neuralgia and neuritis   . Nocturia   . Occipital neuralgia   . Osteoarthritis    neck  . Osteoarthritis   . Pure hypercholesterolemia   . Radiculitis   . 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 Tricuspid insufficiency     Surgical History: Past Surgical History:  Procedure Laterality Date  . COLONOSCOPY WITH PROPOFOL N/A 06/05/2018   Procedure: COLONOSCOPY WITH PROPOFOL;  Surgeon: Manya Silvas, MD;  Location: [facility] ENDOSCOPY;  Service: Endoscopy;  Laterality: N/A;  . CYSTOGRAM  03/08  . ESOPHAGOGASTRODUODENOSCOPY (EGD) WITH PROPOFOL N/A 06/20/2017   Procedure: ESOPHAGOGASTRODUODENOSCOPY (EGD) WITH PROPOFOL;  Surgeon: Lucilla Lame, MD;  Location[facility] ENDOSCOPY;  Service: Endoscopy;  Laterality: N/A;  . ETHMOIDECTOMY Right 03/01/2020   Procedure: ETHMOIDECTOMY;  Surgeon: Clyde Canterbury, MD;  Location: [facility] ORS;  Service: ENT;  Laterality: Right;  . EYE SURGERY Bilateral    cataract  . IMAGE GUIDED SINUS SURGERY N/A 03/01/2020   Procedure: IMAGE GUIDED SINUS SURGERY;  Surgeon: Clyde Canterbury, MD;  Location: [facility] ORS;  Service: ENT;  Laterality: N/A;  . MAXILLARY ANTROSTOMY Bilateral 03/01/2020   Procedure: MAXILLARY ANTROSTOMY WITH TISSUE;  Surgeon: Richardson Landry,  Eddie Dibbles, MD;  Location: [facility] ORS;  Service: ENT;  Laterality: Bilateral;  . SEPTOPLASTY N/A 03/01/2020   Procedure: SEPTOPLASTY;  Surgeon: Clyde Canterbury, MD;  Location: [facility] ORS;  Service: ENT;  Laterality: N/A;  . TURBINATE REDUCTION Bilateral 03/01/2020   Procedure: TURBINATE REDUCTION;  Surgeon: Clyde Canterbury, MD;  Location: [facility] ORS;  Service: ENT;  Laterality: Bilateral;    Home Medications:  Allergies as of 12/13/2020      Reactions   Aspirin Other (See Comments)   Other reaction(s): Other (See Comments) He  gets GI bleed.     Nsaids Other (See Comments)   Gi bleed   Ace Inhibitors Other (See Comments)   unknown   Lisinopril Other (See Comments)   unknown   Hydrochlorothiazide Other (See Comments)   Gout   Ramipril Other (See Comments)   unknown      Medication List       Accurate as of December 13, 2020  8:32 AM. If you have any questions, ask your nurse or doctor.        acetaminophen 500 MG tablet Commonly known as: TYLENOL Take 500-1,000 mg by mouth every 6 (six) hours as needed for mild pain, moderate pain or fever.   amLODipine 10 MG tablet Commonly known as: NORVASC Take 0.5 tablets (5 mg total) by mouth daily. What changed: when to take this   atorvastatin 10 MG tablet Commonly known as: LIPITOR Take 10 mg by mouth every evening.   carvedilol 6.25 MG tablet Commonly known as: COREG Take 6.25 mg by mouth 2 (two) times daily.   Cholecalciferol 25 MCG (1000 UT) tablet Take 1,000 Units by mouth daily.   doxazosin 8 MG tablet Commonly known as: CARDURA Take 8 mg by mouth at bedtime.   hydrALAZINE 100 MG tablet Commonly known as: APRESOLINE Take 100 mg by mouth daily.   losartan 100 MG tablet Commonly known as: COZAAR Take 100 mg by mouth daily.   nystatin powder Commonly known as: MYCOSTATIN/NYSTOP Apply 1 application topically 3 (three) times daily. Started by: Zara Council, PA-C   potassium chloride SA 20 MEQ tablet Commonly known as: KLOR-CON Take 20 mEq by mouth daily.       Allergies:  Allergies  Allergen Reactions  . Aspirin Other (See Comments)    Other reaction(s): Other (See Comments) He gets GI bleed.    . Nsaids Other (See Comments)    Gi bleed  . Ace Inhibitors Other (See Comments)    unknown  . Lisinopril Other (See Comments)    unknown  . Hydrochlorothiazide Other (See Comments)    Gout  . Ramipril Other (See Comments)    unknown    Family History: Family History  Problem Relation Age of Onset  . Fibromyalgia Mother    . Hypertension Father   . Diabetes Neg Hx     Social History:  reports that he quit smoking about 42 years ago. His smoking use included cigarettes. He has a 30.00 pack-year smoking history. He has never used smokeless tobacco. He reports that he does not drink alcohol and does not use drugs.  ROS: Pertinent ROS in HPI  Physical Exam: Constitutional:  Well nourished. Alert and oriented, No acute distress. HEENT: [place] AT, mask in place.  Trachea midline Cardiovascular: No clubbing, cyanosis, or edema. Respiratory: Normal respiratory effort, no increased work of breathing. GU: No CVA tenderness.  No bladder fullness or masses.  Patient with circumcised phallus.  Urethral meatus is patent.  No penile discharge.  No penile lesions or rashes. Scrotum without lesions, cysts, rashes and/or edema.  Satellite lesions on inguinal folds with an impacted sebaceous cyst in the left groin which is not infected.     Neurologic: Grossly intact, no focal deficits, moving all 4 extremities. Psychiatric: Normal mood and affect.  Laboratory Data: Hemoglobin A1C <6.5 % 5.5   Average Blood Glucose (Calculated From HgBA1c Level) mg/dL 108   Resulting Agency  [facility]   Narrative Performed by [facility] [employer] has made the following Recommendations:  HBA1C results between 5.7% and 6.4% are suggestive of prediabetes  HBA1C result greater than or equal to 6.5% are diagnostic of diabetes Specimen Collected: 10/03/20 16:27 Last Resulted: 10/03/20 22:42  Received From: [facility]  Result Received: 12/13/20 07:32   Sodium 135 - 145 mmol/L 142   Potassium 3.5 - 5.0 mmol/L 4.4   Chloride 98 - 108 mmol/L 109High   Carbon Dioxide (CO2) 21 - 30 mmol/L 24   Urea Nitrogen (BUN) 7 - 20 mg/dL 31High   Creatinine 0.6 - 1.3 mg/dL 1.5High   Glucose 70 - 140 mg/dL 105   Comment: Interpretive Data:  Above is the NONFASTING reference range.   Below  are the FASTING reference ranges:  NORMAL:   70-99 mg/dL  PREDIABETES: 100-125 mg/dL  DIABETES:  > 125 mg/dL   Calcium 8.7 - 10.2 mg/dL 9.5   AST (Aspartate Aminotransferase) 15 - 41 U/L 18   ALT (Alanine Aminotransferase) 17 - 63 U/L 20   Bilirubin, Total 0.4 - 1.5 mg/dL 0.6   Alk Phos (Alkaline Phosphatase) 24 - 110 U/L 48   Albumin 3.5 - 4.8 g/dL 3.8   Protein, Total 6.2 - 8.1 g/dL 6.8   Anion Gap 3 - 12 mmol/L 9   BUN/CREA Ratio 6 - 27 21   Glomerular Filtration Rate (eGFR)  mL/min/1.73sq m 45   Comment:   NON-Modified eGFR: 45 mL/min/1.73sq m .  We recommend using this value for referral decisions.   Modified eGFR : 52 mL/min/1.73sq m .  This eGFR estimates kidney function using the CKD-Epi equation that increases eGFR by 16% for patients identified as Black/African-American in the medical record. This value was previously reported as the single eGFR before 02/15/2020.   Interpretive Ranges for eGFR(CKD-EPI):   eGFR:       > 60 mL/min/1.73 sq m - Normal  eGFR:       30 - 59 mL/min/1.73 sq m - Moderately Decreased  eGFR:       15 - 29 mL/min/1.73 sq m - Severely Decreased  eGFR:       < 15 mL/min/1.73 sq m - Kidney Failure   Note: These eGFR calculations do not apply in acute situations  when eGFR is changing rapidly or in patients on dialysis.   Resulting Agency  [facility]  Specimen Collected: 10/03/20 16:27 Last Resulted: 10/04/20 09:20  Received From: [facility]  Result Received: 12/13/20 07:32   Ref Range & Units 2 mo ago   Thyroid Stimulating Hormone (TSH) 0.34 - 5.66 IU/mL 0.72   Resulting Agency  [facility]  Specimen Collected: 10/03/20 16:27 Last Resulted: 10/04/20 09:24  Received From: [facility]  Result Received: 12/13/20 07:32   Sodium 135 - 145 mmol/L 142   Potassium 3.5 - 5.0 mmol/L 3.9   Chloride 98 - 108 mmol/L 105   Carbon Dioxide  (CO2) 21 - 30 mmol/L  24   Urea Nitrogen (BUN) 7 - 20 mg/dL 26High   Creatinine 0.6 - 1.3 mg/dL 1.4High   Glucose 70 - 140 mg/dL 94   Comment: Interpretive Data:  Above is the NONFASTING reference range.   Below are the FASTING reference ranges:  NORMAL:   70-99 mg/dL  PREDIABETES: 100-125 mg/dL  DIABETES:  > 125 mg/dL   Calcium 8.7 - 10.2 mg/dL 9.8   Anion Gap 3 - 12 mmol/L 13High   BUN/CREA Ratio 6 - 27 19   Glomerular Filtration Rate (eGFR)  mL/min/1.73sq m 49   Comment:   NON-Modified eGFR: 49 mL/min/1.73sq m .  We recommend using this value for referral decisions.   Modified eGFR : 57 mL/min/1.73sq m .  This eGFR estimates kidney function using the CKD-Epi equation that increases eGFR by 16% for patients identified as Black/African-American in the medical record. This value was previously reported as the single eGFR before 02/15/2020.   Interpretive Ranges for eGFR(CKD-EPI):   eGFR:       > 60 mL/min/1.73 sq m - Normal  eGFR:       30 - 59 mL/min/1.73 sq m - Moderately Decreased  eGFR:       15 - 29 mL/min/1.73 sq m - Severely Decreased  eGFR:       < 15 mL/min/1.73 sq m - Kidney Failure   Note: These eGFR calculations do not apply in acute situations  when eGFR is changing rapidly or in patients on dialysis.   Resulting Agency  [facility]  Specimen Collected: 08/15/20 11:23 Last Resulted: 08/15/20 16:38  Received From: [facility]  Result Received: 12/13/20 07:32   Cholesterol, Total mg/dL 124   Comment: The significance of total cholesterol depends on the values of individual components including HDL, LDL, non-HDL, and triglycerides.  LDL Calculated <190 mg/dL 62   Comment:   <70 mg/dL   Desired target for prior heart disease, stroke, and those at high-risk. Even lower levels may be recommended to decrease risk of heart attack and stroke.  70-159 mg/dL  Comprehensive cardiovascular  risk assessment is recommended. Statintherapy may be advised based on risk factors.  160-189 mg/dL Moderately elevated LDL level. Statin therapy recommended if other risk factors present.  >=190 mg/dL  Severely elevated LDL level. High long-term risk of heart disease and stroke. High-intensity statin therapy recommended for most people. Consider specialist referral.  *A healthy diet and exercise are recommended for all to reduce heart disease risk. Statin choice should be based on patient preference after patient-provider discussions.    Ref: 2018 ACC/AHA Guideline  HDL mg/dL 38   Comment:   People with low HDL levels (see below) are at increased risk of heart disease:  <50 mg/dL for Women  <40 mg/dL for Men  Triglyceride <500 mg/dL 119   Comment:   <150 mg/dL   Normal  150-499 mg/dL  High Triglycerides. Risk of heart disease may be increased. Address reversible causes (eg sugar in foods and beverages, alcohol, and diabetes control). Medication may be appropriate based on other clinical factors.  >=500 mg/dL   Very High Triglycerides. Risk of heart disease and pancreatitis increased. Address reversible causes as above. Medication to lower triglycerides usually advised.   *Ranges provided for adults, pediatric guidelines vary.  Resulting Agency  [facility]  Specimen Collected: 08/15/20 11:23 Last Resulted: 08/15/20 16:38  Received From: [facility]  Result Received: 12/13/20 07:32    Lab Results  Component Value Date   PSA 1.67 06/15/2010   PSA 2.52 01/15/2007  I have reviewed the labs.   Pertinent Imaging: Results for KAINOAH, BARTOSIEWICZ (MRN [id number]) as of 12/13/2020 08:19  Ref. Range 12/13/2020 07:34  Scan Result Unknown [id number]    Simple Catheter Placement Due to urinary retention patient is present today for a foley cath placement.  Patient was cleaned and prepped in a sterile fashion with betadine. A 16 FR silicone coude Coloplast  foley catheter was inserted with some resistance noted distal urethra, urine return was noted  800 ml, urine was orange clear in color.  The balloon was filled with 10cc of sterile water.  A leg bag was attached for drainage.  Patient was given instruction on proper catheter care.  Patient tolerated well, no complications were noted   Performed by: Myself and Maricela Bo. Narda Bonds, CMA  Assessment & Plan:    1. Urinary retention - Bladder Scan (Post Void Residual) in office -Catheter in place -Patient return in 1 week for trial of void, with catheter removal in the morning and an afternoon appointment for PVR -At that appointment we will review the techniques of self-catheterization and will have the patient self cath on a routine basis to prevent further stricture formation  2. Tinea cruris -prescribed Nystatin powder -Instructed the patient to clean the area of the inguinal folds with warm soapy water and dry thoroughly and then apply the powder up to 3 times daily   Return in about 1 week (around 12/20/2020) for TOV and PVR .  These notes generated with voice recognition software. I apologize for typographical errors.  Zara Council, PA-C  [facility] [address]  [place], [place] [address] [phone]

## 2020-12-19 NOTE — Progress Notes (Signed)
12/20/2020 2:53 PM   Carl [name] May 26, 1946 [id number]  Referring provider: [facility] [address] [place],  [place] [address]  Chief Complaint  Patient presents with  . Urinary Retention   Urological history: 1.  Urethral stricture -first occurrence in July 2020 when Dr. Richardo Hanks had to place a catheter after ED nurse met resistance  2. Prostate cyst/prostatitis -non-contrast CT 08/2019 noted 2.1 x 1.5 x 1.5 cm ovoid area of low attenuation in the right mid prostate by stores the right hemi prostate and perhaps very subtle stranding about the prostate gland. Findings may represent focal prostatitis or prostate abscess -treated with 3-week course of Bactrim   3. BPH with LU TS -PSA 4.1 (33% free) 11/2019   4. ED -managed with sildenafil 20 mg, on-demand-dosing   HPI: Carl [name] is a 75 y.o. male who presents today for a trial of void.    Catheter Removal Patient is present today for a catheter removal.  9 ml of water was drained from the balloon. A 16 FR silicone coude foley cath was removed from the bladder no complications were noted . Patient tolerated well.  Performed by: Myself    PMH: Past Medical History:  Diagnosis Date  . Adenoma of colon   . Alcoholism ([facility])    still drinking wine as of Feb 2013  . Anxiety and depression   . Cataract   . Cervical spondylosis without myelopathy   . Cervicalgia of occipito-atlanto-axial region   . Chronic pain disorder   . Degeneration of cervical intervertebral disc   . Degeneration, intervertebral disc, cervical   . Duodenal ulcer 2011  . Facet arthropathy, cervical   . Fuchs' corneal dystrophy   . Gastric ulcer 2011   egd at [facility] Aug 2011.  . GI bleed   . Gout   . History of kidney stones   . Hypertension   . Impaired fasting glucose   . 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 LVH (left ventricular hypertrophy)   . Mild cognitive  impairment   . Mitral insufficiency   . Neck pain   . Neuralgia and neuritis   . Nocturia   . Occipital neuralgia   . Osteoarthritis    neck  . Osteoarthritis   . Pure hypercholesterolemia   . Radiculitis   . 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 Tricuspid insufficiency     Surgical History: Past Surgical History:  Procedure Laterality Date  . COLONOSCOPY WITH PROPOFOL N/A 06/05/2018   Procedure: COLONOSCOPY WITH PROPOFOL;  Surgeon: Scot Jun, MD;  Location: [facility] ENDOSCOPY;  Service: Endoscopy;  Laterality: N/A;  . CYSTOGRAM  03/08  . ESOPHAGOGASTRODUODENOSCOPY (EGD) WITH PROPOFOL N/A 06/20/2017   Procedure: ESOPHAGOGASTRODUODENOSCOPY (EGD) WITH PROPOFOL;  Surgeon: Midge Minium, MD;  Location: [facility] ENDOSCOPY;  Service: Endoscopy;  Laterality: N/A;  . ETHMOIDECTOMY Right 03/01/2020   Procedure: ETHMOIDECTOMY;  Surgeon: Geanie Logan, MD;  Location: [facility] ORS;  Service: ENT;  Laterality: Right;  . EYE SURGERY Bilateral    cataract  . IMAGE GUIDED SINUS SURGERY N/A 03/01/2020   Procedure: IMAGE GUIDED SINUS SURGERY;  Surgeon: Geanie Logan, MD;  Location: [facility] ORS;  Service: ENT;  Laterality: N/A;  . MAXILLARY ANTROSTOMY Bilateral 03/01/2020   Procedure: MAXILLARY ANTROSTOMY WITH TISSUE;  Surgeon: Geanie Logan, MD;  Location: [facility] ORS;  Service: ENT;  Laterality: Bilateral;  . SEPTOPLASTY N/A 03/01/2020   Procedure:  SEPTOPLASTY;  Surgeon: Clyde Canterbury, MD;  Location: [facility] ORS;  Service: ENT;  Laterality: N/A;  . TURBINATE REDUCTION Bilateral 03/01/2020   Procedure: TURBINATE REDUCTION;  Surgeon: Clyde Canterbury, MD;  Location: [facility] ORS;  Service: ENT;  Laterality: Bilateral;    Home Medications:  Allergies as of 12/20/2020      Reactions   Aspirin Other (See Comments)   Other reaction(s): Other (See Comments) He gets GI bleed.     Nsaids Other (See Comments)   Gi bleed   Ace Inhibitors Other (See Comments)   unknown   Lisinopril Other (See Comments)    unknown   Hydrochlorothiazide Other (See Comments)   Gout   Ramipril Other (See Comments)   unknown      Medication List       Accurate as of December 20, 2020  2:53 PM. If you have any questions, ask your nurse or doctor.        acetaminophen 500 MG tablet Commonly known as: TYLENOL Take 500-1,000 mg by mouth every 6 (six) hours as needed for mild pain, moderate pain or fever.   amLODipine 10 MG tablet Commonly known as: NORVASC Take 0.5 tablets (5 mg total) by mouth daily. What changed: when to take this   atorvastatin 10 MG tablet Commonly known as: LIPITOR Take 10 mg by mouth every evening.   carvedilol 6.25 MG tablet Commonly known as: COREG Take 6.25 mg by mouth 2 (two) times daily.   Cholecalciferol 25 MCG (1000 UT) tablet Take 1,000 Units by mouth daily.   doxazosin 8 MG tablet Commonly known as: CARDURA Take 8 mg by mouth at bedtime.   hydrALAZINE 100 MG tablet Commonly known as: APRESOLINE Take 100 mg by mouth daily.   losartan 100 MG tablet Commonly known as: COZAAR Take 100 mg by mouth daily.   nystatin powder Commonly known as: MYCOSTATIN/NYSTOP Apply 1 application topically 3 (three) times daily.   potassium chloride SA 20 MEQ tablet Commonly known as: KLOR-CON Take 20 mEq by mouth daily.       Allergies:  Allergies  Allergen Reactions  . Aspirin Other (See Comments)    Other reaction(s): Other (See Comments) He gets GI bleed.    . Nsaids Other (See Comments)    Gi bleed  . Ace Inhibitors Other (See Comments)    unknown  . Lisinopril Other (See Comments)    unknown  . Hydrochlorothiazide Other (See Comments)    Gout  . Ramipril Other (See Comments)    unknown    Family History: Family History  Problem Relation Age of Onset  . Fibromyalgia Mother   . Hypertension Father   . Diabetes Neg Hx     Social History:  reports that he quit smoking about 42 years ago. His smoking use included cigarettes. He has a 30.00 pack-year  smoking history. He has never used smokeless tobacco. He reports that he does not drink alcohol and does not use drugs.  ROS: Pertinent ROS in HPI  Physical Exam: Constitutional:  Well nourished. Alert and oriented, No acute distress. HEENT: [place] AT, moist mucus membranes.  Trachea midline Cardiovascular: No clubbing, cyanosis, or edema. Respiratory: Normal respiratory effort, no increased work of breathing. Neurologic: Grossly intact, no focal deficits, moving all 4 extremities. Psychiatric: Normal mood and affect.  Laboratory Data: Lab Results  Component Value Date   PSA 1.67 06/15/2010   PSA 2.52 01/15/2007    Pertinent Imaging: Results for JAKEL, ALPHIN (MRN [id number]) as of 12/20/2020  14:51  Ref. Range 12/20/2020 14:31  Scan Result Unknown 57mL     Assessment & Plan:    1. Urinary retention secondary to stricture disease -Bladder Scan (Post Void Residual) in office -Foley removed this a.m. and patient has been voiding well -He would like to return with an appointment with Dr. Diamantina Providence to discuss further options as he does not want to continue to self catheterize to prevent further stricture disease -He states we did show him how to catheterize himself previously and I gave him 9 French straight caths to use in the interim   Return for Return for appoint with Dr. Diamantina Providence to discuss his urethral stricture disease.  These notes generated with voice recognition software. I apologize for typographical errors.  Zara Council, PA-C  [facility] [address]  [place], [place] [address] [phone]  I spent 15 minutes on the day of the encounter to include pre-visit record review, face-to-face time with the patient, and post-visit ordering of tests.

## 2020-12-20 ENCOUNTER — Other Ambulatory Visit: Payer: Self-pay

## 2020-12-20 ENCOUNTER — Encounter: Payer: Self-pay | Admitting: Urology

## 2020-12-20 ENCOUNTER — Ambulatory Visit: Payer: Medicare Other | Admitting: Urology

## 2020-12-20 DIAGNOSIS — N35914 Unspecified anterior urethral stricture, male: Secondary | ICD-10-CM

## 2020-12-20 DIAGNOSIS — R339 Retention of urine, unspecified: Secondary | ICD-10-CM

## 2020-12-20 LAB — BLADDER SCAN AMB NON-IMAGING

## 2020-12-27 ENCOUNTER — Ambulatory Visit: Payer: Medicare Other | Admitting: Urology

## 2020-12-27 ENCOUNTER — Encounter: Payer: Self-pay | Admitting: Urology

## 2020-12-27 ENCOUNTER — Other Ambulatory Visit: Payer: Self-pay

## 2020-12-27 VITALS — BP 152/92 | HR 75 | Ht 67.0 in | Wt 191.0 lb

## 2020-12-27 DIAGNOSIS — N35914 Unspecified anterior urethral stricture, male: Secondary | ICD-10-CM

## 2020-12-27 LAB — BLADDER SCAN AMB NON-IMAGING

## 2020-12-27 NOTE — Patient Instructions (Signed)
referral placed to [place] at [facility]

## 2020-12-27 NOTE — Progress Notes (Signed)
   12/27/2020 4:05 PM   Carl [name] 1946-07-26 [id number]  Reason for visit: Follow up urethral stricture  HPI: I originally met him on 04/02/2019 when he presented to the ER with recurrent UTIs and urinary retention with a bladder scan over 800 cc and lower abdominal pain. I was able to pass a 1 [place] Coloplast catheter and dilated distal urethral stricture and get the catheter into the bladder with return of clear yellow urine. He passed a voiding trial a week later with a PVR less than 50 mL.  He continued to do well over the next 2 years and at her last follow-up in March 2022 was doing well with low PVR and no significant urinary symptoms.  He was seen 12/13/2020 as an acute visit by one of our PAs with PVR of 600 mL and inability to void, and she was able to place a 16 [place] silicone coud Coloplast catheter with likely dilation of the distal urethral stricture.  He passed a void trial a week later, and PVR is normal again today at 78 mL.  He is having some mild dysuria but otherwise doing well.  Urinalysis today is relatively benign with 6-10 WBCs, 0-2 RBCs, few bacteria.  Will send for culture.  On exam, he has an opening at the ventral aspect of the distal phallus from a prior piercing.  He reports that he has new spraying urinary stream with discharge of urine from both the urethra and the prior piercing on the ventral aspect of the urethra when voiding.  This is very bothersome to him.  He is interested in options for repair.  We long conversation today about his findings.  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  We discussed this may require a more complex reconstruction than we can offer here in [place], and I referred him to Dr. Francesca Jewett at [facility] for consideration of a retrograde urethrogram, cystoscopy, and possible reconstruction.  We discussed return precautions  extensively including gross hematuria or inability to void.  Follow-up urine culture, call with results Referral placed to Dr. Francesca Jewett at [facility] for suspected anterior urethral stricture and fistula  [name], MD  [place] [address] [place], [place] [address] [phone]

## 2020-12-29 LAB — URINALYSIS, COMPLETE
Bilirubin, UA: NEGATIVE
Glucose, UA: NEGATIVE
Leukocytes,UA: NEGATIVE
Nitrite, UA: NEGATIVE
Protein,UA: NEGATIVE
RBC, UA: NEGATIVE
Specific Gravity, UA: 1.02 (ref 1.005–1.030)
Urobilinogen, Ur: 0.2 mg/dL (ref 0.2–1.0)
pH, UA: 5 (ref 5.0–7.5)

## 2020-12-29 LAB — MICROSCOPIC EXAMINATION: Epithelial Cells (non renal): >10 /hpf — AB (ref 0–10)

## 2020-12-31 LAB — CULTURE, URINE COMPREHENSIVE

## 2021-01-02 ENCOUNTER — Telehealth: Payer: Self-pay

## 2021-01-02 NOTE — Telephone Encounter (Signed)
-----   Message from [name], MD sent at 01/02/2021  8:50 AM EDT ----- No infection on urine culture, follow-up with [facility] referral as discussed  Nickolas Madrid, MD 01/02/2021

## 2021-01-02 NOTE — Telephone Encounter (Signed)
Called pt no answer. Left message for pt per DPR. Advised to call back for questions or concerns.

## 2021-05-01 ENCOUNTER — Other Ambulatory Visit: Payer: Self-pay

## 2021-05-01 ENCOUNTER — Emergency Department: Payer: Medicare Other | Admitting: Certified Registered Nurse Anesthetist

## 2021-05-01 ENCOUNTER — Inpatient Hospital Stay: Payer: Medicare Other

## 2021-05-01 ENCOUNTER — Inpatient Hospital Stay
Admission: EM | Admit: 2021-05-01 | Discharge: 2021-05-10 | DRG: 329 | Disposition: A | Discharge status: Home Health | Payer: Medicare Other | Attending: Surgery | Admitting: Surgery

## 2021-05-01 ENCOUNTER — Emergency Department: Payer: Medicare Other

## 2021-05-01 ENCOUNTER — Encounter
Admission: EM | Disposition: A | Discharge status: Home Health | Payer: Self-pay | Source: Home / Self Care | Attending: Surgery

## 2021-05-01 DIAGNOSIS — Z8249 Family history of ischemic heart disease and other diseases of the circulatory system: Secondary | ICD-10-CM | POA: Diagnosis not present

## 2021-05-01 DIAGNOSIS — F05 Delirium due to known physiological condition: Secondary | ICD-10-CM | POA: Diagnosis not present

## 2021-05-01 DIAGNOSIS — G3184 Mild cognitive impairment, so stated: Secondary | ICD-10-CM | POA: Diagnosis present

## 2021-05-01 DIAGNOSIS — Z20822 Contact with and (suspected) exposure to covid-19: Secondary | ICD-10-CM | POA: Diagnosis present

## 2021-05-01 DIAGNOSIS — I69398 Other sequelae of cerebral infarction: Secondary | ICD-10-CM

## 2021-05-01 DIAGNOSIS — K631 Perforation of intestine (nontraumatic): Secondary | ICD-10-CM | POA: Diagnosis not present

## 2021-05-01 DIAGNOSIS — E7800 Pure hypercholesterolemia, unspecified: Secondary | ICD-10-CM | POA: Diagnosis present

## 2021-05-01 DIAGNOSIS — I10 Essential (primary) hypertension: Secondary | ICD-10-CM | POA: Diagnosis present

## 2021-05-01 DIAGNOSIS — M109 Gout, unspecified: Secondary | ICD-10-CM | POA: Diagnosis present

## 2021-05-01 DIAGNOSIS — I69351 Hemiplegia and hemiparesis following cerebral infarction affecting right dominant side: Secondary | ICD-10-CM | POA: Diagnosis not present

## 2021-05-01 DIAGNOSIS — M5030 Other cervical disc degeneration, unspecified cervical region: Secondary | ICD-10-CM | POA: Diagnosis present

## 2021-05-01 DIAGNOSIS — Z6832 Body mass index (BMI) 32.0-32.9, adult: Secondary | ICD-10-CM

## 2021-05-01 DIAGNOSIS — N179 Acute kidney failure, unspecified: Secondary | ICD-10-CM | POA: Diagnosis not present

## 2021-05-01 DIAGNOSIS — Z79899 Other long term (current) drug therapy: Secondary | ICD-10-CM

## 2021-05-01 DIAGNOSIS — M5481 Occipital neuralgia: Secondary | ICD-10-CM | POA: Diagnosis present

## 2021-05-01 DIAGNOSIS — K265 Chronic or unspecified duodenal ulcer with perforation: Secondary | ICD-10-CM | POA: Diagnosis present

## 2021-05-01 DIAGNOSIS — F32A Depression, unspecified: Secondary | ICD-10-CM | POA: Diagnosis present

## 2021-05-01 DIAGNOSIS — R000 Tachycardia, unspecified: Secondary | ICD-10-CM | POA: Diagnosis not present

## 2021-05-01 DIAGNOSIS — Z95828 Presence of other vascular implants and grafts: Secondary | ICD-10-CM

## 2021-05-01 DIAGNOSIS — K659 Peritonitis, unspecified: Secondary | ICD-10-CM | POA: Diagnosis not present

## 2021-05-01 DIAGNOSIS — J9811 Atelectasis: Secondary | ICD-10-CM | POA: Diagnosis not present

## 2021-05-01 DIAGNOSIS — R0600 Dyspnea, unspecified: Secondary | ICD-10-CM

## 2021-05-01 DIAGNOSIS — R9431 Abnormal electrocardiogram [ECG] [EKG]: Secondary | ICD-10-CM | POA: Diagnosis not present

## 2021-05-01 DIAGNOSIS — J9601 Acute respiratory failure with hypoxia: Secondary | ICD-10-CM | POA: Diagnosis not present

## 2021-05-01 DIAGNOSIS — Z978 Presence of other specified devices: Secondary | ICD-10-CM

## 2021-05-01 DIAGNOSIS — K668 Other specified disorders of peritoneum: Secondary | ICD-10-CM | POA: Diagnosis present

## 2021-05-01 DIAGNOSIS — R1084 Generalized abdominal pain: Secondary | ICD-10-CM

## 2021-05-01 DIAGNOSIS — E669 Obesity, unspecified: Secondary | ICD-10-CM | POA: Diagnosis present

## 2021-05-01 DIAGNOSIS — F419 Anxiety disorder, unspecified: Secondary | ICD-10-CM | POA: Diagnosis present

## 2021-05-01 DIAGNOSIS — F1020 Alcohol dependence, uncomplicated: Secondary | ICD-10-CM | POA: Diagnosis present

## 2021-05-01 DIAGNOSIS — R0602 Shortness of breath: Secondary | ICD-10-CM

## 2021-05-01 DIAGNOSIS — Z87442 Personal history of urinary calculi: Secondary | ICD-10-CM

## 2021-05-01 DIAGNOSIS — K650 Generalized (acute) peritonitis: Secondary | ICD-10-CM | POA: Diagnosis present

## 2021-05-01 DIAGNOSIS — K660 Peritoneal adhesions (postprocedural) (postinfection): Secondary | ICD-10-CM | POA: Diagnosis present

## 2021-05-01 DIAGNOSIS — R198 Other specified symptoms and signs involving the digestive system and abdomen: Secondary | ICD-10-CM

## 2021-05-01 DIAGNOSIS — I5030 Unspecified diastolic (congestive) heart failure: Secondary | ICD-10-CM | POA: Diagnosis not present

## 2021-05-01 DIAGNOSIS — Z01810 Encounter for preprocedural cardiovascular examination: Secondary | ICD-10-CM | POA: Diagnosis not present

## 2021-05-01 DIAGNOSIS — Z87891 Personal history of nicotine dependence: Secondary | ICD-10-CM | POA: Diagnosis not present

## 2021-05-01 DIAGNOSIS — M47812 Spondylosis without myelopathy or radiculopathy, cervical region: Secondary | ICD-10-CM | POA: Diagnosis present

## 2021-05-01 HISTORY — PX: REPAIR OF PERFORATED ULCER: SHX6065

## 2021-05-01 HISTORY — PX: XI ROBOT ASSISTED DIAGNOSTIC LAPAROSCOPY: SHX6815

## 2021-05-01 LAB — COMPREHENSIVE METABOLIC PANEL
ALT: 10 U/L (ref 0–44)
AST: 21 U/L (ref 15–41)
Albumin: 3.7 g/dL (ref 3.5–5.0)
Alkaline Phosphatase: 54 U/L (ref 38–126)
Anion gap: 12 (ref 5–15)
BUN: 34 mg/dL — ABNORMAL HIGH (ref 8–23)
CO2: 21 mmol/L — ABNORMAL LOW (ref 22–32)
Calcium: 9.3 mg/dL (ref 8.9–10.3)
Chloride: 107 mmol/L (ref 98–111)
Creatinine, Ser: 1.9 mg/dL — ABNORMAL HIGH (ref 0.61–1.24)
GFR, Estimated: 37 mL/min — ABNORMAL LOW (ref >60)
Glucose, Bld: 162 mg/dL — ABNORMAL HIGH (ref 70–99)
Potassium: 4.2 mmol/L (ref 3.5–5.1)
Sodium: 140 mmol/L (ref 135–145)
Total Bilirubin: 0.7 mg/dL (ref 0.3–1.2)
Total Protein: 7.1 g/dL (ref 6.5–8.1)

## 2021-05-01 LAB — CBC
HCT: 40.3 % (ref 39.0–52.0)
Hemoglobin: 13.5 g/dL (ref 13.0–17.0)
MCH: 29 pg (ref 26.0–34.0)
MCHC: 33.5 g/dL (ref 30.0–36.0)
MCV: 86.7 fL (ref 80.0–100.0)
Platelets: 292 10*3/uL (ref 150–400)
RBC: 4.65 MIL/uL (ref 4.22–5.81)
RDW: 12.7 % (ref 11.5–15.5)
WBC: 6.9 10*3/uL (ref 4.0–10.5)
nRBC: 0 % (ref 0.0–0.2)

## 2021-05-01 LAB — RESP PANEL BY RT-PCR (FLU A&B, COVID) ARPGX2
Influenza A by PCR: NEGATIVE
Influenza B by PCR: NEGATIVE
SARS Coronavirus 2 by RT PCR: NEGATIVE

## 2021-05-01 LAB — TYPE AND SCREEN
ABO/RH(D): A POS
Antibody Screen: NEGATIVE

## 2021-05-01 LAB — TROPONIN I (HIGH SENSITIVITY): Troponin I (High Sensitivity): 16 ng/L (ref <18)

## 2021-05-01 LAB — MRSA NEXT GEN BY PCR, NASAL: MRSA by PCR Next Gen: NOT DETECTED

## 2021-05-01 LAB — LIPASE, BLOOD: Lipase: 117 U/L — ABNORMAL HIGH (ref 11–51)

## 2021-05-01 LAB — LACTIC ACID, PLASMA: Lactic Acid, Venous: 1.5 mmol/L (ref 0.5–1.9)

## 2021-05-01 SURGERY — LAPAROSCOPY, DIAGNOSTIC, ROBOT-ASSISTED
Anesthesia: General

## 2021-05-01 MED ORDER — PANTOPRAZOLE SODIUM 40 MG IV SOLR
40.0000 mg | Freq: Two times a day (BID) | INTRAVENOUS | Status: DC
  Administered 2021-05-01 – 2021-05-10 (×18): 40 mg via INTRAVENOUS
  Filled 2021-05-01 (×18): qty 40

## 2021-05-01 MED ORDER — PROPOFOL 10 MG/ML IV BOLUS
INTRAVENOUS | Status: DC | PRN
  Administered 2021-05-01: 130 mg via INTRAVENOUS

## 2021-05-01 MED ORDER — PIPERACILLIN-TAZOBACTAM 3.375 G IVPB: 3.3750 g | Freq: Three times a day (TID) | INTRAVENOUS | Status: DC

## 2021-05-01 MED ORDER — METOPROLOL TARTRATE 5 MG/5ML IV SOLN: 5.0000 mg | Freq: Four times a day (QID) | INTRAVENOUS | Status: DC | PRN

## 2021-05-01 MED ORDER — KETAMINE HCL 50 MG/5ML IJ SOSY
PREFILLED_SYRINGE | INTRAMUSCULAR | Status: AC
  Filled 2021-05-01: qty 5

## 2021-05-01 MED ORDER — 0.9 % SODIUM CHLORIDE (POUR BTL) OPTIME
TOPICAL | Status: DC | PRN
  Administered 2021-05-01: 500 mL

## 2021-05-01 MED ORDER — ACETAMINOPHEN 10 MG/ML IV SOLN: 1000.0000 mg | Freq: Once | INTRAVENOUS | Status: DC | PRN

## 2021-05-01 MED ORDER — ONDANSETRON HCL 4 MG/2ML IJ SOLN
4.0000 mg | Freq: Four times a day (QID) | INTRAMUSCULAR | Status: DC | PRN
  Administered 2021-05-02 – 2021-05-06 (×2): 4 mg via INTRAVENOUS
  Filled 2021-05-01 (×2): qty 2

## 2021-05-01 MED ORDER — MORPHINE SULFATE (PF) 4 MG/ML IV SOLN
4.0000 mg | INTRAVENOUS | Status: DC | PRN
  Administered 2021-05-01: 4 mg via INTRAVENOUS
  Filled 2021-05-01: qty 1

## 2021-05-01 MED ORDER — PHENYLEPHRINE HCL (PRESSORS) 10 MG/ML IV SOLN
INTRAVENOUS | Status: DC | PRN
  Administered 2021-05-01: 200 ug via INTRAVENOUS
  Administered 2021-05-01 (×2): 100 ug via INTRAVENOUS

## 2021-05-01 MED ORDER — IOHEXOL 350 MG/ML SOLN
60.0000 mL | Freq: Once | INTRAVENOUS | Status: AC | PRN
  Administered 2021-05-01: 60 mL via INTRAVENOUS

## 2021-05-01 MED ORDER — ONDANSETRON HCL 4 MG/2ML IJ SOLN: 4.0000 mg | Freq: Four times a day (QID) | INTRAMUSCULAR | Status: DC | PRN

## 2021-05-01 MED ORDER — PIPERACILLIN-TAZOBACTAM 3.375 G IVPB
3.3750 g | Freq: Three times a day (TID) | INTRAVENOUS | Status: AC
Start: 2021-05-01 — End: 2021-05-08
  Administered 2021-05-01 – 2021-05-08 (×21): 3.375 g via INTRAVENOUS
  Filled 2021-05-01 (×21): qty 50

## 2021-05-01 MED ORDER — CHLORHEXIDINE GLUCONATE CLOTH 2 % EX PADS
6.0000 | MEDICATED_PAD | Freq: Every day | CUTANEOUS | Status: DC
  Administered 2021-05-01 – 2021-05-10 (×9): 6 via TOPICAL

## 2021-05-01 MED ORDER — MORPHINE SULFATE (PF) 4 MG/ML IV SOLN
4.0000 mg | INTRAVENOUS | Status: DC | PRN
  Administered 2021-05-01 – 2021-05-07 (×13): 4 mg via INTRAVENOUS
  Filled 2021-05-01 (×14): qty 1

## 2021-05-01 MED ORDER — KETAMINE HCL 10 MG/ML IJ SOLN
INTRAMUSCULAR | Status: DC | PRN
  Administered 2021-05-01: 20 mg via INTRAVENOUS

## 2021-05-01 MED ORDER — PROPOFOL 10 MG/ML IV BOLUS
INTRAVENOUS | Status: AC
  Filled 2021-05-01: qty 20

## 2021-05-01 MED ORDER — ONDANSETRON HCL 4 MG/2ML IJ SOLN
4.0000 mg | Freq: Once | INTRAMUSCULAR | Status: AC
  Administered 2021-05-01: 4 mg via INTRAVENOUS
  Filled 2021-05-01: qty 2

## 2021-05-01 MED ORDER — SUGAMMADEX SODIUM 200 MG/2ML IV SOLN
INTRAVENOUS | Status: DC | PRN
  Administered 2021-05-01: 200 mg via INTRAVENOUS

## 2021-05-01 MED ORDER — PHENYLEPHRINE HCL (PRESSORS) 10 MG/ML IV SOLN
INTRAVENOUS | Status: AC
  Filled 2021-05-01: qty 1

## 2021-05-01 MED ORDER — OXYCODONE HCL 5 MG/5ML PO SOLN: 5.0000 mg | Freq: Once | ORAL | Status: DC | PRN

## 2021-05-01 MED ORDER — BUPIVACAINE-EPINEPHRINE (PF) 0.25% -1:200000 IJ SOLN
INTRAMUSCULAR | Status: AC
  Filled 2021-05-01: qty 60

## 2021-05-01 MED ORDER — SODIUM CHLORIDE 0.9 % IV BOLUS: 500.0000 mL | Freq: Once | INTRAVENOUS | Status: DC

## 2021-05-01 MED ORDER — SODIUM CHLORIDE 0.9 % IV SOLN
1.0000 g | Freq: Once | INTRAVENOUS | Status: DC
  Administered 2021-05-01: 1 g via INTRAVENOUS
  Filled 2021-05-01: qty 10

## 2021-05-01 MED ORDER — SODIUM CHLORIDE 0.9 % IV SOLN
INTRAVENOUS | Status: DC | PRN
  Administered 2021-05-01: 50 ug/min via INTRAVENOUS

## 2021-05-01 MED ORDER — ENOXAPARIN SODIUM 40 MG/0.4ML IJ SOSY
40.0000 mg | PREFILLED_SYRINGE | INTRAMUSCULAR | Status: DC
  Administered 2021-05-02 – 2021-05-10 (×9): 40 mg via SUBCUTANEOUS
  Filled 2021-05-01 (×9): qty 0.4

## 2021-05-01 MED ORDER — LIDOCAINE HCL (CARDIAC) PF 100 MG/5ML IV SOSY
PREFILLED_SYRINGE | INTRAVENOUS | Status: DC | PRN
  Administered 2021-05-01: 100 mg via INTRAVENOUS

## 2021-05-01 MED ORDER — MIDAZOLAM HCL 2 MG/2ML IJ SOLN
INTRAMUSCULAR | Status: AC
  Filled 2021-05-01: qty 2

## 2021-05-01 MED ORDER — ROCURONIUM BROMIDE 100 MG/10ML IV SOLN
INTRAVENOUS | Status: DC | PRN
  Administered 2021-05-01 (×2): 20 mg via INTRAVENOUS
  Administered 2021-05-01: 10 mg via INTRAVENOUS
  Administered 2021-05-01: 40 mg via INTRAVENOUS
  Administered 2021-05-01: 20 mg via INTRAVENOUS

## 2021-05-01 MED ORDER — LIDOCAINE HCL (PF) 2 % IJ SOLN
INTRAMUSCULAR | Status: AC
  Filled 2021-05-01: qty 5

## 2021-05-01 MED ORDER — FENTANYL CITRATE (PF) 100 MCG/2ML IJ SOLN: 25.0000 ug | INTRAMUSCULAR | Status: DC | PRN

## 2021-05-01 MED ORDER — EPHEDRINE 5 MG/ML INJ
INTRAVENOUS | Status: AC
  Filled 2021-05-01: qty 5

## 2021-05-01 MED ORDER — ROCURONIUM BROMIDE 10 MG/ML (PF) SYRINGE
PREFILLED_SYRINGE | INTRAVENOUS | Status: AC
  Filled 2021-05-01: qty 10

## 2021-05-01 MED ORDER — OXYCODONE HCL 5 MG PO TABS
5.0000 mg | ORAL_TABLET | Freq: Once | ORAL | Status: DC | PRN
Start: 2021-05-01 — End: 2021-05-01

## 2021-05-01 MED ORDER — VASOPRESSIN 20 UNIT/ML IV SOLN
INTRAVENOUS | Status: DC | PRN
  Administered 2021-05-01 (×7): 2 [IU] via INTRAVENOUS
  Administered 2021-05-01 (×2): 1 [IU] via INTRAVENOUS
  Administered 2021-05-01: 2 [IU] via INTRAVENOUS

## 2021-05-01 MED ORDER — SODIUM CHLORIDE 0.9 % IV BOLUS
1000.0000 mL | Freq: Once | INTRAVENOUS | Status: AC
  Administered 2021-05-01: 1000 mL via INTRAVENOUS

## 2021-05-01 MED ORDER — HYDRALAZINE HCL 20 MG/ML IJ SOLN: 10.0000 mg | INTRAMUSCULAR | Status: DC | PRN

## 2021-05-01 MED ORDER — VASOPRESSIN 20 UNIT/ML IV SOLN
INTRAVENOUS | Status: AC
  Filled 2021-05-01: qty 1

## 2021-05-01 MED ORDER — ACETAMINOPHEN 10 MG/ML IV SOLN
INTRAVENOUS | Status: DC | PRN
  Administered 2021-05-01: 1000 mg via INTRAVENOUS

## 2021-05-01 MED ORDER — ONDANSETRON 4 MG PO TBDP: 4.0000 mg | ORAL_TABLET | Freq: Four times a day (QID) | ORAL | Status: DC | PRN

## 2021-05-01 MED ORDER — ONDANSETRON HCL 4 MG/2ML IJ SOLN
INTRAMUSCULAR | Status: DC | PRN
  Administered 2021-05-01: 4 mg via INTRAVENOUS

## 2021-05-01 MED ORDER — ACETAMINOPHEN 10 MG/ML IV SOLN
INTRAVENOUS | Status: AC
  Filled 2021-05-01: qty 100

## 2021-05-01 MED ORDER — BUPIVACAINE LIPOSOME 1.3 % IJ SUSP
INTRAMUSCULAR | Status: AC
  Filled 2021-05-01: qty 20

## 2021-05-01 MED ORDER — ALBUMIN HUMAN 5 % IV SOLN: INTRAVENOUS | Status: DC | PRN

## 2021-05-01 MED ORDER — METRONIDAZOLE 500 MG/100ML IV SOLN
500.0000 mg | Freq: Once | INTRAVENOUS | Status: DC
  Filled 2021-05-01: qty 100

## 2021-05-01 MED ORDER — METHYLENE BLUE 0.5 % INJ SOLN
INTRAVENOUS | Status: AC
  Filled 2021-05-01: qty 10

## 2021-05-01 MED ORDER — DEXAMETHASONE SODIUM PHOSPHATE 10 MG/ML IJ SOLN
INTRAMUSCULAR | Status: DC | PRN
  Administered 2021-05-01: 10 mg via INTRAVENOUS

## 2021-05-01 MED ORDER — PROCHLORPERAZINE EDISYLATE 10 MG/2ML IJ SOLN
5.0000 mg | Freq: Four times a day (QID) | INTRAMUSCULAR | Status: DC | PRN
  Filled 2021-05-01: qty 2
  Filled 2021-05-01 (×2): qty 1

## 2021-05-01 MED ORDER — BUPIVACAINE LIPOSOME 1.3 % IJ SUSP
INTRAMUSCULAR | Status: DC | PRN
  Administered 2021-05-01: 20 mL

## 2021-05-01 MED ORDER — SUCCINYLCHOLINE CHLORIDE 200 MG/10ML IV SOSY
PREFILLED_SYRINGE | INTRAVENOUS | Status: DC | PRN
  Administered 2021-05-01: 100 mg via INTRAVENOUS

## 2021-05-01 MED ORDER — ONDANSETRON HCL 4 MG/2ML IJ SOLN: 4.0000 mg | Freq: Once | INTRAMUSCULAR | Status: DC | PRN

## 2021-05-01 MED ORDER — BUPIVACAINE-EPINEPHRINE (PF) 0.25% -1:200000 IJ SOLN
INTRAMUSCULAR | Status: DC | PRN
  Administered 2021-05-01: 30 mL

## 2021-05-01 MED ORDER — FENTANYL CITRATE (PF) 250 MCG/5ML IJ SOLN
INTRAMUSCULAR | Status: AC
  Filled 2021-05-01: qty 5

## 2021-05-01 MED ORDER — HYDROMORPHONE HCL 1 MG/ML IJ SOLN
0.5000 mg | INTRAMUSCULAR | Status: DC | PRN
  Administered 2021-05-01: 0.5 mg via INTRAVENOUS
  Filled 2021-05-01: qty 1

## 2021-05-01 MED ORDER — SODIUM CHLORIDE 0.9 % IV BOLUS: 1000.0000 mL | Freq: Once | INTRAVENOUS | Status: DC

## 2021-05-01 MED ORDER — SODIUM CHLORIDE 0.9 % IV SOLN: INTRAVENOUS | Status: AC

## 2021-05-01 MED ORDER — LACTATED RINGERS IV SOLN: INTRAVENOUS | Status: DC | PRN

## 2021-05-01 MED ORDER — PROCHLORPERAZINE MALEATE 10 MG PO TABS
10.0000 mg | ORAL_TABLET | Freq: Four times a day (QID) | ORAL | Status: DC | PRN
  Filled 2021-05-01: qty 1

## 2021-05-01 MED ORDER — EPHEDRINE SULFATE 50 MG/ML IJ SOLN
INTRAMUSCULAR | Status: DC | PRN
Start: 2021-05-01 — End: 2021-05-01
  Administered 2021-05-01: 10 mg via INTRAVENOUS
  Administered 2021-05-01 (×7): 5 mg via INTRAVENOUS

## 2021-05-01 MED ORDER — DEXAMETHASONE SODIUM PHOSPHATE 10 MG/ML IJ SOLN
INTRAMUSCULAR | Status: AC
  Filled 2021-05-01: qty 1

## 2021-05-01 MED ORDER — SODIUM CHLORIDE 0.9 % IV SOLN: INTRAVENOUS | Status: DC

## 2021-05-01 MED ORDER — OXYCODONE HCL 5 MG PO TABS: 5.0000 mg | ORAL_TABLET | ORAL | Status: DC | PRN

## 2021-05-01 MED ORDER — ONDANSETRON HCL 4 MG/2ML IJ SOLN
INTRAMUSCULAR | Status: AC
  Filled 2021-05-01: qty 2

## 2021-05-01 MED ORDER — ONDANSETRON 4 MG PO TBDP
4.0000 mg | ORAL_TABLET | Freq: Four times a day (QID) | ORAL | Status: DC | PRN
  Filled 2021-05-01: qty 1

## 2021-05-01 MED ORDER — FENTANYL CITRATE (PF) 100 MCG/2ML IJ SOLN
INTRAMUSCULAR | Status: DC | PRN
  Administered 2021-05-01: 100 ug via INTRAVENOUS

## 2021-05-01 MED ORDER — SUCCINYLCHOLINE CHLORIDE 200 MG/10ML IV SOSY
PREFILLED_SYRINGE | INTRAVENOUS | Status: AC
  Filled 2021-05-01: qty 10

## 2021-05-01 MED ORDER — SODIUM CHLORIDE 0.9 % IR SOLN
Status: DC | PRN
  Administered 2021-05-01: 2000 mL

## 2021-05-01 SURGICAL SUPPLY — 69 items
BLADE SURG SZ10 CARB STEEL (BLADE) ×3 IMPLANT
BULB RESERV EVAC DRAIN JP 100C (MISCELLANEOUS) ×3 IMPLANT
CANNULA REDUC XI 12-8 STAPL (CANNULA) ×3
CANNULA REDUCER 12-8 DVNC XI (CANNULA) ×2 IMPLANT
CHLORAPREP W/TINT 26 (MISCELLANEOUS) ×3 IMPLANT
CLIP VESOLOCK MED LG 6/CT (CLIP) ×3 IMPLANT
COVER TIP SHEARS 8 DVNC (MISCELLANEOUS) ×2 IMPLANT
COVER TIP SHEARS 8MM DA VINCI (MISCELLANEOUS) ×3
DEFOGGER SCOPE WARMER CLEARIFY (MISCELLANEOUS) ×3 IMPLANT
DERMABOND ADVANCED (GAUZE/BANDAGES/DRESSINGS)
DERMABOND ADVANCED .7 DNX12 (GAUZE/BANDAGES/DRESSINGS) IMPLANT
DRAIN CHANNEL JP 19F (MISCELLANEOUS) ×3 IMPLANT
DRAPE 3/4 80X56 (DRAPES) ×3 IMPLANT
DRAPE ARM DVNC X/XI (DISPOSABLE) ×8 IMPLANT
DRAPE COLUMN DVNC XI (DISPOSABLE) ×2 IMPLANT
DRAPE DA VINCI XI ARM (DISPOSABLE) ×12
DRAPE DA VINCI XI COLUMN (DISPOSABLE) ×3
DRAPE LAPAROTOMY 100X77 ABD (DRAPES) ×3 IMPLANT
DRSG TEGADERM 2-3/8X2-3/4 SM (GAUZE/BANDAGES/DRESSINGS) ×9 IMPLANT
DRSG TEGADERM 4X4.75 (GAUZE/BANDAGES/DRESSINGS) ×6 IMPLANT
ELECT CAUTERY BLADE 6.4 (BLADE) ×3 IMPLANT
ELECT REM PT RETURN 9FT ADLT (ELECTROSURGICAL) ×3
ELECTRODE REM PT RTRN 9FT ADLT (ELECTROSURGICAL) ×2 IMPLANT
GAUZE SPONGE 4X4 12PLY STRL (GAUZE/BANDAGES/DRESSINGS) ×3 IMPLANT
GLOVE SURG ENC MOIS LTX SZ7 (GLOVE) ×9 IMPLANT
GOWN STRL REUS W/ TWL LRG LVL3 (GOWN DISPOSABLE) ×10 IMPLANT
GOWN STRL REUS W/TWL LRG LVL3 (GOWN DISPOSABLE) ×15
GRASPER LAPSCPC 5X45 DSP (INSTRUMENTS) ×3 IMPLANT
HANDLE SUCTION POOLE (INSTRUMENTS) ×2 IMPLANT
IRRIGATION STRYKERFLOW (MISCELLANEOUS) ×2 IMPLANT
IRRIGATOR STRYKERFLOW (MISCELLANEOUS) ×3
IV NS 1000ML (IV SOLUTION) ×3
IV NS 1000ML BAXH (IV SOLUTION) ×2 IMPLANT
KIT PINK PAD W/HEAD ARE REST (MISCELLANEOUS) ×3 IMPLANT
KIT PINK PAD W/HEAD ARM REST (MISCELLANEOUS) ×2 IMPLANT
LABEL OR SOLS (LABEL) ×3 IMPLANT
MANIFOLD NEPTUNE II (INSTRUMENTS) ×3 IMPLANT
NEEDLE HYPO 22GX1.5 SAFETY (NEEDLE) ×3 IMPLANT
NS IRRIG 500ML POUR BTL (IV SOLUTION) ×3 IMPLANT
OBTURATOR OPTICAL STANDARD 8MM (TROCAR) ×3
OBTURATOR OPTICAL STND 8 DVNC (TROCAR) ×2
OBTURATOR OPTICALSTD 8 DVNC (TROCAR) ×2 IMPLANT
PACK BASIN MAJOR ARMC (MISCELLANEOUS) ×3 IMPLANT
PACK LAP CHOLECYSTECTOMY (MISCELLANEOUS) ×3 IMPLANT
PENCIL ELECTRO HAND CTR (MISCELLANEOUS) ×3 IMPLANT
SEAL CANN UNIV 5-8 DVNC XI (MISCELLANEOUS) ×6 IMPLANT
SEAL XI 5MM-8MM UNIVERSAL (MISCELLANEOUS) ×9
SOLUTION ELECTROLUBE (MISCELLANEOUS) ×3 IMPLANT
SPONGE DRAIN TRACH 4X4 STRL 2S (GAUZE/BANDAGES/DRESSINGS) ×3 IMPLANT
SPONGE GAUZE 2X2 8PLY STRL LF (GAUZE/BANDAGES/DRESSINGS) ×9 IMPLANT
STAPLER CANNULA SEAL DVNC XI (STAPLE) ×2 IMPLANT
STAPLER CANNULA SEAL XI (STAPLE) ×3
SUCTION POOLE HANDLE (INSTRUMENTS) ×3
SUT ETHILON 3-0 FS-10 30 BLK (SUTURE) ×3
SUT MNCRL AB 4-0 PS2 18 (SUTURE) ×3 IMPLANT
SUT SILK 2 0 (SUTURE) ×3
SUT SILK 2 0 SH (SUTURE) ×9 IMPLANT
SUT SILK 2-0 18XBRD TIE 12 (SUTURE) ×2 IMPLANT
SUT VIC AB 0 CT1 36 (SUTURE) ×6 IMPLANT
SUT VLOC 90 S/L VL9 GS22 (SUTURE) ×3 IMPLANT
SUTURE EHLN 3-0 FS-10 30 BLK (SUTURE) ×2 IMPLANT
SYR 20ML LL LF (SYRINGE) ×6 IMPLANT
SYR 30ML LL (SYRINGE) ×3 IMPLANT
SYS TROCAR 1.5-3 SLV ABD GEL (ENDOMECHANICALS) ×3
SYSTEM TROCR 1.5-3 SLV ABD GEL (ENDOMECHANICALS) ×2 IMPLANT
TRAY FOLEY SLVR 16FR LF STAT (SET/KITS/TRAYS/PACK) ×3 IMPLANT
TROCAR ADV FIXATION 12X100MM (TROCAR) ×3 IMPLANT
TROCAR XCEL NON-BLD 5MMX100MML (ENDOMECHANICALS) ×3 IMPLANT
WATER STERILE IRR 500ML POUR (IV SOLUTION) ×3 IMPLANT

## 2021-05-01 NOTE — Anesthesia Preprocedure Evaluation (Signed)
Anesthesia Evaluation  Patient identified by MRN, date of birth, ID band Patient awake  General Assessment Comment:Patient appears in pain, uncomfortable  Reviewed: Allergy & Precautions, H&P , NPO status , Patient's Chart, lab work & pertinent test results, reviewed documented beta blocker date and time   History of Anesthesia Complications Negative for: history of anesthetic complications  Airway Mallampati: II  TM Distance: >3 FB Neck ROM: full    Dental  (+) Dental Advidsory Given, Caps, Missing, Poor Dentition   Pulmonary neg pulmonary ROS, former smoker,    Pulmonary exam normal breath sounds clear to auscultation       Cardiovascular Exercise Tolerance: Good hypertension, Pt. on medications (-) angina(-) CAD, (-) Past MI, (-) Cardiac Stents and (-) CABG (-) dysrhythmias + Valvular Problems/Murmurs  Rhythm:Regular Rate:Normal - Systolic murmurs    Neuro/Psych  Headaches, neg Seizures PSYCHIATRIC DISORDERS Anxiety Depression Residual right sided weakness  Neuromuscular disease CVA, Residual Symptoms    GI/Hepatic PUD, neg GERD  ,(+)     (-) substance abuse  , No alcohol use in years, per patient   Endo/Other  negative endocrine ROS  Renal/GU Renal diseasenegative Renal ROS  negative genitourinary   Musculoskeletal   Abdominal   Peds  Hematology negative hematology ROS (+)   Anesthesia Other Findings Past Medical History: No date: Alcoholism ([place])     Comment:  still drinking wine as of Feb 2013 No date: Anxiety and depression No date: Cataract No date: Cervical spondylosis without myelopathy No date: Cervicalgia of occipito-atlanto-axial region No date: Chronic pain disorder No date: Degeneration of cervical intervertebral disc 2011: Duodenal ulcer No date: Facet arthropathy, cervical No date: Fuchs' corneal dystrophy 2011: Gastric ulcer     Comment:  egd at [place] Aug 2011. No date:  GI bleed No date: Gout No date: Hypertension No date: Impaired fasting glucose No date: Intractable 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 status  migrainosus No date: LVH (left ventricular hypertrophy) No date: Mitral insufficiency No date: Neck pain No date: Neuralgia and neuritis No date: Nocturia No date: Occipital neuralgia No date: Osteoarthritis     Comment:  neck No date: Osteoarthritis No date: Pure hypercholesterolemia No date: Radiculitis No date: Stroke ([place]) No date: Tricuspid insufficiency   Reproductive/Obstetrics negative OB ROS                             Anesthesia Physical  Anesthesia Plan  ASA: 3  Anesthesia Plan: General   Post-op Pain Management:    Induction: Intravenous  PONV Risk Score and Plan: 4 or greater and Ondansetron, Dexamethasone and Treatment may vary due to age or medical condition  Airway Management Planned: Oral ETT and Video Laryngoscope Planned  Additional Equipment: None  Intra-op Plan:   Post-operative Plan: Extubation in OR and Possible Post-op intubation/ventilation  Informed Consent: I have reviewed the patients History and Physical, chart, labs and discussed the procedure including the risks, benefits and alternatives for the proposed anesthesia with the patient or authorized representative who has indicated his/her understanding and acceptance.     Dental Advisory Given  Plan Discussed with: Anesthesiologist, CRNA and Surgeon  Anesthesia Plan Comments: (Discussed risks of anesthesia with patient and wife at bedside, including PONV, sore throat, lip/dental damage. Rare risks discussed as well, such as cardiorespiratory and neurological sequelae, prolonged intubation. Patient understands.)        Anesthesia Quick Evaluation

## 2021-05-01 NOTE — Progress Notes (Signed)
Spoke with Dr Perrin Maltese about LLQ where JP drain is located about leaking and he said that was normal and can change dressing prn.

## 2021-05-01 NOTE — Transfer of Care (Signed)
Immediate Anesthesia Transfer of Care Note  Patient: Carl [name]  Procedure(s) Performed: XI ROBOT ASSISTED DIAGNOSTIC LAPAROSCOPY ROBOTIC REPAIR OF PERFORATED DUODENAL ULCER  Patient Location: PACU  Anesthesia Type:General  Level of Consciousness: drowsy  Airway & Oxygen Therapy: Patient Spontanous Breathing and Patient connected to face mask oxygen  Post-op Assessment: Report given to RN and Post -op Vital signs reviewed and stable  Post vital signs: Reviewed and stable  Last Vitals:  Vitals Value Taken Time  BP 110/72 05/01/21 1845  Temp 35.9 C 05/01/21 1840  Pulse 84 05/01/21 1848  Resp 25 05/01/21 1848  SpO2 98 % 05/01/21 1848  Vitals shown include unvalidated device data.  Last Pain:  Vitals:   05/01/21 1840  TempSrc:   PainSc: 0-No pain         Complications: No notable events documented.

## 2021-05-01 NOTE — Op Note (Signed)
Robotic assisted laparoscopic Repair of perforated duodenal ulcer with omental flap to reinforce closure  Pre-operative Diagnosis: Pneumoperitoneum and acute abdomen  Post-operative Diagnosis: Perforated duodenal ulcer, 2 cm defect between first and second portion  Procedure:  Robotic assisted laparoscopic Repair of perforated duodenal ulcer with omental flap / Pexy to reinforce closure  1st Assistant: Otho Ket PA-C Required due to complexity of the case and for exposure  Surgeon: Caroleen Hamman, MD FACS  Anesthesia: Gen. with endotracheal tube  Findings: Perforated duodenal ulcer, 2 cm defect between first and second portion Purulent peritonitis  Estimated Blood Loss: 15cc              Complications: none  Procedure Details  The patient was seen again in the Holding Room. The benefits, complications, treatment options, and expected outcomes were discussed with the patient. The risks of bleeding, infection, recurrence of symptoms, failure to resolve symptoms, bile duct damage,  bowel injury, any of which could require further surgery and/ were reviewed with the patient.  The patient and/or family concurred with the proposed plan, giving informed consent.  The patient was taken to Operating Room, identified  and the procedure verified  A Time Out was held and the above information confirmed.  Prior to the induction of general anesthesia, antibiotic prophylaxis was administered. VTE prophylaxis was in place. General endotracheal anesthesia was then administered and tolerated well. After the induction, the abdomen was prepped with Chloraprep and draped in the sterile fashion. The patient was positioned in the supine position.  Cut down technique was used to enter the abdominal cavity and a Hasson trochar was placed after two vicryl stitches were anchored to the fascia. Pneumoperitoneum was then created with CO2 and tolerated well without any adverse changes in the patient's vital signs.   Three 8-mm ports were placed under direct vision.  An additional 5 mm excel port was placed on the left lower quadrant to improve exposure. The procedure was challenging due to the severe inflammatory reaction on the right upper quadrant this took significant time and we did perform lysis of adhesions taking at least 45 minutes of total operative time. The patient was positioned  in reverse Trendelenburg, robot was brought to the surgical field and docked in the standard fashion.  We made sure all the instrumentation was kept indirect view at all times and that there were no collision between the arms. I scrubbed out and went to the console.  The gallbladder was identified, as well as the antrum and duodenum. There was evidence of copious amount of purulent ascites and  bile. Omentum was stuck to the duodenum upon further dissection we identified a large defect on the lateral and free edge of the duodenum. It was a large defect. I closed the defect with multiple interrupted 2-0 silks and reinforced it w a running lembert using 2-0 V lock. Air and water tight closure was achieved. No evidence of further bile leak was identified. Copious irrigation performed w two large bags until aspirate was clear. Greater omentum was mobilized and a tongue of omentum was mobilized as a pedicle and secured to the repair using interrupted silk sutures ( omentopexy).  62 blake drain placed on top of the duodenal repair and secured to the skin.  There was excellent hemostasis/.. Inspection of the right upper quadrant was performed. No bleeding, bile duct injury or leak, or bowel injury was noted.  Robotic instruments and robotic arms were undocked in the standard fashion.  I scrubbed back in.  Pneumoperitoneum was released.  The periumbilical port site was closed with interrumpted 0 Vicryl sutures. 4-0 subcuticular Monocryl was used to close the skin. Dermabond was  applied.  Liposomal marcaine was infiltrated on all  incision sites.The patient was then extubated and brought to the recovery room in stable condition. Sponge, lap, and needle counts were correct at closure and at the conclusion of the case.               Caroleen Hamman, MD, FACS

## 2021-05-01 NOTE — H&P (Addendum)
[facility] SUR[facility] H[facility]ORY & PHYSICAL (cpt [phone])  HISTORY OF PRESENT ILLNESS (HPI):  75 y.o. male presented to [facility] ED today for abdominal pain. Patient reports the acute onset of severe abdominal pain around 11 AM which seems to be worse in the RUQ and radiates to his right shoulder. Pain has been severe and constant in nature. Nothing seems to make this better. Pain worse with ambulation. Some nausea. No fever, chills, CP, SOB, urinary changes, or bowel changes. He does reportedly have a history of perforated ulcer in 2011 but this was managed conservatively. Last EGD was in 2018 with Dr Allen Norris which showed gastric ulceration. He is not on any preventative medications. Does not appear to have any history of NSAID abuse in recent history. He does have a history of alcohol abuse in the past but has not had a drink in "many years" per his wife. Work up in the ED showed a normal WBC at 6.9K, hgb normal at 13.5, slight AKI with sCr - 1.90. CXR was concerning for pneumoperitoneum and follow up CT Abdomen/Pelvis was concerning for similar.   General surgery is consulted by emergency medicine physician Dr Merlyn Lot, MD for evaluation and management of pneumoperitoneum.   PAST MEDICAL HISTORY (PMH):  Past Medical History:  Diagnosis Date   Adenoma of colon    Alcoholism ([place])    still drinking wine as of Feb 2013   Anxiety and depression    Cataract    Cervical spondylosis without myelopathy    Cervicalgia of occipito-atlanto-axial region    Chronic pain disorder    Degeneration of cervical intervertebral disc    Degeneration, intervertebral disc, cervical    Duodenal ulcer 2011   Facet arthropathy, cervical    Fuchs' corneal dystrophy    Gastric ulcer 2011   egd at [place] Aug 2011.   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     LVH (left ventricular hypertrophy)    Mild cognitive impairment    Mitral insufficiency    Neck pain    Neuralgia and neuritis    Nocturia    Occipital neuralgia    Osteoarthritis    neck   Osteoarthritis    Pure hypercholesterolemia    Radiculitis    Stroke ([place])    aphasia, slight right sided weakness   Tricuspid insufficiency     Reviewed. Otherwise negative.   PAST SURGICAL HISTORY [facility]):  Past Surgical History:  Procedure Laterality Date   COLONOSCOPY WITH PROPOFOL N/A 06/05/2018   Procedure: COLONOSCOPY WITH PROPOFOL;  Surgeon: Manya Silvas, MD;  Location: [facility] ENDOSCOPY;  Service: Endoscopy;  Laterality: N/A;   CYSTOGRAM  03/08   ESOPHAGOGASTRODUODENOSCOPY (EGD) WITH PROPOFOL N/A 06/20/2017   Procedure: ESOPHAGOGASTRODUODENOSCOPY (EGD) WITH PROPOFOL;  Surgeon: Lucilla Lame, MD;  Location: [facility] ENDOSCOPY;  Service: Endoscopy;  Laterality: N/A;   ETHMOIDECTOMY Right 03/01/2020   Procedure: ETHMOIDECTOMY;  Surgeon: Clyde Canterbury, MD;  Location: [facility] ORS;  Service: ENT;  Laterality: Right;   EYE SURGERY Bilateral    cataract   IMAGE GUIDED SINUS SURGERY N/A 03/01/2020   Procedure: IMAGE GUIDED SINUS SURGERY;  Surgeon: Clyde Canterbury, MD;  Location: [facility] ORS;  Service: ENT;  Laterality: N/A;   MAXILLARY ANTROSTOMY Bilateral 03/01/2020   Procedure: MAXILLARY ANTROSTOMY WITH TISSUE;  Surgeon: Clyde Canterbury, MD;  Location: [facility]  ORS;  Service: ENT;  Laterality: Bilateral;   SEPTOPLASTY N/A 03/01/2020   Procedure: SEPTOPLASTY;  Surgeon: Clyde Canterbury, MD;  Location: [facility] ORS;  Service: ENT;  Laterality: N/A;   TURBINATE REDUCTION Bilateral 03/01/2020   Procedure: TURBINATE REDUCTION;  Surgeon: Clyde Canterbury, MD;  Location: [facility] ORS;  Service: ENT;  Laterality: Bilateral;    Reviewed. Otherwise negative.   MEDICATIONS:  Prior to Admission medications   Medication Sig Start Date End Date Taking? Authorizing Provider  acetaminophen (TYLENOL) 500 MG tablet Take 500-1,000 mg by  mouth every 6 (six) hours as needed for mild pain, moderate pain or fever.    Yes [provider]  amLODipine (NORVASC) 10 MG tablet Take 0.5 tablets (5 mg total) by mouth daily. 04/04/19  Yes Wieting, Richard, MD  atorvastatin (LIPITOR) 10 MG tablet Take 10 mg by mouth every evening.    Yes [provider]  carvedilol (COREG) 6.25 MG tablet Take 6.25 mg by mouth 2 (two) times daily. 09/28/19  Yes [provider]  Cholecalciferol 25 MCG (1000 UT) tablet Take 1,000 Units by mouth daily.    Yes [provider]  doxazosin (CARDURA) 8 MG tablet Take 8 mg by mouth at bedtime. 11/09/19  Yes [provider]  hydrALAZINE (APRESOLINE) 100 MG tablet Take 100 mg by mouth daily.  09/01/19  Yes [provider]  losartan (COZAAR) 100 MG tablet Take 100 mg by mouth daily. 11/09/19  Yes [provider]  potassium chloride SA (KLOR-CON) 20 MEQ tablet Take 20 mEq by mouth daily. 09/13/19  Yes [provider]  nystatin (MYCOSTATIN/NYSTOP) powder Apply 1 application topically 3 (three) times daily. 12/13/20   Zara Council A, PA-C     ALLERGIES:  Allergies  Allergen Reactions   Aspirin Other (See Comments)    Other reaction(s): Other (See Comments) He gets GI bleed.     Nsaids Other (See Comments)    Gi bleed   Ace Inhibitors Other (See Comments)    unknown   Lisinopril Other (See Comments)    unknown   Hydrochlorothiazide Other (See Comments)    Gout   Ramipril Other (See Comments)    unknown     SOCIAL HISTORY:  Social History   Socioeconomic History   Marital status: Married    Spouse name: Not on file   Number of children: 2   Years of education: Not on file   Highest education level: Not on file  Occupational History   Occupation: retired  Tobacco Use   Smoking status: Former    Packs/day: 1.00    Years: 30.00    Pack years: 30.00    Types: Cigarettes    Quit date: 09/02/1978    Years since quitting: 42.6   Smokeless  tobacco: Never  Vaping Use   Vaping Use: Never used  Substance and Sexual Activity   Alcohol use: No   Drug use: No   Sexual activity: Never  Other Topics Concern   Not on file  Social History Narrative   Enjoys violin   Social Determinants of Health   Financial Resource Strain: Not on file  Food Insecurity: Not on file  Transportation Needs: Not on file  Physical Activity: Not on file  Stress: Not on file  Social Connections: Not on file  Intimate Partner Violence: Not on file     FAMILY HISTORY:  Family History  Problem Relation Age of Onset   Fibromyalgia Mother    Hypertension Father    Diabetes  Neg Hx     Otherwise negative.   REVIEW OF SYSTEMS:  Review of Systems  Constitutional:  Negative for chills and fever.  HENT:  Negative for congestion and sore throat.   Respiratory:  Negative for cough and shortness of breath.   Cardiovascular:  Negative for chest pain and palpitations.  Gastrointestinal:  Positive for abdominal pain and nausea. Negative for constipation, diarrhea and vomiting.  Genitourinary:  Negative for dysuria and urgency.  All other systems reviewed and are negative.  VITAL SIGNS:  Temp:  [98.1 F (36.7 C)] 98.1 F (36.7 C) (08/30 1313) Pulse Rate:  [98] 98 (08/30 1313) Resp:  [26] 26 (08/30 1313) BP: (104)/(73) 104/73 (08/30 1313) SpO2:  [98 %] 98 % (08/30 1313) Weight:  [86.6 kg] 86.6 kg (08/30 1314)     Height: '5\' 7"'$  (170.2 cm) Weight: 86.6 kg BMI (Calculated): 29.91   PHYSICAL EXAM:  Physical Exam Vitals and nursing note reviewed. Exam conducted with a chaperone present.  Constitutional:      General: He is not in acute distress.    Appearance: He is well-developed. He is ill-appearing and diaphoretic.     Comments: Patient appears remarkably uncomfortable, shaking in bed  HENT:     Head: Normocephalic and atraumatic.  Eyes:     General: No scleral icterus.    Extraocular Movements: Extraocular movements intact.  Cardiovascular:      Rate and Rhythm: Regular rhythm. Tachycardia present.     Heart sounds: Normal heart sounds. No murmur heard. Pulmonary:     Effort: Pulmonary effort is normal. No respiratory distress.     Breath sounds: Normal breath sounds.  Abdominal:     General: There is no distension.     Palpations: Abdomen is soft.     Tenderness: There is abdominal tenderness in the right upper quadrant, right lower quadrant and periumbilical area. There is guarding. There is no rebound.     Comments: Abdomen is soft, he is remarkably tender throughout his right abdomen with guarding, certainly with evidence of peritonitis   Genitourinary:    Comments: Deferred Skin:    General: Skin is warm.     Coloration: Skin is not jaundiced or pale.  Neurological:     General: No focal deficit present.     Mental Status: He is alert and oriented to person, place, and time.  Psychiatric:        Mood and Affect: Mood normal.        Behavior: Behavior normal.    INTAKE/OUTPUT:  This shift: No intake/output data recorded.  Last 2 shifts: '@IOLAST2SHIFTS'$ @  Labs:  CBC Latest Ref Rng & Units 05/01/2021 08/03/2019 05/25/2019  WBC 4.0 - 10.5 K/uL 6.9 7.0 5.9  Hemoglobin 13.0 - 17.0 g/dL 13.5 12.6(L) 11.1(L)  Hematocrit 39.0 - 52.0 % 40.3 36.7(L) 33.0(L)  Platelets 150 - 400 K/uL 292 219 238   CMP Latest Ref Rng & Units 05/01/2021 02/28/2020 08/03/2019  Glucose 70 - 99 mg/dL 162(H) 95 123(H)  BUN 8 - 23 mg/dL 34(H) 27(H) 25(H)  Creatinine 0.61 - 1.24 mg/dL 1.90(H) 1.24 1.25(H)  Sodium 135 - 145 mmol/L 140 142 141  Potassium 3.5 - 5.1 mmol/L 4.2 3.8 3.3(L)  Chloride 98 - 111 mmol/L 107 105 109  CO2 22 - 32 mmol/L 21(L) 27 22  Calcium 8.9 - 10.3 mg/dL 9.3 9.4 9.4  Total Protein 6.5 - 8.1 g/dL 7.1 - 7.3  Total Bilirubin 0.3 - 1.2 mg/dL 0.7 - 0.7  Alkaline Phos  38 - 126 U/L 54 - 46  AST 15 - 41 U/L 21 - 17  ALT 0 - 44 U/L 10 - 12   Imaging studies:   CT Abdomen/Pelvis (05/01/2021) personally reviewed with  pneumoperitoneum and questionable fluid near gallbladder/duodenum, suspect possible perforated ulcer, there is colonic diverticulosis but no evidence of diverticulitis, and radiologist report reviewed:  IMPRESSION: Inflammatory changes surrounding the gallbladder with surrounding air and small volume pneumoperitoneum is concerning for ruptured, acute cholecystitis.   Assessment/Plan: (ICD-10's: K80.8) 75 y.o. male with severe abdominal pain and peritonitis on examination found to have pneumoperitoneum without clear source, concerning for perforated ulcer vs severe cholecystitis    - Will admit to general surgery - Will proceed emergently with diagnostic laparoscopic with low threshold to proceed with laparotomy with Dr Dahlia Byes pending OR/Anesthesia availability   - All risks, benefits, and alternatives to above procedure(s) were discussed with the patient and his wife, all of their questions were answered to their expressed satisfaction, patient expresses he wishes to proceed, and informed consent was obtained.   - Strict NPO + IVF Resuscitation  - IV Abx (Zosyn); received ceftriaxone and flagyl in ED - Monitor abdominal examination - Pain control prn; antiemetics prn - Morning labs   - DVT prophylaxis; hold for OR  All of the above findings and recommendations were discussed with the patient and his wife, and all of their questions were answered to their expressed satisfaction.  -- [place] Simon, PA-C [facility] 05/01/2021, 3:00 PM [phone] M-F: 7am - 4pm

## 2021-05-01 NOTE — ED Triage Notes (Signed)
Pt to ED for RLQ pain that started this am. +vomiting  Afebrile Pt rocking back in forth in chair, grunting, guarding area, unable to sit still Reports hx of kidney stones General abd tender upon palpitation

## 2021-05-01 NOTE — ED Notes (Signed)
While triaging pt, pt reports radiation of pain to right upper abd and shoulder EKG signed Dr Cinda Quest

## 2021-05-01 NOTE — ED Provider Notes (Signed)
[facility] Emergency Department Provider Note    None    (approximate)  I have reviewed the triage vital signs and the nursing notes.   HISTORY  Chief Complaint Abdominal Pain    HPI Carl [name] is a 75 y.o. male with the below listed past medical history presents to the ER for evaluation of sudden onset severe epigastric and right-sided abdominal pain that started around 11 AM.  N.p.o. since 8.  No fevers.  Has had nausea.  Pain does not radiate through to his back.  States he has a history of ulcers but no history of surgery.  Denies any flank pain.  No dysuria.  He is not any blood thinners.   Past Medical History:  Diagnosis Date   Adenoma of colon    Alcoholism ([place])    still drinking wine as of Feb 2013   Anxiety and depression    Cataract    Cervical spondylosis without myelopathy    Cervicalgia of occipito-atlanto-axial region    Chronic pain disorder    Degeneration of cervical intervertebral disc    Degeneration, intervertebral disc, cervical    Duodenal ulcer 2011   Facet arthropathy, cervical    Fuchs' corneal dystrophy    Gastric ulcer 2011   egd at [facility] Aug 2011.   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    LVH (left ventricular hypertrophy)    Mild cognitive impairment    Mitral insufficiency    Neck pain    Neuralgia and neuritis    Nocturia    Occipital neuralgia    Osteoarthritis    neck   Osteoarthritis    Pure hypercholesterolemia    Radiculitis    Stroke (HCC)    aphasia, slight right sided weakness   Tricuspid insufficiency    Family History  Problem Relation Age of Onset   Fibromyalgia Mother    Hypertension Father    Diabetes Neg Hx    Past Surgical History:  Procedure Laterality Date   COLONOSCOPY WITH PROPOFOL N/A 06/05/2018   Procedure: COLONOSCOPY WITH  PROPOFOL;  Surgeon: Manya Silvas, MD;  Location: [facility] ENDOSCOPY;  Service: Endoscopy;  Laterality: N/A;   CYSTOGRAM  03/08   ESOPHAGOGASTRODUODENOSCOPY (EGD) WITH PROPOFOL N/A 06/20/2017   Procedure: ESOPHAGOGASTRODUODENOSCOPY (EGD) WITH PROPOFOL;  Surgeon: Lucilla Lame, MD;  Location: [facility] ENDOSCOPY;  Service: Endoscopy;  Laterality: N/A;   ETHMOIDECTOMY Right 03/01/2020   Procedure: ETHMOIDECTOMY;  Surgeon: Clyde Canterbury, MD;  Location: [facility] ORS;  Service: ENT;  Laterality: Right;   EYE SURGERY Bilateral    cataract   IMAGE GUIDED SINUS SURGERY N/A 03/01/2020   Procedure: IMAGE GUIDED SINUS SURGERY;  Surgeon: Clyde Canterbury, MD;  Location: [facility] ORS;  Service: ENT;  Laterality: N/A;   MAXILLARY ANTROSTOMY Bilateral 03/01/2020   Procedure: MAXILLARY ANTROSTOMY WITH TISSUE;  Surgeon: Clyde Canterbury, MD;  Location: [facility] ORS;  Service: ENT;  Laterality: Bilateral;   SEPTOPLASTY N/A 03/01/2020   Procedure: SEPTOPLASTY;  Surgeon: Clyde Canterbury, MD;  Location: [facility] ORS;  Service: ENT;  Laterality: N/A;   TURBINATE REDUCTION Bilateral 03/01/2020   Procedure: TURBINATE REDUCTION;  Surgeon: Clyde Canterbury, MD;  Location: [facility] ORS;  Service: ENT;  Laterality: Bilateral;   Patient Active Problem List   Diagnosis Date Noted   Acute renal failure (ARF) ([place])  04/02/2019   Neurogenic pain 12/21/2018   Cervical spondylosis with radiculopathy 10/13/2018   Melena 09/15/2018   Acute esophagogastric ulcer 09/15/2018   Duodenum ulcer 09/15/2018   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   Chronic neck pain (Primary Area of Pain) (Left) 07/09/2018   Chronic pain syndrome 07/09/2018   Pharmacologic therapy 07/09/2018   Disorder of skeletal system 07/09/2018   Problems influencing health status 07/09/2018   Chronic facial pain (Secondary Area of Pain) (Left) 07/09/2018   Cervicalgia (Left) 07/09/2018   Abnormal behavior 04/01/2018   Socially inappropriate behavior 04/01/2018   History of adenomatous polyp of colon  03/20/2018   History of upper gastrointestinal bleeding 03/20/2018   GIB (gastrointestinal bleeding) 06/19/2017   Occipital neuralgia (Left) 03/04/2017   GI bleed 03/03/2017   History of stroke 03/03/2017   Right sided weakness 03/03/2017   Slurred speech 03/03/2017   Word finding difficulty 03/03/2017   Opiate use 03/03/2017   Chronic headaches 03/03/2017   Chronic knee pain (Bilateral) 03/03/2017   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   Adjustment disorder with mixed disturbance of emotions and conduct 12/06/2016   Cervical spondylosis without myelopathy 05/16/2015   Cervicalgia of occipito-atlanto-axial region (Left) 02/09/2015   Intractable 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 10/21/2013   Chronic pain disorder 09/09/2013   Neuralgia, neuritis, and radiculitis, unspecified 09/09/2013   Cognitive disorder 06/23/2013   Depression, major, single episode, moderate ([place]) 06/23/2013   Mild cognitive impairment 06/23/2013   Anxiety and depression 05/05/2013   Cervical facet arthropathy (Left) 05/05/2013   Spondylosis without myelopathy or radiculopathy, cervical region 05/05/2013   Nocturia 04/22/2013   LVH (left ventricular hypertrophy) 02/19/2013   Mitral insufficiency 02/19/2013   Pure hypercholesterolemia 02/19/2013   Tricuspid insufficiency 02/19/2013   Cataracts, bilateral 08/21/2012   Fuchs' corneal dystrophy 08/21/2012   Alcohol dependence in remission ([place]) 03/14/2011   DDD (degenerative disc disease), cervical 03/14/2011   Persistent insomnia 03/14/2011   Osteoarthritis involving multiple joints 03/14/2011   Primary generalized (osteo)arthritis 03/14/2011   ANXIETY, SITUATIONAL 06/15/2010   Situational anxiety 06/15/2010   CELLULITIS, HAND, LEFT 05/03/2010   Cellulitis of unspecified part of limb 05/03/2010   Unspecified contact dermatitis due to plants, except food 01/12/2010   Sprain of joints and ligaments of unspecified parts of neck, initial  encounter 01/03/2010   LUNG NODULE 07/17/2009   Chest pain 07/13/2009   UNSPECIFIED PROSTATITIS 06/15/2009   Alcohol abuse 05/15/2009   Alcohol withdrawal ([place]) 04/24/2009   Impaired fasting glucose 11/04/2007   Gout 01/02/2007   Essential hypertension 01/02/2007      Prior to Admission medications   Medication Sig Start Date End Date Taking? Authorizing Provider  acetaminophen (TYLENOL) 500 MG tablet Take 500-1,000 mg by mouth every 6 (six) hours as needed for mild pain, moderate pain or fever.    Yes [provider]  amLODipine (NORVASC) 10 MG tablet Take 0.5 tablets (5 mg total) by mouth daily. 04/04/19  Yes Wieting, Richard, MD  atorvastatin (LIPITOR) 10 MG tablet Take 10 mg by mouth every evening.    Yes [provider]  carvedilol (COREG) 6.25 MG tablet Take 6.25 mg by mouth 2 (two) times daily. 09/28/19  Yes [provider]  Cholecalciferol 25 MCG (1000 UT) tablet Take 1,000 Units by mouth daily.    Yes [provider]  doxazosin (CARDURA) 8 MG tablet Take 8 mg by mouth at bedtime. 11/09/19  Yes [provider]  hydrALAZINE (APRESOLINE) 100 MG tablet Take 100 mg by  mouth daily.  09/01/19  Yes [provider]  losartan (COZAAR) 100 MG tablet Take 100 mg by mouth daily. 11/09/19  Yes [provider]  potassium chloride SA (KLOR-CON) 20 MEQ tablet Take 20 mEq by mouth daily. 09/13/19  Yes [provider]  nystatin (MYCOSTATIN/NYSTOP) powder Apply 1 application topically 3 (three) times daily. 12/13/20   Zara Council A, PA-C    Allergies Aspirin, Nsaids, Ace inhibitors, Lisinopril, Hydrochlorothiazide, and Ramipril    Social History Social History   Tobacco Use   Smoking status: Former    Packs/day: 1.00    Years: 30.00    Pack years: 30.00    Types: Cigarettes    Quit date: 09/02/1978    Years since quitting: 42.6   Smokeless tobacco: Never  Vaping Use   Vaping Use: Never used  Substance Use Topics    Alcohol use: No   Drug use: No    Review of Systems Patient denies headaches, rhinorrhea, blurry vision, numbness, shortness of breath, chest pain, edema, cough, abdominal pain, nausea, vomiting, diarrhea, dysuria, fevers, rashes or hallucinations unless otherwise stated above in HPI. ____________________________________________   PHYSICAL EXAM:  VITAL SIGNS: Vitals:   05/01/21 1313  BP: 104/73  Pulse: 98  Resp: (!) 26  Temp: 98.1 F (36.7 C)  SpO2: 98%    Constitutional: Alert and oriented.  Eyes: Conjunctivae are normal.  Head: Atraumatic. Nose: No congestion/rhinnorhea. Mouth/Throat: Mucous membranes are moist.   Neck: No stridor. Painless ROM.  Cardiovascular: Normal rate, regular rhythm. Grossly normal heart sounds.  Good peripheral circulation. Respiratory: Normal respiratory effort.  No retractions. Lungs CTAB. Gastrointestinal: guarding in ruq and epigastric region with diffuse ttp.  No distention. No abdominal bruits. No CVA tenderness. Genitourinary:  Musculoskeletal: No lower extremity tenderness nor edema.  No joint effusions. Neurologic:  Normal speech and language. No gross focal neurologic deficits are appreciated. No facial droop Skin:  Skin is warm, dry and intact. No rash noted. Psychiatric: Mood and affect are normal. Speech and behavior are normal.  ____________________________________________   LABS (all labs ordered are listed, but only abnormal results are displayed)  Results for orders placed or performed during the hospital encounter of 05/01/21 (from the past 24 hour(s))  Lipase, blood     Status: Abnormal   Collection Time: 05/01/21  1:28 PM  Result Value Ref Range   Lipase 117 (H) 11 - 51 U/L  Comprehensive metabolic panel     Status: Abnormal   Collection Time: 05/01/21  1:28 PM  Result Value Ref Range   Sodium 140 135 - 145 mmol/L   Potassium 4.2 3.5 - 5.1 mmol/L   Chloride 107 98 - 111 mmol/L   CO2 21 (L) 22 - 32 mmol/L   Glucose,  Bld 162 (H) 70 - 99 mg/dL   BUN 34 (H) 8 - 23 mg/dL   Creatinine, Ser 1.90 (H) 0.61 - 1.24 mg/dL   Calcium 9.3 8.9 - 10.3 mg/dL   Total Protein 7.1 6.5 - 8.1 g/dL   Albumin 3.7 3.5 - 5.0 g/dL   AST 21 15 - 41 U/L   ALT 10 0 - 44 U/L   Alkaline Phosphatase 54 38 - 126 U/L   Total Bilirubin 0.7 0.3 - 1.2 mg/dL   GFR, Estimated 37 (L) >60 mL/min   Anion gap 12 5 - 15  CBC     Status: None   Collection Time: 05/01/21  1:28 PM  Result Value Ref Range   WBC 6.9 4.0 -  10.5 K/uL   RBC 4.65 4.22 - 5.81 MIL/uL   Hemoglobin 13.5 13.0 - 17.0 g/dL   HCT 40.3 39.0 - 52.0 %   MCV 86.7 80.0 - 100.0 fL   MCH 29.0 26.0 - 34.0 pg   MCHC 33.5 30.0 - 36.0 g/dL   RDW 12.7 11.5 - 15.5 %   Platelets 292 150 - 400 K/uL   nRBC 0.0 0.0 - 0.2 %  Troponin I (High Sensitivity)     Status: None   Collection Time: 05/01/21  1:28 PM  Result Value Ref Range   Troponin I (High Sensitivity) 16 <18 ng/L   ____________________________________________  EKG My review and personal interpretation at Time: 13;18   Indication: abd pain  Rate: 90  Rhythm: sinus Axis: normal Other: rbbb, no stemi ____________________________________________  RADIOLOGY  I personally reviewed all radiographic images ordered to evaluate for the above acute complaints and reviewed radiology reports and findings.  These findings were personally discussed with the patient.  Please see medical record for radiology report.  ____________________________________________   PROCEDURES  Procedure(s) performed:  Procedures    Critical Care performed: yes ____________________________________________   INITIAL IMPRESSION / ASSESSMENT AND PLAN / ED COURSE  Pertinent labs & imaging results that were available during my care of the patient were reviewed by me and considered in my medical decision making (see chart for details).   DDX: Diverticulitis, perforated viscus, perforated ulcer, biliary pathology, cholelithiasis, cholecystitis,  pancreatitis  OSWIN STOOP is a 75 y.o. who presents to the ED with presentation as described above.  Patient is ill-appearing quite tender on exam showing guarding and peritonitic on exam.  CT imaging ordered emergently as chest x-ray in triage showed evidence of free air.  The patient will be placed on continuous pulse oximetry and telemetry for monitoring.  Laboratory evaluation will be sent to evaluate for the above complaints.     Clinical Course as of 05/01/21 1500  Tue May 01, 2021  1439 I consulted surgery after reviewing CT imaging with findings concerning for perforated viscus.  Has a history of perforated ulcer which I suspect is what is causing today's presentation.  Have ordered IV antibiotics IV fluids as well as IV pain medication. [PR]  [id number] Surgery as evaluated patient at bedside and will take to or for operative management.  [PR]    Clinical Course User Index [PR] Merlyn Lot, MD    The patient was evaluated in Emergency Department today for the symptoms described in the history of present illness. He/she was evaluated in the context of the global COVID-19 pandemic, which necessitated consideration that the patient might be at risk for infection with the SARS-CoV-2 virus that causes COVID-19. Institutional protocols and algorithms that pertain to the evaluation of patients at risk for COVID-19 are in a state of rapid change based on information released by regulatory bodies including the CDC and federal and state organizations. These policies and algorithms were followed during the patient's care in the ED.  As part of my medical decision making, I reviewed the following data within the [facility] notes reviewed and incorporated, Labs reviewed, notes from prior ED visits and [place] Controlled Substance Database   ____________________________________________   FINAL CLINICAL IMPRESSION(S) / ED DIAGNOSES  Final diagnoses:  Perforated viscus       NEW MEDICATIONS STARTED DURING THIS VISIT:  New Prescriptions   No medications on file     Note:  This document was prepared using Dragon  voice recognition software and may include unintentional dictation errors.    Merlyn Lot, MD 05/01/21 1500

## 2021-05-01 NOTE — Anesthesia Procedure Notes (Signed)
Procedure Name: Intubation Date/Time: 05/01/2021 3:46 PM Performed by: Tollie Eth, CRNA Pre-anesthesia Checklist: Patient identified, Patient being monitored, Timeout performed, Emergency Drugs available and Suction available Patient Re-evaluated:Patient Re-evaluated prior to induction Oxygen Delivery Method: Circle system utilized Preoxygenation: Pre-oxygenation with 100% oxygen Induction Type: IV induction, Rapid sequence and Cricoid Pressure applied Laryngoscope Size: McGraph and 4 Grade View: Grade I Tube type: Oral Tube size: 7.5 mm Number of attempts: 1 Airway Equipment and Method: Stylet Placement Confirmation: ETT inserted through vocal cords under direct vision, positive ETCO2 and breath sounds checked- equal and bilateral Secured at: 22 cm Tube secured with: Tape Dental Injury: Teeth and Oropharynx as per pre-operative assessment

## 2021-05-02 ENCOUNTER — Inpatient Hospital Stay: Payer: Self-pay

## 2021-05-02 ENCOUNTER — Encounter: Payer: Self-pay | Admitting: Surgery

## 2021-05-02 ENCOUNTER — Inpatient Hospital Stay: Payer: Medicare Other

## 2021-05-02 LAB — COMPREHENSIVE METABOLIC PANEL
ALT: 97 U/L — ABNORMAL HIGH (ref 0–44)
AST: 90 U/L — ABNORMAL HIGH (ref 15–41)
Albumin: 3.2 g/dL — ABNORMAL LOW (ref 3.5–5.0)
Alkaline Phosphatase: 36 U/L — ABNORMAL LOW (ref 38–126)
Anion gap: 8 (ref 5–15)
BUN: 34 mg/dL — ABNORMAL HIGH (ref 8–23)
CO2: 22 mmol/L (ref 22–32)
Calcium: 8.4 mg/dL — ABNORMAL LOW (ref 8.9–10.3)
Chloride: 112 mmol/L — ABNORMAL HIGH (ref 98–111)
Creatinine, Ser: 1.39 mg/dL — ABNORMAL HIGH (ref 0.61–1.24)
GFR, Estimated: 53 mL/min — ABNORMAL LOW (ref >60)
Glucose, Bld: 145 mg/dL — ABNORMAL HIGH (ref 70–99)
Potassium: 4.1 mmol/L (ref 3.5–5.1)
Sodium: 142 mmol/L (ref 135–145)
Total Bilirubin: 0.4 mg/dL (ref 0.3–1.2)
Total Protein: 6.2 g/dL — ABNORMAL LOW (ref 6.5–8.1)

## 2021-05-02 LAB — GLUCOSE, CAPILLARY
Glucose-Capillary: 112 mg/dL — ABNORMAL HIGH (ref 70–99)
Glucose-Capillary: 118 mg/dL — ABNORMAL HIGH (ref 70–99)

## 2021-05-02 LAB — CBC
HCT: 37 % — ABNORMAL LOW (ref 39.0–52.0)
Hemoglobin: 12.3 g/dL — ABNORMAL LOW (ref 13.0–17.0)
MCH: 29.3 pg (ref 26.0–34.0)
MCHC: 33.2 g/dL (ref 30.0–36.0)
MCV: 88.1 fL (ref 80.0–100.0)
Platelets: 217 10*3/uL (ref 150–400)
RBC: 4.2 MIL/uL — ABNORMAL LOW (ref 4.22–5.81)
RDW: 13 % (ref 11.5–15.5)
WBC: 9.3 10*3/uL (ref 4.0–10.5)
nRBC: 0 % (ref 0.0–0.2)

## 2021-05-02 LAB — MAGNESIUM: Magnesium: 1.7 mg/dL (ref 1.7–2.4)

## 2021-05-02 LAB — HEMOGLOBIN A1C
Hgb A1c MFr Bld: 5.2 % (ref 4.8–5.6)
Mean Plasma Glucose: 103 mg/dL

## 2021-05-02 LAB — PHOSPHORUS: Phosphorus: 3.7 mg/dL (ref 2.5–4.6)

## 2021-05-02 MED ORDER — INSULIN ASPART 100 UNIT/ML IJ SOLN: 0.0000 [IU] | Freq: Three times a day (TID) | INTRAMUSCULAR | Status: DC

## 2021-05-02 MED ORDER — TRAVASOL 10 % IV SOLN
INTRAVENOUS | Status: AC
  Filled 2021-05-02: qty 375.8

## 2021-05-02 MED ORDER — ACETAMINOPHEN 10 MG/ML IV SOLN
1000.0000 mg | Freq: Four times a day (QID) | INTRAVENOUS | Status: AC
  Administered 2021-05-02 – 2021-05-03 (×4): 1000 mg via INTRAVENOUS
  Filled 2021-05-02 (×6): qty 100

## 2021-05-02 MED ORDER — SODIUM CHLORIDE 0.9 % IV SOLN: INTRAVENOUS | Status: DC

## 2021-05-02 MED ORDER — SODIUM CHLORIDE 0.9% FLUSH
10.0000 mL | INTRAVENOUS | Status: DC | PRN
  Administered 2021-05-03: 10 mL

## 2021-05-02 MED ORDER — ALBUMIN HUMAN 25 % IV SOLN
25.0000 g | Freq: Once | INTRAVENOUS | Status: AC
  Administered 2021-05-02: 25 g via INTRAVENOUS
  Filled 2021-05-02: qty 100

## 2021-05-02 MED ORDER — SODIUM CHLORIDE 0.9% FLUSH
10.0000 mL | Freq: Two times a day (BID) | INTRAVENOUS | Status: DC
  Administered 2021-05-02: 21:00:00 10 mL
  Administered 2021-05-02: 20 mL
  Administered 2021-05-03: 10 mL
  Administered 2021-05-03: 20:00:00 30 mL
  Administered 2021-05-04: 20 mL
  Administered 2021-05-05: 09:00:00 10 mL
  Administered 2021-05-05: 20 mL
  Administered 2021-05-06: 10 mL
  Administered 2021-05-07: 30 mL
  Administered 2021-05-08 – 2021-05-10 (×5): 10 mL

## 2021-05-02 NOTE — Progress Notes (Signed)
[facility] Hospital Day(s): 1.   Post op day(s): 1 Day Post-Op.   Interval History:  Patient seen and examined No acute events or new complaints overnight.  Patient reports he is having incisional soreness but his abdominal pain is overall improved compared to presentation No fever, chills Labs are pending this morning No new imaging Surgical drain with 95 ccs out; serosanguinous NGT with 635 ccs out He is currently on Zosyn He continues on strict NPO status   Vital signs in last 24 hours: [min-max] current  Temp:  [96.7 F (35.9 C)-98.5 F (36.9 C)] 97.8 F (36.6 C) (08/31 0221) Pulse Rate:  [73-98] 73 (08/31 0700) Resp:  [14-26] 20 (08/31 0700) BP: (95-137)/(61-86) 137/82 (08/31 0700) SpO2:  [88 %-99 %] 98 % (08/31 0700) Weight:  [86.6 kg-92.7 kg] 92.7 kg (08/30 2200)     Height: '5\' 7"'$  (170.2 cm) Weight: 92.7 kg BMI (Calculated): 32   Intake/Output last 2 shifts:  08/30 0701 - 08/31 0700 In: 3196.3 [I.V.:2646.2; IV Piggyback:550.1] Out: 750 [Urine:635; Drains:95; Blood:20]   Physical Exam:  Constitutional: alert, cooperative and no distress  HEENT: NGT in place Respiratory: breathing non-labored at rest, on [place] Cardiovascular: regular rate and sinus rhythm  Gastrointestinal: Soft, incisional soreness, non-distended, no rebound/guarding. Surgical drain in LLQ with serosanguinous output Integumentary: Laparoscopic incisions are CDI with gauze and tegaderm  Labs:  CBC Latest Ref Rng & Units 05/01/2021 08/03/2019 05/25/2019  WBC 4.0 - 10.5 K/uL 6.9 7.0 5.9  Hemoglobin 13.0 - 17.0 g/dL 13.5 12.6(L) 11.1(L)  Hematocrit 39.0 - 52.0 % 40.3 36.7(L) 33.0(L)  Platelets 150 - 400 K/uL 292 219 238   CMP Latest Ref Rng & Units 05/01/2021 02/28/2020 08/03/2019  Glucose 70 - 99 mg/dL 162(H) 95 123(H)  BUN 8 - 23 mg/dL 34(H) 27(H) 25(H)  Creatinine 0.61 - 1.24 mg/dL 1.90(H) 1.24 1.25(H)  Sodium 135 - 145 mmol/L 140 142 141  Potassium 3.5 -  5.1 mmol/L 4.2 3.8 3.3(L)  Chloride 98 - 111 mmol/L 107 105 109  CO2 22 - 32 mmol/L 21(L) 27 22  Calcium 8.9 - 10.3 mg/dL 9.3 9.4 9.4  Total Protein 6.5 - 8.1 g/dL 7.1 - 7.3  Total Bilirubin 0.3 - 1.2 mg/dL 0.7 - 0.7  Alkaline Phos 38 - 126 U/L 54 - 46  AST 15 - 41 U/L 21 - 17  ALT 0 - 44 U/L 10 - 12    Imaging studies: No new pertinent imaging studies   Assessment/Plan:  75 y.o. male 1 Day Post-Op s/p robotic assisted repair of perforated duodenal ulcer with omental flap.   -  He will need to be NPO x5-7 days. After this time, we will likely get UGI to reassess the repair prior to initiating any oral intake  - He will need PICC + TPN; orders placed  - Continue NGT decompression to LIS; monitor and record output  - Continue IV Abx (Zosyn) - Continue IVF resuscitation - Monitor abdominal examination - Monitor surgical drain; record output - Pain control prn; antiemetic prn   - Mobilization as tolerated; low threshold to engage PT   All of the above findings and recommendations were discussed with the patient, and the medical team, and all of patient's questions were answered to his expressed satisfaction.  -- Edison Simon, PA-C [facility] 05/02/2021, 7:17 AM [phone] M-F: 7am - 4pm

## 2021-05-02 NOTE — Progress Notes (Signed)
Initial Nutrition Assessment  DOCUMENTATION CODES:   Not applicable  INTERVENTION:   TPN per pharmacy   Recommend thiamine, folic acid and MVI daily added to TPN  Pt at high refeed risk; recommend monitor potassium, magnesium and phosphorus labs daily until stable  Daily weights   NUTRITION DIAGNOSIS:   Inadequate oral intake related to acute illness as evidenced by NPO status.  GOAL:   Patient will meet greater than or equal to 90% of their needs  MONITOR:   Labs, Weight trends, Skin, I & O's, Other (Comment) (TPN)  REASON FOR ASSESSMENT:   Consult New TPN/TNA  ASSESSMENT:   75 y/o male with h/o gout, HTN, lung nodule, anxiety, depression, etoh abuse, DDD, stroke and PUD who is admitted for perforated duodenal ulcer now s/p robotic assisted laparoscopic repair with omental flap and pexy to reinforce closure 8/30  Met with pt and pt's wife in room today. Pt reports poor appetite and oral intake for the past month. Pt reports that "I have felt better" today when asked. Pt reports some pain around his incision but denies any abdominal cramping or nausea. Pt has not had any flatus or BM yet. NGT in place to LIS with minimal output. Drains with 95ml output. Plan is for PICC line and TPN today. Pt is likely at refeed risk. TPN was discussed with pt and wife and all of their questions were answered. Pt is anticipated to be NPO for 5-7 days per MD note. Per chart, pt appears fairly weight stable at baseline. Pt is currently up ~15lbs from his UBW according to the bed scale. Pt denies any weight changes pta. Pt is willing to drink Ensure once his diet advances.   Medications reviewed and include: lovenox, protonix, NaCl @125ml/hr, albumin, zosyn   Labs reviewed: K 4.1 wnl, BUN 34(H), creat 1.39(H), P 3.7 wnl, Mg 1.7 wnl  NUTRITION - FOCUSED PHYSICAL EXAM:  Flowsheet Row Most Recent Value  Orbital Region No depletion  Upper Arm Region Mild depletion  Thoracic and Lumbar Region  No depletion  Buccal Region No depletion  Temple Region No depletion  Clavicle Bone Region Moderate depletion  Clavicle and Acromion Bone Region Moderate depletion  Scapular Bone Region Mild depletion  Dorsal Hand No depletion  Patellar Region No depletion  Anterior Thigh Region No depletion  Posterior Calf Region No depletion  Edema (RD Assessment) None  Hair Reviewed  Eyes Reviewed  Mouth Reviewed  Skin Reviewed  Nails Reviewed   Diet Order:   Diet Order             Diet NPO time specified Except for: Ice Chips  Diet effective now                  EDUCATION NEEDS:   Education needs have been addressed  Skin:  Skin Assessment: Reviewed RN Assessment (incision abdomen)  Last BM:  pta  Height:   Ht Readings from Last 1 Encounters:  05/01/21 5' 7" (1.702 m)    Weight:   Wt Readings from Last 1 Encounters:  05/02/21 93.4 kg    Ideal Body Weight:  67.27 kg  BMI:  Body mass index is 32.25 kg/m.  Estimated Nutritional Needs:   Kcal:  2000-2300kcal/day  Protein:  100-115g/day  Fluid:  1.7-2.0L/day    MS, RD, LDN Please refer to AMION for RD and/or RD on-call/weekend/after hours pager  

## 2021-05-02 NOTE — Anesthesia Postprocedure Evaluation (Signed)
Anesthesia Post Note  Patient: Carl [name]  Procedure(s) Performed: XI ROBOT ASSISTED DIAGNOSTIC LAPAROSCOPY ROBOTIC REPAIR OF PERFORATED DUODENAL ULCER  Patient location during evaluation: ICU Anesthesia Type: General Level of consciousness: awake and alert Pain management: pain level controlled Vital Signs Assessment: post-procedure vital signs reviewed and stable Respiratory status: spontaneous breathing, nonlabored ventilation, respiratory function stable and patient connected to nasal cannula oxygen Cardiovascular status: blood pressure returned to baseline and stable Postop Assessment: no apparent nausea or vomiting Anesthetic complications: no   No notable events documented.   Last Vitals:  Vitals:   05/02/21 0600 05/02/21 0700  BP: 137/83 137/82  Pulse: 80 73  Resp: 20 20  Temp:    SpO2: 94% 98%    Last Pain:  Vitals:   05/02/21 0400  TempSrc:   PainSc: 0-No pain                 Alison Stalling

## 2021-05-02 NOTE — Progress Notes (Signed)
Took over care of patient from Stella, [place] @ 0200

## 2021-05-02 NOTE — Progress Notes (Signed)
Peripherally Inserted Central Catheter Placement  The IV Nurse has discussed with the patient and/or persons authorized to consent for the patient, the purpose of this procedure and the potential benefits and risks involved with this procedure.  The benefits include less needle sticks, lab draws from the catheter, and the patient may be discharged home with the catheter. Risks include, but not limited to, infection, bleeding, blood clot (thrombus formation), and puncture of an artery; nerve damage and irregular heartbeat and possibility to perform a PICC exchange if needed/ordered by physician.  Alternatives to this procedure were also discussed.  Bard Power PICC patient education guide, fact sheet on infection prevention and patient information card has been provided to patient /or left at bedside.    PICC Placement Documentation  PICC Double Lumen 05/02/21 PICC Right Brachial 41 cm 0 cm (Active)  Indication for Insertion or Continuance of Line Administration of hyperosmolar/irritating solutions (i.e. TPN, Vancomycin, etc.) 05/02/21 1040  Exposed Catheter (cm) 0 cm 05/02/21 1040  Site Assessment Clean;Dry;Intact 05/02/21 1040  Lumen #1 Status Flushed;Saline locked;Blood return noted 05/02/21 1040  Lumen #2 Status Flushed;Saline locked;Blood return noted 05/02/21 1040  Dressing Type Transparent 05/02/21 1040  Dressing Status Clean;Dry;Intact 05/02/21 1040  Antimicrobial disc in place? Yes 05/02/21 1040  Dressing Intervention New dressing;Other (Comment) 05/02/21 1040  Dressing Change Due 05/09/21 05/02/21 1040   Consent signed by wife at bedside    Synthia Innocent 05/02/2021, 10:41 AM

## 2021-05-02 NOTE — Consult Note (Signed)
PHARMACY - TOTAL PARENTERAL NUTRITION CONSULT NOTE   Indication:  Perforated duodenal ulcer, which will req NPO 5-7 days  Patient Measurements: Height: '5\' 7"'$  (170.2 cm) Weight: 92.7 kg (204 lb 5.9 oz) IBW/kg (Calculated) : 66.1 TPN AdjBW (KG): 72.7 Body mass index is 32.01 kg/m. Usual Weight: 86.6 kg on intake (87.1kg 01/2021)  Assessment:   Glucose / Insulin: BG 140-160s (at goal; CTM w/ start of TPN) Electrolytes: Lytes WNL today. Renal: Scr 1.9>1.39 Hepatic: ALT 10>97; AST 21>90; lipase 117 Intake / Output; MIVF: Net positive 2366m. NS '@125ml'$ /hr (3L/d); TPN to start at 1/3 of rate 261mhr (~65049m) GI Imaging:  8/30: Abdominal XR: 1. Large stool burden without evidence of bowel obstruction. 2. No evidence of free air.  8/30: CT abd - duodenum with crescentic fluid and air collection; Given history of peptic ulcer disease, these findings in conjunction with small volume pneumoperitoneum are likely related to a perforated duodenal ulcer. GI Surgeries / Procedures:  8/30: ex-lap for perforation  Central access: 8/31 TPN start date: 8/31  Nutritional Goals: Goal TPN rate is 82 mL/hr (provides 114.14 g of protein and 2050.24 kcals per day)  RD Assessment: Estimated Needs Total Energy Estimated Needs: 2000-2300kcal/day Total Protein Estimated Needs: 100-115g/day Total Fluid Estimated Needs: 1.7-2.0L/day  Current Nutrition:  NPO  Plan:  Start TPN at 27 mL/hr at 1800 (increase rate by 1/3's daily to goal of 82 ml/hr).  Initially reduced dex 15>10% to allow SWFI QS to not overfill. Increase to 15% as concentration in bag allows. Electrolytes in TPN: Na 47m76m, K 47mE54m Ca 5mEq/70mMg 5mEq/L30mnd Phos 15mmol/60ml:Ac 1:1 Add standard MVI and trace elements to TPN Add thiamine d1/3d [id number] acid Initiate Sensitive q8h SSI. Adjust as needed (currently at goal '180mg'$ /dL) Reduce MIVF to 125>98 mL/hr at 1800 to account for addition of TPN. Monitor TPN labs on Mon/Thurs,  including daily for first couple days until goal rate established.  Lezli Danek Shanon Brow/31/2022,10:49 AM

## 2021-05-02 NOTE — Progress Notes (Addendum)
Patient transferred to unit without difficulty. Lowered HFNC to 8L [place]. NG tube connected to low int suction. Emptied 30cc of serosanguineous fluid from JP drain to left side abdomen. 5 op sites are intact to abdomen. Umbilical site has some drainage on dressing. PICC line to right upper arm. Patient has no complaints of pain or distress at this time. SCDs are operating. Fluids are infusing at 125cc/hr. Foley catheter draining tea colored urine, approx 600cc in bag.Bed in low position and call bell in reach.

## 2021-05-02 NOTE — Progress Notes (Signed)
Pt transferred to 104 via bed, oxygen, and monitor.  RN assisted with transfer.  Report called to [facility] and all questions answered and updates given.  POC  continued.  All belongings sent with patient.

## 2021-05-03 ENCOUNTER — Inpatient Hospital Stay: Payer: Medicare Other

## 2021-05-03 ENCOUNTER — Encounter: Payer: Self-pay | Admitting: Surgery

## 2021-05-03 DIAGNOSIS — R000 Tachycardia, unspecified: Secondary | ICD-10-CM

## 2021-05-03 DIAGNOSIS — Z01810 Encounter for preprocedural cardiovascular examination: Secondary | ICD-10-CM

## 2021-05-03 DIAGNOSIS — R9431 Abnormal electrocardiogram [ECG] [EKG]: Secondary | ICD-10-CM

## 2021-05-03 LAB — COMPREHENSIVE METABOLIC PANEL
ALT: 63 U/L — ABNORMAL HIGH (ref 0–44)
AST: 41 U/L (ref 15–41)
Albumin: 3.1 g/dL — ABNORMAL LOW (ref 3.5–5.0)
Alkaline Phosphatase: 31 U/L — ABNORMAL LOW (ref 38–126)
Anion gap: 5 (ref 5–15)
BUN: 32 mg/dL — ABNORMAL HIGH (ref 8–23)
CO2: 24 mmol/L (ref 22–32)
Calcium: 8.5 mg/dL — ABNORMAL LOW (ref 8.9–10.3)
Chloride: 113 mmol/L — ABNORMAL HIGH (ref 98–111)
Creatinine, Ser: 1.18 mg/dL (ref 0.61–1.24)
GFR, Estimated: >60 mL/min (ref >60)
Glucose, Bld: 115 mg/dL — ABNORMAL HIGH (ref 70–99)
Potassium: 4.3 mmol/L (ref 3.5–5.1)
Sodium: 142 mmol/L (ref 135–145)
Total Bilirubin: 0.5 mg/dL (ref 0.3–1.2)
Total Protein: 6.1 g/dL — ABNORMAL LOW (ref 6.5–8.1)

## 2021-05-03 LAB — CBC
HCT: 34 % — ABNORMAL LOW (ref 39.0–52.0)
Hemoglobin: 10.9 g/dL — ABNORMAL LOW (ref 13.0–17.0)
MCH: 28.7 pg (ref 26.0–34.0)
MCHC: 32.1 g/dL (ref 30.0–36.0)
MCV: 89.5 fL (ref 80.0–100.0)
Platelets: 225 10*3/uL (ref 150–400)
RBC: 3.8 MIL/uL — ABNORMAL LOW (ref 4.22–5.81)
RDW: 13.2 % (ref 11.5–15.5)
WBC: 9.1 10*3/uL (ref 4.0–10.5)
nRBC: 0 % (ref 0.0–0.2)

## 2021-05-03 LAB — GLUCOSE, CAPILLARY
Glucose-Capillary: 102 mg/dL — ABNORMAL HIGH (ref 70–99)
Glucose-Capillary: 107 mg/dL — ABNORMAL HIGH (ref 70–99)
Glucose-Capillary: 105 mg/dL — ABNORMAL HIGH (ref 70–99)
Glucose-Capillary: 118 mg/dL — ABNORMAL HIGH (ref 70–99)

## 2021-05-03 LAB — PHOSPHORUS: Phosphorus: 3.1 mg/dL (ref 2.5–4.6)

## 2021-05-03 LAB — BRAIN NATRIURETIC PEPTIDE: B Natriuretic Peptide: 205.2 pg/mL — ABNORMAL HIGH (ref 0.0–100.0)

## 2021-05-03 LAB — TRIGLYCERIDES: Triglycerides: 52 mg/dL (ref <150)

## 2021-05-03 LAB — D-DIMER, QUANTITATIVE: D-Dimer, Quant: 9.57 ug/mL-FEU — ABNORMAL HIGH (ref 0.00–0.50)

## 2021-05-03 LAB — MAGNESIUM: Magnesium: 1.9 mg/dL (ref 1.7–2.4)

## 2021-05-03 MED ORDER — LIDOCAINE 5 % EX PTCH
1.0000 | MEDICATED_PATCH | CUTANEOUS | Status: DC
  Administered 2021-05-03 – 2021-05-09 (×7): 1 via TRANSDERMAL
  Filled 2021-05-03 (×8): qty 1

## 2021-05-03 MED ORDER — HYDRALAZINE HCL 20 MG/ML IJ SOLN
10.0000 mg | Freq: Four times a day (QID) | INTRAMUSCULAR | Status: DC | PRN
  Administered 2021-05-04 – 2021-05-10 (×4): 10 mg via INTRAVENOUS
  Filled 2021-05-03 (×4): qty 1

## 2021-05-03 MED ORDER — INSULIN ASPART 100 UNIT/ML IJ SOLN
0.0000 [IU] | Freq: Four times a day (QID) | INTRAMUSCULAR | Status: DC
  Administered 2021-05-04 – 2021-05-06 (×5): 1 [IU] via SUBCUTANEOUS
  Administered 2021-05-06: 2 [IU] via SUBCUTANEOUS
  Administered 2021-05-07 (×4): 1 [IU] via SUBCUTANEOUS
  Administered 2021-05-08 (×2): 2 [IU] via SUBCUTANEOUS
  Administered 2021-05-09: 1 [IU] via SUBCUTANEOUS
  Administered 2021-05-09: 13:00:00 2 [IU] via SUBCUTANEOUS
  Filled 2021-05-03 (×11): qty 1

## 2021-05-03 MED ORDER — METOPROLOL TARTRATE 5 MG/5ML IV SOLN
INTRAVENOUS | Status: AC
  Administered 2021-05-03: 5 mg via INTRAVENOUS
  Filled 2021-05-03: qty 5

## 2021-05-03 MED ORDER — TRAVASOL 10 % IV SOLN
INTRAVENOUS | Status: AC
  Filled 2021-05-03: qty 764.6

## 2021-05-03 MED ORDER — METOPROLOL TARTRATE 5 MG/5ML IV SOLN
5.0000 mg | Freq: Four times a day (QID) | INTRAVENOUS | Status: DC
  Administered 2021-05-04 – 2021-05-09 (×18): 5 mg via INTRAVENOUS
  Filled 2021-05-03 (×20): qty 5

## 2021-05-03 MED ORDER — FENTANYL 25 MCG/HR TD PT72: 1.0000 | MEDICATED_PATCH | TRANSDERMAL | Status: DC

## 2021-05-03 MED ORDER — MORPHINE SULFATE (PF) 2 MG/ML IV SOLN
2.0000 mg | INTRAVENOUS | Status: AC
  Administered 2021-05-03 – 2021-05-06 (×18): 2 mg via INTRAVENOUS
  Filled 2021-05-03 (×18): qty 1

## 2021-05-03 MED ORDER — ACETAMINOPHEN 10 MG/ML IV SOLN
1000.0000 mg | Freq: Four times a day (QID) | INTRAVENOUS | Status: AC
  Administered 2021-05-03 – 2021-05-04 (×3): 1000 mg via INTRAVENOUS
  Filled 2021-05-03 (×4): qty 100

## 2021-05-03 MED ORDER — METHOCARBAMOL 1000 MG/10ML IJ SOLN
500.0000 mg | Freq: Three times a day (TID) | INTRAVENOUS | Status: DC
  Administered 2021-05-03 – 2021-05-09 (×17): 500 mg via INTRAVENOUS
  Filled 2021-05-03: qty 500
  Filled 2021-05-03 (×10): qty 5
  Filled 2021-05-03: qty 500
  Filled 2021-05-03 (×2): qty 5
  Filled 2021-05-03 (×2): qty 500
  Filled 2021-05-03 (×3): qty 5

## 2021-05-03 NOTE — Evaluation (Signed)
Physical Therapy Evaluation Patient Details Name: Carl [name] MRN: [id number] DOB: 26-Jan-1946 Today's Date: 05/03/2021   History of Present Illness  75 y.o. male presented to [facility] ED today for abdominal pain. Patient reports the acute onset of severe abdominal pain around 11 AM which seems to be worse in the RUQ and radiates to his right shoulder. Pain has been severe and constant in nature. Nothing seems to make this better. Pain worse with ambulation. Some nausea. No fever, chills, CP, SOB, urinary changes, or bowel changes. He does reportedly have a history of perforated ulcer in 2011 but this was managed conservatively. Last EGD was in 2018 with Dr Allen Norris which showed gastric ulceration. Patient is s/p perforated deodenal ulcer repair.  Clinical Impression  Patient received in bed, agreeable to PT assessment. Patient required mod assist for bed mobility. Min assist for sit to stand and min guard for ambulation around nurses station with RW. Patient limited by pain and fatigue. He will continue to benefit from skilled PT while here to improve mobility, endurance and safety.     Follow Up Recommendations Home health PT;Supervision/Assistance - 24 hour    Equipment Recommendations  None recommended by PT    Recommendations for Other Services       Precautions / Restrictions Precautions Precautions: Fall Restrictions Weight Bearing Restrictions: No      Mobility  Bed Mobility Overal bed mobility: Needs Assistance Bed Mobility: Supine to Sit     Supine to sit: Mod assist;HOB elevated     General bed mobility comments: mod assist to raise trunk to seated position    Transfers Overall transfer level: Needs assistance Equipment used: Rolling walker (2 wheeled) Transfers: Sit to/from Stand Sit to Stand: Min assist            Ambulation/Gait Ambulation/Gait assistance: Min guard Gait Distance (Feet): 200 Feet Assistive device: Rolling walker (2 wheeled) Gait  Pattern/deviations: Step-through pattern;Decreased step length - right;Decreased step length - left Gait velocity: decr   General Gait Details: fatigued with ambulation this distance.  Stairs            Wheelchair Mobility    Modified Rankin (Stroke Patients Only)       Balance Overall balance assessment: Needs assistance Sitting-balance support: Feet supported;Bilateral upper extremity supported Sitting balance-Leahy Scale: Fair     Standing balance support: Bilateral upper extremity supported;During functional activity Standing balance-Leahy Scale: Fair Standing balance comment: benefits from B UE support at this time and min guard                             Pertinent Vitals/Pain Pain Assessment: Faces Faces Pain Scale: Hurts even more Pain Location: abdomen Pain Descriptors / Indicators: Discomfort Pain Intervention(s): Monitored during session;Repositioned    Home Living Family/patient expects to be discharged to:: Private residence Living Arrangements: Spouse/significant other Available Help at Discharge: Family;Available 24 hours/day Type of Home: House Home Access: Ramped entrance     Home Layout: One level Home Equipment: Walker - 2 wheels Additional Comments: does not use AD at baseline, drives, reports independent    Prior Function Level of Independence: Independent               Hand Dominance   Dominant Hand: Right    Extremity/Trunk Assessment   Upper Extremity Assessment Upper Extremity Assessment: Overall WFL for tasks assessed    Lower Extremity Assessment Lower Extremity Assessment: Generalized weakness    Cervical /  Trunk Assessment Cervical / Trunk Assessment: Normal  Communication   Communication: No difficulties  Cognition Arousal/Alertness: Awake/alert Behavior During Therapy: WFL for tasks assessed/performed Overall Cognitive Status: Within Functional Limits for tasks assessed                                         General Comments      Exercises     Assessment/Plan    PT Assessment Patient needs continued PT services  PT Problem List Decreased strength;Decreased mobility;Decreased activity tolerance;Decreased balance;Pain;Decreased knowledge of use of DME       PT Treatment Interventions DME instruction;Therapeutic exercise;Gait training;Balance training;Functional mobility training;Therapeutic activities;Patient/family education    PT Goals (Current goals can be found in the Care Plan section)  Acute Rehab PT Goals Patient Stated Goal: to return home PT Goal Formulation: With patient Time For Goal Achievement: 05/17/21 Potential to Achieve Goals: Good    Frequency Min 2X/week   Barriers to discharge        Co-evaluation               AM-PAC PT "6 Clicks" Mobility  Outcome Measure Help needed turning from your back to your side while in a flat bed without using bedrails?: A Lot Help needed moving from lying on your back to sitting on the side of a flat bed without using bedrails?: A Lot Help needed moving to and from a bed to a chair (including a wheelchair)?: A Little Help needed standing up from a chair using your arms (e.g., wheelchair or bedside chair)?: A Little Help needed to walk in hospital room?: A Little Help needed climbing 3-5 steps with a railing? : A Little 6 Click Score: 16    End of Session Equipment Utilized During Treatment: Gait belt Activity Tolerance: Patient limited by fatigue Patient left: in chair;with call bell/phone within reach;with nursing/sitter in room Nurse Communication: Mobility status PT Visit Diagnosis: Other abnormalities of gait and mobility (R26.89);Muscle weakness (generalized) (M62.81);Pain Pain - part of body:  (abdomen)    Time: 1420-1450 PT Time Calculation (min) (ACUTE ONLY): 30 min   Charges:   PT Evaluation $PT Eval Moderate Complexity: 1 Mod PT Treatments $Gait Training: 8-22 mins         Kashon Kraynak, PT, GCS 05/03/21,3:44 PM

## 2021-05-03 NOTE — Consult Note (Signed)
PHARMACY - TOTAL PARENTERAL NUTRITION CONSULT NOTE   Indication:  Perforated duodenal ulcer, which will req NPO 5-7 days  Patient Measurements: Height: '5\' 7"'$  (170.2 cm) Weight: 93.4 kg (205 lb 14.6 oz) IBW/kg (Calculated) : 66.1 TPN AdjBW (KG): 72.7 Body mass index is 32.25 kg/m. Usual Weight: 86.6 kg on intake (87.1kg 01/2021)  Assessment: 75 y/o male with h/o gout, HTN, lung nodule, anxiety, depression, etoh abuse, DDD, stroke and PUD who is admitted for perforated duodenal ulcer now s/p robotic assisted laparoscopic repair   Glucose / Insulin: BG 118>102 (no SSI used)  Electrolytes: Lytes WNL today. Renal: Scr 1.9>1.39>1.18 Hepatic: ALT 10>97>63; AST 21>90>41; lipase 117 Intake / Output; MIVF: Net positive 2917m GI Imaging:  8/30: Abdominal XR: 1. Large stool burden without evidence of bowel obstruction. 2. No evidence of free air.  8/30: CT abd - duodenum with crescentic fluid and air collection; Given history of peptic ulcer disease, these findings in conjunction with small volume pneumoperitoneum are likely related to a perforated duodenal ulcer. GI Surgeries / Procedures:  8/30: ex-lap for perforation  Central access: 8/31 TPN start date: 8/31  Nutritional Goals: Goal TPN rate is 80 mL/hr (provides 113.3 g of protein and 1943 kcals per day)  RD Assessment: Estimated Needs Total Energy Estimated Needs: 2000-2300kcal/day Total Protein Estimated Needs: 100-115g/day Total Fluid Estimated Needs: 1.7-2.0L/day  Current Nutrition:  NPO  Plan:  Advance TPN to 54 mL/hr at 1800 (increase rate by 1/3's daily to goal of 80 ml/hr).  Nutritional components  Amino acids(10% Travasol): 76.46g Dextrose: 181.44g Lipids (20% SMOF): 38.8g Kcal: 1310.74 Total volume: 13964m(including overfill) Electrolytes in TPN (Standard): Na 5011mL, K 37m37m, Ca 5mEq16m Mg 5mEq/20mand Phos 15mmol70mCl:Ac 1:1 Add standard MVI and trace elements to TPN Add thiamine d2/3d [id number]  acid Changing to Sensitive q6h SSI. Adjust as needed (currently at goal '180mg'$ /dL) Monitor TPN labs on Mon/Thurs, including daily for first couple days until goal rate established.  BrandonNarda RutherfordD Pharmacy Resident  05/03/2021 10:00 AM

## 2021-05-03 NOTE — Consult Note (Addendum)
[facility] Medical Consultation  Carl [name] [id number] DOB: 02-22-1946 DOA: 05/01/2021 PCP: [facility]   Requesting physician: Dr.Pabon Date of consultation: 05/03/2021 Reason for consultation: Tachypnea, tachycardia, and altered mental status  Impression: 1) Sinus tachycardia 2) Acute hypoxic respiratory failure secondary to atelectasis, rule out PE 3) Mild acute encephalopathy likely due to hospital delirium  Recommendations: -The patient is currently n.p.o.  Home Coreg remains on hold.  Start Lopressor 5 mg IV every 6 hours scheduled -Chest x-ray personally reviewed by me shows no infiltrate or consolidation consistent with pneumonia.  Favors atelectasis.  Continue incentive spirometry.  We will obtain a D-dimer and if elevated will need a CT PE study to rule out pulmonary embolism.  We will obtain a BNP.  Clinically no signs of volume overload as there is no significant crackles on exam or peripheral edema.  No frank pulmonary edema or pleural effusions on chest x-ray.  He is currently on 6 L saturating well.   -He is mildly altered upon my exam. He can tell me his name, the date and who the president is.  He cannot tell me where he is.  He thinks he is in [place].  No metabolic causes for his encephalopathy per review of his labs.  Likely mildly altered due to hospital delirium.  The hospitalist team will followup again tomorrow. Please contact me if I can be of assistance in the meanwhile. Thank you for this consultation.  Chief Complaint: As above  HPI:  75-year-old Caucasian male with multiple chronic comorbidities who presented with abdominal pain.  He was found to have pneumoperitoneum on chest x-ray and CT abdomen pelvis perforated optic ulcer.  He is now status post robotic assisted repair of perforated duodenal ulcer with omental flap.  Denies any chest pain.  He denies any calf pain.  He denies any fevers or chills.  He denies any purulent cough.  Review  of Systems: 14 point review of systems is negative except for what is mentioned above in the HPI.   Past Medical History:  Diagnosis Date   Adenoma of colon    Alcoholism ([place])    still drinking wine as of Feb 2013   Anxiety and depression    Cataract    Cervical spondylosis without myelopathy    Cervicalgia of occipito-atlanto-axial region    Chronic pain disorder    Degeneration of cervical intervertebral disc    Degeneration, intervertebral disc, cervical    Duodenal ulcer 2011   Facet arthropathy, cervical    Fuchs' corneal dystrophy    Gastric ulcer 2011   egd at [place] Aug 2011.   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    LVH (left ventricular hypertrophy)    Mild cognitive impairment    Mitral insufficiency    Neck pain    Neuralgia and neuritis    Nocturia    Occipital neuralgia    Osteoarthritis    neck   Osteoarthritis    Pure hypercholesterolemia    Radiculitis    Stroke ([place])    aphasia, slight right sided weakness   Tricuspid insufficiency    Past Surgical History:  Procedure Laterality Date   COLONOSCOPY WITH PROPOFOL N/A 06/05/2018   Procedure: COLONOSCOPY WITH PROPOFOL;  Surgeon: Manya Silvas, MD;  Location[facility] ENDOSCOPY;  Service:  Endoscopy;  Laterality: N/A;   CYSTOGRAM  03/08   ESOPHAGOGASTRODUODENOSCOPY (EGD) WITH PROPOFOL N/A 06/20/2017   Procedure: ESOPHAGOGASTRODUODENOSCOPY (EGD) WITH PROPOFOL;  Surgeon: Lucilla Lame, MD;  Location: [facility] ENDOSCOPY;  Service: Endoscopy;  Laterality: N/A;   ETHMOIDECTOMY Right 03/01/2020   Procedure: ETHMOIDECTOMY;  Surgeon: Clyde Canterbury, MD;  Location: [facility] ORS;  Service: ENT;  Laterality: Right;   EYE SURGERY Bilateral    cataract   IMAGE GUIDED SINUS SURGERY N/A 03/01/2020   Procedure: IMAGE GUIDED SINUS SURGERY;  Surgeon: Clyde Canterbury, MD;  Location: [facility] ORS;   Service: ENT;  Laterality: N/A;   MAXILLARY ANTROSTOMY Bilateral 03/01/2020   Procedure: MAXILLARY ANTROSTOMY WITH TISSUE;  Surgeon: Clyde Canterbury, MD;  Location: [facility] ORS;  Service: ENT;  Laterality: Bilateral;   REPAIR OF PERFORATED ULCER  05/01/2021   Procedure: ROBOTIC REPAIR OF PERFORATED DUODENAL ULCER;  Surgeon: Jules Husbands, MD;  Location: [facility] ORS;  Service: General;;   SEPTOPLASTY N/A 03/01/2020   Procedure: SEPTOPLASTY;  Surgeon: Clyde Canterbury, MD;  Location: [facility] ORS;  Service: ENT;  Laterality: N/A;   TURBINATE REDUCTION Bilateral 03/01/2020   Procedure: TURBINATE REDUCTION;  Surgeon: Clyde Canterbury, MD;  Location: [facility] ORS;  Service: ENT;  Laterality: Bilateral;   XI ROBOT ASSISTED DIAGNOSTIC LAPAROSCOPY N/A 05/01/2021   Procedure: XI ROBOT ASSISTED DIAGNOSTIC LAPAROSCOPY;  Surgeon: Jules Husbands, MD;  Location: [facility] ORS;  Service: General;  Laterality: N/A;   Social History:  reports that he quit smoking about 42 years ago. His smoking use included cigarettes. He has a 30.00 pack-year smoking history. He has never used smokeless tobacco. He reports that he does not drink alcohol and does not use drugs.  Allergies  Allergen Reactions   Aspirin Other (See Comments)    Other reaction(s): Other (See Comments) He gets GI bleed.     Nsaids Other (See Comments)    Gi bleed   Ace Inhibitors Other (See Comments)    unknown   Lisinopril Other (See Comments)    unknown   Hydrochlorothiazide Other (See Comments)    Gout   Ramipril Other (See Comments)    unknown   Family History  Problem Relation Age of Onset   Fibromyalgia Mother    Hypertension Father    Diabetes Neg Hx     Prior to Admission medications   Medication Sig Start Date End Date Taking? Authorizing Provider  acetaminophen (TYLENOL) 500 MG tablet Take 500-1,000 mg by mouth every 6 (six) hours as needed for mild pain, moderate pain or fever.    Yes [provider]  amLODipine (NORVASC) 10 MG tablet Take  0.5 tablets (5 mg total) by mouth daily. 04/04/19  Yes Wieting, Richard, MD  atorvastatin (LIPITOR) 10 MG tablet Take 10 mg by mouth every evening.    Yes [provider]  carvedilol (COREG) 6.25 MG tablet Take 6.25 mg by mouth 2 (two) times daily. 09/28/19  Yes [provider]  Cholecalciferol 25 MCG (1000 UT) tablet Take 1,000 Units by mouth daily.    Yes [provider]  doxazosin (CARDURA) 8 MG tablet Take 8 mg by mouth at bedtime. 11/09/19  Yes [provider]  hydrALAZINE (APRESOLINE) 100 MG tablet Take 100 mg by mouth daily.  09/01/19  Yes [provider]  losartan (COZAAR) 100 MG tablet Take 100 mg by mouth daily. 11/09/19  Yes [provider]  potassium chloride SA (KLOR-CON) 20 MEQ tablet Take 20 mEq by mouth daily. 09/13/19  Yes [provider]  nystatin (MYCOSTATIN/NYSTOP) powder Apply 1 application topically 3 (three) times daily. 12/13/20   Nori Riis, PA-C   Physical Exam: Blood pressure (!) 147/94, pulse 79, temperature 98.3 F (36.8 C), temperature source Oral, resp. rate 19, height '5\' 7"'$  (1.702 m), weight 93.4 kg, SpO2 100 %. Vitals:   05/03/21 1536 05/03/21 2000  BP: (!) 173/98 (!) 147/94  Pulse: 79   Resp: 18 19  Temp: 97.8 F (36.6 C) 98.3 F (36.8 C)  SpO2: 95% 100%    General:  Appears calm and comfortable and is in NAD.  NG tube in place with nasal cannula in place as well.  Right arm PICC line in place. Cardiovascular:  RRR, no m/r/g.  Respiratory:   CTA bilaterally with no wheezes/rales/rhonchi.  Normal respiratory effort. Abdomen:  soft, peri-umbilical dressing clean dry and intact with no signs of infection.  Drain in place. Skin:  no rash or induration seen on limited exam Musculoskeletal:  grossly normal tone BUE/BLE, good ROM, no bony abnormality Lower extremity:  No LE edema.  Limited foot exam with no ulcerations.  2+ distal pulses. Psychiatric:  grossly normal mood and affect, speech fluent  and appropriate, AOx3 to name place and president.  He cannot tell me where he is. Neurologic:  CN 2-12 grossly intact, moves all extremities in coordinated fashion, sensation intact  Labs on Admission:  Basic Metabolic Panel: Recent Labs  Lab 05/01/21 1328 05/02/21 0746 05/03/21 0459  NA 140 142 142  K 4.2 4.1 4.3  CL 107 112* 113*  CO2 21* 22 24  GLUCOSE 162* 145* 115*  BUN 34* 34* 32*  CREATININE 1.90* 1.39* 1.18  CALCIUM 9.3 8.4* 8.5*  MG  --  1.7 1.9  PHOS  --  3.7 3.1   Liver Function Tests: Recent Labs  Lab 05/01/21 1328 05/02/21 0746 05/03/21 0459  AST 21 90* 41  ALT 10 97* 63*  ALKPHOS 54 36* 31*  BILITOT 0.7 0.4 0.5  PROT 7.1 6.2* 6.1*  ALBUMIN 3.7 3.2* 3.1*   Recent Labs  Lab 05/01/21 1328  LIPASE 117*   No results for input(s): AMMONIA in the last 168 hours. CBC: Recent Labs  Lab 05/01/21 1328 05/02/21 0746 05/03/21 0459  WBC 6.9 9.3 9.1  HGB 13.5 12.3* 10.9*  HCT 40.3 37.0* 34.0*  MCV 86.7 88.1 89.5  PLT 292 217 225   Cardiac Enzymes: No results for input(s): CKTOTAL, CKMB, CKMBINDEX, TROPONINI in the last 168 hours. BNP: Invalid input(s): POCBNP CBG: Recent Labs  Lab 05/02/21 1655 05/02/21 2140 05/03/21 0905 05/03/21 1135 05/03/21 1625  GLUCAP 112* 118* 102* 107* 105*    Radiological Exams on Admission: DG CHEST PORT 1 VIEW  Result Date: 05/03/2021 CLINICAL DATA:  Shortness of breath EXAM: PORTABLE CHEST 1 VIEW COMPARISON:  05/02/2021, 05/01/2021 FINDINGS: Esophageal tube tip overlies stomach, side-port in the region of GE junction. Cardiomegaly with subsegmental atelectasis. Small left-sided pleural effusion. Right upper extremity central venous catheter tip over the SVC. Aortic atherosclerosis. IMPRESSION: 1. Esophageal tube side-port in the region of GE junction, consider further advancement for more optimal positioning 2. Cardiomegaly with small left effusion and subsegmental atelectasis at the bases Electronically Signed   By:  Donavan Foil M.D.   On: 05/03/2021 19:53   DG Chest Port 1 View  Result Date: 05/02/2021 CLINICAL DATA:  PICC line placement and NG tube placement EXAM: PORTABLE CHEST 1 VIEW COMPARISON:  05/01/2021 FINDINGS: Interval placement of a right-sided PICC line with distal tip terminating  at the level of the right atrium. Interval placement of an enteric tube with distal tip and side port terminating within the expected location of the gastric body. Probable surgical drain is visualized within the abdomen. Stable heart size. Atherosclerotic calcification of the aortic knob. Low lung volumes with bibasilar atelectasis. Small bilateral pleural effusions are not excluded. No pneumothorax. No evidence of free intraperitoneal air under the diaphragm on the current study. IMPRESSION: 1. Interval placement of right-sided PICC line with distal tip terminating at the level of the right atrium. 2. Interval placement of enteric tube with distal tip and side port terminating within the expected location of the gastric body. 3. Bibasilar atelectasis. Electronically Signed   By: Davina Poke D.O.   On: 05/02/2021 11:31   DG Abd Portable 1V  Result Date: 05/02/2021 CLINICAL DATA:  NG placement EXAM: PORTABLE ABDOMEN - 1 VIEW COMPARISON:  05/01/2021 FINDINGS: NG tube in the midbody of the stomach.  Normal bowel gas pattern. Surgical drain in the region of the epigastrium. IMPRESSION: NG tube in the stomach.  Normal bowel gas pattern. Electronically Signed   By: Franchot Gallo M.D.   On: 05/02/2021 13:59   [place] EKG SITE RITE  Result Date: 05/02/2021 If Site Rite image not attached, placement could not be confirmed due to current cardiac rhythm.   EKG: Independently reviewed.  Sinus tachycardia.  Time spent: 35 minutes  [facility]  If 7PM-7AM, please contact night-coverage [email or web address] Password [email or web address] 05/03/2021, 10:10 PM

## 2021-05-03 NOTE — Progress Notes (Signed)
CC: per ulcer  Subjective: POD # 2 robotic repair w omentopexy Doing well Serous output from drain 130cc Bilious output from NGT 100cc but was clogged upon assessment this am Some pain but improved as compared to preop  Objective: Vital signs in last 24 hours: Temp:  [97.5 F (36.4 C)-98.1 F (36.7 C)] 97.8 F (36.6 C) (09/01 0444) Pulse Rate:  [68-85] 70 (09/01 0444) Resp:  [16-26] 16 (09/01 0444) BP: (135-154)/(80-90) 150/87 (09/01 0444) SpO2:  [93 %-100 %] 96 % (09/01 0444) FiO2 (%):  [60 %] 60 % (08/31 1500) Weight:  [93.4 kg] 93.4 kg (08/31 1120)    Intake/Output from previous day: 08/31 0701 - 09/01 0700 In: 1245.7 [P.O.:300; I.V.:685.7; NG/GT:60; IV Piggyback:200] Out: 790 [Urine:560; Emesis/NG output:100; Drains:130] Intake/Output this shift: No intake/output data recorded.  Physical exam: NAD Abd: soft, periumbilical dressing changed to dry one. All incisions are C/D/I no infection. Drain serous no peritonitis.  Lab Results: CBC  Recent Labs    05/02/21 0746 05/03/21 0459  WBC 9.3 9.1  HGB 12.3* 10.9*  HCT 37.0* 34.0*  PLT 217 225   BMET Recent Labs    05/02/21 0746 05/03/21 0459  NA 142 142  K 4.1 4.3  CL 112* 113*  CO2 22 24  GLUCOSE 145* 115*  BUN 34* 32*  CREATININE 1.39* 1.18  CALCIUM 8.4* 8.5*   PT/INR No results for input(s): LABPROT, INR in the last 72 hours. ABG No results for input(s): PHART, HCO3 in the last 72 hours.  Invalid input(s): PCO2, PO2  Studies/Results: DG Chest 2 View  Result Date: 05/01/2021 CLINICAL DATA:  Right lower quadrant pain EXAM: CHEST - 2 VIEW COMPARISON:  None. FINDINGS: Cardiac and mediastinal contours are unchanged. Bibasilar atelectasis. Lungs otherwise clear. No large pleural effusion or pneumothorax. Crescentic lucency projecting under the right hemidiaphragm. IMPRESSION: Findings concerning for pneumoperitoneum. Recommend further evaluation with contrast-enhanced CT of the abdomen and pelvis.  Critical Value/emergent results were called by telephone at the time of interpretation on 05/01/2021 at 2:01 pm to provider [facility] , who verbally acknowledged these results. Electronically Signed   By: Yetta Glassman M.D.   On: 05/01/2021 14:02   DG Abd 1 View  Result Date: 05/01/2021 CLINICAL DATA:  Evaluate for duodenal perforation. EXAM: ABDOMEN - 1 VIEW COMPARISON:  None. FINDINGS: The bowel gas pattern is normal. There is no definite evidence of free air. A large amount of stool is seen throughout the colon. A curvilinear radiopaque surgical drain is seen overlying the right upper quadrant. Radiopaque contrast is noted within the bilateral renal collecting systems. IMPRESSION: 1. Large stool burden without evidence of bowel obstruction. 2. No evidence of free air. Electronically Signed   By: Virgina Norfolk M.D.   On: 05/01/2021 20:49   CT ABDOMEN PELVIS W CONTRAST  Addendum Date: 05/01/2021   ADDENDUM REPORT: 05/01/2021 15:45 IMPRESSION: Re-evaluation of the duodenum demonstrates a crescentic approximately 2.1 x 1.3 cm fluid and air collection along the lateral aspect of the junction of the first and second portions of the duodenum. Given history of peptic ulcer disease, these findings in conjunction with small volume pneumoperitoneum are likely related to a perforated duodenal ulcer. The changes surrounding the gallbladder are likely reactive. These results were called by telephone at the time of interpretation on 05/01/2021 at 3:43 pm to provider DR. Malyna Budney, who verbally acknowledged these results. Electronically Signed   By: Albin Felling M.D.   On: 05/01/2021 15:45   Result Date: 05/01/2021 CLINICAL  DATA:  Abdominal abscess/infection suspected EXAM: CT ABDOMEN AND PELVIS WITH CONTRAST TECHNIQUE: Multidetector CT imaging of the abdomen and pelvis was performed using the standard protocol following bolus administration of intravenous contrast. CONTRAST:  63m OMNIPAQUE IOHEXOL 350 MG/ML  SOLN COMPARISON:  None. FINDINGS: Inferior chest: The lung bases are well-aerated. Hepatobiliary: The liver is normal in size. There are a couple scattered subcentimeter hypodense lesions which are likely benign. No intrahepatic or extrahepatic biliary ductal dilation. There is thickening of the gallbladder wall and mild hyperenhancement with pericholecystic fluid and small foci of air surrounding the gallbladder. Spleen: Normal size. Small hypodense lesion in the anterior spleen is stable, benign. Pancreas: No pancreatic ductal dilatation or surrounding inflammatory changes. Adrenals/Urinary Tract: Adrenal glands are unremarkable. The kidneys are normal in size without hydronephrosis or renal calculi. Scattered small hypodense lesions measure simple fluid attenuation or too small to definitively characterize, but are likely benign. Bladder is unremarkable. Stomach/Bowel: The stomach, small bowel and large bowel are normal in caliber without abnormal wall thickening or surrounding inflammatory changes. No pneumatosis. Reproductive: The prostate gland is enlarged. Lymphatic: No enlarged lymph nodes in the abdomen or pelvis. Vasculature: The abdominal aorta is normal in caliber. Diffuse calcific atherosclerotic disease. The portal venous system is patent. Other: Small volume free fluid in the dependent pelvis. There is small volume pneumoperitoneum. Musculoskeletal: Multilevel degenerative changes of the lumbar spine. No aggressive osseous lesions. The soft tissues are unremarkable. IMPRESSION: Inflammatory changes surrounding the gallbladder with surrounding air and small volume pneumoperitoneum is concerning for ruptured, acute cholecystitis. These results were called by telephone at the time of interpretation on 05/01/2021 at 3:13 pm to provider PMerlyn Lot, who verbally acknowledged these results. Electronically Signed: By: YAlbin FellingM.D. On: 05/01/2021 15:14   DG Chest Port 1 View  Result Date:  05/02/2021 CLINICAL DATA:  PICC line placement and NG tube placement EXAM: PORTABLE CHEST 1 VIEW COMPARISON:  05/01/2021 FINDINGS: Interval placement of a right-sided PICC line with distal tip terminating at the level of the right atrium. Interval placement of an enteric tube with distal tip and side port terminating within the expected location of the gastric body. Probable surgical drain is visualized within the abdomen. Stable heart size. Atherosclerotic calcification of the aortic knob. Low lung volumes with bibasilar atelectasis. Small bilateral pleural effusions are not excluded. No pneumothorax. No evidence of free intraperitoneal air under the diaphragm on the current study. IMPRESSION: 1. Interval placement of right-sided PICC line with distal tip terminating at the level of the right atrium. 2. Interval placement of enteric tube with distal tip and side port terminating within the expected location of the gastric body. 3. Bibasilar atelectasis. Electronically Signed   By: NDavina PokeD.O.   On: 05/02/2021 11:31   DG Abd Portable 1V  Result Date: 05/02/2021 CLINICAL DATA:  NG placement EXAM: PORTABLE ABDOMEN - 1 VIEW COMPARISON:  05/01/2021 FINDINGS: NG tube in the midbody of the stomach.  Normal bowel gas pattern. Surgical drain in the region of the epigastrium. IMPRESSION: NG tube in the stomach.  Normal bowel gas pattern. Electronically Signed   By: CFranchot GalloM.D.   On: 05/02/2021 13:59   [place]KG SITE RITE  Result Date: 05/02/2021 If Site Rite image not attached, placement could not be confirmed due to current cardiac rhythm.   Anti-infectives: Anti-infectives (From admission, onward)    Start     Dose/Rate Route Frequency Ordered Stop   05/01/21 2200  piperacillin-tazobactam (ZOSYN) IVPB 3.375  g        3.375 g 12.5 mL/hr over 240 Minutes Intravenous Every 8 hours 05/01/21 2125 05/08/21 2159   05/01/21 1530  piperacillin-tazobactam (ZOSYN) IVPB 3.375 g  Status:  Discontinued         3.375 g 12.5 mL/hr over 240 Minutes Intravenous Every 8 hours 05/01/21 1521 05/01/21 2125   05/01/21 1445  cefTRIAXone (ROCEPHIN) 1 g in sodium chloride 0.9 % 100 mL IVPB  Status:  Discontinued        1 g 200 mL/hr over 30 Minutes Intravenous  Once 05/01/21 1432 05/01/21 1521   05/01/21 1445  metroNIDAZOLE (FLAGYL) IVPB 500 mg  Status:  Discontinued        500 mg 100 mL/hr over 60 Minutes Intravenous  Once 05/01/21 1432 05/01/21 1521       Assessment/Plan: COntiue NPO NGT until Tuesday and Upper GI ordered for Tuesday. HE had a large ulceration and I want to be more conservative than usual DC Foley Mobilize Continue A/Bs, PPIs and TPN We will work w pharmacy adjusting IV narcotics and potentially doing fentanyl patch for better prolonged effect of narcotic meds   Caroleen Hamman, MD, [facility]  05/03/2021

## 2021-05-04 ENCOUNTER — Inpatient Hospital Stay: Payer: Medicare Other

## 2021-05-04 DIAGNOSIS — K668 Other specified disorders of peritoneum: Secondary | ICD-10-CM

## 2021-05-04 DIAGNOSIS — J9601 Acute respiratory failure with hypoxia: Secondary | ICD-10-CM

## 2021-05-04 DIAGNOSIS — N179 Acute kidney failure, unspecified: Secondary | ICD-10-CM

## 2021-05-04 DIAGNOSIS — K631 Perforation of intestine (nontraumatic): Secondary | ICD-10-CM

## 2021-05-04 LAB — RENAL FUNCTION PANEL
Albumin: 2.9 g/dL — ABNORMAL LOW (ref 3.5–5.0)
Anion gap: 3 — ABNORMAL LOW (ref 5–15)
BUN: 23 mg/dL (ref 8–23)
CO2: 28 mmol/L (ref 22–32)
Calcium: 8.3 mg/dL — ABNORMAL LOW (ref 8.9–10.3)
Chloride: 108 mmol/L (ref 98–111)
Creatinine, Ser: 0.98 mg/dL (ref 0.61–1.24)
GFR, Estimated: >60 mL/min (ref >60)
Glucose, Bld: 110 mg/dL — ABNORMAL HIGH (ref 70–99)
Phosphorus: 2.1 mg/dL — ABNORMAL LOW (ref 2.5–4.6)
Potassium: 3.7 mmol/L (ref 3.5–5.1)
Sodium: 139 mmol/L (ref 135–145)

## 2021-05-04 LAB — GLUCOSE, CAPILLARY
Glucose-Capillary: 112 mg/dL — ABNORMAL HIGH (ref 70–99)
Glucose-Capillary: 115 mg/dL — ABNORMAL HIGH (ref 70–99)
Glucose-Capillary: 119 mg/dL — ABNORMAL HIGH (ref 70–99)
Glucose-Capillary: 128 mg/dL — ABNORMAL HIGH (ref 70–99)
Glucose-Capillary: 129 mg/dL — ABNORMAL HIGH (ref 70–99)

## 2021-05-04 LAB — MAGNESIUM: Magnesium: 1.8 mg/dL (ref 1.7–2.4)

## 2021-05-04 MED ORDER — HYDROMORPHONE HCL 1 MG/ML IJ SOLN
0.5000 mg | Freq: Once | INTRAMUSCULAR | Status: AC
  Administered 2021-05-04: 12:00:00 0.5 mg via INTRAVENOUS
  Filled 2021-05-04: qty 1

## 2021-05-04 MED ORDER — ACETAMINOPHEN 10 MG/ML IV SOLN
1000.0000 mg | Freq: Four times a day (QID) | INTRAVENOUS | Status: AC
Start: 2021-05-04 — End: 2021-05-05
  Administered 2021-05-04 – 2021-05-05 (×4): 1000 mg via INTRAVENOUS
  Filled 2021-05-04 (×4): qty 100

## 2021-05-04 MED ORDER — TRAVASOL 10 % IV SOLN
INTRAVENOUS | Status: AC
  Filled 2021-05-04: qty 1141.4

## 2021-05-04 NOTE — Progress Notes (Signed)
Subjective: POD # 3 robotic repair w Phillip Heal patch/omentopexy Doing well Serous output from drain 10? 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  Objective: Vital signs in last 24 hours: Temp:  [97.8 F (36.6 C)-98.3 F (36.8 C)] 97.8 F (36.6 C) (09/02 0900) Pulse Rate:  [64-79] 64 (09/02 0900) Resp:  [17-21] 20 (09/02 0900) BP: (147-180)/(74-100) 155/81 (09/02 0900) SpO2:  [95 %-100 %] 97 % (09/02 0900) Weight:  [85.2 kg] 85.2 kg (09/02 0337)    Intake/Output from previous day: 09/01 0701 - 09/02 0700 In: 1296.5 [I.V.:506.5; NG/GT:90; IV Piggyback:700] Out: [id number] [Urine:3225; Emesis/NG output:450; Drains:20] Intake/Output this shift: Total I/O In: -  Out: 185 [Urine:175; Drains:10]  Physical exam: NAD Abd: soft, periumbilical dressing changed again today. All incisions are C/D/I no infection. Drain serous no tenderness/or guarding.  Lab Results: CBC  Recent Labs    05/02/21 0746 05/03/21 0459  WBC 9.3 9.1  HGB 12.3* 10.9*  HCT 37.0* 34.0*  PLT 217 225    BMET Recent Labs    05/03/21 0459 05/04/21 0505  NA 142 139  K 4.3 3.7  CL 113* 108  CO2 24 28  GLUCOSE 115* 110*  BUN 32* 23  CREATININE 1.18 0.98  CALCIUM 8.5* 8.3*    PT/INR No results for input(s): LABPROT, INR in the last 72 hours. ABG No results for input(s): PHART, HCO3 in the last 72 hours.  Invalid input(s): PCO2, PO2  Studies/Results: US Venous Img Lower Bilateral (DVT)  Result Date: 05/04/2021 CLINICAL DATA:  Dyspnea.  History of tobacco abuse. EXAM: BILATERAL LOWER EXTREMITY VENOUS DOPPLER ULTRASOUND TECHNIQUE: Gray-scale sonography with compression, as well as color and duplex ultrasound, were performed to evaluate the deep venous system(s) from the level of the common femoral vein through the popliteal and proximal calf  veins. COMPARISON:  11/25/2011 FINDINGS: VENOUS Normal compressibility of the common femoral, superficial femoral, and popliteal veins, as well as the visualized calf veins. Visualized portions of profunda femoral vein and great saphenous vein unremarkable. No filling defects to suggest DVT on grayscale or color Doppler imaging. Doppler waveforms show normal direction of venous flow, normal respiratory phasicity and response to augmentation. Limited views of the contralateral common femoral vein are unremarkable. OTHER None. Limitations: none IMPRESSION: No femoropopliteal DVT nor evidence of DVT within the visualized calf veins. If clinical symptoms are inconsistent or if there are persistent or worsening symptoms, further imaging (possibly involving the iliac veins) may be warranted. Electronically Signed   By: Lucrezia Europe M.D.   On: 05/04/2021 07:29   DG CHEST PORT 1 VIEW  Result Date: 05/03/2021 CLINICAL DATA:  Shortness of breath EXAM: PORTABLE CHEST 1 VIEW COMPARISON:  05/02/2021, 05/01/2021 FINDINGS: Esophageal tube tip overlies stomach, side-port in the region of GE junction. Cardiomegaly with subsegmental atelectasis. Small left-sided pleural effusion. Right upper extremity central venous catheter tip over the SVC. Aortic atherosclerosis. IMPRESSION: 1. Esophageal tube side-port in the region of GE junction, consider further advancement for more optimal positioning 2. Cardiomegaly with small left effusion and subsegmental atelectasis at the bases Electronically Signed   By: Donavan Foil M.D.   On: 05/03/2021 19:53   DG Chest Port 1 View  Result Date: 05/02/2021 CLINICAL DATA:  PICC line placement and NG tube placement EXAM: PORTABLE CHEST 1 VIEW COMPARISON:  05/01/2021 FINDINGS: Interval placement of a  right-sided PICC line with distal tip terminating at the level of the right atrium. Interval placement of an enteric tube with distal tip and side port terminating within the expected location of the  gastric body. Probable surgical drain is visualized within the abdomen. Stable heart size. Atherosclerotic calcification of the aortic knob. Low lung volumes with bibasilar atelectasis. Small bilateral pleural effusions are not excluded. No pneumothorax. No evidence of free intraperitoneal air under the diaphragm on the current study. IMPRESSION: 1. Interval placement of right-sided PICC line with distal tip terminating at the level of the right atrium. 2. Interval placement of enteric tube with distal tip and side port terminating within the expected location of the gastric body. 3. Bibasilar atelectasis. Electronically Signed   By: Davina Poke D.O.   On: 05/02/2021 11:31   DG Abd Portable 1V  Result Date: 05/02/2021 CLINICAL DATA:  NG placement EXAM: PORTABLE ABDOMEN - 1 VIEW COMPARISON:  05/01/2021 FINDINGS: NG tube in the midbody of the stomach.  Normal bowel gas pattern. Surgical drain in the region of the epigastrium. IMPRESSION: NG tube in the stomach.  Normal bowel gas pattern. Electronically Signed   By: Franchot Gallo M.D.   On: 05/02/2021 13:59    Anti-infectives: Anti-infectives (From admission, onward)    Start     Dose/Rate Route Frequency Ordered Stop   05/01/21 2200  piperacillin-tazobactam (ZOSYN) IVPB 3.375 g        3.375 g 12.5 mL/hr over 240 Minutes Intravenous Every 8 hours 05/01/21 2125 05/08/21 2159   05/01/21 1530  piperacillin-tazobactam (ZOSYN) IVPB 3.375 g  Status:  Discontinued        3.375 g 12.5 mL/hr over 240 Minutes Intravenous Every 8 hours 05/01/21 1521 05/01/21 2125   05/01/21 1445  cefTRIAXone (ROCEPHIN) 1 g in sodium chloride 0.9 % 100 mL IVPB  Status:  Discontinued        1 g 200 mL/hr over 30 Minutes Intravenous  Once 05/01/21 1432 05/01/21 1521   05/01/21 1445  metroNIDAZOLE (FLAGYL) IVPB 500 mg  Status:  Discontinued        500 mg 100 mL/hr over 60 Minutes Intravenous  Once 05/01/21 1432 05/01/21 1521       Assessment/Plan: Continue NPO NGT  until Tuesday and Upper GI ordered for Tuesday. HE had a large ulceration and we want to be more conservative than usual Mobilize Continue Antibioticss, PPIs and TPN We will work w pharmacy adjusting IV narcotics and potentially doing fentanyl patch for better prolonged effect of narcotic meds  I considered initiation of misoprostol, and trial of IV toradol, but elected to defer and not risk any additional gi bleeding/risk.   Ronny Bacon, M.D., [facility]  05/04/2021 ; 10:51 AM

## 2021-05-04 NOTE — Plan of Care (Signed)
Pt orientedx3, moments of confusion when waking but easily reoriented. ST on tele back down to NSR after starting IV metoprolol per MAR. Weaned from 8L HFNC to 5L overnight. TPN infusing w/o issue. Turns self in bed, impulsive at times and all fall precautions in place and education provided. NGT to low-intermittent suction, see I/O flowsheet for output, flushed 2m q4hr per orders. Dressings changed to JP drain site per orders, tolerated well. Rounding performed, needs/concerns addressed during shift.   Problem: Education: Goal: Knowledge of General Education information will improve Description: Including pain rating scale, medication(s)/side effects and non-pharmacologic comfort measures 05/04/2021 0604 by FJocelyn Lamer RN Outcome: Progressing 05/03/2021 2256 by FJocelyn Lamer RN Outcome: Progressing   Problem: Health Behavior/Discharge Planning: Goal: Ability to manage health-related needs will improve 05/04/2021 0604 by FJocelyn Lamer RN Outcome: Progressing 05/03/2021 2256 by FJocelyn Lamer RN Outcome: Progressing   Problem: Clinical Measurements: Goal: Ability to maintain clinical measurements within normal limits will improve 05/04/2021 0604 by FJocelyn Lamer RN Outcome: Progressing 05/03/2021 2256 by FJocelyn Lamer RN Outcome: Progressing Goal: Will remain free from infection 05/04/2021 0604 by FJocelyn Lamer RN Outcome: Progressing 05/03/2021 2256 by FJocelyn Lamer RN Outcome: Progressing Goal: Diagnostic test results will improve 05/04/2021 0604 by FJocelyn Lamer RN Outcome: Progressing 05/03/2021 2256 by FJocelyn Lamer RN Outcome: Progressing Goal: Respiratory complications will improve 05/04/2021 0604 by FJocelyn Lamer RN Outcome: Progressing 05/03/2021 2256 by FJocelyn Lamer RN Outcome: Progressing Goal: Cardiovascular complication will be avoided 05/04/2021 0604 by FJocelyn Lamer RN Outcome: Progressing 05/03/2021 2256 by FJocelyn Lamer RN Outcome:  Progressing   Problem: Activity: Goal: Risk for activity intolerance will decrease 05/04/2021 0604 by FJocelyn Lamer RN Outcome: Progressing 05/03/2021 2256 by FJocelyn Lamer RN Outcome: Progressing   Problem: Nutrition: Goal: Adequate nutrition will be maintained 05/04/2021 0604 by FJocelyn Lamer RN Outcome: Progressing 05/03/2021 2256 by FJocelyn Lamer RN Outcome: Progressing   Problem: Coping: Goal: Level of anxiety will decrease 05/04/2021 0604 by FJocelyn Lamer RN Outcome: Progressing 05/03/2021 2256 by FJocelyn Lamer RN Outcome: Progressing   Problem: Elimination: Goal: Will not experience complications related to bowel motility 05/04/2021 0604 by FJocelyn Lamer RN Outcome: Progressing 05/03/2021 2256 by FJocelyn Lamer RN Outcome: Progressing Goal: Will not experience complications related to urinary retention 05/04/2021 0604 by FJocelyn Lamer RN Outcome: Progressing 05/03/2021 2256 by FJocelyn Lamer RN Outcome: Progressing   Problem: Pain Managment: Goal: General experience of comfort will improve 05/04/2021 0604 by FJocelyn Lamer RN Outcome: Progressing 05/03/2021 2256 by FJocelyn Lamer RN Outcome: Progressing   Problem: Safety: Goal: Ability to remain free from injury will improve 05/04/2021 0604 by FJocelyn Lamer RN Outcome: Progressing 05/03/2021 2256 by FJocelyn Lamer RN Outcome: Progressing   Problem: Skin Integrity: Goal: Risk for impaired skin integrity will decrease 05/04/2021 0604 by FJocelyn Lamer RN Outcome: Progressing 05/03/2021 2256 by FJocelyn Lamer RN Outcome: Progressing

## 2021-05-04 NOTE — TOC Initial Note (Signed)
Transition of Care [name]) - Initial/Assessment Note    Patient Details  Name: Carl [name] MRN: [id number] Date of Birth: Jul 23, 1946  Transition of Care [facility]) CM/SW Contact:    Shelbie Hutching, RN Phone Number: 05/04/2021, 6:32 PM  Clinical Narrative:                 Patient admitted to the hospital with bowel perforation.  S/P ex lap for bowl repair.  Patient has an NG tube to suction and TPN infusing.   RNCM met with patient at the bedside.  Patient is from home with his wife and before coming into the hospital was independent and drove.  TOC will follow for disposition, currently recommendation from PT is for home health services.  TOC will follow patient progression and arrange services closer to discharge.    Expected Discharge Plan: [place] Barriers to Discharge: Continued Medical Work up   Patient Goals and CMS Choice        Expected Discharge Plan and Services Expected Discharge Plan: [place]   Discharge Planning Services: CM Consult   Living arrangements for the past 2 months: Single Family Home                 DME Arranged: N/A DME Agency: NA                  Prior Living Arrangements/Services Living arrangements for the past 2 months: Single Family Home Lives with:: Spouse Patient language and need for interpreter reviewed:: Yes Do you feel safe going back to the place where you live?: Yes      Need for Family Participation in Patient Care: Yes (Comment) (bowel perf) Care giver support system in place?: Yes (comment) (wife) Current home services: DME (has all needed DME- walker, bedside commode) Criminal Activity/Legal Involvement Pertinent to Current Situation/Hospitalization: No - Comment as needed  Activities of Daily Living Home Assistive Devices/Equipment: None ADL Screening (condition at time of admission) Patient's cognitive ability adequate to safely complete daily activities?: Yes Is the patient deaf or  have difficulty hearing?: No Does the patient have difficulty seeing, even when wearing glasses/contacts?: No Does the patient have difficulty concentrating, remembering, or making decisions?: No Patient able to express need for assistance with ADLs?: Yes Does the patient have difficulty dressing or bathing?: No Independently performs ADLs?: Yes (appropriate for developmental age) Does the patient have difficulty walking or climbing stairs?: No Weakness of Legs: None Weakness of Arms/Hands: None  Permission Sought/Granted Permission sought to share information with : Case Manager, Family Supports Permission granted to share information with : Yes, Verbal Permission Granted  Share Information with NAME: Alessander Sikorski     Permission granted to share info w Relationship: wife     Emotional Assessment Appearance:: Appears stated age Attitude/Demeanor/Rapport: Engaged Affect (typically observed): Accepting Orientation: : Oriented to Self, Oriented to Place, Oriented to  Time, Oriented to Situation Alcohol / Substance Use: Not Applicable Psych Involvement: No (comment)  Admission diagnosis:  Pneumoperitoneum [K66.8] Perforated viscus [R19.8] Bowel perforation (HCC) [K63.1] Patient Active Problem List   Diagnosis Date Noted   Pneumoperitoneum 05/01/2021   Bowel perforation ([place]) 05/01/2021   Acute renal failure (ARF) ([place]) 04/02/2019   Neurogenic pain 12/21/2018   Cervical spondylosis with radiculopathy 10/13/2018   Melena 09/15/2018   Acute esophagogastric ulcer 09/15/2018   Duodenum ulcer 09/15/2018   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   Chronic neck pain (Primary Area of Pain) (Left)  07/09/2018   Chronic pain syndrome 07/09/2018   Pharmacologic therapy 07/09/2018   Disorder of skeletal system 07/09/2018   Problems influencing health status 07/09/2018   Chronic facial pain (Secondary Area of Pain) (Left) 07/09/2018   Cervicalgia (Left) 07/09/2018   Abnormal behavior 04/01/2018    Socially inappropriate behavior 04/01/2018   History of adenomatous polyp of colon 03/20/2018   History of upper gastrointestinal bleeding 03/20/2018   GIB (gastrointestinal bleeding) 06/19/2017   Occipital neuralgia (Left) 03/04/2017   GI bleed 03/03/2017   History of stroke 03/03/2017   Right sided weakness 03/03/2017   Slurred speech 03/03/2017   Word finding difficulty 03/03/2017   Opiate use 03/03/2017   Chronic headaches 03/03/2017   Chronic knee pain (Bilateral) 03/03/2017   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   Adjustment disorder with mixed disturbance of emotions and conduct 12/06/2016   Cervical spondylosis without myelopathy 05/16/2015   Cervicalgia of occipito-atlanto-axial region (Left) 02/09/2015   Intractable 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 10/21/2013   Chronic pain disorder 09/09/2013   Neuralgia, neuritis, and radiculitis, unspecified 09/09/2013   Cognitive disorder 06/23/2013   Depression, major, single episode, moderate ([place]) 06/23/2013   Mild cognitive impairment 06/23/2013   Anxiety and depression 05/05/2013   Cervical facet arthropathy (Left) 05/05/2013   Spondylosis without myelopathy or radiculopathy, cervical region 05/05/2013   Nocturia 04/22/2013   LVH (left ventricular hypertrophy) 02/19/2013   Mitral insufficiency 02/19/2013   Pure hypercholesterolemia 02/19/2013   Tricuspid insufficiency 02/19/2013   Cataracts, bilateral 08/21/2012   Fuchs' corneal dystrophy 08/21/2012   Alcohol dependence in remission ([place]) 03/14/2011   DDD (degenerative disc disease), cervical 03/14/2011   Persistent insomnia 03/14/2011   Osteoarthritis involving multiple joints 03/14/2011   Primary generalized (osteo)arthritis 03/14/2011   ANXIETY, SITUATIONAL 06/15/2010   Situational anxiety 06/15/2010   CELLULITIS, HAND, LEFT 05/03/2010   Cellulitis of unspecified part of limb 05/03/2010   Unspecified contact dermatitis due to plants, except  food 01/12/2010   Sprain of joints and ligaments of unspecified parts of neck, initial encounter 01/03/2010   LUNG NODULE 07/17/2009   Chest pain 07/13/2009   UNSPECIFIED PROSTATITIS 06/15/2009   Alcohol abuse 05/15/2009   Alcohol withdrawal ([place]) 04/24/2009   Impaired fasting glucose 11/04/2007   Gout 01/02/2007   Essential hypertension 01/02/2007   PCP:  [facility]:   [id number] [place], [employer] [address] AT [address] [place] [address] Phone: [phone] Fax: [phone]     Social Determinants of Health (SDOH) Interventions    Readmission Risk Interventions Readmission Risk Prevention Plan 05/04/2021  Transportation Screening Complete  PCP or Specialist Appt within 3-5 Days Complete  HRI or [place] Complete  Social Work Consult for [place] Planning/Counseling Complete  Palliative Care Screening Not Applicable  Medication Review Press photographer) Complete  Some recent data might be hidden

## 2021-05-04 NOTE — Consult Note (Signed)
PHARMACY - TOTAL PARENTERAL NUTRITION CONSULT NOTE   Indication:  Perforated duodenal ulcer, which will req NPO 5-7 days  Patient Measurements: Height: '5\' 7"'$  (170.2 cm) Weight: 85.2 kg (187 lb 13.3 oz) IBW/kg (Calculated) : 66.1 TPN AdjBW (KG): 72.7 Body mass index is 29.42 kg/m. Usual Weight: 86.6 kg on intake (87.1kg 01/2021)  Assessment: 75 y/o male with h/o gout, HTN, lung nodule, anxiety, depression, etoh abuse, DDD, stroke and PUD who is admitted for perforated duodenal ulcer now s/p robotic assisted laparoscopic repair   Glucose / Insulin: BG 105>119 (no SSI used)  Electrolytes: Lytes WNL today. Renal: Scr 1.9>1.39>1.18>0.98 Hepatic: ALT 10>97>63; AST 21>90>41; lipase 117; TG 52 Intake / Output; MIVF: Net positive +503.54m GI Imaging:  8/30: Abdominal XR: 1. Large stool burden without evidence of bowel obstruction. 2. No evidence of free air.  8/30: CT abd - duodenum with crescentic fluid and air collection; Given history of peptic ulcer disease, these findings in conjunction with small volume pneumoperitoneum are likely related to a perforated duodenal ulcer. GI Surgeries / Procedures:  8/30: ex-lap for perforation  Central access: 8/31 TPN start date: 8/31  Nutritional Goals: Goal TPN rate is 82 mL/hr (provides 114.1 g of protein and 2050 kcals per day)  RD Assessment: Estimated Needs Total Energy Estimated Needs: 2000-2300kcal/day Total Protein Estimated Needs: 100-115g/day Total Fluid Estimated Needs: 1.7-2.0L/day  Current Nutrition:  NPO  Plan:  Advanced TPN to goal 82 mL/hr at 1800 Electrolytes in TPN (Standard): Na 574m/L, K 5072mL, Ca 5mE71m, Mg 5mEq40m and Phos 15mmo100m Cl:Ac 1:1 Add standard MVI and trace elements to TPN Add thiamine d3/3d 0000000olic acid Continue sensitive q6h SSI. Adjust as needed (currently at goal '180mg'$ /dL) Monitor TPN labs on Mon/Thurs, including daily for first couple days until goal rate established.  CaroliWynelle ClevelandrmD Pharmacy Resident  05/04/2021 6:48 AM

## 2021-05-04 NOTE — Progress Notes (Signed)
PROGRESS NOTE    Carl [name]  [id number] DOB: 1945-09-12 DOA: 05/01/2021 PCP: [facility]    Chief Complaint  Patient presents with   Abdominal Pain    Brief Narrative:  H/o former smoker and alcoholism, history of gout , gastric ulcer ,hypertension, CVA with mild cognitive impairment admitted to surgery service due to perforated duodenal ulcer, pneumoperitoneum status post laparoscopic repair of perforated duodenal ulcer on 8/30, postop patient developed sinus tachycardia acute hypoxic respiratory failure and acute encephalopathy likely due to hospital delirium, hospitalist consulted for postop medical management  Subjective:  Is calm and cooperative, oriented x3 today Tachycardia has resolved, hypoxia improved He denies short of breath, no cough, no fever, no chest pain, no edema  Assessment & Plan:   Active Problems:   Pneumoperitoneum   Bowel perforation (HCC)  Acute hypoxic respiratory failure , likely due to atelectasis -Cxr "cardiomegaly with small left effusion and subsegmental atelectasis at the bases" -Ddimer elevated -Venous doppler suboptimal study no dvt -Echo pending, though patient does not appear all volume overloaded -Consider CTA if hypoxia does not improve , he is appear improving with less oxygen requirement, tachycardia has resolved -Encourage incentive spirometer  Tachycardia, resolved on iv lopressor  Hypertension On Coreg, Norvasc, hydralazine, Cozaar , Cardura at home Currently n.p.o., on IV Lopressor  AKI Cr improved  Repeat BMP, renal dosing meds  Perforated duodenal ulcer status post repair Currently on IV Protonix, NG suction, IV Zosyn, TPN Management per general surgery   Nutritional Assessment:  The patient's BMI is: Body mass index is 29.42 kg/m.Marland Kitchen  Seen by dietician.  I agree with the assessment and plan as outlined below:  Nutrition Status: Nutrition Problem: Inadequate oral intake Etiology: acute  illness Signs/Symptoms: NPO status    .    Unresulted Labs (From admission, onward)     Start     Ordered   05/08/21 0500  Creatinine, serum  (enoxaparin (LOVENOX)    CrCl >/= 30 ml/min)  Weekly,   TIMED     Comments: while on enoxaparin therapy    05/01/21 2125   05/07/21 0500  Triglycerides  (TPN Lab Panel)  Every Monday (0500),   TIMED      05/02/21 1147   05/07/21 0500  Comprehensive metabolic panel  (TPN Lab Panel)  Every Mon,Thu (0500),   TIMED      05/03/21 0947   05/07/21 0500  Magnesium  (TPN Lab Panel)  Every Mon,Thu (0500),   TIMED      05/03/21 0947   05/03/21 0500  Phosphorus  (TPN Lab Panel)  Every Mon,Thu (0500),   TIMED      05/02/21 1147              DVT prophylaxis: enoxaparin (LOVENOX) injection 40 mg Start: 05/02/21 1000 SCDs Start: 05/01/21 2126   Code Status: Full Family Communication: Patient Disposition:   Status is: Inpatient   Dispo: The patient is from: Home              Anticipated d/c is to: Home              Anticipated d/c date is: To be determined                 Consultants:  General surgery primary Tried hospitalist as medical consult  Procedures:  Perforated duodenal ulcer repair  Antimicrobials:   Anti-infectives (From admission, onward)    Start     Dose/Rate Route Frequency Ordered Stop  05/01/21 2200  piperacillin-tazobactam (ZOSYN) IVPB 3.375 g        3.375 g 12.5 mL/hr over 240 Minutes Intravenous Every 8 hours 05/01/21 2125 05/08/21 2159   05/01/21 1530  piperacillin-tazobactam (ZOSYN) IVPB 3.375 g  Status:  Discontinued        3.375 g 12.5 mL/hr over 240 Minutes Intravenous Every 8 hours 05/01/21 1521 05/01/21 2125   05/01/21 1445  cefTRIAXone (ROCEPHIN) 1 g in sodium chloride 0.9 % 100 mL IVPB  Status:  Discontinued        1 g 200 mL/hr over 30 Minutes Intravenous  Once 05/01/21 1432 05/01/21 1521   05/01/21 1445  metroNIDAZOLE (FLAGYL) IVPB 500 mg  Status:  Discontinued        500 mg 100 mL/hr over 60  Minutes Intravenous  Once 05/01/21 1432 05/01/21 1521           Objective: Vitals:   05/04/21 0135 05/04/21 0337 05/04/21 0511 05/04/21 0515  BP: (!) 159/86  (!) 180/74 (!) 159/88  Pulse:      Resp: (!) '21  19 17  '$ Temp:   98.3 F (36.8 C)   TempSrc:   Oral   SpO2:   98%   Weight:  85.2 kg    Height:        Intake/Output Summary (Last 24 hours) at 05/04/2021 0809 Last data filed at 05/04/2021 0537 Gross per 24 hour  Intake 1296.52 ml  Output 3695 ml  Net -2398.48 ml   Filed Weights   05/01/21 2200 05/02/21 1120 05/04/21 0337  Weight: 92.7 kg 93.4 kg 85.2 kg    Examination:  General exam: calm, NAD, NG placed Respiratory system: Diminished at bases, no wheezing, no rales, no rhonchi, respiratory effort normal. Cardiovascular system: S1 & S2 heard, RRR. No JVD, no murmur, No pedal edema. Gastrointestinal system: Postop changes , hypoactive bowel sounds . Central nervous system: Alert and oriented. No focal neurological deficits. Extremities: Symmetric 5 x 5 power. Skin: No rashes, lesions or ulcers Psychiatry: Judgement and insight appear normal. Mood & affect appropriate.     Data Reviewed: I have personally reviewed following labs and imaging studies  CBC: Recent Labs  Lab 05/01/21 1328 05/02/21 0746 05/03/21 0459  WBC 6.9 9.3 9.1  HGB 13.5 12.3* 10.9*  HCT 40.3 37.0* 34.0*  MCV 86.7 88.1 89.5  PLT 292 217 [id number]    Basic Metabolic Panel: Recent Labs  Lab 05/01/21 1328 05/02/21 0746 05/03/21 0459 05/04/21 0505  NA 140 142 142 139  K 4.2 4.1 4.3 3.7  CL 107 112* 113* 108  CO2 21* '22 24 28  '$ GLUCOSE 162* 145* 115* 110*  BUN 34* 34* 32* 23  CREATININE 1.90* 1.39* 1.18 0.98  CALCIUM 9.3 8.4* 8.5* 8.3*  MG  --  1.7 1.9 1.8  PHOS  --  3.7 3.1 2.1*    GFR: Estimated Creatinine Clearance: 68.9 mL/min (by C-G formula based on SCr of 0.98 mg/dL).  Liver Function Tests: Recent Labs  Lab 05/01/21 1328 05/02/21 0746 05/03/21 0459 05/04/21 0505   AST 21 90* 41  --   ALT 10 97* 63*  --   ALKPHOS 54 36* 31*  --   BILITOT 0.7 0.4 0.5  --   PROT 7.1 6.2* 6.1*  --   ALBUMIN 3.7 3.2* 3.1* 2.9*    CBG: Recent Labs  Lab 05/03/21 1135 05/03/21 1625 05/03/21 2318 05/04/21 0604 05/04/21 0802  GLUCAP 107* 105* 118* 119* 115*     Recent  Results (from the past 240 hour(s))  Resp Panel by RT-PCR (Flu A&B, Covid) Nasopharyngeal Swab     Status: None   Collection Time: 05/01/21  2:39 PM   Specimen: Nasopharyngeal Swab; Nasopharyngeal(NP) swabs in vial transport medium  Result Value Ref Range Status   SARS Coronavirus 2 by RT PCR NEGATIVE NEGATIVE Final    Comment: (NOTE) SARS-CoV-2 target nucleic acids are NOT DETECTED.  The SARS-CoV-2 RNA is generally detectable in upper respiratory specimens during the acute phase of infection. The lowest concentration of SARS-CoV-2 viral copies this assay can detect is 138 copies/mL. A negative result does not preclude SARS-Cov-2 infection and should not be used as the sole basis for treatment or other patient management decisions. A negative result may occur with  improper specimen collection/handling, submission of specimen other than nasopharyngeal swab, presence of viral mutation(s) within the areas targeted by this assay, and inadequate number of viral copies(<138 copies/mL). A negative result must be combined with clinical observations, patient history, and epidemiological information. The expected result is Negative.  Fact Sheet for Patients:  EntrepreneurPulse.com.au  Fact Sheet for Healthcare Providers:  IncredibleEmployment.be  This test is no t yet approved or cleared by the [place] FDA and  has been authorized for detection and/or diagnosis of SARS-CoV-2 by FDA under an Emergency Use Authorization (EUA). This EUA will remain  in effect (meaning this test can be used) for the duration of the COVID-19 declaration under Section  564(b)(1) of the Act, 21 U.S.C.section 360bbb-3(b)(1), unless the authorization is terminated  or revoked sooner.       Influenza A by PCR NEGATIVE NEGATIVE Final   Influenza B by PCR NEGATIVE NEGATIVE Final    Comment: (NOTE) The Xpert Xpress SARS-CoV-2/FLU/RSV plus assay is intended as an aid in the diagnosis of influenza from Nasopharyngeal swab specimens and should not be used as a sole basis for treatment. Nasal washings and aspirates are unacceptable for Xpert Xpress SARS-CoV-2/FLU/RSV testing.  Fact Sheet for Patients: EntrepreneurPulse.com.au  Fact Sheet for Healthcare Providers: IncredibleEmployment.be  This test is not yet approved or cleared by the [place] FDA and has been authorized for detection and/or diagnosis of SARS-CoV-2 by FDA under an Emergency Use Authorization (EUA). This EUA will remain in effect (meaning this test can be used) for the duration of the COVID-19 declaration under Section 564(b)(1) of the Act, 21 U.S.C. section 360bbb-3(b)(1), unless the authorization is terminated or revoked.  Performed at [facility], [address] [place], [place] [address]   MRSA Next Gen by PCR, Nasal     Status: None   Collection Time: 05/01/21 10:14 PM   Specimen: Nasal Mucosa; Nasal Swab  Result Value Ref Range Status   MRSA by PCR Next Gen NOT DETECTED NOT DETECTED Final    Comment: (NOTE) The GeneXpert MRSA Assay (FDA approved for NASAL specimens only), is one component of a comprehensive MRSA colonization surveillance program. It is not intended to diagnose MRSA infection nor to guide or monitor treatment for MRSA infections. Test performance is not FDA approved in patients less than 77 years old. Performed at [facility], [address] [place], [place] [address]          Radiology Studies: US Venous Img Lower Bilateral (DVT)  Result Date: 05/04/2021 CLINICAL DATA:  Dyspnea.  History  of tobacco abuse. EXAM: BILATERAL LOWER EXTREMITY VENOUS DOPPLER ULTRASOUND TECHNIQUE: Gray-scale sonography with compression, as well as color and duplex ultrasound, were performed to evaluate the deep venous  system(s) from the level of the common femoral vein through the popliteal and proximal calf veins. COMPARISON:  11/25/2011 FINDINGS: VENOUS Normal compressibility of the common femoral, superficial femoral, and popliteal veins, as well as the visualized calf veins. Visualized portions of profunda femoral vein and great saphenous vein unremarkable. No filling defects to suggest DVT on grayscale or color Doppler imaging. Doppler waveforms show normal direction of venous flow, normal respiratory phasicity and response to augmentation. Limited views of the contralateral common femoral vein are unremarkable. OTHER None. Limitations: none IMPRESSION: No femoropopliteal DVT nor evidence of DVT within the visualized calf veins. If clinical symptoms are inconsistent or if there are persistent or worsening symptoms, further imaging (possibly involving the iliac veins) may be warranted. Electronically Signed   By: Lucrezia Europe M.D.   On: 05/04/2021 07:29   DG CHEST PORT 1 VIEW  Result Date: 05/03/2021 CLINICAL DATA:  Shortness of breath EXAM: PORTABLE CHEST 1 VIEW COMPARISON:  05/02/2021, 05/01/2021 FINDINGS: Esophageal tube tip overlies stomach, side-port in the region of GE junction. Cardiomegaly with subsegmental atelectasis. Small left-sided pleural effusion. Right upper extremity central venous catheter tip over the SVC. Aortic atherosclerosis. IMPRESSION: 1. Esophageal tube side-port in the region of GE junction, consider further advancement for more optimal positioning 2. Cardiomegaly with small left effusion and subsegmental atelectasis at the bases Electronically Signed   By: Donavan Foil M.D.   On: 05/03/2021 19:53   DG Chest Port 1 View  Result Date: 05/02/2021 CLINICAL DATA:  PICC line placement and NG  tube placement EXAM: PORTABLE CHEST 1 VIEW COMPARISON:  05/01/2021 FINDINGS: Interval placement of a right-sided PICC line with distal tip terminating at the level of the right atrium. Interval placement of an enteric tube with distal tip and side port terminating within the expected location of the gastric body. Probable surgical drain is visualized within the abdomen. Stable heart size. Atherosclerotic calcification of the aortic knob. Low lung volumes with bibasilar atelectasis. Small bilateral pleural effusions are not excluded. No pneumothorax. No evidence of free intraperitoneal air under the diaphragm on the current study. IMPRESSION: 1. Interval placement of right-sided PICC line with distal tip terminating at the level of the right atrium. 2. Interval placement of enteric tube with distal tip and side port terminating within the expected location of the gastric body. 3. Bibasilar atelectasis. Electronically Signed   By: Davina Poke D.O.   On: 05/02/2021 11:31   DG Abd Portable 1V  Result Date: 05/02/2021 CLINICAL DATA:  NG placement EXAM: PORTABLE ABDOMEN - 1 VIEW COMPARISON:  05/01/2021 FINDINGS: NG tube in the midbody of the stomach.  Normal bowel gas pattern. Surgical drain in the region of the epigastrium. IMPRESSION: NG tube in the stomach.  Normal bowel gas pattern. Electronically Signed   By: Franchot Gallo M.D.   On: 05/02/2021 13:59        Scheduled Meds:  Chlorhexidine Gluconate Cloth  6 each Topical Q0600   enoxaparin (LOVENOX) injection  40 mg Subcutaneous Q24H   insulin aspart  0-9 Units Subcutaneous Q6H   lidocaine  1 patch Transdermal Q24H   metoprolol tartrate  5 mg Intravenous Q6H    morphine injection  2 mg Intravenous Q4H   pantoprazole (PROTONIX) IV  40 mg Intravenous Q12H   sodium chloride flush  10-40 mL Intracatheter Q12H   Continuous Infusions:  acetaminophen Stopped (05/04/21 0537)   methocarbamol (ROBAXIN) IV 500 mg (05/04/21 0623)    piperacillin-tazobactam (ZOSYN)  IV 3.375 g (05/04/21 0513)  TPN ADULT (ION) 54 mL/hr at 05/04/21 0308   TPN ADULT (ION)       LOS: 3 days   Time spent: 35 mins Greater than 50% of this time was spent in counseling, explanation of diagnosis, planning of further management, and coordination of care.   Voice Recognition Viviann Spare dictation system was used to create this note, attempts have been made to correct errors. Please contact the author with questions and/or clarifications.   Florencia Reasons, MD PhD FACP [facility]  Available via Epic secure chat 7am-7pm for nonurgent issues Please page for urgent issues To page the attending provider between 7A-7P or the covering provider during after hours 7P-7A, please log into the web site [email or web address] and access using universal [place] password for that web site. If you do not have the password, please call the hospital operator.    05/04/2021, 8:09 AM

## 2021-05-04 NOTE — Care Management Important Message (Signed)
Important Message  Patient Details  Name: Carl [name] MRN: [id number] Date of Birth: Mar 21, 1946   Medicare Important Message Given:  N/A - LOS <3 / Initial given by admissions  Initial Medicare IM reviewed with [name], spouse, at [phone] by Tomma Lightning, Patient Access Associate on 05/04/2021 at 10:58am.   [name] 05/04/2021, 11:13 AM

## 2021-05-05 ENCOUNTER — Inpatient Hospital Stay (HOSPITAL_COMMUNITY)
Admit: 2021-05-05 | Discharge: 2021-05-05 | Disposition: A | Discharge status: Home / Self Care | Payer: Medicare Other | Attending: Internal Medicine | Admitting: Internal Medicine

## 2021-05-05 DIAGNOSIS — I5030 Unspecified diastolic (congestive) heart failure: Secondary | ICD-10-CM

## 2021-05-05 LAB — COMPREHENSIVE METABOLIC PANEL
ALT: 29 U/L (ref 0–44)
AST: 17 U/L (ref 15–41)
Albumin: 2.7 g/dL — ABNORMAL LOW (ref 3.5–5.0)
Alkaline Phosphatase: 35 U/L — ABNORMAL LOW (ref 38–126)
Anion gap: 6 (ref 5–15)
BUN: 27 mg/dL — ABNORMAL HIGH (ref 8–23)
CO2: 27 mmol/L (ref 22–32)
Calcium: 8.7 mg/dL — ABNORMAL LOW (ref 8.9–10.3)
Chloride: 107 mmol/L (ref 98–111)
Creatinine, Ser: 1.09 mg/dL (ref 0.61–1.24)
GFR, Estimated: >60 mL/min (ref >60)
Glucose, Bld: 86 mg/dL (ref 70–99)
Potassium: 3.7 mmol/L (ref 3.5–5.1)
Sodium: 140 mmol/L (ref 135–145)
Total Bilirubin: 0.5 mg/dL (ref 0.3–1.2)
Total Protein: 6.4 g/dL — ABNORMAL LOW (ref 6.5–8.1)

## 2021-05-05 LAB — CBC
HCT: 32.5 % — ABNORMAL LOW (ref 39.0–52.0)
Hemoglobin: 10.7 g/dL — ABNORMAL LOW (ref 13.0–17.0)
MCH: 28.7 pg (ref 26.0–34.0)
MCHC: 32.9 g/dL (ref 30.0–36.0)
MCV: 87.1 fL (ref 80.0–100.0)
Platelets: 207 10*3/uL (ref 150–400)
RBC: 3.73 MIL/uL — ABNORMAL LOW (ref 4.22–5.81)
RDW: 13.1 % (ref 11.5–15.5)
WBC: 7.5 10*3/uL (ref 4.0–10.5)
nRBC: 0 % (ref 0.0–0.2)

## 2021-05-05 LAB — ECHOCARDIOGRAM COMPLETE
AR max vel: 2.25 cm2
AV Peak grad: 10.9 mmHg
Ao pk vel: 1.65 m/s
Area-P 1/2: 2.16 cm2
Height: 67 in
S' Lateral: 2.7 cm
Weight: 2952.4 oz

## 2021-05-05 LAB — MAGNESIUM: Magnesium: 1.9 mg/dL (ref 1.7–2.4)

## 2021-05-05 LAB — PHOSPHORUS: Phosphorus: 2.6 mg/dL (ref 2.5–4.6)

## 2021-05-05 LAB — GLUCOSE, CAPILLARY: Glucose-Capillary: 130 mg/dL — ABNORMAL HIGH (ref 70–99)

## 2021-05-05 MED ORDER — TRAVASOL 10 % IV SOLN
INTRAVENOUS | Status: AC
  Filled 2021-05-05: qty 1141.4

## 2021-05-05 MED ORDER — ACETAMINOPHEN 10 MG/ML IV SOLN: 1000.0000 mg | Freq: Four times a day (QID) | INTRAVENOUS | Status: DC | PRN

## 2021-05-05 NOTE — Plan of Care (Signed)
Patient orientedx3, confused to hospital but easily reoriented. On 1-2L [place]. PRN hydralazine used x1 for SBP >170 when unable to give scheduled metoprolol due HR <60. TPN infusing per orders. IV ABX given per MAR, tolerated well. Dressings changed to umbilical site and LLQ drain, minimal drain output overnight. Appx 461m out in NGT, flushed q4hr with 384mwater per orders[facility] but no BM this shift. Adequate UO. Fall/safety precautions in place, rounding performed, needs/concerns addressed during shift.   Problem: Education: Goal: Knowledge of General Education information will improve Description: Including pain rating scale, medication(s)/side effects and non-pharmacologic comfort measures Outcome: Progressing   Problem: Health Behavior/Discharge Planning: Goal: Ability to manage health-related needs will improve Outcome: Progressing   Problem: Clinical Measurements: Goal: Ability to maintain clinical measurements within normal limits will improve Outcome: Progressing Goal: Will remain free from infection Outcome: Progressing Goal: Diagnostic test results will improve Outcome: Progressing Goal: Respiratory complications will improve Outcome: Progressing Goal: Cardiovascular complication will be avoided Outcome: Progressing   Problem: Activity: Goal: Risk for activity intolerance will decrease Outcome: Progressing   Problem: Nutrition: Goal: Adequate nutrition will be maintained Outcome: Progressing   Problem: Coping: Goal: Level of anxiety will decrease Outcome: Progressing   Problem: Elimination: Goal: Will not experience complications related to bowel motility Outcome: Progressing Goal: Will not experience complications related to urinary retention Outcome: Progressing   Problem: Pain Managment: Goal: General experience of comfort will improve Outcome: Progressing   Problem: Safety: Goal: Ability to remain free from injury will improve Outcome:  Progressing   Problem: Skin Integrity: Goal: Risk for impaired skin integrity will decrease Outcome: Progressing

## 2021-05-05 NOTE — Progress Notes (Signed)
*  PRELIMINARY RESULTS* Echocardiogram 2D Echocardiogram has been performed.  Carl [name] 05/05/2021, 2:35 PM

## 2021-05-05 NOTE — Consult Note (Signed)
PHARMACY - TOTAL PARENTERAL NUTRITION CONSULT NOTE   Indication:  Perforated duodenal ulcer, which will req NPO 5-7 days  Patient Measurements: Height: '5\' 7"'$  (170.2 cm) Weight: 83.7 kg (184 lb 8.4 oz) IBW/kg (Calculated) : 66.1 TPN AdjBW (KG): 72.7 Body mass index is 28.9 kg/m. Usual Weight: 86.6 kg on intake (87.1kg 01/2021)  Assessment: 75 y/o male with h/o gout, HTN, lung nodule, anxiety, depression, etoh abuse, DDD, stroke and PUD who is admitted for perforated duodenal ulcer now s/p robotic assisted laparoscopic repair   Glucose / Insulin: BG 105>119 > 86 (no SSI used)  Electrolytes: Lytes WNL today. Renal: Scr 1.9>1.39>1.18>0.98 > 1.09 Hepatic: ALT 10>97>63; AST 21>90>41; lipase 117; TG 52 Intake / Output; MIVF: Net positive +503.70m GI Imaging:  8/30: Abdominal XR: 1. Large stool burden without evidence of bowel obstruction. 2. No evidence of free air.  8/30: CT abd - duodenum with crescentic fluid and air collection; Given history of peptic ulcer disease, these findings in conjunction with small volume pneumoperitoneum are likely related to a perforated duodenal ulcer. GI Surgeries / Procedures:  8/30: ex-lap for perforation  Central access: 8/31 TPN start date: 8/31  Nutritional Goals: Goal TPN rate is 82 mL/hr (provides 114.1 g of protein and 2050 kcals per day)  RD Assessment: Estimated Needs Total Energy Estimated Needs: 2000-2300kcal/day Total Protein Estimated Needs: 100-115g/day Total Fluid Estimated Needs: 1.7-2.0L/day  Current Nutrition:  NPO  Plan:  Advanced TPN to goal 82 mL/hr at 1800 Electrolytes in TPN (Standard): Na 533m/L, K 5060mL, Ca 5mE44m, Mg 5mEq85m and Phos 15mmo49m Cl:Ac 1:1 Add standard MVI and trace elements to TPN Add folic acid. D/c thiamine  Continue sensitive q6h SSI. Adjust as needed (currently at goal '180mg'$ /dL) Monitor TPN labs on Mon/Thurs, including daily for first couple days until goal rate established.  KishanOswald HillockrmD Pharmacy Resident  05/05/2021 10:11 AM

## 2021-05-05 NOTE — Progress Notes (Signed)
Physical Therapy Treatment Patient Details Name: Carl [name] MRN: [id number] DOB: 01-15-1946 Today's Date: 05/05/2021    History of Present Illness 75 y.o. male presented to [facility] ED today for abdominal pain. Patient reports the acute onset of severe abdominal pain around 11 AM which seems to be worse in the RUQ and radiates to his right shoulder. Pain has been severe and constant in nature. Nothing seems to make this better. Pain worse with ambulation. Some nausea. No fever, chills, CP, SOB, urinary changes, or bowel changes. He does reportedly have a history of perforated ulcer in 2011 but this was managed conservatively. Last EGD was in 2018 with Dr Allen Norris which showed gastric ulceration. Patient is s/p perforated deodenal ulcer repair.    PT Comments    Pt had just hit call bell upon arriving requesting to go to [facility] for BM. He endorses severe pain throughout session on L side of head. Rated pain at 6/10. RN aware. He required assistance to exit L side of bed. Stood to [employer] and easily took several steps to [facility]. Unsuccessful attempt at having BM however urinated 200 CC. Session progressed to standing and ambulating around bed ~ 15 ft total. Limited by pain. Acute PT will continue to follow and progress as able per current POC. Author feels pt will be appropriate for HHPT by the time he is cleared for DC. Pt is expecting to remain in hospital for several more days.   Follow Up Recommendations  Home health PT;Supervision/Assistance - 24 hour     Equipment Recommendations  Rolling walker with 5" wheels       Precautions / Restrictions Precautions Precautions: Fall Precaution Comments: NG tube/ TPN Restrictions Weight Bearing Restrictions: No    Mobility  Bed Mobility Overal bed mobility: Needs Assistance Bed Mobility: Supine to Sit     Supine to sit: Min assist;HOB elevated     General bed mobility comments: MIn assist to exit L side of bed    Transfers Overall transfer level: Needs  assistance Equipment used: Rolling walker (2 wheeled) Transfers: Sit to/from Stand Sit to Stand: Min assist         General transfer comment: min assist form lowest bed height. Pt stoof to RW and easily took steps to [facility] with Chief Strategy Officer mostly assisting with management of IV/JP/NG tubing  Ambulation/Gait Ambulation/Gait assistance: Min guard Gait Distance (Feet): 15 Feet Assistive device: Rolling walker (2 wheeled) Gait Pattern/deviations: Step-through pattern;Decreased step length - right;Decreased step length - left Gait velocity: decreased   General Gait Details: pt was easily able to ambulate 15 ft from [facility] on one side of bed to recliner on opposite side of bed. Unwilling to ambulate further distances 2/2 to pain/head hurting         Cognition Arousal/Alertness: Awake/alert Behavior During Therapy: Flat affect Overall Cognitive Status: Within Functional Limits for tasks assessed        General Comments: Pt is A and oriented however somewhat flat. He agrees to get OOB to [facility] however requested not to ambulate due to headache. RN is aware. AAuthor also informed RN NG tube suction not properly performing. She arrived to assist at conclusion of session             Pertinent Vitals/Pain Pain Assessment: 0-10 Pain Score: 6  Faces Pain Scale: Hurts even more Pain Location: headache L side Pain Descriptors / Indicators: Sharp Pain Intervention(s): Limited activity within patient's tolerance;Monitored during session;Repositioned;Heat applied (heating pad reapplied to pt's neck/upper back)  PT Goals (current goals can now be found in the care plan section) Acute Rehab PT Goals Patient Stated Goal: get well and return home Progress towards PT goals: Progressing toward goals    Frequency    Min 2X/week      PT Plan Current plan remains appropriate       AM-PAC PT "6 Clicks" Mobility   Outcome Measure  Help needed turning from your back to your side while in a  flat bed without using bedrails?: A Lot Help needed moving from lying on your back to sitting on the side of a flat bed without using bedrails?: A Lot Help needed moving to and from a bed to a chair (including a wheelchair)?: A Little Help needed standing up from a chair using your arms (e.g., wheelchair or bedside chair)?: A Little Help needed to walk in hospital room?: A Little Help needed climbing 3-5 steps with a railing? : A Little 6 Click Score: 16    End of Session   Activity Tolerance: Patient limited by fatigue;Other (comment);Patient limited by pain (limited by head hurting) Patient left: in chair;with call bell/phone within reach;with chair alarm set;with family/visitor present;Other (comment) (RN aware of NG suction concerns) Nurse Communication: Mobility status PT Visit Diagnosis: Other abnormalities of gait and mobility (R26.89);Muscle weakness (generalized) (M62.81);Pain Pain - Right/Left: Left Pain - part of body:  (head)     Time: [id number] PT Time Calculation (min) (ACUTE ONLY): 30 min  Charges:  $Gait Training: 8-22 mins $Therapeutic Activity: 8-22 mins                    Julaine Fusi PTA 05/05/21, 1:31 PM

## 2021-05-05 NOTE — Progress Notes (Signed)
Patient ID: Carl [name], male   DOB: 07/20/1946, 75 y.o.   MRN: [id number]     [place] Hospital Day(s): 4.   Interval History: Patient seen and examined, no acute events or new complaints overnight. Patient reports having headaches.  He endorses having headaches usually.  He usually takes Tylenol and an NSAID for headache.  He thinks that the chronic NSAID taking is the cause of his PUD with ulcers.  Vital signs in last 24 hours: [min-max] current  Temp:  [98 F (36.7 C)-98.8 F (37.1 C)] 98 F (36.7 C) (09/03 0755) Pulse Rate:  [59-73] 67 (09/03 0900) Resp:  [15-22] 20 (09/03 0755) BP: (129-175)/(68-94) 165/68 (09/03 0900) SpO2:  [94 %-97 %] 94 % (09/03 0755) Weight:  [83.7 kg] 83.7 kg (09/03 0252)     Height: '5\' 7"'$  (170.2 cm) Weight: 83.7 kg BMI (Calculated): 28.89   Physical Exam:  Constitutional: alert, cooperative and no distress  Respiratory: breathing non-labored at rest  Cardiovascular: regular rate and sinus rhythm  Gastrointestinal: soft, non-tender, and non-distended.  Wounds are dry and clean  Labs:  CBC Latest Ref Rng & Units 05/05/2021 05/03/2021 05/02/2021  WBC 4.0 - 10.5 K/uL 7.5 9.1 9.3  Hemoglobin 13.0 - 17.0 g/dL 10.7(L) 10.9(L) 12.3(L)  Hematocrit 39.0 - 52.0 % 32.5(L) 34.0(L) 37.0(L)  Platelets 150 - 400 K/uL 207 225 217   CMP Latest Ref Rng & Units 05/05/2021 05/04/2021 05/03/2021  Glucose 70 - 99 mg/dL 86 110(H) 115(H)  BUN 8 - 23 mg/dL 27(H) 23 32(H)  Creatinine 0.61 - 1.24 mg/dL 1.09 0.98 1.18  Sodium 135 - 145 mmol/L 140 139 142  Potassium 3.5 - 5.1 mmol/L 3.7 3.7 4.3  Chloride 98 - 111 mmol/L 107 108 113(H)  CO2 22 - 32 mmol/L '27 28 24  '$ Calcium 8.9 - 10.3 mg/dL 8.7(L) 8.3(L) 8.5(L)  Total Protein 6.5 - 8.1 g/dL 6.4(L) - 6.1(L)  Total Bilirubin 0.3 - 1.2 mg/dL 0.5 - 0.5  Alkaline Phos 38 - 126 U/L 35(L) - 31(L)  AST 15 - 41 U/L 17 - 41  ALT 0 - 44 U/L 29 - 63(H)    Imaging studies: No new pertinent imaging studies   Assessment/Plan:  75  y.o. male with peptic ulcer disease with perforation 4 Days Post-Op s/p robotic assisted laparoscopic repair of duodenal ulcer and Graham patch.  From the surgical standpoint patient has been recovering slowly but adequately.  His main complaint is headache which is usually a chronic issue for him.  We will continue with IV Tylenol.  Will continue with same plan of n.p.o., IV antibiotic, nasogastric tube decompression and TPN.  We will also continue with high-dose Protonix.  We will perform upper GI series next Tuesday.  We will continue with DVT prophylaxis.   Carl Long, MD

## 2021-05-05 NOTE — Progress Notes (Signed)
PROGRESS NOTE    Carl [name]  [id number] DOB: Jun 29, 1946 DOA: 05/01/2021 PCP: [facility]    Chief Complaint  Patient presents with   Abdominal Pain    Brief Narrative:  H/o former smoker and alcoholism, history of gout , gastric ulcer ,hypertension, CVA with mild cognitive impairment admitted to surgery service due to perforated duodenal ulcer, pneumoperitoneum status post laparoscopic repair of perforated duodenal ulcer on 8/30, postop patient developed sinus tachycardia acute hypoxic respiratory failure and acute encephalopathy likely due to hospital delirium, hospitalist consulted for postop medical management  Subjective:  C/o chronic headache, reports h/o cervical disc disease, reports taking NSAIDs for this daily that might cause the ulcer He is aaox3 today + flatus He denies short of breath, no cough, no fever, no chest pain, no edema  Assessment & Plan:   Active Problems:   Pneumoperitoneum   Bowel perforation (HCC)  Acute hypoxic respiratory failure , likely due to atelectasis -Cxr "cardiomegaly with small left effusion and subsegmental atelectasis at the bases" -Ddimer elevated -Venous doppler suboptimal study no dvt -Echo pending, though patient does not appear all volume overloaded -Consider CTA if hypoxia does not improve , he appears improving with less oxygen requirement, tachycardia has resolved -Encourage incentive spirometer  Tachycardia, resolved on iv lopressor  Hypertension On Coreg, Norvasc, hydralazine, Cozaar , Cardura at home Currently n.p.o., on IV Lopressor, prn hydralazine  AKI Cr improved  Repeat BMP, renal dosing meds  Perforated duodenal ulcer status post repair Currently on IV Protonix, NG suction, IV Zosyn, TPN Management per general surgery  Chronic headache, currently on scheduled iv tylenol, robaxin, prn morphin   Nutritional Assessment:  The patient's BMI is: Body mass index is 28.9 kg/m.Marland Kitchen  Seen by  dietician.  I agree with the assessment and plan as outlined below:  Nutrition Status: Nutrition Problem: Inadequate oral intake Etiology: acute illness Signs/Symptoms: NPO status    .    Unresulted Labs (From admission, onward)     Start     Ordered   05/08/21 0500  Creatinine, serum  (enoxaparin (LOVENOX)    CrCl >/= 30 ml/min)  Weekly,   TIMED     Comments: while on enoxaparin therapy    05/01/21 2125   05/07/21 0500  Triglycerides  (TPN Lab Panel)  Every Monday (0500),   TIMED      05/02/21 1147   05/07/21 0500  Comprehensive metabolic panel  (TPN Lab Panel)  Every Mon,Thu (0500),   TIMED      05/03/21 0947   05/07/21 0500  Magnesium  (TPN Lab Panel)  Every Mon,Thu (0500),   TIMED      05/03/21 0947   05/03/21 0500  Phosphorus  (TPN Lab Panel)  Every Mon,Thu (0500),   TIMED      05/02/21 1147              DVT prophylaxis: enoxaparin (LOVENOX) injection 40 mg Start: 05/02/21 1000 SCDs Start: 05/01/21 2126   Code Status: Full Family Communication: Patient Disposition:   Status is: Inpatient   Dispo: The patient is from: Home              Anticipated d/c is to: Home              Anticipated d/c date is: To be determined                 Consultants:  General surgery primary Tried hospitalist as medical consult  Procedures:  Perforated  duodenal ulcer repair  Antimicrobials:   Anti-infectives (From admission, onward)    Start     Dose/Rate Route Frequency Ordered Stop   05/01/21 2200  piperacillin-tazobactam (ZOSYN) IVPB 3.375 g        3.375 g 12.5 mL/hr over 240 Minutes Intravenous Every 8 hours 05/01/21 2125 05/08/21 2159   05/01/21 1530  piperacillin-tazobactam (ZOSYN) IVPB 3.375 g  Status:  Discontinued        3.375 g 12.5 mL/hr over 240 Minutes Intravenous Every 8 hours 05/01/21 1521 05/01/21 2125   05/01/21 1445  cefTRIAXone (ROCEPHIN) 1 g in sodium chloride 0.9 % 100 mL IVPB  Status:  Discontinued        1 g 200 mL/hr over 30 Minutes  Intravenous  Once 05/01/21 1432 05/01/21 1521   05/01/21 1445  metroNIDAZOLE (FLAGYL) IVPB 500 mg  Status:  Discontinued        500 mg 100 mL/hr over 60 Minutes Intravenous  Once 05/01/21 1432 05/01/21 1521           Objective: Vitals:   05/05/21 0252 05/05/21 0542 05/05/21 0755 05/05/21 0900  BP:  (!) 159/94 (!) 175/73 (!) 165/68  Pulse:  73 71 67  Resp:  20 20   Temp:  98.8 F (37.1 C) 98 F (36.7 C)   TempSrc:   Oral   SpO2:  94% 94%   Weight: 83.7 kg     Height:        Intake/Output Summary (Last 24 hours) at 05/05/2021 1014 Last data filed at 05/05/2021 0846 Gross per 24 hour  Intake 2050.99 ml  Output 1410 ml  Net 640.99 ml   Filed Weights   05/02/21 1120 05/04/21 0337 05/05/21 0252  Weight: 93.4 kg 85.2 kg 83.7 kg    Examination:  General exam: calm, NAD, NG placed Respiratory system: Diminished at bases, no wheezing, no rales, no rhonchi, respiratory effort normal. Cardiovascular system: S1 & S2 heard, RRR. No JVD, no murmur, No pedal edema. Gastrointestinal system: Postop changes , hypoactive bowel sounds . Central nervous system: Alert and oriented. No focal neurological deficits. Extremities: Symmetric 5 x 5 power. Skin: No rashes, lesions or ulcers Psychiatry: Judgement and insight appear normal. Mood & affect appropriate.     Data Reviewed: I have personally reviewed following labs and imaging studies  CBC: Recent Labs  Lab 05/01/21 1328 05/02/21 0746 05/03/21 0459 05/05/21 0350  WBC 6.9 9.3 9.1 7.5  HGB 13.5 12.3* 10.9* 10.7*  HCT 40.3 37.0* 34.0* 32.5*  MCV 86.7 88.1 89.5 87.1  PLT 292 217 225 [id number]    Basic Metabolic Panel: Recent Labs  Lab 05/01/21 1328 05/02/21 0746 05/03/21 0459 05/04/21 0505 05/05/21 0350  NA 140 142 142 139 140  K 4.2 4.1 4.3 3.7 3.7  CL 107 112* 113* 108 107  CO2 21* '22 24 28 27  '$ GLUCOSE 162* 145* 115* 110* 86  BUN 34* 34* 32* 23 27*  CREATININE 1.90* 1.39* 1.18 0.98 1.09  CALCIUM 9.3 8.4* 8.5* 8.3*  8.7*  MG  --  1.7 1.9 1.8 1.9  PHOS  --  3.7 3.1 2.1* 2.6    GFR: Estimated Creatinine Clearance: 61.5 mL/min (by C-G formula based on SCr of 1.09 mg/dL).  Liver Function Tests: Recent Labs  Lab 05/01/21 1328 05/02/21 0746 05/03/21 0459 05/04/21 0505 05/05/21 0350  AST 21 90* 41  --  17  ALT 10 97* 63*  --  29  ALKPHOS 54 36* 31*  --  35*  BILITOT 0.7 0.4 0.5  --  0.5  PROT 7.1 6.2* 6.1*  --  6.4*  ALBUMIN 3.7 3.2* 3.1* 2.9* 2.7*    CBG: Recent Labs  Lab 05/04/21 0802 05/04/21 1151 05/04/21 1715 05/04/21 2335 05/05/21 0542  GLUCAP 115* 112* 129* 128* 130*     Recent Results (from the past 240 hour(s))  Resp Panel by RT-PCR (Flu A&B, Covid) Nasopharyngeal Swab     Status: None   Collection Time: 05/01/21  2:39 PM   Specimen: Nasopharyngeal Swab; Nasopharyngeal(NP) swabs in vial transport medium  Result Value Ref Range Status   SARS Coronavirus 2 by RT PCR NEGATIVE NEGATIVE Final    Comment: (NOTE) SARS-CoV-2 target nucleic acids are NOT DETECTED.  The SARS-CoV-2 RNA is generally detectable in upper respiratory specimens during the acute phase of infection. The lowest concentration of SARS-CoV-2 viral copies this assay can detect is 138 copies/mL. A negative result does not preclude SARS-Cov-2 infection and should not be used as the sole basis for treatment or other patient management decisions. A negative result may occur with  improper specimen collection/handling, submission of specimen other than nasopharyngeal swab, presence of viral mutation(s) within the areas targeted by this assay, and inadequate number of viral copies(<138 copies/mL). A negative result must be combined with clinical observations, patient history, and epidemiological information. The expected result is Negative.  Fact Sheet for Patients:  EntrepreneurPulse.com.au  Fact Sheet for Healthcare Providers:  IncredibleEmployment.be  This test is no t  yet approved or cleared by the [place] FDA and  has been authorized for detection and/or diagnosis of SARS-CoV-2 by FDA under an Emergency Use Authorization (EUA). This EUA will remain  in effect (meaning this test can be used) for the duration of the COVID-19 declaration under Section 564(b)(1) of the Act, 21 U.S.C.section 360bbb-3(b)(1), unless the authorization is terminated  or revoked sooner.       Influenza A by PCR NEGATIVE NEGATIVE Final   Influenza B by PCR NEGATIVE NEGATIVE Final    Comment: (NOTE) The Xpert Xpress SARS-CoV-2/FLU/RSV plus assay is intended as an aid in the diagnosis of influenza from Nasopharyngeal swab specimens and should not be used as a sole basis for treatment. Nasal washings and aspirates are unacceptable for Xpert Xpress SARS-CoV-2/FLU/RSV testing.  Fact Sheet for Patients: EntrepreneurPulse.com.au  Fact Sheet for Healthcare Providers: IncredibleEmployment.be  This test is not yet approved or cleared by the [place] FDA and has been authorized for detection and/or diagnosis of SARS-CoV-2 by FDA under an Emergency Use Authorization (EUA). This EUA will remain in effect (meaning this test can be used) for the duration of the COVID-19 declaration under Section 564(b)(1) of the Act, 21 U.S.C. section 360bbb-3(b)(1), unless the authorization is terminated or revoked.  Performed at [facility], [address] [place], [place] [address]   MRSA Next Gen by PCR, Nasal     Status: None   Collection Time: 05/01/21 10:14 PM   Specimen: Nasal Mucosa; Nasal Swab  Result Value Ref Range Status   MRSA by PCR Next Gen NOT DETECTED NOT DETECTED Final    Comment: (NOTE) The GeneXpert MRSA Assay (FDA approved for NASAL specimens only), is one component of a comprehensive MRSA colonization surveillance program. It is not intended to diagnose MRSA infection nor to guide or monitor treatment for MRSA  infections. Test performance is not FDA approved in patients less than 35 years old. Performed at [facility], [address] [place], [place] [address]  Radiology Studies: US Venous Img Lower Bilateral (DVT)  Result Date: 05/04/2021 CLINICAL DATA:  Dyspnea.  History of tobacco abuse. EXAM: BILATERAL LOWER EXTREMITY VENOUS DOPPLER ULTRASOUND TECHNIQUE: Gray-scale sonography with compression, as well as color and duplex ultrasound, were performed to evaluate the deep venous system(s) from the level of the common femoral vein through the popliteal and proximal calf veins. COMPARISON:  11/25/2011 FINDINGS: VENOUS Normal compressibility of the common femoral, superficial femoral, and popliteal veins, as well as the visualized calf veins. Visualized portions of profunda femoral vein and great saphenous vein unremarkable. No filling defects to suggest DVT on grayscale or color Doppler imaging. Doppler waveforms show normal direction of venous flow, normal respiratory phasicity and response to augmentation. Limited views of the contralateral common femoral vein are unremarkable. OTHER None. Limitations: none IMPRESSION: No femoropopliteal DVT nor evidence of DVT within the visualized calf veins. If clinical symptoms are inconsistent or if there are persistent or worsening symptoms, further imaging (possibly involving the iliac veins) may be warranted. Electronically Signed   By: Lucrezia Europe M.D.   On: 05/04/2021 07:29   DG CHEST PORT 1 VIEW  Result Date: 05/03/2021 CLINICAL DATA:  Shortness of breath EXAM: PORTABLE CHEST 1 VIEW COMPARISON:  05/02/2021, 05/01/2021 FINDINGS: Esophageal tube tip overlies stomach, side-port in the region of GE junction. Cardiomegaly with subsegmental atelectasis. Small left-sided pleural effusion. Right upper extremity central venous catheter tip over the SVC. Aortic atherosclerosis. IMPRESSION: 1. Esophageal tube side-port in the region of GE junction,  consider further advancement for more optimal positioning 2. Cardiomegaly with small left effusion and subsegmental atelectasis at the bases Electronically Signed   By: Donavan Foil M.D.   On: 05/03/2021 19:53        Scheduled Meds:  Chlorhexidine Gluconate Cloth  6 each Topical Q0600   enoxaparin (LOVENOX) injection  40 mg Subcutaneous Q24H   insulin aspart  0-9 Units Subcutaneous Q6H   lidocaine  1 patch Transdermal Q24H   metoprolol tartrate  5 mg Intravenous Q6H    morphine injection  2 mg Intravenous Q4H   pantoprazole (PROTONIX) IV  40 mg Intravenous Q12H   sodium chloride flush  10-40 mL Intracatheter Q12H   Continuous Infusions:  acetaminophen 1,000 mg (05/05/21 0555)   methocarbamol (ROBAXIN) IV Stopped (05/05/21 0209)   piperacillin-tazobactam (ZOSYN)  IV 3.375 g (05/05/21 0348)   TPN ADULT (ION) Stopped (05/05/21 0326)     LOS: 4 days   Time spent: 25 mins Greater than 50% of this time was spent in counseling, explanation of diagnosis, planning of further management, and coordination of care.   Voice Recognition Viviann Spare dictation system was used to create this note, attempts have been made to correct errors. Please contact the author with questions and/or clarifications.   Florencia Reasons, MD PhD FACP [facility]  Available via Epic secure chat 7am-7pm for nonurgent issues Please page for urgent issues To page the attending provider between 7A-7P or the covering provider during after hours 7P-7A, please log into the web site [email or web address] and access using universal  password for that web site. If you do not have the password, please call the hospital operator.    05/05/2021, 10:14 AM

## 2021-05-06 ENCOUNTER — Inpatient Hospital Stay: Payer: Medicare Other

## 2021-05-06 LAB — GLUCOSE, CAPILLARY
Glucose-Capillary: 119 mg/dL — ABNORMAL HIGH (ref 70–99)
Glucose-Capillary: 127 mg/dL — ABNORMAL HIGH (ref 70–99)
Glucose-Capillary: 147 mg/dL — ABNORMAL HIGH (ref 70–99)
Glucose-Capillary: 150 mg/dL — ABNORMAL HIGH (ref 70–99)
Glucose-Capillary: 163 mg/dL — ABNORMAL HIGH (ref 70–99)

## 2021-05-06 MED ORDER — TRAVASOL 10 % IV SOLN
INTRAVENOUS | Status: AC
  Filled 2021-05-06: qty 1141.4

## 2021-05-06 MED ORDER — ACETAMINOPHEN 10 MG/ML IV SOLN
1000.0000 mg | Freq: Four times a day (QID) | INTRAVENOUS | Status: AC
Start: 2021-05-06 — End: 2021-05-07
  Administered 2021-05-06 – 2021-05-07 (×4): 1000 mg via INTRAVENOUS
  Filled 2021-05-06 (×4): qty 100

## 2021-05-06 NOTE — Progress Notes (Signed)
Patient to MRI in stable condition

## 2021-05-06 NOTE — Progress Notes (Signed)
Patient ID: Carl [name], male   DOB: 1946/05/25, 75 y.o.   MRN: [id number]     [place] [facility] Day(s): 5.   Interval History: Patient seen and examined, no acute events or new complaints overnight. Patient reports continued complaining of posterior headache and neck pain.  Long discussion with the patient that this posterior headache and neck pain has been going for more than a year.  He has been evaluated by neurology and neurosurgery regarding the posterior headache and neck pain.  Multiple pharmacologic and nonpharmacologic treatment has been tried without any improvement of the pain.  Patient has been taking Tylenol and Excedrin for his back headache and neck pain without significant relief.  This is the only patient complaint during the admission.  No other pain radiation.  No alleviating or aggravating factors.  Denies any nausea or vomiting.  Vital signs in last 24 hours: [min-max] current  Temp:  [97.5 F (36.4 C)-98.9 F (37.2 C)] 97.9 F (36.6 C) (09/04 0726) Pulse Rate:  [62-76] 72 (09/04 0726) Resp:  [15-22] 22 (09/04 0726) BP: (157-183)/(68-103) 157/95 (09/04 0726) SpO2:  [96 %-99 %] 97 % (09/04 0726) Weight:  [83.8 kg] 83.8 kg (09/04 0430)     Height: '5\' 7"'$  (170.2 cm) Weight: 83.8 kg BMI (Calculated): 28.93   Physical Exam:  Constitutional: alert, cooperative and no distress  Respiratory: breathing non-labored at rest  Cardiovascular: regular rate and sinus rhythm  Gastrointestinal: soft, non-tender, and non-distended. wounds are dry and clean.  Drain serous.  Labs:  CBC Latest Ref Rng & Units 05/05/2021 05/03/2021 05/02/2021  WBC 4.0 - 10.5 K/uL 7.5 9.1 9.3  Hemoglobin 13.0 - 17.0 g/dL 10.7(L) 10.9(L) 12.3(L)  Hematocrit 39.0 - 52.0 % 32.5(L) 34.0(L) 37.0(L)  Platelets 150 - 400 K/uL 207 225 217   CMP Latest Ref Rng & Units 05/05/2021 05/04/2021 05/03/2021  Glucose 70 - 99 mg/dL 86 110(H) 115(H)  BUN 8 - 23 mg/dL 27(H) 23 32(H)  Creatinine 0.61 - 1.24 mg/dL 1.09  0.98 1.18  Sodium 135 - 145 mmol/L 140 139 142  Potassium 3.5 - 5.1 mmol/L 3.7 3.7 4.3  Chloride 98 - 111 mmol/L 107 108 113(H)  CO2 22 - 32 mmol/L '27 28 24  '$ Calcium 8.9 - 10.3 mg/dL 8.7(L) 8.3(L) 8.5(L)  Total Protein 6.5 - 8.1 g/dL 6.4(L) - 6.1(L)  Total Bilirubin 0.3 - 1.2 mg/dL 0.5 - 0.5  Alkaline Phos 38 - 126 U/L 35(L) - 31(L)  AST 15 - 41 U/L 17 - 41  ALT 0 - 44 U/L 29 - 63(H)    Imaging studies: No new pertinent imaging studies   Assessment/Plan:  75 y.o. male with peptic ulcer disease with perforation 5 Days Post-Op s/p robotic assisted laparoscopic repair of duodenal ulcer and Graham patch.  From surgical standpoint the patient is recovering adequately.  There has not been any clinical deterioration.  There is stable vital signs.  No fever, stable white blood cell count.  Drain is serous.  We will continue with plan of n.p.o., TPN, nasogastric tube drainage, high-dose PPIs and upper GI on Tuesday.  After discussion with patient and the family regarding his headache and neck pain since this has been for years without any change during the admission, he has been evaluated by multiple experts without any improvement of this pain I cannot foresee anything that can be done during this admission to change his headache.  We will get a continue with IV Tylenol.  We have  to avoid NSAIDs due to PUD with ulcers.  Arnold Long, MD

## 2021-05-06 NOTE — Progress Notes (Signed)
PROGRESS NOTE    Carl [name]  [id number] DOB: 1946-06-15 DOA: 05/01/2021 PCP: [facility]    Chief Complaint  Patient presents with   Abdominal Pain    Brief Narrative:  H/o former smoker and alcoholism, history of gout , gastric ulcer ,hypertension, CVA with mild cognitive impairment admitted to surgery service due to perforated duodenal ulcer, pneumoperitoneum status post laparoscopic repair of perforated duodenal ulcer on 8/30, postop patient developed sinus tachycardia acute hypoxic respiratory failure and acute encephalopathy likely due to hospital delirium, hospitalist consulted for postop medical management  Subjective:  Reports progressive worsening of chronic headache/neck pain, reports h/o cervical disc disease, reports taking NSAIDs for this daily that might cause the ulcer He is aaox3 today + flatus He denies short of breath, no cough, no fever, no chest pain, no edema  Assessment & Plan:   Active Problems:   Pneumoperitoneum   Bowel perforation (HCC)  Acute hypoxic respiratory failure , likely due to atelectasis -Cxr "cardiomegaly with small left effusion and subsegmental atelectasis at the bases" -Ddimer elevated -Venous doppler suboptimal study no dvt -Echo no acute findings,  patient does not appear all volume overloaded -Consider CTA if hypoxia does not improve , he appears improving with less oxygen requirement, tachycardia has resolved -Encourage incentive spirometer, wean oxygen  Tachycardia, resolved on iv lopressor  Hypertension On Coreg, Norvasc, hydralazine, Cozaar , Cardura at home Currently n.p.o., on IV Lopressor, prn hydralazine  AKI Cr improved  Repeat BMP, renal dosing meds  Perforated duodenal ulcer status post repair Currently on IV Protonix, NG suction, IV Zosyn, TPN Management per general surgery  Chronic headache/neck pain, currently on scheduled iv tylenol, robaxin, prn morphin Reports progressive worsening of  symptom, followed by neurosurgery and pain clinic Will get mri brain and c spine   Nutritional Assessment:  The patient's BMI is: Body mass index is 28.94 kg/m.Marland Kitchen  Seen by dietician.  I agree with the assessment and plan as outlined below:  Nutrition Status: Nutrition Problem: Inadequate oral intake Etiology: acute illness Signs/Symptoms: NPO status    .    Unresulted Labs (From admission, onward)     Start     Ordered   05/08/21 0500  Creatinine, serum  (enoxaparin (LOVENOX)    CrCl >/= 30 ml/min)  Weekly,   TIMED     Comments: while on enoxaparin therapy    05/01/21 2125   05/07/21 0500  Triglycerides  ([facility] Lab Panel)  Every Monday (0500),   TIMED      05/02/21 1147   05/07/21 0500  Comprehensive metabolic panel  ([facility] Lab Panel)  Every Mon,Thu (0500),   TIMED      05/03/21 0947   05/07/21 0500  Magnesium  ([facility] Lab Panel)  Every Mon,Thu (0500),   TIMED      05/03/21 0947   05/03/21 0500  Phosphorus  ([facility] Lab Panel)  Every Mon,Thu (0500),   TIMED      05/02/21 1147              DVT prophylaxis: enoxaparin (LOVENOX) injection 40 mg Start: 05/02/21 1000 SCDs Start: 05/01/21 2126   Code Status: Full Family Communication: Patient Disposition:   Status is: Inpatient   Dispo: The patient is from: Home              Anticipated d/c is to: Home              Anticipated d/c date is: To be determined  Consultants:  General surgery primary Tried hospitalist as medical consult  Procedures:  Perforated duodenal ulcer repair  Antimicrobials:   Anti-infectives (From admission, onward)    Start     Dose/Rate Route Frequency Ordered Stop   05/01/21 2200  piperacillin-tazobactam (ZOSYN) IVPB 3.375 g        3.375 g 12.5 mL/hr over 240 Minutes Intravenous Every 8 hours 05/01/21 2125 05/08/21 2159   05/01/21 1530  piperacillin-tazobactam (ZOSYN) IVPB 3.375 g  Status:  Discontinued        3.375 g 12.5 mL/hr over 240 Minutes Intravenous Every 8 hours  05/01/21 1521 05/01/21 2125   05/01/21 1445  cefTRIAXone (ROCEPHIN) 1 g in sodium chloride 0.9 % 100 mL IVPB  Status:  Discontinued        1 g 200 mL/hr over 30 Minutes Intravenous  Once 05/01/21 1432 05/01/21 1521   05/01/21 1445  metroNIDAZOLE (FLAGYL) IVPB 500 mg  Status:  Discontinued        500 mg 100 mL/hr over 60 Minutes Intravenous  Once 05/01/21 1432 05/01/21 1521           Objective: Vitals:   05/06/21 0430 05/06/21 0456 05/06/21 0726 05/06/21 1113  BP:   (!) 157/95 (!) 148/91  Pulse:   72 73  Resp:  19 (!) 22 18  Temp:   97.9 F (36.6 C) 97.8 F (36.6 C)  TempSrc:      SpO2:   97% 97%  Weight: 83.8 kg     Height:        Intake/Output Summary (Last 24 hours) at 05/06/2021 1551 Last data filed at 05/06/2021 [id number] Gross per 24 hour  Intake 1171.5 ml  Output 2405 ml  Net -1233.5 ml   Filed Weights   05/04/21 0337 05/05/21 0252 05/06/21 0430  Weight: 85.2 kg 83.7 kg 83.8 kg    Examination:  General exam: calm, NAD, NG placed Respiratory system: Diminished at bases, no wheezing, no rales, no rhonchi, respiratory effort normal. Cardiovascular system: S1 & S2 heard, RRR. No JVD, no murmur, No pedal edema. Gastrointestinal system: Postop changes , hypoactive bowel sounds . Central nervous system: Alert and oriented. No focal neurological deficits. Extremities: no edema Skin: No rashes, lesions or ulcers Psychiatry: Judgement and insight appear normal. Mood & affect appropriate.     Data Reviewed: I have personally reviewed following labs and imaging studies  CBC: Recent Labs  Lab 05/01/21 1328 05/02/21 0746 05/03/21 0459 05/05/21 0350  WBC 6.9 9.3 9.1 7.5  HGB 13.5 12.3* 10.9* 10.7*  HCT 40.3 37.0* 34.0* 32.5*  MCV 86.7 88.1 89.5 87.1  PLT 292 217 225 [id number]    Basic Metabolic Panel: Recent Labs  Lab 05/01/21 1328 05/02/21 0746 05/03/21 0459 05/04/21 0505 05/05/21 0350  NA 140 142 142 139 140  K 4.2 4.1 4.3 3.7 3.7  CL 107 112* 113* 108 107   CO2 21* '22 24 28 27  '$ GLUCOSE 162* 145* 115* 110* 86  BUN 34* 34* 32* 23 27*  CREATININE 1.90* 1.39* 1.18 0.98 1.09  CALCIUM 9.3 8.4* 8.5* 8.3* 8.7*  MG  --  1.7 1.9 1.8 1.9  PHOS  --  3.7 3.1 2.1* 2.6    GFR: Estimated Creatinine Clearance: 61.6 mL/min (by C-G formula based on SCr of 1.09 mg/dL).  Liver Function Tests: Recent Labs  Lab 05/01/21 1328 05/02/21 0746 05/03/21 0459 05/04/21 0505 05/05/21 0350  AST 21 90* 41  --  17  ALT 10 97* 63*  --  29  ALKPHOS 54 36* 31*  --  35*  BILITOT 0.7 0.4 0.5  --  0.5  PROT 7.1 6.2* 6.1*  --  6.4*  ALBUMIN 3.7 3.2* 3.1* 2.9* 2.7*    CBG: Recent Labs  Lab 05/05/21 0542 05/06/21 0021 05/06/21 0132 05/06/21 0429 05/06/21 1114  GLUCAP 130* 147* 127* 150* 163*     Recent Results (from the past 240 hour(s))  Resp Panel by RT-PCR (Flu A&B, Covid) Nasopharyngeal Swab     Status: None   Collection Time: 05/01/21  2:39 PM   Specimen: Nasopharyngeal Swab; Nasopharyngeal(NP) swabs in vial transport medium  Result Value Ref Range Status   SARS Coronavirus 2 by RT PCR NEGATIVE NEGATIVE Final    Comment: (NOTE) SARS-CoV-2 target nucleic acids are NOT DETECTED.  The SARS-CoV-2 RNA is generally detectable in upper respiratory specimens during the acute phase of infection. The lowest concentration of SARS-CoV-2 viral copies this assay can detect is 138 copies/mL. A negative result does not preclude SARS-Cov-2 infection and should not be used as the sole basis for treatment or other patient management decisions. A negative result may occur with  improper specimen collection/handling, submission of specimen other than nasopharyngeal swab, presence of viral mutation(s) within the areas targeted by this assay, and inadequate number of viral copies(<138 copies/mL). A negative result must be combined with clinical observations, patient history, and epidemiological information. The expected result is Negative.  Fact Sheet for Patients:   EntrepreneurPulse.com.au  Fact Sheet for Healthcare Providers:  IncredibleEmployment.be  This test is no t yet approved or cleared by the [place] FDA and  has been authorized for detection and/or diagnosis of SARS-CoV-2 by FDA under an Emergency Use Authorization (EUA). This EUA will remain  in effect (meaning this test can be used) for the duration of the COVID-19 declaration under Section 564(b)(1) of the Act, 21 U.S.C.section 360bbb-3(b)(1), unless the authorization is terminated  or revoked sooner.       Influenza A by PCR NEGATIVE NEGATIVE Final   Influenza B by PCR NEGATIVE NEGATIVE Final    Comment: (NOTE) The Xpert Xpress SARS-CoV-2/FLU/RSV plus assay is intended as an aid in the diagnosis of influenza from Nasopharyngeal swab specimens and should not be used as a sole basis for treatment. Nasal washings and aspirates are unacceptable for Xpert Xpress SARS-CoV-2/FLU/RSV testing.  Fact Sheet for Patients: EntrepreneurPulse.com.au  Fact Sheet for Healthcare Providers: IncredibleEmployment.be  This test is not yet approved or cleared by the [place] FDA and has been authorized for detection and/or diagnosis of SARS-CoV-2 by FDA under an Emergency Use Authorization (EUA). This EUA will remain in effect (meaning this test can be used) for the duration of the COVID-19 declaration under Section 564(b)(1) of the Act, 21 U.S.C. section 360bbb-3(b)(1), unless the authorization is terminated or revoked.  Performed at [facility], [address] [place], [place] [address]   MRSA Next Gen by PCR, Nasal     Status: None   Collection Time: 05/01/21 10:14 PM   Specimen: Nasal Mucosa; Nasal Swab  Result Value Ref Range Status   MRSA by PCR Next Gen NOT DETECTED NOT DETECTED Final    Comment: (NOTE) The GeneXpert MRSA Assay (FDA approved for NASAL specimens only), is one component of a  comprehensive MRSA colonization surveillance program. It is not intended to diagnose MRSA infection nor to guide or monitor treatment for MRSA infections. Test performance is not FDA approved in patients less than 59 years old. Performed at [facility], [address] [place], [place] [address]          Radiology Studies: ECHOCARDIOGRAM COMPLETE  Result Date: 05/05/2021    ECHOCARDIOGRAM REPORT   Patient Name:   STEVIE POISSON Blow Date of Exam: 05/05/2021 Medical Rec #:  [id number]    Height:       67.0 in Accession #:    [id number]   Weight:       184.5 lb Date of Birth:  March 01, 1946     BSA:          1.954 m Patient Age:    44 years     BP:           162/87 mmHg Patient Gender: M            HR:           58 bpm. Exam Location:  [facility] Procedure: 2D Echo and Strain Analysis Indications:     CHF I50.9  History:         Patient has no prior history of Echocardiogram examinations.  Sonographer:     Kathlen Brunswick RDCS Referring Phys:  [id number] [place] Latka Diagnosing Phys: Kate Sable MD  Sonographer Comments: Image acquisition challenging due to respiratory motion. Global longitudinal strain was attempted. IMPRESSIONS  1. Left ventricular ejection fraction, by estimation, is 60 to 65%. The left ventricle has normal function. The left ventricle has no regional wall motion abnormalities. There is mild left ventricular hypertrophy. Left ventricular diastolic parameters are consistent with Grade I diastolic dysfunction (impaired relaxation). The average left ventricular global longitudinal strain is -16.0 %.  2. Right ventricular systolic function is normal. The right ventricular size is not well visualized.  3. The mitral valve is normal in structure. No evidence of mitral valve regurgitation.  4. The aortic valve is tricuspid. Aortic valve regurgitation is not visualized.  5. The inferior vena cava is dilated in size with >50% respiratory variability, suggesting right atrial pressure of 8 mmHg. FINDINGS   Left Ventricle: Left ventricular ejection fraction, by estimation, is 60 to 65%. The left ventricle has normal function. The left ventricle has no regional wall motion abnormalities. The average left ventricular global longitudinal strain is -16.0 %. The left ventricular internal cavity size was normal in size. There is mild left ventricular hypertrophy. Left ventricular diastolic parameters are consistent with Grade I diastolic dysfunction (impaired relaxation). Right Ventricle: The right ventricular size is not well visualized. No increase in right ventricular wall thickness. Right ventricular systolic function is normal. Left Atrium: Left atrial size was normal in size. Right Atrium: Right atrial size was normal in size. Pericardium: Trivial pericardial effusion is present. Mitral Valve: The mitral valve is normal in structure. No evidence of mitral valve regurgitation. Tricuspid Valve: The tricuspid valve is normal in structure. Tricuspid valve regurgitation is mild. Aortic Valve: The aortic valve is tricuspid. Aortic valve regurgitation is not visualized. Aortic valve peak gradient measures 10.9 mmHg. Pulmonic Valve: The pulmonic valve was not well visualized. Pulmonic valve regurgitation is mild. Aorta: The aortic root and ascending aorta are structurally normal, with no evidence of dilitation. Venous: The inferior vena cava is dilated in size with greater than 50% respiratory variability, suggesting right atrial pressure of 8 mmHg. IAS/Shunts: No atrial level shunt detected by color flow Doppler.  LEFT VENTRICLE PLAX 2D LVIDd:         3.90 cm  Diastology LVIDs:         2.70 cm  LV e'  medial:    4.35 cm/s LV PW:         1.50 cm  LV E/e' medial:  12.3 LV IVS:        1.60 cm  LV e' lateral:   5.77 cm/s LVOT diam:     2.00 cm  LV E/e' lateral: 9.3 LV SV:         78 LV SV Index:   40       2D Longitudinal Strain LVOT Area:     3.14 cm 2D Strain GLS Avg:     -16.0 %  RIGHT VENTRICLE RV Basal diam:  2.90 cm RV S  prime:     20.80 cm/s TAPSE (M-mode): 1.8 cm LEFT ATRIUM           Index       RIGHT ATRIUM           Index LA diam:      3.50 cm 1.79 cm/m  RA Area:     15.10 cm LA Vol (A4C): 49.4 ml 25.28 ml/m RA Volume:   34.60 ml  17.71 ml/m  AORTIC VALVE                PULMONIC VALVE AV Area (Vmax): 2.25 cm    PV Vmax:       1.21 m/s AV Vmax:        165.00 cm/s PV Peak grad:  5.9 mmHg AV Peak Grad:   10.9 mmHg LVOT Vmax:      118.00 cm/s LVOT Vmean:     71.900 cm/s LVOT VTI:       0.247 m  AORTA Ao Root diam: 3.10 cm Ao Asc diam:  3.50 cm MITRAL VALVE               TRICUSPID VALVE MV Area (PHT): 2.16 cm    TV Peak grad:   29.7 mmHg MV Decel Time: 351 msec    TV Vmax:        2.72 m/s MV E velocity: 53.60 cm/s MV A velocity: 87.40 cm/s  SHUNTS MV E/A ratio:  0.61        Systemic VTI:  0.25 m                            Systemic Diam: 2.00 cm Kate Sable MD Electronically signed by Kate Sable MD Signature Date/Time: 05/05/2021/3:28:22 PM    Final         Scheduled Meds:  Chlorhexidine Gluconate Cloth  6 each Topical Q0600   enoxaparin (LOVENOX) injection  40 mg Subcutaneous Q24H   insulin aspart  0-9 Units Subcutaneous Q6H   lidocaine  1 patch Transdermal Q24H   metoprolol tartrate  5 mg Intravenous Q6H   pantoprazole (PROTONIX) IV  40 mg Intravenous Q12H   sodium chloride flush  10-40 mL Intracatheter Q12H   Continuous Infusions:  acetaminophen 1,000 mg (05/06/21 1321)   methocarbamol (ROBAXIN) IV 500 mg (05/06/21 0832)   piperacillin-tazobactam (ZOSYN)  IV 3.375 g (05/06/21 0505)   TPN ADULT (ION) 82 mL/hr at 05/05/21 1746   TPN ADULT (ION)       LOS: 5 days   Time spent: 25 mins, case discussed with general surgery through secure chat  Greater than 50% of this time was spent in counseling, explanation of diagnosis, planning of further management, and coordination of care.   Voice Recognition Viviann Spare dictation system was used to create this note, attempts  have been made to correct  errors. Please contact the author with questions and/or clarifications.   Florencia Reasons, MD PhD FACP [facility]  Available via Epic secure chat 7am-7pm for nonurgent issues Please page for urgent issues To page the attending provider between 7A-7P or the covering provider during after hours 7P-7A, please log into the web site [email or web address] and access using universal [place] password for that web site. If you do not have the password, please call the hospital operator.    05/06/2021, 3:51 PM

## 2021-05-06 NOTE — Consult Note (Signed)
PHARMACY - TOTAL PARENTERAL NUTRITION CONSULT NOTE   Indication:  Perforated duodenal ulcer, which will req NPO 5-7 days  Patient Measurements: Height: '5\' 7"'$  (170.2 cm) Weight: 83.8 kg (184 lb 11.9 oz) IBW/kg (Calculated) : 66.1 TPN AdjBW (KG): 72.7 Body mass index is 28.94 kg/m. Usual Weight: 86.6 kg on intake (87.1kg 01/2021)  Assessment: 75 y/o male with h/o gout, HTN, lung nodule, anxiety, depression, etoh abuse, DDD, stroke and PUD who is admitted for perforated duodenal ulcer now s/p robotic assisted laparoscopic repair   Glucose / Insulin: BG 105>119 > 86 > 150 (novolog 2 units)  Electrolytes: Lytes WNL today. Renal: Scr 1.9>1.39>1.18>0.98 > 1.09 Hepatic: ALT 10>97>63; AST 21>90>41; lipase 117; TG 52 Intake / Output; MIVF: Net positive +503.34m GI Imaging:  8/30: Abdominal XR: 1. Large stool burden without evidence of bowel obstruction. 2. No evidence of free air.  8/30: CT abd - duodenum with crescentic fluid and air collection; Given history of peptic ulcer disease, these findings in conjunction with small volume pneumoperitoneum are likely related to a perforated duodenal ulcer. GI Surgeries / Procedures:  8/30: ex-lap for perforation  Central access: 8/31 TPN start date: 8/31  Nutritional Goals: Goal TPN rate is 82 mL/hr (provides 114.1 g of protein and 2050 kcals per day)  RD Assessment: Estimated Needs Total Energy Estimated Needs: 2000-2300kcal/day Total Protein Estimated Needs: 100-115g/day Total Fluid Estimated Needs: 1.7-2.0L/day  Current Nutrition:  NPO  Plan:  Continue TPN to goal 82 mL/hr at 1800 Electrolytes in TPN (Standard): Na 532m/L, K 5036mL, Ca 5mE47m, Mg 5mEq46m and Phos 15mmo10m Cl:Ac 1:1 Add standard MVI and trace elements to TPN Add folic acid. D/c thiamine  Continue sensitive q6h SSI. Adjust as needed (currently at goal '180mg'$ /dL) Monitor TPN labs on Mon/Thurs, including daily for first couple days until goal rate established.  KishanOswald HillockmD Pharmacy Resident  05/06/2021 9:52 AM

## 2021-05-07 LAB — GLUCOSE, CAPILLARY
Glucose-Capillary: 129 mg/dL — ABNORMAL HIGH (ref 70–99)
Glucose-Capillary: 131 mg/dL — ABNORMAL HIGH (ref 70–99)
Glucose-Capillary: 132 mg/dL — ABNORMAL HIGH (ref 70–99)
Glucose-Capillary: 143 mg/dL — ABNORMAL HIGH (ref 70–99)

## 2021-05-07 LAB — COMPREHENSIVE METABOLIC PANEL
ALT: 26 U/L (ref 0–44)
AST: 23 U/L (ref 15–41)
Albumin: 2.6 g/dL — ABNORMAL LOW (ref 3.5–5.0)
Alkaline Phosphatase: 38 U/L (ref 38–126)
Anion gap: 5 (ref 5–15)
BUN: 28 mg/dL — ABNORMAL HIGH (ref 8–23)
CO2: 27 mmol/L (ref 22–32)
Calcium: 8.5 mg/dL — ABNORMAL LOW (ref 8.9–10.3)
Chloride: 105 mmol/L (ref 98–111)
Creatinine, Ser: 1.01 mg/dL (ref 0.61–1.24)
GFR, Estimated: >60 mL/min (ref >60)
Glucose, Bld: 129 mg/dL — ABNORMAL HIGH (ref 70–99)
Potassium: 3.7 mmol/L (ref 3.5–5.1)
Sodium: 137 mmol/L (ref 135–145)
Total Bilirubin: 0.5 mg/dL (ref 0.3–1.2)
Total Protein: 6.1 g/dL — ABNORMAL LOW (ref 6.5–8.1)

## 2021-05-07 LAB — MAGNESIUM: Magnesium: 2 mg/dL (ref 1.7–2.4)

## 2021-05-07 LAB — TRIGLYCERIDES: Triglycerides: 94 mg/dL (ref <150)

## 2021-05-07 LAB — PHOSPHORUS: Phosphorus: 3.6 mg/dL (ref 2.5–4.6)

## 2021-05-07 MED ORDER — TRAVASOL 10 % IV SOLN
INTRAVENOUS | Status: AC
  Filled 2021-05-07: qty 1141.4

## 2021-05-07 MED ORDER — KETOROLAC TROMETHAMINE 15 MG/ML IJ SOLN
15.0000 mg | Freq: Once | INTRAMUSCULAR | Status: AC
  Administered 2021-05-07: 13:00:00 15 mg via INTRAVENOUS
  Filled 2021-05-07: qty 1

## 2021-05-07 MED ORDER — DIPHENHYDRAMINE HCL 50 MG/ML IJ SOLN
25.0000 mg | Freq: Once | INTRAMUSCULAR | Status: AC
  Administered 2021-05-07: 13:00:00 25 mg via INTRAVENOUS
  Filled 2021-05-07: qty 1

## 2021-05-07 MED ORDER — PROCHLORPERAZINE EDISYLATE 10 MG/2ML IJ SOLN
10.0000 mg | Freq: Once | INTRAMUSCULAR | Status: AC
  Administered 2021-05-07: 13:00:00 10 mg via INTRAVENOUS
  Filled 2021-05-07: qty 2

## 2021-05-07 NOTE — Progress Notes (Signed)
Patient ID: Carl [name], male   DOB: 1946-08-28, 75 y.o.   MRN: [id number]     [facility] Day(s): 6.   Interval History: Patient seen and examined, no acute events or new complaints overnight. Patient reports continued posterior headache and neck pain.  This is the only patient complaint during the admission.  No other pain radiation.  No alleviating or aggravating factors.  Denies any nausea or vomiting.  Vital signs in last 24 hours: [min-max] current  Temp:  [97.8 F (36.6 C)-98.2 F (36.8 C)] 97.9 F (36.6 C) (09/05 0356) Pulse Rate:  [55-74] 59 (09/05 0356) Resp:  [16-19] 19 (09/05 0356) BP: (127-160)/(78-91) 127/87 (09/05 0356) SpO2:  [94 %-99 %] 94 % (09/05 0356) Weight:  [83.9 kg] 83.9 kg (09/05 0436)     Height: '5\' 7"'$  (170.2 cm) Weight: 83.9 kg BMI (Calculated): 28.96   Physical Exam:  Constitutional: alert, cooperative and no distress  Respiratory: breathing non-labored at rest  Cardiovascular: regular rate and sinus rhythm  Gastrointestinal: soft, non-tender, and non-distended. wounds are dry and clean.  Drain serous.  Labs:  CBC Latest Ref Rng & Units 05/05/2021 05/03/2021 05/02/2021  WBC 4.0 - 10.5 K/uL 7.5 9.1 9.3  Hemoglobin 13.0 - 17.0 g/dL 10.7(L) 10.9(L) 12.3(L)  Hematocrit 39.0 - 52.0 % 32.5(L) 34.0(L) 37.0(L)  Platelets 150 - 400 K/uL 207 225 217   CMP Latest Ref Rng & Units 05/07/2021 05/05/2021 05/04/2021  Glucose 70 - 99 mg/dL 129(H) 86 110(H)  BUN 8 - 23 mg/dL 28(H) 27(H) 23  Creatinine 0.61 - 1.24 mg/dL 1.01 1.09 0.98  Sodium 135 - 145 mmol/L 137 140 139  Potassium 3.5 - 5.1 mmol/L 3.7 3.7 3.7  Chloride 98 - 111 mmol/L 105 107 108  CO2 22 - 32 mmol/L '27 27 28  '$ Calcium 8.9 - 10.3 mg/dL 8.5(L) 8.7(L) 8.3(L)  Total Protein 6.5 - 8.1 g/dL 6.1(L) 6.4(L) -  Total Bilirubin 0.3 - 1.2 mg/dL 0.5 0.5 -  Alkaline Phos 38 - 126 U/L 38 35(L) -  AST 15 - 41 U/L 23 17 -  ALT 0 - 44 U/L 26 29 -    Imaging studies: No new pertinent imaging  studies   Assessment/Plan:  75 y.o. male with peptic ulcer disease with perforation 6 Days Post-Op s/p robotic assisted laparoscopic repair of duodenal ulcer and Graham patch.  From surgical standpoint the patient is recovering adequately.  There has not been any clinical deterioration.  There is stable vital signs.  No fever, stable white blood cell count.  Drain is serous.  We will continue with plan of n.p.o., TPN, nasogastric tube drainage, high-dose PPIs and upper GI series as ordered for tomorrow.  After discussion with patient and his wife at the bedside regarding his headache and neck pain since this has been for years without any change during the admission, we discussed how nice it would be if his chiropractor who provides the greatest relief would be available to provide inpatient treatment/manipulation.  Although that is not a practical wish apparently at this time, we will get a continue with IV Tylenol.  We have to avoid NSAIDs due to PUD with ulcers.  I am still curious what degree of risk IV NSAIDs are in peptic ulcer disease.  Regardless we will not trial this.  Ronny Bacon, M.D., [facility]  05/07/2021 ; 7:46 AM

## 2021-05-07 NOTE — Progress Notes (Signed)
PROGRESS NOTE    Carl [name]  [id number] DOB: October 13, 1945 DOA: 05/01/2021 PCP: [facility]   Chief Complain: Abdominal pain  Brief Narrative: Patient is a 75 year old male with history of tobacco use, alcoholism, gout, gastric ulcer, hypertension, CVA with mild cognitive impairment who was admitted to surgical service for perforated duodenal ulcer, pneumoperitoneum.  He is status post laparoscopic repair of the perforated duodenal ulcer on 8/30.  Postoperatively he developed sinus tachycardia, acute hypoxic respiratory failure, acute encephalopathy likely from hospital-acquired delirium.  We were consulted for medical management.  Currently hemodynamically stable.  Mental status has improved.  Not tachycardic or tachypneic anymore.  Assessment & Plan:   Active Problems:   Pneumoperitoneum   Bowel perforation (HCC)   Acute hypoxic respiratory failure: Suspected to be from atelectasis.  Chest x-ray cardiomegaly with small left pleural effusion, subsegmental atelectasis at the bases.  D-dimer elevated.  Venous Doppler did not show any DVT.  Echo did not show any acute findings patient is not volume overloaded.  Tachypnea has resolved.  Still on 2 L of oxygen per minute.  Try to wean the oxygen.  Continue incentive spirometer  Tachycardia: Resolved.  Currently on  normal sinus rhythm.  Hypertension: On Coreg, Norvasc, hydralazine, Cozaar, Cardura.  Currently NPO.  Monitor blood pressure with as needed medications.  AKI: Kidney function has improved.  Currently at baseline.  Perforated duodenal ulcer:  He is status post laparoscopic repair of the perforated duodenal ulcer on 8/30..  Management as per general surgery  Chronic headache/neck pain: On Tylenol, Robaxin, as needed pain medicines.  MRI of the brain showed remote hemorrhagic infarct of the left thalamus, MRI of the cervical spine did not show any acute abnormalities, diffuse moderate multiple foraminal narrowing.   Continue supportive care.  Follow-up with neurosurgery as an outpatient. 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  Complains of severe headache today.  We will try migraine cocktail .  Nutrition Problem: Inadequate oral intake Etiology: acute illness       Procedures:As above  Antimicrobials:  Anti-infectives (From admission, onward)    Start     Dose/Rate Route Frequency Ordered Stop   05/01/21 2200  piperacillin-tazobactam (ZOSYN) IVPB 3.375 g        3.375 g 12.5 mL/hr over 240 Minutes Intravenous Every 8 hours 05/01/21 2125 05/08/21 2159   05/01/21 1530  piperacillin-tazobactam (ZOSYN) IVPB 3.375 g  Status:  Discontinued        3.375 g 12.5 mL/hr over 240 Minutes Intravenous Every 8 hours 05/01/21 1521 05/01/21 2125   05/01/21 1445  cefTRIAXone (ROCEPHIN) 1 g in sodium chloride 0.9 % 100 mL IVPB  Status:  Discontinued        1 g 200 mL/hr over 30 Minutes Intravenous  Once 05/01/21 1432 05/01/21 1521   05/01/21 1445  metroNIDAZOLE (FLAGYL) IVPB 500 mg  Status:  Discontinued        500 mg 100 mL/hr over 60 Minutes Intravenous  Once 05/01/21 1432 05/01/21 1521       Subjective: Patient seen and examined at the bedside this morning.  Hemodynamically stable.  Complains of severe headache.  Wife at bedside.  Had a bowel movement today.  Passing flatus.  Denies any abdomen pain, nausea or vomiting.  Objective: Vitals:   05/06/21 2117 05/07/21 0356 05/07/21 0436 05/07/21 0749  BP: 137/81 127/87  (!) 145/91  Pulse: Marland Kitchen)  55 (!) 59  74  Resp: '16 19  18  '$ Temp: 98.2 F (36.8 C) 97.9 F (36.6 C)  98 F (36.7 C)  TempSrc: Oral     SpO2: 99% 94%  95%  Weight:   83.9 kg   Height:        Intake/Output Summary (Last 24 hours) at 05/07/2021 1121 Last data filed at 05/07/2021 [id number] Gross per 24 hour  Intake 2624.95 ml  Output 1860 ml  Net 764.95 ml   Filed Weights   05/05/21 0252 05/06/21 0430  05/07/21 0436  Weight: 83.7 kg 83.8 kg 83.9 kg    Examination:  General exam: Overall comfortable, calm and cooperative HEENT: PERRL Respiratory system:  no wheezes or crackles  Cardiovascular system: S1 & S2 heard, RRR.  Gastrointestinal system: Abdomen is nondistended, soft and nontender.  Surgical wound covered with dressing, drain Central nervous system: Alert and oriented Extremities: No edema, no clubbing ,no cyanosis Skin: No rashes, no ulcers,no icterus       Data Reviewed: I have personally reviewed following labs and imaging studies  CBC: Recent Labs  Lab 05/01/21 1328 05/02/21 0746 05/03/21 0459 05/05/21 0350  WBC 6.9 9.3 9.1 7.5  HGB 13.5 12.3* 10.9* 10.7*  HCT 40.3 37.0* 34.0* 32.5*  MCV 86.7 88.1 89.5 87.1  PLT 292 217 225 [id number]   Basic Metabolic Panel: Recent Labs  Lab 05/02/21 0746 05/03/21 0459 05/04/21 0505 05/05/21 0350 05/07/21 0427  NA 142 142 139 140 137  K 4.1 4.3 3.7 3.7 3.7  CL 112* 113* 108 107 105  CO2 '22 24 28 27 27  '$ GLUCOSE 145* 115* 110* 86 129*  BUN 34* 32* 23 27* 28*  CREATININE 1.39* 1.18 0.98 1.09 1.01  CALCIUM 8.4* 8.5* 8.3* 8.7* 8.5*  MG 1.7 1.9 1.8 1.9 2.0  PHOS 3.7 3.1 2.1* 2.6 3.6   GFR: Estimated Creatinine Clearance: 66.4 mL/min (by C-G formula based on SCr of 1.01 mg/dL). Liver Function Tests: Recent Labs  Lab 05/01/21 1328 05/02/21 0746 05/03/21 0459 05/04/21 0505 05/05/21 0350 05/07/21 0427  AST 21 90* 41  --  17 23  ALT 10 97* 63*  --  29 26  ALKPHOS 54 36* 31*  --  35* 38  BILITOT 0.7 0.4 0.5  --  0.5 0.5  PROT 7.1 6.2* 6.1*  --  6.4* 6.1*  ALBUMIN 3.7 3.2* 3.1* 2.9* 2.7* 2.6*   Recent Labs  Lab 05/01/21 1328  LIPASE 117*   No results for input(s): AMMONIA in the last 168 hours. Coagulation Profile: No results for input(s): INR, PROTIME in the last 168 hours. Cardiac Enzymes: No results for input(s): CKTOTAL, CKMB, CKMBINDEX, TROPONINI in the last 168 hours. BNP (last 3 results) No results for  input(s): PROBNP in the last 8760 hours. HbA1C: No results for input(s): HGBA1C in the last 72 hours. CBG: Recent Labs  Lab 05/06/21 0429 05/06/21 1114 05/06/21 1832 05/07/21 0105 05/07/21 0624  GLUCAP 150* 163* 119* 131* 143*   Lipid Profile: Recent Labs    05/07/21 0427  TRIG 94   Thyroid Function Tests: No results for input(s): TSH, T4TOTAL, FREET4, T3FREE, THYROIDAB in the last 72 hours. Anemia Panel: No results for input(s): VITAMINB12, FOLATE, FERRITIN, TIBC, IRON, RETICCTPCT in the last 72 hours. Sepsis Labs: Recent Labs  Lab 05/01/21 1439  LATICACIDVEN 1.5    Recent Results (from the past 240 hour(s))  Resp Panel by RT-PCR (Flu A&B, Covid) Nasopharyngeal Swab     Status: None  Collection Time: 05/01/21  2:39 PM   Specimen: Nasopharyngeal Swab; Nasopharyngeal(NP) swabs in vial transport medium  Result Value Ref Range Status   SARS Coronavirus 2 by RT PCR NEGATIVE NEGATIVE Final    Comment: (NOTE) SARS-CoV-2 target nucleic acids are NOT DETECTED.  The SARS-CoV-2 RNA is generally detectable in upper respiratory specimens during the acute phase of infection. The lowest concentration of SARS-CoV-2 viral copies this assay can detect is 138 copies/mL. A negative result does not preclude SARS-Cov-2 infection and should not be used as the sole basis for treatment or other patient management decisions. A negative result may occur with  improper specimen collection/handling, submission of specimen other than nasopharyngeal swab, presence of viral mutation(s) within the areas targeted by this assay, and inadequate number of viral copies(<138 copies/mL). A negative result must be combined with clinical observations, patient history, and epidemiological information. The expected result is Negative.  Fact Sheet for Patients:  EntrepreneurPulse.com.au  Fact Sheet for Healthcare Providers:  IncredibleEmployment.be  This test is no t  yet approved or cleared by the [place] FDA and  has been authorized for detection and/or diagnosis of SARS-CoV-2 by FDA under an Emergency Use Authorization (EUA). This EUA will remain  in effect (meaning this test can be used) for the duration of the COVID-19 declaration under Section 564(b)(1) of the Act, 21 U.S.C.section 360bbb-3(b)(1), unless the authorization is terminated  or revoked sooner.       Influenza A by PCR NEGATIVE NEGATIVE Final   Influenza B by PCR NEGATIVE NEGATIVE Final    Comment: (NOTE) The Xpert Xpress SARS-CoV-2/FLU/RSV plus assay is intended as an aid in the diagnosis of influenza from Nasopharyngeal swab specimens and should not be used as a sole basis for treatment. Nasal washings and aspirates are unacceptable for Xpert Xpress SARS-CoV-2/FLU/RSV testing.  Fact Sheet for Patients: EntrepreneurPulse.com.au  Fact Sheet for Healthcare Providers: IncredibleEmployment.be  This test is not yet approved or cleared by the [place] FDA and has been authorized for detection and/or diagnosis of SARS-CoV-2 by FDA under an Emergency Use Authorization (EUA). This EUA will remain in effect (meaning this test can be used) for the duration of the COVID-19 declaration under Section 564(b)(1) of the Act, 21 U.S.C. section 360bbb-3(b)(1), unless the authorization is terminated or revoked.  Performed at [facility], [address] [place], [place] [address]   MRSA Next Gen by PCR, Nasal     Status: None   Collection Time: 05/01/21 10:14 PM   Specimen: Nasal Mucosa; Nasal Swab  Result Value Ref Range Status   MRSA by PCR Next Gen NOT DETECTED NOT DETECTED Final    Comment: (NOTE) The GeneXpert MRSA Assay (FDA approved for NASAL specimens only), is one component of a comprehensive MRSA colonization surveillance program. It is not intended to diagnose MRSA infection nor to guide or monitor treatment for MRSA  infections. Test performance is not FDA approved in patients less than 37 years old. Performed at [facility], [address] [place], [place] [address]          Radiology Studies: MR BRAIN WO CONTRAST  Result Date: 05/06/2021 CLINICAL DATA:  Posterior headache neck pain. EXAM: MRI HEAD WITHOUT CONTRAST MRI CERVICAL SPINE WITHOUT CONTRAST TECHNIQUE: Multiplanar, multiecho pulse sequences of the brain and surrounding structures, and cervical spine, to include the craniocervical junction and cervicothoracic junction, were obtained without intravenous contrast. COMPARISON:  MR cervical spine 09/16/2020. CT of the head 12/06/2016 FINDINGS: MRI HEAD FINDINGS Brain: Remote  hemorrhagic infarct of the left thalamus again noted. Dilated perivascular spaces noted throughout the basal ganglia. Chronic white matter changes present in the anterior limb of the internal capsule and diffusely in the corona radiata and subcortical spaces over the convexity bilaterally. White matter changes extend into the brainstem. Cerebellum is within normal limits. The internal auditory canals are within normal limits. No acute infarct, hemorrhage, or mass lesion is present. Vascular: Flow is present in the major intracranial arteries. Skull and upper cervical spine: The brainstem and cerebellum are within normal limits. Sinuses/Orbits: Mild mucosal thickening is present in the inferior maxillary sinuses and scattered throughout the ethmoid air cells. The mastoid air cells are clear. Bilateral lens replacements are noted. Globes and orbits are otherwise unremarkable. MRI CERVICAL SPINE FINDINGS Alignment: Slight degenerative anterolisthesis at C3-4 is stable. No other significant listhesis is present. Straightening of the normal cervical lordosis is again noted. Vertebrae: Chronic endplate marrow changes again noted, most evident at C4-5, C5-6 and C6-7. Vertebral body heights are stable. Cord: Normal signal and  morphology. Posterior Fossa, vertebral arteries, paraspinal tissues: Craniocervical junction is normal. Flow is present in the vertebral arteries bilaterally. Visualized intracranial contents are normal. Disc levels: C2-3: Uncovertebral and facet hypertrophy lead to mild foraminal narrowing bilaterally, left greater than right. C3-4: Bilateral uncovertebral scratched at a broad-based disc osteophyte complex is present. Asymmetric left-sided facet hypertrophy is present. Moderate foraminal narrowing is stable, left greater than right. Partial effacement of the ventral CSF noted. C4-5: A rightward disc osteophyte complex is present. Central canal narrowing has progressed. Moderate bilateral foraminal stenosis is present, right greater than left. C5-6: A broad-based disc osteophyte complex partially effaces the ventral CSF. Moderate foraminal narrowing, left greater than right is stable. C6-7: Broad-based disc osteophyte complex partially effaces the ventral CSF. Uncovertebral spurring leads to moderate foraminal narrowing bilaterally, right greater than left. There is no significant interval change. C7-T1: Negative. IMPRESSION: 1. No acute intracranial abnormality. 2. Remote hemorrhagic infarct of the left thalamus. 3. Diffuse confluent white matter disease likely reflects the sequela of chronic microvascular ischemia. 4. Mild sinus disease. 5. Progressive moderate bilateral foraminal narrowing at C4-5, right greater than left. 6. Moderate foraminal narrowing bilaterally at C5-6 is stable. 7. Moderate foraminal narrowing bilaterally at C6-7 is stable, right greater than left. 8. Moderate foraminal narrowing bilaterally at C3-4 is stable, left greater than right. Electronically Signed   By: San Morelle M.D.   On: 05/06/2021 17:33   MR CERVICAL SPINE WO CONTRAST  Result Date: 05/06/2021 CLINICAL DATA:  Posterior headache neck pain. EXAM: MRI HEAD WITHOUT CONTRAST MRI CERVICAL SPINE WITHOUT CONTRAST  TECHNIQUE: Multiplanar, multiecho pulse sequences of the brain and surrounding structures, and cervical spine, to include the craniocervical junction and cervicothoracic junction, were obtained without intravenous contrast. COMPARISON:  MR cervical spine 09/16/2020. CT of the head 12/06/2016 FINDINGS: MRI HEAD FINDINGS Brain: Remote hemorrhagic infarct of the left thalamus again noted. Dilated perivascular spaces noted throughout the basal ganglia. Chronic white matter changes present in the anterior limb of the internal capsule and diffusely in the corona radiata and subcortical spaces over the convexity bilaterally. White matter changes extend into the brainstem. Cerebellum is within normal limits. The internal auditory canals are within normal limits. No acute infarct, hemorrhage, or mass lesion is present. Vascular: Flow is present in the major intracranial arteries. Skull and upper cervical spine: The brainstem and cerebellum are within normal limits. Sinuses/Orbits: Mild mucosal thickening is present in the inferior maxillary sinuses and scattered throughout  the ethmoid air cells. The mastoid air cells are clear. Bilateral lens replacements are noted. Globes and orbits are otherwise unremarkable. MRI CERVICAL SPINE FINDINGS Alignment: Slight degenerative anterolisthesis at C3-4 is stable. No other significant listhesis is present. Straightening of the normal cervical lordosis is again noted. Vertebrae: Chronic endplate marrow changes again noted, most evident at C4-5, C5-6 and C6-7. Vertebral body heights are stable. Cord: Normal signal and morphology. Posterior Fossa, vertebral arteries, paraspinal tissues: Craniocervical junction is normal. Flow is present in the vertebral arteries bilaterally. Visualized intracranial contents are normal. Disc levels: C2-3: Uncovertebral and facet hypertrophy lead to mild foraminal narrowing bilaterally, left greater than right. C3-4: Bilateral uncovertebral scratched at a  broad-based disc osteophyte complex is present. Asymmetric left-sided facet hypertrophy is present. Moderate foraminal narrowing is stable, left greater than right. Partial effacement of the ventral CSF noted. C4-5: A rightward disc osteophyte complex is present. Central canal narrowing has progressed. Moderate bilateral foraminal stenosis is present, right greater than left. C5-6: A broad-based disc osteophyte complex partially effaces the ventral CSF. Moderate foraminal narrowing, left greater than right is stable. C6-7: Broad-based disc osteophyte complex partially effaces the ventral CSF. Uncovertebral spurring leads to moderate foraminal narrowing bilaterally, right greater than left. There is no significant interval change. C7-T1: Negative. IMPRESSION: 1. No acute intracranial abnormality. 2. Remote hemorrhagic infarct of the left thalamus. 3. Diffuse confluent white matter disease likely reflects the sequela of chronic microvascular ischemia. 4. Mild sinus disease. 5. Progressive moderate bilateral foraminal narrowing at C4-5, right greater than left. 6. Moderate foraminal narrowing bilaterally at C5-6 is stable. 7. Moderate foraminal narrowing bilaterally at C6-7 is stable, right greater than left. 8. Moderate foraminal narrowing bilaterally at C3-4 is stable, left greater than right. Electronically Signed   By: San Morelle M.D.   On: 05/06/2021 17:33   ECHOCARDIOGRAM COMPLETE  Result Date: 05/05/2021    ECHOCARDIOGRAM REPORT   Patient Name:   CHRISTOHER SIA Deboer Date of Exam: 05/05/2021 Medical Rec #:  [id number]    Height:       67.0 in Accession #:    [id number]   Weight:       184.5 lb Date of Birth:  [birth year]     BSA:          1.954 m Patient Age:    63 years     BP:           162/87 mmHg Patient Gender: M            HR:           58 bpm. Exam Location:  [facility] Procedure: 2D Echo and Strain Analysis Indications:     CHF I50.9  History:         Patient has no prior history of Echocardiogram  examinations.  Sonographer:     Kathlen Brunswick RDCS Referring Phys:  [id number] XU Diagnosing Phys: Kate Sable MD  Sonographer Comments: Image acquisition challenging due to respiratory motion. Global longitudinal strain was attempted. IMPRESSIONS  1. Left ventricular ejection fraction, by estimation, is 60 to 65%. The left ventricle has normal function. The left ventricle has no regional wall motion abnormalities. There is mild left ventricular hypertrophy. Left ventricular diastolic parameters are consistent with Grade I diastolic dysfunction (impaired relaxation). The average left ventricular global longitudinal strain is -16.0 %.  2. Right ventricular systolic function is normal. The right ventricular size is not well visualized.  3. The mitral valve is normal in structure. No evidence  of mitral valve regurgitation.  4. The aortic valve is tricuspid. Aortic valve regurgitation is not visualized.  5. The inferior vena cava is dilated in size with >50% respiratory variability, suggesting right atrial pressure of 8 mmHg. FINDINGS  Left Ventricle: Left ventricular ejection fraction, by estimation, is 60 to 65%. The left ventricle has normal function. The left ventricle has no regional wall motion abnormalities. The average left ventricular global longitudinal strain is -16.0 %. The left ventricular internal cavity size was normal in size. There is mild left ventricular hypertrophy. Left ventricular diastolic parameters are consistent with Grade I diastolic dysfunction (impaired relaxation). Right Ventricle: The right ventricular size is not well visualized. No increase in right ventricular wall thickness. Right ventricular systolic function is normal. Left Atrium: Left atrial size was normal in size. Right Atrium: Right atrial size was normal in size. Pericardium: Trivial pericardial effusion is present. Mitral Valve: The mitral valve is normal in structure. No evidence of mitral valve regurgitation.  Tricuspid Valve: The tricuspid valve is normal in structure. Tricuspid valve regurgitation is mild. Aortic Valve: The aortic valve is tricuspid. Aortic valve regurgitation is not visualized. Aortic valve peak gradient measures 10.9 mmHg. Pulmonic Valve: The pulmonic valve was not well visualized. Pulmonic valve regurgitation is mild. Aorta: The aortic root and ascending aorta are structurally normal, with no evidence of dilitation. Venous: The inferior vena cava is dilated in size with greater than 50% respiratory variability, suggesting right atrial pressure of 8 mmHg. IAS/Shunts: No atrial level shunt detected by color flow Doppler.  LEFT VENTRICLE PLAX 2D LVIDd:         3.90 cm  Diastology LVIDs:         2.70 cm  LV e' medial:    4.35 cm/s LV PW:         1.50 cm  LV E/e' medial:  12.3 LV IVS:        1.60 cm  LV e' lateral:   5.77 cm/s LVOT diam:     2.00 cm  LV E/e' lateral: 9.3 LV SV:         78 LV SV Index:   40       2D Longitudinal Strain LVOT Area:     3.14 cm 2D Strain GLS Avg:     -16.0 %  RIGHT VENTRICLE RV Basal diam:  2.90 cm RV S prime:     20.80 cm/s TAPSE (M-mode): 1.8 cm LEFT ATRIUM           Index       RIGHT ATRIUM           Index LA diam:      3.50 cm 1.79 cm/m  RA Area:     15.10 cm LA Vol (A4C): 49.4 ml 25.28 ml/m RA Volume:   34.60 ml  17.71 ml/m  AORTIC VALVE                PULMONIC VALVE AV Area (Vmax): 2.25 cm    PV Vmax:       1.21 m/s AV Vmax:        165.00 cm/s PV Peak grad:  5.9 mmHg AV Peak Grad:   10.9 mmHg LVOT Vmax:      118.00 cm/s LVOT Vmean:     71.900 cm/s LVOT VTI:       0.247 m  AORTA Ao Root diam: 3.10 cm Ao Asc diam:  3.50 cm MITRAL VALVE  TRICUSPID VALVE MV Area (PHT): 2.16 cm    TV Peak grad:   29.7 mmHg MV Decel Time: 351 msec    TV Vmax:        2.72 m/s MV E velocity: 53.60 cm/s MV A velocity: 87.40 cm/s  SHUNTS MV E/A ratio:  0.61        Systemic VTI:  0.25 m                            Systemic Diam: 2.00 cm Kate Sable MD Electronically signed  by Kate Sable MD Signature Date/Time: 05/05/2021/3:28:22 PM    Final         Scheduled Meds:  Chlorhexidine Gluconate Cloth  6 each Topical Q0600   enoxaparin (LOVENOX) injection  40 mg Subcutaneous Q24H   insulin aspart  0-9 Units Subcutaneous Q6H   lidocaine  1 patch Transdermal Q24H   metoprolol tartrate  5 mg Intravenous Q6H   pantoprazole (PROTONIX) IV  40 mg Intravenous Q12H   sodium chloride flush  10-40 mL Intracatheter Q12H   Continuous Infusions:  methocarbamol (ROBAXIN) IV 500 mg (05/07/21 0930)   piperacillin-tazobactam (ZOSYN)  IV 12.5 mL/hr at 05/07/21 0610   TPN ADULT (ION) 82 mL/hr at 05/07/21 0610   TPN ADULT (ION)       LOS: 6 days    Time spent:25 mins. More than 50% of that time was spent in counseling and/or coordination of care.      Shelly Coss, MD [facility] P9/12/2020, 11:21 AM

## 2021-05-07 NOTE — Consult Note (Signed)
PHARMACY - TOTAL PARENTERAL NUTRITION CONSULT NOTE   Indication:  Perforated duodenal ulcer, which will req NPO 5-7 days  Patient Measurements: Height: '5\' 7"'$  (170.2 cm) Weight: 83.9 kg (184 lb 15.5 oz) IBW/kg (Calculated) : 66.1 TPN AdjBW (KG): 72.7 Body mass index is 28.97 kg/m. Usual Weight: 86.6 kg on intake (87.1kg 01/2021)  Assessment: 75 y/o male with h/o gout, HTN, lung nodule, anxiety, depression, etoh abuse, DDD, stroke and PUD who is admitted for perforated duodenal ulcer now s/p robotic assisted laparoscopic repair   Glucose / Insulin: BG 119-163  SSI q6h (novolog 4 units/24 hrs)  Electrolytes: Lytes WNL today. Renal: Scr 1.01 Hepatic: ALT 10>97>63; AST 21>90>41; lipase 117; TG 94 Intake / Output; MIVF:  GI Imaging:  8/30: Abdominal XR: 1. Large stool burden without evidence of bowel obstruction. 2. No evidence of free air.  8/30: CT abd - duodenum with crescentic fluid and air collection; Given history of peptic ulcer disease, these findings in conjunction with small volume pneumoperitoneum are likely related to a perforated duodenal ulcer. GI Surgeries / Procedures:  8/30: ex-lap for perforation  Central access: 8/31 TPN start date: 8/31  Nutritional Goals: Goal TPN rate is 82 mL/hr (provides 114.1 g of protein and 2050 kcals per day)  RD Assessment: Estimated Needs Total Energy Estimated Needs: 2000-2300kcal/day Total Protein Estimated Needs: 100-115g/day Total Fluid Estimated Needs: 1.7-2.0L/day  Current Nutrition:  NPO  Plan:  Continue TPN to goal 82 mL/hr at 1800 Electrolytes in TPN (Standard): Na 415mq/L, K 513m/L, Ca 15m215mL, Mg 15mE59m, and Phos 115mm33m. Cl:Ac 1:1 Add standard MVI and trace elements to TPN folic acid daily.  thiamine 3 days completed Continue sensitive q6h SSI. Adjust as needed (currently at goal '180mg'$ /dL) Monitor TPN labs on Mon/Thurs, including daily for first couple days until goal rate established.  Karlea Mckibbin A,  PharmD 05/07/2021 10:22 AM

## 2021-05-08 ENCOUNTER — Inpatient Hospital Stay: Payer: Medicare Other

## 2021-05-08 LAB — CREATININE, SERUM
Creatinine, Ser: 0.95 mg/dL (ref 0.61–1.24)
GFR, Estimated: >60 mL/min (ref >60)

## 2021-05-08 LAB — GLUCOSE, CAPILLARY
Glucose-Capillary: 157 mg/dL — ABNORMAL HIGH (ref 70–99)
Glucose-Capillary: 152 mg/dL — ABNORMAL HIGH (ref 70–99)
Glucose-Capillary: 152 mg/dL — ABNORMAL HIGH (ref 70–99)

## 2021-05-08 MED ORDER — PREGABALIN 75 MG PO CAPS
75.0000 mg | ORAL_CAPSULE | Freq: Three times a day (TID) | ORAL | Status: DC
  Administered 2021-05-08: 75 mg via ORAL
  Filled 2021-05-08: qty 1

## 2021-05-08 MED ORDER — ALPRAZOLAM 0.5 MG PO TABS
0.5000 mg | ORAL_TABLET | Freq: Three times a day (TID) | ORAL | Status: DC | PRN
  Administered 2021-05-09: 01:00:00 0.5 mg via ORAL
  Filled 2021-05-08: qty 1

## 2021-05-08 MED ORDER — OXYCODONE HCL 5 MG PO TABS
5.0000 mg | ORAL_TABLET | ORAL | Status: DC | PRN
  Administered 2021-05-10: 07:00:00 5 mg via ORAL
  Filled 2021-05-08: qty 1

## 2021-05-08 MED ORDER — BOOST / RESOURCE BREEZE PO LIQD CUSTOM
1.0000 | Freq: Three times a day (TID) | ORAL | Status: DC
  Administered 2021-05-08 (×2): 1 via ORAL

## 2021-05-08 MED ORDER — IOHEXOL 300 MG/ML  SOLN
75.0000 mL | Freq: Once | INTRAMUSCULAR | Status: AC | PRN
  Administered 2021-05-08: 75 mL

## 2021-05-08 MED ORDER — DIPHENHYDRAMINE HCL 50 MG/ML IJ SOLN
25.0000 mg | Freq: Once | INTRAMUSCULAR | Status: AC
  Administered 2021-05-08: 04:00:00 25 mg via INTRAVENOUS
  Filled 2021-05-08: qty 1

## 2021-05-08 MED ORDER — ACETAMINOPHEN 10 MG/ML IV SOLN
1000.0000 mg | Freq: Four times a day (QID) | INTRAVENOUS | Status: DC
Start: 2021-05-08 — End: 2021-05-08
  Administered 2021-05-08 (×2): 1000 mg via INTRAVENOUS
  Filled 2021-05-08 (×4): qty 100

## 2021-05-08 MED ORDER — TRAVASOL 10 % IV SOLN
INTRAVENOUS | Status: AC
  Filled 2021-05-08: qty 1141.4

## 2021-05-08 MED ORDER — PROCHLORPERAZINE EDISYLATE 10 MG/2ML IJ SOLN
10.0000 mg | Freq: Once | INTRAMUSCULAR | Status: AC
  Administered 2021-05-08: 10 mg via INTRAVENOUS
  Filled 2021-05-08: qty 2

## 2021-05-08 MED ORDER — ACETAMINOPHEN 500 MG PO TABS
1000.0000 mg | ORAL_TABLET | Freq: Four times a day (QID) | ORAL | Status: DC
  Administered 2021-05-08 – 2021-05-10 (×8): 1000 mg via ORAL
  Filled 2021-05-08 (×8): qty 2

## 2021-05-08 NOTE — Consult Note (Signed)
PHARMACY - TOTAL PARENTERAL NUTRITION CONSULT NOTE   Indication:  Perforated duodenal ulcer, which will req NPO 5-7 days  Patient Measurements: Height: '5\' 7"'$  (170.2 cm) Weight: 82.9 kg (182 lb 12.2 oz) IBW/kg (Calculated) : 66.1 TPN AdjBW (KG): 72.7 Body mass index is 28.62 kg/m. Usual Weight: 86.6 kg on intake (87.1kg 01/2021)  Assessment: 75 y/o male with h/o gout, HTN, lung nodule, anxiety, depression, etoh abuse, DDD, stroke and PUD who is admitted for perforated duodenal ulcer now s/p robotic assisted laparoscopic repair   Glucose / Insulin: BG 132-157 SSI q6h (novolog 4 units/24 hrs)  Electrolytes: Lytes WNL yesterday Renal: Scr 1.01 Hepatic: ALT 10>97>63; AST 21>90>41; lipase 117; TG 94 Intake / Output; MIVF: Net positive 319.9 mL  GI Imaging:  8/30: Abdominal XR: 1. Large stool burden without evidence of bowel obstruction. 2. No evidence of free air.  8/30: CT abd - duodenum with crescentic fluid and air collection; Given history of peptic ulcer disease, these findings in conjunction with small volume pneumoperitoneum are likely related to a perforated duodenal ulcer. GI Surgeries / Procedures:  8/30: ex-lap for perforation  Central access: 8/31 TPN start date: 8/31  Nutritional Goals: Goal TPN rate is 82 mL/hr (provides 114.1 g of protein and 2050 kcals per day)  RD Assessment: Estimated Needs Total Energy Estimated Needs: 2000-2300kcal/day Total Protein Estimated Needs: 100-115g/day Total Fluid Estimated Needs: 1.7-2.0L/day  Current Nutrition:  NPO  Plan:  Continue TPN to goal 82 mL/hr at 1800 Electrolytes in TPN (Standard): Na 50mq/L, K 573m/L, Ca 83m683mL, Mg 83mE74m, and Phos 183mm8m. Cl:Ac 1:1 Add standard MVI and trace elements to TPN folic acid daily.  thiamine 3 days completed Continue sensitive q6h SSI. Adjust as needed (currently at goal '180mg'$ /dL) Monitor TPN labs on Mon/Thurs  MorgaDarnelle BosrmD 05/08/2021 8:51 AM

## 2021-05-08 NOTE — Progress Notes (Signed)
Nutrition Follow-up  DOCUMENTATION CODES:  Not applicable  INTERVENTION:  Continue current diet as ordered, encourage PO intake. Upgrade to fulls when able. TPN per pharmacy - would not recommend weaning TPN until pt is tolerating diet and meeting 50% of needs orally. Could discontinue once pt is meeting >75% of estimated needs Once diet is advanced to full liquids, recommend Ensure Enlive po TID, each supplement provides 350 kcal and 20 grams of protein  NUTRITION DIAGNOSIS:  Inadequate oral intake related to acute illness as evidenced by NPO status.  GOAL:  Patient will meet greater than or equal to 90% of their needs  MONITOR:  Labs, Weight trends, Skin, I & O's, Other (Comment) (TPN)  REASON FOR ASSESSMENT:  Consult New TPN/TNA  ASSESSMENT:  75 y/o male with h/o gout, HTN, lung nodule, anxiety, depression, etoh abuse, DDD, stroke and PUD who is admitted for perforated duodenal ulcer now s/p robotic assisted laparoscopic repair with omental flap and pexy to reinforce closure 8/30  TPN initiated 8/31 and continues to run at goal rate at this time. NGT remains in place. Current plan is for upper GI series this AM to assess healing. If all looks reassuring, plan is to remove NGT and initiate diet.   Attempted to visit with pt - out of room for procedure at the time of assessment. Noted that diet has been advanced to clear liquids with a planned advancement to fulls this afternoon if pt tolerates. Will add supplements to begin this afternoon if pt tolerates diet. Would recommend holding on weaning TPN until pt is tolerating full liquids and meeting ~50% of needs orally. Could discontinue when meeting >75% of needs.   Nutritionally Relevant Medications: Scheduled Meds:  insulin aspart  0-9 Units Subcutaneous Q6H   pantoprazole (PROTONIX) IV  40 mg Intravenous Q12H   Continuous Infusions:  TPN ADULT (ION) 82 mL/hr at 05/08/21 1012   TPN ADULT (ION)     PRN Meds: ondansetron,  prochlorperazine  Labs Reviewed  NUTRITION - FOCUSED PHYSICAL EXAM: Flowsheet Row Most Recent Value  Orbital Region No depletion  Upper Arm Region Mild depletion  Thoracic and Lumbar Region No depletion  Buccal Region No depletion  Temple Region No depletion  Clavicle Bone Region Moderate depletion  Clavicle and Acromion Bone Region Moderate depletion  Scapular Bone Region Mild depletion  Dorsal Hand No depletion  Patellar Region No depletion  Anterior Thigh Region No depletion  Posterior Calf Region No depletion  Edema (RD Assessment) None  Hair Reviewed  Eyes Reviewed  Mouth Reviewed  Skin Reviewed  Nails Reviewed   Diet Order:   Diet Order             Diet NPO time specified Except for: Ice Chips  Diet effective now                  EDUCATION NEEDS:  Education needs have been addressed  Skin:  Skin Assessment: Reviewed RN Assessment (incision abdomen)  Last BM:  9/6 - type 6  Height:  Ht Readings from Last 1 Encounters:  05/01/21 '5\' 7"'$  (1.702 m)   Weight:  Wt Readings from Last 1 Encounters:  05/08/21 82.9 kg   Ideal Body Weight:  67.27 kg  BMI:  Body mass index is 28.62 kg/m.  Estimated Nutritional Needs:  Kcal:  2000-2300kcal/day Protein:  100-115g/day Fluid:  1.7-2.0L/day  Ranell Patrick, RD, LDN Clinical Dietitian Pager on Amion

## 2021-05-08 NOTE — Plan of Care (Signed)
Patient orientedx3, disoriented to time but easily reoriented. NGT in left nare to intermittent suction, flushed with 55m water q4hr per order. TPN infusing w/o issue. Continues to c/o severe headache unrelieved with morphine, received x1 migraine cocktail with tylenol with some relief and able to sleep for a time afterwards. Dressings changed to abdomen and JP drain, 055moutput in drain overnight. Turns self in bed. Adequate UO, no BM this shift. Fall/safety precautions in place, rounding performed, needs/concerns addressed.   Problem: Education: Goal: Knowledge of General Education information will improve Description: Including pain rating scale, medication(s)/side effects and non-pharmacologic comfort measures Outcome: Progressing   Problem: Health Behavior/Discharge Planning: Goal: Ability to manage health-related needs will improve Outcome: Progressing   Problem: Clinical Measurements: Goal: Ability to maintain clinical measurements within normal limits will improve Outcome: Progressing Goal: Will remain free from infection Outcome: Progressing Goal: Diagnostic test results will improve Outcome: Progressing Goal: Respiratory complications will improve Outcome: Progressing Goal: Cardiovascular complication will be avoided Outcome: Progressing   Problem: Activity: Goal: Risk for activity intolerance will decrease Outcome: Progressing   Problem: Nutrition: Goal: Adequate nutrition will be maintained Outcome: Progressing   Problem: Coping: Goal: Level of anxiety will decrease Outcome: Progressing   Problem: Elimination: Goal: Will not experience complications related to bowel motility Outcome: Progressing Goal: Will not experience complications related to urinary retention Outcome: Progressing   Problem: Pain Managment: Goal: General experience of comfort will improve Outcome: Progressing   Problem: Safety: Goal: Ability to remain free from injury will  improve Outcome: Progressing   Problem: Skin Integrity: Goal: Risk for impaired skin integrity will decrease Outcome: Progressing

## 2021-05-08 NOTE — Progress Notes (Signed)
[place] [facility] Day(s): 7.   Post op day(s): 7 Days Post-Op.   Interval History:  Patient seen and examined No acute events or new complaints overnight.  Patient reports he is doing well; some abdominal soreness No fever, chills, nausea No new labs this morning NGT output is recorded at 450 ccs Surgical drain with 10 ccs out; serous He has remained strict NPO; on TPN  Vital signs in last 24 hours: [min-max] current  Temp:  [98 F (36.7 C)-98.5 F (36.9 C)] 98.5 F (36.9 C) (09/06 0737) Pulse Rate:  [57-70] 68 (09/06 0737) Resp:  [16-20] 18 (09/06 0737) BP: (130-157)/(67-99) 139/99 (09/06 0737) SpO2:  [92 %-99 %] 99 % (09/06 0737) Weight:  [82.9 kg] 82.9 kg (09/06 0412)     Height: '5\' 7"'$  (170.2 cm) Weight: 82.9 kg BMI (Calculated): 28.62   Intake/Output last 2 shifts:  09/05 0701 - 09/06 0700 In: 1933.1 [I.V.:1502; NG/GT:90; IV Piggyback:341.1] Out: 58 [Urine:1150; Emesis/NG output:450; Drains:10]   Physical Exam:  Constitutional: Alert, cooperative and no distress  HEENT: NGT in place Respiratory: Breathing non-labored at rest, on [place] Cardiovascular: Regular rate and sinus rhythm  Gastrointestinal: Soft, incisional soreness, non-distended, no rebound/guarding. Surgical drain in LLQ with serosanguinous output Integumentary: Laparoscopic incisions are CDI with gauze and tegaderm  Labs:  CBC Latest Ref Rng & Units 05/05/2021 05/03/2021 05/02/2021  WBC 4.0 - 10.5 K/uL 7.5 9.1 9.3  Hemoglobin 13.0 - 17.0 g/dL 10.7(L) 10.9(L) 12.3(L)  Hematocrit 39.0 - 52.0 % 32.5(L) 34.0(L) 37.0(L)  Platelets 150 - 400 K/uL 207 225 217   CMP Latest Ref Rng & Units 05/08/2021 05/07/2021 05/05/2021  Glucose 70 - 99 mg/dL - 129(H) 86  BUN 8 - 23 mg/dL - 28(H) 27(H)  Creatinine 0.61 - 1.24 mg/dL 0.95 1.01 1.09  Sodium 135 - 145 mmol/L - 137 140  Potassium 3.5 - 5.1 mmol/L - 3.7 3.7  Chloride 98 - 111 mmol/L - 105 107  CO2 22 - 32 mmol/L - 27 27   Calcium 8.9 - 10.3 mg/dL - 8.5(L) 8.7(L)  Total Protein 6.5 - 8.1 g/dL - 6.1(L) 6.4(L)  Total Bilirubin 0.3 - 1.2 mg/dL - 0.5 0.5  Alkaline Phos 38 - 126 U/L - 38 35(L)  AST 15 - 41 U/L - 23 17  ALT 0 - 44 U/L - 26 29     Imaging studies: No new pertinent imaging studies; UGI study is pending   Assessment/Plan: 75 y.o. male 7 Days Post-Op s/p robotic assisted repair of perforated duodenal ulcer with omental flap.   - Will continue to plan for UGI series this morning to  reassess repair prior to proceeding with NGT removal and initiating diet    - Continue strict NPO for now; TPN at goal rate   - Continue NGT decompression to LIS; monitor and record output             - Continue IV Abx (Zosyn) - Continue IVF resuscitation - Monitor abdominal examination - Monitor surgical drain; record output - Pain control prn; antiemetic prn              - Mobilization as tolerated; worked with PT  All of the above findings and recommendations were discussed with the patient, patient's family (wife at bedside), and the medical team, and all of patient's and family's questions were answered to their expressed satisfaction.  -- Edison Simon, PA-C [facility] 05/08/2021, 8:22 AM [phone] M-F: 7am - 4pm

## 2021-05-08 NOTE — Progress Notes (Signed)
Physical Therapy Treatment Patient Details Name: Carl [name] MRN: [id number] DOB: 1946/09/01 Today's Date: 05/08/2021    History of Present Illness 75 y.o. male presented to [facility] ED today for abdominal pain. Patient reports the acute onset of severe abdominal pain around 11 AM which seems to be worse in the RUQ and radiates to his right shoulder. Pain has been severe and constant in nature. Nothing seems to make this better. Pain worse with ambulation. Some nausea. No fever, chills, CP, SOB, urinary changes, or bowel changes. He does reportedly have a history of perforated ulcer in 2011 but this was managed conservatively. Last EGD was in 2018 with Dr Allen Norris which showed gastric ulceration. Patient is s/p perforated deodenal ulcer repair.    PT Comments    Pt was long sitting in bed upon arriving. Had NG tube removed earlier in day. He agrees to session and is cooperative and pleasant throughout. Continues to endorse headache however reports it's improved from previous session. Pt was easily able to exit bed, stand, and ambulate 200 ft with RW. Several standing rest breaks during. He returned to room, has loose BM prior to ambulating another 200 ft in hallway. Vitals stable throughout on room air. Overall pt is progressing. Will attempt ambulation without RW next session.Recommend HHPT at DC to progress pt to PLOF.    Follow Up Recommendations  Home health PT;Supervision/Assistance - 24 hour     Equipment Recommendations  Rolling walker with 5" wheels       Precautions / Restrictions Precautions Precautions: Fall Precaution Comments: TPN- NG tube removed this date Restrictions Weight Bearing Restrictions: No    Mobility  Bed Mobility Overal bed mobility: Modified Independent Bed Mobility: Supine to Sit;Sit to Supine     Supine to sit: Supervision Sit to supine: Supervision        Transfers Overall transfer level: Needs assistance Equipment used: Rolling walker (2  wheeled) Transfers: Sit to/from Stand Sit to Stand: Min guard         General transfer comment: CGA for safety only. STS from lowest bed height 3 x and 1 x from [facility]  Ambulation/Gait Ambulation/Gait assistance: Min guard Gait Distance (Feet): 200 Feet Assistive device: Rolling walker (2 wheeled) Gait Pattern/deviations: Step-through pattern;Decreased step length - right;Decreased step length - left Gait velocity: decreased   General Gait Details: pt ambulated 2 x 200 ft with slow gait cadence. several standing rest however overall tolerated well     Balance Overall balance assessment: Needs assistance Sitting-balance support: Feet supported;Bilateral upper extremity supported Sitting balance-Leahy Scale: Fair     Standing balance support: Bilateral upper extremity supported;During functional activity Standing balance-Leahy Scale: Fair      Cognition Arousal/Alertness: Awake/alert Behavior During Therapy: Flat affect Overall Cognitive Status: Within Functional Limits for tasks assessed      General Comments: Pt is A and O however flat affect with increased time to response. 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 stroke             Pertinent Vitals/Pain Pain Assessment: No/denies pain Pain Location: headache L side     PT Goals (current goals can now be found in the care plan section) Acute Rehab PT Goals Patient Stated Goal: get well and return home Progress towards PT goals: Progressing toward goals    Frequency    Min 2X/week      PT Plan Current plan remains appropriate       AM-PAC PT "6 Clicks" Mobility  Outcome Measure  Help needed turning from your back to your side while in a flat bed without using bedrails?: A Little Help needed moving from lying on your back to sitting on the side of a flat bed without using bedrails?: A Little Help needed moving to and from a bed to a chair (including a wheelchair)?: A Little Help needed  standing up from a chair using your arms (e.g., wheelchair or bedside chair)?: A Little Help needed to walk in hospital room?: A Little Help needed climbing 3-5 steps with a railing? : A Little 6 Click Score: 18    End of Session Equipment Utilized During Treatment: Gait belt Activity Tolerance: Patient tolerated treatment well;Patient limited by fatigue Patient left: in bed;with call bell/phone within reach;with bed alarm set;with family/visitor present Nurse Communication: Mobility status PT Visit Diagnosis: Other abnormalities of gait and mobility (R26.89);Muscle weakness (generalized) (M62.81);Pain Pain - Right/Left: Left     Time: [id number] PT Time Calculation (min) (ACUTE ONLY): 41 min  Charges:  $Gait Training: 23-37 mins $Therapeutic Activity: 8-22 mins                    Julaine Fusi PTA 05/08/21, 4:08 PM

## 2021-05-08 NOTE — Progress Notes (Signed)
PROGRESS NOTE    Carl [name]  [id number] DOB: 1945/10/25 DOA: 05/01/2021 PCP: [facility]   Chief Complain: Abdominal pain  Brief Narrative: Patient is a 75 year old male with history of tobacco use, alcoholism, gout, gastric ulcer, hypertension, CVA with mild cognitive impairment who was admitted to surgical service for perforated duodenal ulcer, pneumoperitoneum.  He is status post laparoscopic repair of the perforated duodenal ulcer on 8/30.  Postoperatively he developed sinus tachycardia, acute hypoxic respiratory failure, acute encephalopathy likely from hospital-acquired delirium.  We were consulted for medical management.  Currently hemodynamically stable.  Mental status has improved and is currently alert and oriented.  Assessment & Plan:   Active Problems:   Pneumoperitoneum   Bowel perforation (HCC)   Acute hypoxic respiratory failure: Suspected to be from atelectasis.  Chest x-ray cardiomegaly with small left pleural effusion, subsegmental atelectasis at the bases.  D-dimer elevated.  Venous Doppler did not show any DVT.  Echo did not show any acute findings patient is not volume overloaded.  Tachypnea has resolved.  On room air today   Tachycardia: Resolved.  Currently on  normal sinus rhythm.  Hypertension: On Coreg, Norvasc, hydralazine, Cozaar, Cardura at home.  Currently NPO.  Monitor blood pressure maintenance with as needed medications.  AKI: Kidney function has improved.  Currently at baseline.  Perforated duodenal ulcer:  He is status post laparoscopic repair of the perforated duodenal ulcer on 8/30.Management as per general surgery.On TPN,zosyn  Chronic headache/neck pain: On Tylenol, Robaxin, as needed pain medicines.  MRI of the brain showed remote hemorrhagic infarct of the left thalamus, MRI of the cervical spine did not show any acute abnormalities, diffuse moderate multiple foraminal narrowing.  Continue supportive care.  Follow-up with  neurosurgery as an outpatient. 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  Headache improved with migraine cocktail on 05/07/21.  Nutrition Problem: Inadequate oral intake Etiology: acute illness       Procedures:As above  Antimicrobials:  Anti-infectives (From admission, onward)    Start     Dose/Rate Route Frequency Ordered Stop   05/01/21 2200  piperacillin-tazobactam (ZOSYN) IVPB 3.375 g        3.375 g 12.5 mL/hr over 240 Minutes Intravenous Every 8 hours 05/01/21 2125 05/08/21 2159   05/01/21 1530  piperacillin-tazobactam (ZOSYN) IVPB 3.375 g  Status:  Discontinued        3.375 g 12.5 mL/hr over 240 Minutes Intravenous Every 8 hours 05/01/21 1521 05/01/21 2125   05/01/21 1445  cefTRIAXone (ROCEPHIN) 1 g in sodium chloride 0.9 % 100 mL IVPB  Status:  Discontinued        1 g 200 mL/hr over 30 Minutes Intravenous  Once 05/01/21 1432 05/01/21 1521   05/01/21 1445  metroNIDAZOLE (FLAGYL) IVPB 500 mg  Status:  Discontinued        500 mg 100 mL/hr over 60 Minutes Intravenous  Once 05/01/21 1432 05/01/21 1521       Subjective: Patient seen and examined the bedside this morning.  Hemodynamically  stable.  His headache has significantly improved today.  Had a bowel movement today.  Denies any abdominal pain, nausea or vomiting.  Objective: Vitals:   05/07/21 2335 05/08/21 0354 05/08/21 0412 05/08/21 0734  BP: (!) 141/89 (!) 150/67    Pulse: 65 61    Resp: '18 16  19  '$ Temp: 98.1 F (36.7 C) 98.5 F (36.9 C)  TempSrc: Oral     SpO2: 96% 94%    Weight:   82.9 kg   Height:        Intake/Output Summary (Last 24 hours) at 05/08/2021 0737 Last data filed at 05/08/2021 0459 Gross per 24 hour  Intake 1933.09 ml  Output 1610 ml  Net 323.09 ml   Filed Weights   05/06/21 0430 05/07/21 0436 05/08/21 0412  Weight: 83.8 kg 83.9 kg 82.9 kg    Examination:  General exam: Overall comfortable, not  in distress HEENT: PERRL,NG tube Respiratory system:  no wheezes or crackles  Cardiovascular system: S1 & S2 heard, RRR.  Gastrointestinal system: Abdomen is nondistended, soft and nontender.Good bowel sounds,surgical drain Central nervous system: Alert and oriented Extremities: No edema, no clubbing ,no cyanosis Skin: No rashes, no ulcers,no icterus         Data Reviewed: I have personally reviewed following labs and imaging studies  CBC: Recent Labs  Lab 05/01/21 1328 05/02/21 0746 05/03/21 0459 05/05/21 0350  WBC 6.9 9.3 9.1 7.5  HGB 13.5 12.3* 10.9* 10.7*  HCT 40.3 37.0* 34.0* 32.5*  MCV 86.7 88.1 89.5 87.1  PLT 292 217 225 [id number]   Basic Metabolic Panel: Recent Labs  Lab 05/02/21 0746 05/03/21 0459 05/04/21 0505 05/05/21 0350 05/07/21 0427 05/08/21 0413  NA 142 142 139 140 137  --   K 4.1 4.3 3.7 3.7 3.7  --   CL 112* 113* 108 107 105  --   CO2 '22 24 28 27 27  '$ --   GLUCOSE 145* 115* 110* 86 129*  --   BUN 34* 32* 23 27* 28*  --   CREATININE 1.39* 1.18 0.98 1.09 1.01 0.95  CALCIUM 8.4* 8.5* 8.3* 8.7* 8.5*  --   MG 1.7 1.9 1.8 1.9 2.0  --   PHOS 3.7 3.1 2.1* 2.6 3.6  --    GFR: Estimated Creatinine Clearance: 70.2 mL/min (by C-G formula based on SCr of 0.95 mg/dL). Liver Function Tests: Recent Labs  Lab 05/01/21 1328 05/02/21 0746 05/03/21 0459 05/04/21 0505 05/05/21 0350 05/07/21 0427  AST 21 90* 41  --  17 23  ALT 10 97* 63*  --  29 26  ALKPHOS 54 36* 31*  --  35* 38  BILITOT 0.7 0.4 0.5  --  0.5 0.5  PROT 7.1 6.2* 6.1*  --  6.4* 6.1*  ALBUMIN 3.7 3.2* 3.1* 2.9* 2.7* 2.6*   Recent Labs  Lab 05/01/21 1328  LIPASE 117*   No results for input(s): AMMONIA in the last 168 hours. Coagulation Profile: No results for input(s): INR, PROTIME in the last 168 hours. Cardiac Enzymes: No results for input(s): CKTOTAL, CKMB, CKMBINDEX, TROPONINI in the last 168 hours. BNP (last 3 results) No results for input(s): PROBNP in the last 8760 hours. HbA1C: No  results for input(s): HGBA1C in the last 72 hours. CBG: Recent Labs  Lab 05/07/21 0105 05/07/21 0624 05/07/21 1215 05/07/21 2334 05/08/21 0605  GLUCAP 131* 143* 132* 129* 157*   Lipid Profile: Recent Labs    05/07/21 0427  TRIG 94   Thyroid Function Tests: No results for input(s): TSH, T4TOTAL, FREET4, T3FREE, THYROIDAB in the last 72 hours. Anemia Panel: No results for input(s): VITAMINB12, FOLATE, FERRITIN, TIBC, IRON, RETICCTPCT in the last 72 hours. Sepsis Labs: Recent Labs  Lab 05/01/21 1439  LATICACIDVEN 1.5    Recent Results (from the past 240 hour(s))  Resp Panel by RT-PCR (Flu A&B, Covid) Nasopharyngeal Swab     Status:  None   Collection Time: 05/01/21  2:39 PM   Specimen: Nasopharyngeal Swab; Nasopharyngeal(NP) swabs in vial transport medium  Result Value Ref Range Status   SARS Coronavirus 2 by RT PCR NEGATIVE NEGATIVE Final    Comment: (NOTE) SARS-CoV-2 target nucleic acids are NOT DETECTED.  The SARS-CoV-2 RNA is generally detectable in upper respiratory specimens during the acute phase of infection. The lowest concentration of SARS-CoV-2 viral copies this assay can detect is 138 copies/mL. A negative result does not preclude SARS-Cov-2 infection and should not be used as the sole basis for treatment or other patient management decisions. A negative result may occur with  improper specimen collection/handling, submission of specimen other than nasopharyngeal swab, presence of viral mutation(s) within the areas targeted by this assay, and inadequate number of viral copies(<138 copies/mL). A negative result must be combined with clinical observations, patient history, and epidemiological information. The expected result is Negative.  Fact Sheet for Patients:  EntrepreneurPulse.com.au  Fact Sheet for Healthcare Providers:  IncredibleEmployment.be  This test is no t yet approved or cleared by the [place] FDA and   has been authorized for detection and/or diagnosis of SARS-CoV-2 by FDA under an Emergency Use Authorization (EUA). This EUA will remain  in effect (meaning this test can be used) for the duration of the COVID-19 declaration under Section 564(b)(1) of the Act, 21 U.S.C.section 360bbb-3(b)(1), unless the authorization is terminated  or revoked sooner.       Influenza A by PCR NEGATIVE NEGATIVE Final   Influenza B by PCR NEGATIVE NEGATIVE Final    Comment: (NOTE) The Xpert Xpress SARS-CoV-2/FLU/RSV plus assay is intended as an aid in the diagnosis of influenza from Nasopharyngeal swab specimens and should not be used as a sole basis for treatment. Nasal washings and aspirates are unacceptable for Xpert Xpress SARS-CoV-2/FLU/RSV testing.  Fact Sheet for Patients: EntrepreneurPulse.com.au  Fact Sheet for Healthcare Providers: IncredibleEmployment.be  This test is not yet approved or cleared by the [place] FDA and has been authorized for detection and/or diagnosis of SARS-CoV-2 by FDA under an Emergency Use Authorization (EUA). This EUA will remain in effect (meaning this test can be used) for the duration of the COVID-19 declaration under Section 564(b)(1) of the Act, 21 U.S.C. section 360bbb-3(b)(1), unless the authorization is terminated or revoked.  Performed at [facility], [address] [place], [place] [address]   MRSA Next Gen by PCR, Nasal     Status: None   Collection Time: 05/01/21 10:14 PM   Specimen: Nasal Mucosa; Nasal Swab  Result Value Ref Range Status   MRSA by PCR Next Gen NOT DETECTED NOT DETECTED Final    Comment: (NOTE) The GeneXpert MRSA Assay (FDA approved for NASAL specimens only), is one component of a comprehensive MRSA colonization surveillance program. It is not intended to diagnose MRSA infection nor to guide or monitor treatment for MRSA infections. Test performance is not FDA approved in  patients less than 28 years old. Performed at [facility], [address], [place],  [address]          Radiology Studies: MR BRAIN WO CONTRAST  Result Date: 05/06/2021 CLINICAL DATA:  Posterior headache neck pain. EXAM: MRI HEAD WITHOUT CONTRAST MRI CERVICAL SPINE WITHOUT CONTRAST TECHNIQUE: Multiplanar, multiecho pulse sequences of the brain and surrounding structures, and cervical spine, to include the craniocervical junction and cervicothoracic junction, were obtained without intravenous contrast. COMPARISON:  MR cervical spine 09/16/2020. CT of the head 12/06/2016 FINDINGS: MRI HEAD  FINDINGS Brain: Remote hemorrhagic infarct of the left thalamus again noted. Dilated perivascular spaces noted throughout the basal ganglia. Chronic white matter changes present in the anterior limb of the internal capsule and diffusely in the corona radiata and subcortical spaces over the convexity bilaterally. White matter changes extend into the brainstem. Cerebellum is within normal limits. The internal auditory canals are within normal limits. No acute infarct, hemorrhage, or mass lesion is present. Vascular: Flow is present in the major intracranial arteries. Skull and upper cervical spine: The brainstem and cerebellum are within normal limits. Sinuses/Orbits: Mild mucosal thickening is present in the inferior maxillary sinuses and scattered throughout the ethmoid air cells. The mastoid air cells are clear. Bilateral lens replacements are noted. Globes and orbits are otherwise unremarkable. MRI CERVICAL SPINE FINDINGS Alignment: Slight degenerative anterolisthesis at C3-4 is stable. No other significant listhesis is present. Straightening of the normal cervical lordosis is again noted. Vertebrae: Chronic endplate marrow changes again noted, most evident at C4-5, C5-6 and C6-7. Vertebral body heights are stable. Cord: Normal signal and morphology. Posterior Fossa, vertebral arteries, paraspinal  tissues: Craniocervical junction is normal. Flow is present in the vertebral arteries bilaterally. Visualized intracranial contents are normal. Disc levels: C2-3: Uncovertebral and facet hypertrophy lead to mild foraminal narrowing bilaterally, left greater than right. C3-4: Bilateral uncovertebral scratched at a broad-based disc osteophyte complex is present. Asymmetric left-sided facet hypertrophy is present. Moderate foraminal narrowing is stable, left greater than right. Partial effacement of the ventral CSF noted. C4-5: A rightward disc osteophyte complex is present. Central canal narrowing has progressed. Moderate bilateral foraminal stenosis is present, right greater than left. C5-6: A broad-based disc osteophyte complex partially effaces the ventral CSF. Moderate foraminal narrowing, left greater than right is stable. C6-7: Broad-based disc osteophyte complex partially effaces the ventral CSF. Uncovertebral spurring leads to moderate foraminal narrowing bilaterally, right greater than left. There is no significant interval change. C7-T1: Negative. IMPRESSION: 1. No acute intracranial abnormality. 2. Remote hemorrhagic infarct of the left thalamus. 3. Diffuse confluent white matter disease likely reflects the sequela of chronic microvascular ischemia. 4. Mild sinus disease. 5. Progressive moderate bilateral foraminal narrowing at C4-5, right greater than left. 6. Moderate foraminal narrowing bilaterally at C5-6 is stable. 7. Moderate foraminal narrowing bilaterally at C6-7 is stable, right greater than left. 8. Moderate foraminal narrowing bilaterally at C3-4 is stable, left greater than right. Electronically Signed   By: San Morelle M.D.   On: 05/06/2021 17:33   MR CERVICAL SPINE WO CONTRAST  Result Date: 05/06/2021 CLINICAL DATA:  Posterior headache neck pain. EXAM: MRI HEAD WITHOUT CONTRAST MRI CERVICAL SPINE WITHOUT CONTRAST TECHNIQUE: Multiplanar, multiecho pulse sequences of the brain and  surrounding structures, and cervical spine, to include the craniocervical junction and cervicothoracic junction, were obtained without intravenous contrast. COMPARISON:  MR cervical spine 09/16/2020. CT of the head 12/06/2016 FINDINGS: MRI HEAD FINDINGS Brain: Remote hemorrhagic infarct of the left thalamus again noted. Dilated perivascular spaces noted throughout the basal ganglia. Chronic white matter changes present in the anterior limb of the internal capsule and diffusely in the corona radiata and subcortical spaces over the convexity bilaterally. White matter changes extend into the brainstem. Cerebellum is within normal limits. The internal auditory canals are within normal limits. No acute infarct, hemorrhage, or mass lesion is present. Vascular: Flow is present in the major intracranial arteries. Skull and upper cervical spine: The brainstem and cerebellum are within normal limits. Sinuses/Orbits: Mild mucosal thickening is present in the inferior maxillary sinuses  and scattered throughout the ethmoid air cells. The mastoid air cells are clear. Bilateral lens replacements are noted. Globes and orbits are otherwise unremarkable. MRI CERVICAL SPINE FINDINGS Alignment: Slight degenerative anterolisthesis at C3-4 is stable. No other significant listhesis is present. Straightening of the normal cervical lordosis is again noted. Vertebrae: Chronic endplate marrow changes again noted, most evident at C4-5, C5-6 and C6-7. Vertebral body heights are stable. Cord: Normal signal and morphology. Posterior Fossa, vertebral arteries, paraspinal tissues: Craniocervical junction is normal. Flow is present in the vertebral arteries bilaterally. Visualized intracranial contents are normal. Disc levels: C2-3: Uncovertebral and facet hypertrophy lead to mild foraminal narrowing bilaterally, left greater than right. C3-4: Bilateral uncovertebral scratched at a broad-based disc osteophyte complex is present. Asymmetric  left-sided facet hypertrophy is present. Moderate foraminal narrowing is stable, left greater than right. Partial effacement of the ventral CSF noted. C4-5: A rightward disc osteophyte complex is present. Central canal narrowing has progressed. Moderate bilateral foraminal stenosis is present, right greater than left. C5-6: A broad-based disc osteophyte complex partially effaces the ventral CSF. Moderate foraminal narrowing, left greater than right is stable. C6-7: Broad-based disc osteophyte complex partially effaces the ventral CSF. Uncovertebral spurring leads to moderate foraminal narrowing bilaterally, right greater than left. There is no significant interval change. C7-T1: Negative. IMPRESSION: 1. No acute intracranial abnormality. 2. Remote hemorrhagic infarct of the left thalamus. 3. Diffuse confluent white matter disease likely reflects the sequela of chronic microvascular ischemia. 4. Mild sinus disease. 5. Progressive moderate bilateral foraminal narrowing at C4-5, right greater than left. 6. Moderate foraminal narrowing bilaterally at C5-6 is stable. 7. Moderate foraminal narrowing bilaterally at C6-7 is stable, right greater than left. 8. Moderate foraminal narrowing bilaterally at C3-4 is stable, left greater than right. Electronically Signed   By: San Morelle M.D.   On: 05/06/2021 17:33        Scheduled Meds:  Chlorhexidine Gluconate Cloth  6 each Topical Q0600   enoxaparin (LOVENOX) injection  40 mg Subcutaneous Q24H   insulin aspart  0-9 Units Subcutaneous Q6H   lidocaine  1 patch Transdermal Q24H   metoprolol tartrate  5 mg Intravenous Q6H   pantoprazole (PROTONIX) IV  40 mg Intravenous Q12H   sodium chloride flush  10-40 mL Intracatheter Q12H   Continuous Infusions:  acetaminophen Stopped (05/08/21 0459)   methocarbamol (ROBAXIN) IV Stopped (05/08/21 0159)   piperacillin-tazobactam (ZOSYN)  IV 3.375 g (05/08/21 0617)   TPN ADULT (ION) 82 mL/hr at 05/07/21 2344      LOS: 7 days    Time spent:25 mins. More than 50% of that time was spent in counseling and/or coordination of care.      Shelly Coss, MD [facility] P9/01/2021, 7:37 AM

## 2021-05-09 LAB — GLUCOSE, CAPILLARY
Glucose-Capillary: 113 mg/dL — ABNORMAL HIGH (ref 70–99)
Glucose-Capillary: 120 mg/dL — ABNORMAL HIGH (ref 70–99)
Glucose-Capillary: 125 mg/dL — ABNORMAL HIGH (ref 70–99)
Glucose-Capillary: 147 mg/dL — ABNORMAL HIGH (ref 70–99)
Glucose-Capillary: 152 mg/dL — ABNORMAL HIGH (ref 70–99)

## 2021-05-09 MED ORDER — SUMATRIPTAN SUCCINATE 6 MG/0.5ML ~~LOC~~ SOLN
6.0000 mg | Freq: Once | SUBCUTANEOUS | Status: AC
  Administered 2021-05-09: 09:00:00 6 mg via SUBCUTANEOUS
  Filled 2021-05-09: qty 0.5

## 2021-05-09 MED ORDER — LOSARTAN POTASSIUM 50 MG PO TABS
100.0000 mg | ORAL_TABLET | Freq: Every day | ORAL | Status: DC
  Administered 2021-05-09 – 2021-05-10 (×2): 100 mg via ORAL
  Filled 2021-05-09 (×2): qty 2

## 2021-05-09 MED ORDER — HYDRALAZINE HCL 50 MG PO TABS
100.0000 mg | ORAL_TABLET | Freq: Every day | ORAL | Status: DC
  Administered 2021-05-09 – 2021-05-10 (×2): 100 mg via ORAL
  Filled 2021-05-09 (×2): qty 2

## 2021-05-09 MED ORDER — CARVEDILOL 6.25 MG PO TABS
6.2500 mg | ORAL_TABLET | Freq: Two times a day (BID) | ORAL | Status: DC
  Administered 2021-05-09 – 2021-05-10 (×3): 6.25 mg via ORAL
  Filled 2021-05-09 (×3): qty 1

## 2021-05-09 MED ORDER — BUTALBITAL-APAP-CAFFEINE 50-325-40 MG PO TABS: 1.0000 | ORAL_TABLET | ORAL | Status: DC | PRN

## 2021-05-09 MED ORDER — SUMATRIPTAN SUCCINATE 50 MG PO TABS
50.0000 mg | ORAL_TABLET | ORAL | Status: DC | PRN
  Administered 2021-05-09 – 2021-05-10 (×2): 50 mg via ORAL
  Filled 2021-05-09 (×4): qty 1

## 2021-05-09 MED ORDER — ENSURE ENLIVE PO LIQD
237.0000 mL | Freq: Two times a day (BID) | ORAL | Status: DC
  Administered 2021-05-09: 237 mL via ORAL

## 2021-05-09 MED ORDER — AMLODIPINE BESYLATE 5 MG PO TABS
5.0000 mg | ORAL_TABLET | Freq: Every day | ORAL | Status: DC
  Administered 2021-05-09 – 2021-05-10 (×2): 5 mg via ORAL
  Filled 2021-05-09 (×3): qty 1

## 2021-05-09 MED ORDER — METHOCARBAMOL 500 MG PO TABS
500.0000 mg | ORAL_TABLET | Freq: Three times a day (TID) | ORAL | Status: DC
  Administered 2021-05-09 – 2021-05-10 (×4): 500 mg via ORAL
  Filled 2021-05-09 (×6): qty 1

## 2021-05-09 MED ORDER — TRAVASOL 10 % IV SOLN
INTRAVENOUS | Status: DC
  Filled 2021-05-09: qty 556.8

## 2021-05-09 NOTE — TOC Progression Note (Signed)
Transition of Care [facility]) - Progression Note    Patient Details  Name: Carl [name] MRN: [id number] Date of Birth: [birth year]  Transition of Care [facility]) CM/SW Contact  Shelbie Hutching, RN Phone Number: 05/09/2021, 1:49 PM  Clinical Narrative:    Patient is tolerating diet and TPN will be cut in half and then discontinued tomorrow per pharmacy.  Patient may be ready for discharge tomorrow.  Patient agrees with home health PT and chooses [facility].  Corene Cornea with [facility] accepts referral.     Expected Discharge Plan: [place] Barriers to Discharge: Continued Medical Work up  Expected Discharge Plan and Services Expected Discharge Plan: [place]   Discharge Planning Services: CM Consult Post Acute Care Choice: [place] arrangements for the past 2 months: Single Family Home                 DME Arranged: N/A DME Agency: NA       HH Arranged: PT HH Agency: [place] ([facility]) Date HH Agency Contacted: 05/09/21 Time [place]: [phone] Representative spoke with at [place]: [place] ([place]) Interventions    Readmission Risk Interventions Readmission Risk Prevention Plan 05/04/2021  Transportation Screening Complete  PCP or Specialist Appt within 3-5 Days Complete  HRI or [place] Complete  Social Work Consult for [place] Planning/Counseling Complete  Palliative Care Screening Not Applicable  Medication Review Press photographer) Complete  Some recent data might be hidden

## 2021-05-09 NOTE — Consult Note (Addendum)
PHARMACY - TOTAL PARENTERAL NUTRITION CONSULT NOTE   Indication:  Perforated duodenal ulcer, which will req NPO 5-7 days  Patient Measurements: Height: '5\' 7"'$  (170.2 cm) Weight: 82.9 kg (182 lb 12.2 oz) IBW/kg (Calculated) : 66.1 TPN AdjBW (KG): 72.7 Body mass index is 28.62 kg/m. Usual Weight: 86.6 kg on intake (87.1kg 01/2021)  Assessment: 75 y/o male with h/o gout, HTN, lung nodule, anxiety, depression, etoh abuse, DDD, stroke and PUD who is admitted for perforated duodenal ulcer now s/p robotic assisted laparoscopic repair   Glucose / Insulin: BG 120-152 SSI q6h (novolog 2 units/24 hrs)   Electrolytes: Lytes WNL- last checked Monday  Renal: Scr 0.95  Hepatic: ALT 10>97>63>26 (last 9/5); AST 21>90>41>23 (last 23)  lipase 117; TG 94  Intake / Output; MIVF: Net +1.1L since admission, LBM 9/6  GI Imaging:  8/30: Abdominal XR: 1. Large stool burden without evidence of bowel obstruction. 2. No evidence of free air.  8/30: CT abd - duodenum with crescentic fluid and air collection; Given history of peptic ulcer disease, these findings in conjunction with small volume pneumoperitoneum are likely related to a perforated duodenal ulcer.  GI Surgeries / Procedures:  8/30: ex-lap for perforation  Central access: 8/31 TPN start date: 8/31  Nutritional Goals: Goal TPN rate is 82 mL/hr (provides 114.1 g of protein and 2050 kcals per day)  RD Assessment: Estimated Needs Total Energy Estimated Needs: 2000-2300kcal/day Total Protein Estimated Needs: 100-115g/day Total Fluid Estimated Needs: 1.7-2.0L/day  Current Nutrition:  NPO  Plan:  Cut TPN to 1/2 rate of 40 mL/hr at 1800 (weaning) Electrolytes in TPN (Standard): Na 39mq/L, K 567m/L, Ca 43m34mL, Mg 43mE643m, and Phos 143mm73m. Cl:Ac 1:1 Add standard MVI and trace elements to TPN folic acid daily.  thiamine 3 days completed Continue sensitive q6h SSI. Adjust as needed (currently at goal '180mg'$ /dL) Monitor TPN labs on Mon/Thurs  (checking tomorrow)  CharlLu DuffelrmD, BCPS Clinical Pharmacist 05/09/2021 7:20 AM

## 2021-05-09 NOTE — Progress Notes (Signed)
PROGRESS NOTE    Carl [name]  [id number] DOB: December 05, 1945 DOA: 05/01/2021 PCP: [facility]   Chief Complain: Abdominal pain  Brief Narrative: Patient is a 75 year old male with history of tobacco use, alcoholism, gout, gastric ulcer, hypertension, CVA with mild cognitive impairment who was admitted to surgical service for perforated duodenal ulcer, pneumoperitoneum.  He is status post laparoscopic repair of the perforated duodenal ulcer on 8/30.  Postoperatively he developed sinus tachycardia, acute hypoxic respiratory failure, acute encephalopathy likely from hospital-acquired delirium.  We were consulted for medical management.  Currently hemodynamically stable.  Mental status has improved and is currently alert and oriented.  Assessment & Plan:   Active Problems:   Pneumoperitoneum   Bowel perforation (HCC)   Acute hypoxic respiratory failure: Suspected to be from atelectasis.  Chest x-ray cardiomegaly with small left pleural effusion, subsegmental atelectasis at the bases.  D-dimer elevated.  Venous Doppler did not show any DVT.  Echo did not show any acute findings, patient is not volume overloaded.  Tachypnea has resolved.  On room air today   Tachycardia: Resolved.  Currently on  normal sinus rhythm.  Hypertension: On Coreg, Norvasc, hydralazine, Cozaar, Cardura at home.  Restarted.  AKI: Kidney function has improved.  Currently at baseline.  Perforated duodenal ulcer:  He is status post laparoscopic repair of the perforated duodenal ulcer on 8/30.Management as per general surgery.On TPN,zosyn.nG tube removed,started regular diet  Chronic headache/neck pain: On Tylenol, Robaxin, as needed pain medicines.  MRI of the brain showed remote hemorrhagic infarct of the left thalamus, MRI of the cervical spine did not show any acute abnormalities, diffuse moderate multiple foraminal narrowing.  Continue supportive care.  Follow-up with neurosurgery as an outpatient. As  per wife, he has this headache since last several years and was thought to be secondary to radiation from the cervical spine.  Headache improved with migraine cocktail on 05/07/21.Ordered PRN Fioricet  Nutrition Problem: Inadequate oral intake Etiology: acute illness       Procedures:As above  Antimicrobials:  Anti-infectives (From admission, onward)    Start     Dose/Rate Route Frequency Ordered Stop   05/01/21 2200  piperacillin-tazobactam (ZOSYN) IVPB 3.375 g        3.375 g 12.5 mL/hr over 240 Minutes Intravenous Every 8 hours 05/01/21 2125 05/08/21 1830   05/01/21 1530  piperacillin-tazobactam (ZOSYN) IVPB 3.375 g  Status:  Discontinued        3.375 g 12.5 mL/hr over 240 Minutes Intravenous Every 8 hours 05/01/21 1521 05/01/21 2125   05/01/21 1445  cefTRIAXone (ROCEPHIN) 1 g in sodium chloride 0.9 % 100 mL IVPB  Status:  Discontinued        1 g 200 mL/hr over 30 Minutes Intravenous  Once 05/01/21 1432 05/01/21 1521   05/01/21 1445  metroNIDAZOLE (FLAGYL) IVPB 500 mg  Status:  Discontinued        500 mg 100 mL/hr over 60 Minutes Intravenous  Once 05/01/21 1432 05/01/21 1521       Subjective: Patient seen and examined the bedside this morning.  Hemodynamically stable.  Comfortable.  NG tube was removed.  He is eating his diet.  Denies Abdomen pain   Objective: Vitals:   05/08/21 1637 05/08/21 2100 05/09/21 0047 05/09/21 0534  BP: (!) 168/93 (!) 164/88 (!) 173/101 (!) 154/72  Pulse: 62 66 70 66  Resp: '18 19  19  '$ Temp: 97.8 F (36.6 C) 97.8 F (36.6 C) 99.2 F (37.3 C) 98.7 F (37.1  C)  TempSrc:  Oral    SpO2: 98% 96% 94% 91%  Weight:      Height:        Intake/Output Summary (Last 24 hours) at 05/09/2021 0733 Last data filed at 05/09/2021 0500 Gross per 24 hour  Intake 2515.55 ml  Output 1595 ml  Net 920.55 ml   Filed Weights   05/06/21 0430 05/07/21 0436 05/08/21 0412  Weight: 83.8 kg 83.9 kg 82.9 kg    Examination:  General exam: Overall comfortable, not  in distress HEENT: PERRL Respiratory system:  no wheezes or crackles  Cardiovascular system: S1 & S2 heard, RRR.  Gastrointestinal system: Abdomen is nondistended, soft and nontender. Central nervous system: Alert and oriented Extremities: No edema, no clubbing ,no cyanosis Skin: No rashes, no ulcers,no icterus         Data Reviewed: I have personally reviewed following labs and imaging studies  CBC: Recent Labs  Lab 05/02/21 0746 05/03/21 0459 05/05/21 0350  WBC 9.3 9.1 7.5  HGB 12.3* 10.9* 10.7*  HCT 37.0* 34.0* 32.5*  MCV 88.1 89.5 87.1  PLT 217 225 [id number]   Basic Metabolic Panel: Recent Labs  Lab 05/02/21 0746 05/03/21 0459 05/04/21 0505 05/05/21 0350 05/07/21 0427 05/08/21 0413  NA 142 142 139 140 137  --   K 4.1 4.3 3.7 3.7 3.7  --   CL 112* 113* 108 107 105  --   CO2 '22 24 28 27 27  '$ --   GLUCOSE 145* 115* 110* 86 129*  --   BUN 34* 32* 23 27* 28*  --   CREATININE 1.39* 1.18 0.98 1.09 1.01 0.95  CALCIUM 8.4* 8.5* 8.3* 8.7* 8.5*  --   MG 1.7 1.9 1.8 1.9 2.0  --   PHOS 3.7 3.1 2.1* 2.6 3.6  --    GFR: Estimated Creatinine Clearance: 70.2 mL/min (by C-G formula based on SCr of 0.95 mg/dL). Liver Function Tests: Recent Labs  Lab 05/02/21 0746 05/03/21 0459 05/04/21 0505 05/05/21 0350 05/07/21 0427  AST 90* 41  --  17 23  ALT 97* 63*  --  29 26  ALKPHOS 36* 31*  --  35* 38  BILITOT 0.4 0.5  --  0.5 0.5  PROT 6.2* 6.1*  --  6.4* 6.1*  ALBUMIN 3.2* 3.1* 2.9* 2.7* 2.6*   No results for input(s): LIPASE, AMYLASE in the last 168 hours.  No results for input(s): AMMONIA in the last 168 hours. Coagulation Profile: No results for input(s): INR, PROTIME in the last 168 hours. Cardiac Enzymes: No results for input(s): CKTOTAL, CKMB, CKMBINDEX, TROPONINI in the last 168 hours. BNP (last 3 results) No results for input(s): PROBNP in the last 8760 hours. HbA1C: No results for input(s): HGBA1C in the last 72 hours. CBG: Recent Labs  Lab 05/08/21 0605  05/08/21 1145 05/08/21 1717 05/09/21 0023 05/09/21 0646  GLUCAP 157* 152* 152* 120* 125*   Lipid Profile: Recent Labs    05/07/21 0427  TRIG 94   Thyroid Function Tests: No results for input(s): TSH, T4TOTAL, FREET4, T3FREE, THYROIDAB in the last 72 hours. Anemia Panel: No results for input(s): VITAMINB12, FOLATE, FERRITIN, TIBC, IRON, RETICCTPCT in the last 72 hours. Sepsis Labs: No results for input(s): PROCALCITON, LATICACIDVEN in the last 168 hours.   Recent Results (from the past 240 hour(s))  Resp Panel by RT-PCR (Flu A&B, Covid) Nasopharyngeal Swab     Status: None   Collection Time: 05/01/21  2:39 PM   Specimen: Nasopharyngeal Swab; Nasopharyngeal(NP) swabs  in vial transport medium  Result Value Ref Range Status   SARS Coronavirus 2 by RT PCR NEGATIVE NEGATIVE Final    Comment: (NOTE) SARS-CoV-2 target nucleic acids are NOT DETECTED.  The SARS-CoV-2 RNA is generally detectable in upper respiratory specimens during the acute phase of infection. The lowest concentration of SARS-CoV-2 viral copies this assay can detect is 138 copies/mL. A negative result does not preclude SARS-Cov-2 infection and should not be used as the sole basis for treatment or other patient management decisions. A negative result may occur with  improper specimen collection/handling, submission of specimen other than nasopharyngeal swab, presence of viral mutation(s) within the areas targeted by this assay, and inadequate number of viral copies(<138 copies/mL). A negative result must be combined with clinical observations, patient history, and epidemiological information. The expected result is Negative.  Fact Sheet for Patients:  EntrepreneurPulse.com.au  Fact Sheet for Healthcare Providers:  IncredibleEmployment.be  This test is no t yet approved or cleared by the [place] FDA and  has been authorized for detection and/or diagnosis of SARS-CoV-2  by FDA under an Emergency Use Authorization (EUA). This EUA will remain  in effect (meaning this test can be used) for the duration of the COVID-19 declaration under Section 564(b)(1) of the Act, 21 U.S.C.section 360bbb-3(b)(1), unless the authorization is terminated  or revoked sooner.       Influenza A by PCR NEGATIVE NEGATIVE Final   Influenza B by PCR NEGATIVE NEGATIVE Final    Comment: (NOTE) The Xpert Xpress SARS-CoV-2/FLU/RSV plus assay is intended as an aid in the diagnosis of influenza from Nasopharyngeal swab specimens and should not be used as a sole basis for treatment. Nasal washings and aspirates are unacceptable for Xpert Xpress SARS-CoV-2/FLU/RSV testing.  Fact Sheet for Patients: EntrepreneurPulse.com.au  Fact Sheet for Healthcare Providers: IncredibleEmployment.be  This test is not yet approved or cleared by the [place] FDA and has been authorized for detection and/or diagnosis of SARS-CoV-2 by FDA under an Emergency Use Authorization (EUA). This EUA will remain in effect (meaning this test can be used) for the duration of the COVID-19 declaration under Section 564(b)(1) of the Act, 21 U.S.C. section 360bbb-3(b)(1), unless the authorization is terminated or revoked.  Performed at [facility], [address] [place], [place] [address]   MRSA Next Gen by PCR, Nasal     Status: None   Collection Time: 05/01/21 10:14 PM   Specimen: Nasal Mucosa; Nasal Swab  Result Value Ref Range Status   MRSA by PCR Next Gen NOT DETECTED NOT DETECTED Final    Comment: (NOTE) The GeneXpert MRSA Assay (FDA approved for NASAL specimens only), is one component of a comprehensive MRSA colonization surveillance program. It is not intended to diagnose MRSA infection nor to guide or monitor treatment for MRSA infections. Test performance is not FDA approved in patients less than 62 years old. Performed at [facility],  [address] [place], [place] [address]          Radiology Studies: DG UGI W SINGLE CM (SOL OR THIN BA)  Result Date: 05/08/2021 CLINICAL DATA:  Status post Phillip Heal patch, perforated duodenal ulcer EXAM: WATER SOLUBLE UPPER GI SERIES TECHNIQUE: Fluoroscopic single contrast leak check performed using water soluble contrast. CONTRAST:  125 mL Omnipaque 300 water-soluble iodinated contrast per NG tube. COMPARISON:  None. FLUOROSCOPY TIME:  Fluoroscopy Time:  00:30 Number of Acquired Spot Images: 5 FINDINGS: Nasogastric tube was gently injected with a total of 150 mL dilute Omnipaque  water-soluble contrast. Ready opacification of the stomach and proximal duodenum. No evidence of contrast extravasation. IMPRESSION: No fluoroscopic evidence of leak following duodenal ulcer repair. Electronically Signed   By: Eddie Candle M.D.   On: 05/08/2021 13:05        Scheduled Meds:  acetaminophen  1,000 mg Oral Q6H   Chlorhexidine Gluconate Cloth  6 each Topical Q0600   enoxaparin (LOVENOX) injection  40 mg Subcutaneous Q24H   feeding supplement  237 mL Oral BID BM   insulin aspart  0-9 Units Subcutaneous Q6H   lidocaine  1 patch Transdermal Q24H   methocarbamol  500 mg Oral TID   metoprolol tartrate  5 mg Intravenous Q6H   pantoprazole (PROTONIX) IV  40 mg Intravenous Q12H   pregabalin  75 mg Oral TID   sodium chloride flush  10-40 mL Intracatheter Q12H   Continuous Infusions:  TPN ADULT (ION) 82 mL/hr at 05/09/21 0351     LOS: 8 days    Time spent:25 mins. More than 50% of that time was spent in counseling and/or coordination of care.      Shelly Coss, MD [facility] P9/03/2021, 7:33 AM

## 2021-05-09 NOTE — Progress Notes (Signed)
POD # 8  Doing very well Ambulated  Avss Did not like lyrica  PE NAD Abd: soft, nt, incision c/d/I, no infection JP serous   A/P advance regular diet Wean off tpn Po and sub q sumatriptan for pain Mobilize Continue a/bs and anticipate DC in am w/o drain

## 2021-05-10 LAB — COMPREHENSIVE METABOLIC PANEL
ALT: 55 U/L — ABNORMAL HIGH (ref 0–44)
AST: 42 U/L — ABNORMAL HIGH (ref 15–41)
Albumin: 2.6 g/dL — ABNORMAL LOW (ref 3.5–5.0)
Alkaline Phosphatase: 51 U/L (ref 38–126)
Anion gap: 6 (ref 5–15)
BUN: 22 mg/dL (ref 8–23)
CO2: 26 mmol/L (ref 22–32)
Calcium: 8.8 mg/dL — ABNORMAL LOW (ref 8.9–10.3)
Chloride: 107 mmol/L (ref 98–111)
Creatinine, Ser: 0.92 mg/dL (ref 0.61–1.24)
GFR, Estimated: >60 mL/min (ref >60)
Glucose, Bld: 123 mg/dL — ABNORMAL HIGH (ref 70–99)
Potassium: 3.8 mmol/L (ref 3.5–5.1)
Sodium: 139 mmol/L (ref 135–145)
Total Bilirubin: 0.8 mg/dL (ref 0.3–1.2)
Total Protein: 6.3 g/dL — ABNORMAL LOW (ref 6.5–8.1)

## 2021-05-10 LAB — GLUCOSE, CAPILLARY: Glucose-Capillary: 117 mg/dL — ABNORMAL HIGH (ref 70–99)

## 2021-05-10 LAB — MAGNESIUM: Magnesium: 1.8 mg/dL (ref 1.7–2.4)

## 2021-05-10 LAB — PHOSPHORUS: Phosphorus: 3.1 mg/dL (ref 2.5–4.6)

## 2021-05-10 MED ORDER — PANTOPRAZOLE SODIUM 40 MG PO TBEC: 40.0000 mg | DELAYED_RELEASE_TABLET | Freq: Two times a day (BID) | ORAL | 1 refills | Status: DC

## 2021-05-10 MED ORDER — OXYCODONE HCL 5 MG PO TABS: 5.0000 mg | ORAL_TABLET | Freq: Four times a day (QID) | ORAL | 0 refills | Status: DC | PRN

## 2021-05-10 MED ORDER — SUMATRIPTAN SUCCINATE 50 MG PO TABS: 50.0000 mg | ORAL_TABLET | ORAL | 0 refills | Status: DC | PRN

## 2021-05-10 MED ORDER — METHOCARBAMOL 500 MG PO TABS: 500.0000 mg | ORAL_TABLET | Freq: Three times a day (TID) | ORAL | 0 refills | Status: DC

## 2021-05-10 NOTE — Progress Notes (Signed)
PROGRESS NOTE    Carl [name]  [id number] DOB: 08-21-1946 DOA: 05/01/2021 PCP: [facility]   Chief Complain: Abdominal pain  Brief Narrative: Patient is a 75 year old male with history of tobacco use, alcoholism, gout, gastric ulcer, hypertension, CVA with mild cognitive impairment who was admitted to surgical service for perforated duodenal ulcer, pneumoperitoneum.  He is status post laparoscopic repair of the perforated duodenal ulcer on 8/30.  Postoperatively he developed sinus tachycardia, acute hypoxic respiratory failure, acute encephalopathy likely from hospital-acquired delirium.  We were consulted for medical management.  Currently hemodynamically stable.  Mental status has improved and is currently alert and oriented.  He is being discharged today by general surgery service  Assessment & Plan:   Active Problems:   Pneumoperitoneum   Bowel perforation (HCC)   Acute hypoxic respiratory failure: Suspected to be from atelectasis.  Chest x-ray cardiomegaly with small left pleural effusion, subsegmental atelectasis at the bases.  D-dimer elevated.  Venous Doppler did not show any DVT.  Echo did not show any acute findings, patient is not volume overloaded.  Tachypnea has resolved.  On room air    Tachycardia: Resolved.  Currently on  normal sinus rhythm.  Hypertension: On Coreg, Norvasc, hydralazine, Cozaar, Cardura at home.  Continue on discharge  AKI: Kidney function has improved.  Currently at baseline.  Perforated duodenal ulcer:  He is status post laparoscopic repair of the perforated duodenal ulcer on 8/30.he was on TPN,zosyn.nG tube removed,started regular diet, tolerating.  Chronic headache/neck pain: Takes Tylenol at home.  MRI of the brain showed remote hemorrhagic infarct of the left thalamus, MRI of the cervical spine did not show any acute abnormalities, diffuse moderate multiple foraminal narrowing.  Continue supportive care.  Follow-up with neurosurgery  as an outpatient. 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   Nutrition Problem: Inadequate oral intake Etiology: acute illness       Procedures:As above  Antimicrobials:  Anti-infectives (From admission, onward)    Start     Dose/Rate Route Frequency Ordered Stop   05/01/21 2200  piperacillin-tazobactam (ZOSYN) IVPB 3.375 g        3.375 g 12.5 mL/hr over 240 Minutes Intravenous Every 8 hours 05/01/21 2125 05/08/21 1830   05/01/21 1530  piperacillin-tazobactam (ZOSYN) IVPB 3.375 g  Status:  Discontinued        3.375 g 12.5 mL/hr over 240 Minutes Intravenous Every 8 hours 05/01/21 1521 05/01/21 2125   05/01/21 1445  cefTRIAXone (ROCEPHIN) 1 g in sodium chloride 0.9 % 100 mL IVPB  Status:  Discontinued        1 g 200 mL/hr over 30 Minutes Intravenous  Once 05/01/21 1432 05/01/21 1521   05/01/21 1445  metroNIDAZOLE (FLAGYL) IVPB 500 mg  Status:  Discontinued        500 mg 100 mL/hr over 60 Minutes Intravenous  Once 05/01/21 1432 05/01/21 1521       Subjective: Patient seen and examined the bedside this morning.  Hemodynamically stable.  Comfortable.  Tolerating diet.  Objective: Vitals:   05/10/21 0332 05/10/21 0444 05/10/21 0828 05/10/21 0830  BP:  131/83 (!) 176/100 (!) 163/93  Pulse:    83  Resp:  18  (!) 22  Temp:    98.5 F (36.9 C)  TempSrc:      SpO2:    96%  Weight: 81.3 kg     Height:  Intake/Output Summary (Last 24 hours) at 05/10/2021 1113 Last data filed at 05/10/2021 1020 Gross per 24 hour  Intake 2729.82 ml  Output 1245 ml  Net 1484.82 ml   Filed Weights   05/08/21 0412 05/09/21 1144 05/10/21 0332  Weight: 82.9 kg 80 kg 81.3 kg    Examination:  General exam: Overall comfortable, not in distress HEENT: PERRL Respiratory system:  no wheezes or crackles  Cardiovascular system: S1 & S2 heard, RRR.  Gastrointestinal system: Abdomen is nondistended, soft and  nontender. Central nervous system: Alert and oriented Extremities: No edema, no clubbing ,no cyanosis Skin: No rashes, no ulcers,no icterus  s         Data Reviewed: I have personally reviewed following labs and imaging studies  CBC: Recent Labs  Lab 05/05/21 0350  WBC 7.5  HGB 10.7*  HCT 32.5*  MCV 87.1  PLT [id number]   Basic Metabolic Panel: Recent Labs  Lab 05/04/21 0505 05/05/21 0350 05/07/21 0427 05/08/21 0413 05/10/21 0435  NA 139 140 137  --  139  K 3.7 3.7 3.7  --  3.8  CL 108 107 105  --  107  CO2 '28 27 27  '$ --  26  GLUCOSE 110* 86 129*  --  123*  BUN 23 27* 28*  --  22  CREATININE 0.98 1.09 1.01 0.95 0.92  CALCIUM 8.3* 8.7* 8.5*  --  8.8*  MG 1.8 1.9 2.0  --  1.8  PHOS 2.1* 2.6 3.6  --  3.1   GFR: Estimated Creatinine Clearance: 71.9 mL/min (by C-G formula based on SCr of 0.92 mg/dL). Liver Function Tests: Recent Labs  Lab 05/04/21 0505 05/05/21 0350 05/07/21 0427 05/10/21 0435  AST  --  17 23 42*  ALT  --  29 26 55*  ALKPHOS  --  35* 38 51  BILITOT  --  0.5 0.5 0.8  PROT  --  6.4* 6.1* 6.3*  ALBUMIN 2.9* 2.7* 2.6* 2.6*   No results for input(s): LIPASE, AMYLASE in the last 168 hours.  No results for input(s): AMMONIA in the last 168 hours. Coagulation Profile: No results for input(s): INR, PROTIME in the last 168 hours. Cardiac Enzymes: No results for input(s): CKTOTAL, CKMB, CKMBINDEX, TROPONINI in the last 168 hours. BNP (last 3 results) No results for input(s): PROBNP in the last 8760 hours. HbA1C: No results for input(s): HGBA1C in the last 72 hours. CBG: Recent Labs  Lab 05/09/21 0646 05/09/21 1228 05/09/21 1745 05/09/21 2338 05/10/21 0446  GLUCAP 125* 152* 147* 113* 117*   Lipid Profile: No results for input(s): CHOL, HDL, LDLCALC, TRIG, CHOLHDL, LDLDIRECT in the last 72 hours.  Thyroid Function Tests: No results for input(s): TSH, T4TOTAL, FREET4, T3FREE, THYROIDAB in the last 72 hours. Anemia Panel: No results for input(s):  VITAMINB12, FOLATE, FERRITIN, TIBC, IRON, RETICCTPCT in the last 72 hours. Sepsis Labs: No results for input(s): PROCALCITON, LATICACIDVEN in the last 168 hours.   Recent Results (from the past 240 hour(s))  Resp Panel by RT-PCR (Flu A&B, Covid) Nasopharyngeal Swab     Status: None   Collection Time: 05/01/21  2:39 PM   Specimen: Nasopharyngeal Swab; Nasopharyngeal(NP) swabs in vial transport medium  Result Value Ref Range Status   SARS Coronavirus 2 by RT PCR NEGATIVE NEGATIVE Final    Comment: (NOTE) SARS-CoV-2 target nucleic acids are NOT DETECTED.  The SARS-CoV-2 RNA is generally detectable in upper respiratory specimens during the acute phase of infection. The lowest concentration of SARS-CoV-2 viral  copies this assay can detect is 138 copies/mL. A negative result does not preclude SARS-Cov-2 infection and should not be used as the sole basis for treatment or other patient management decisions. A negative result may occur with  improper specimen collection/handling, submission of specimen other than nasopharyngeal swab, presence of viral mutation(s) within the areas targeted by this assay, and inadequate number of viral copies(<138 copies/mL). A negative result must be combined with clinical observations, patient history, and epidemiological information. The expected result is Negative.  Fact Sheet for Patients:  EntrepreneurPulse.com.au  Fact Sheet for Healthcare Providers:  IncredibleEmployment.be  This test is no t yet approved or cleared by the [place] FDA and  has been authorized for detection and/or diagnosis of SARS-CoV-2 by FDA under an Emergency Use Authorization (EUA). This EUA will remain  in effect (meaning this test can be used) for the duration of the COVID-19 declaration under Section 564(b)(1) of the Act, 21 U.S.C.section 360bbb-3(b)(1), unless the authorization is terminated  or revoked sooner.       Influenza A  by PCR NEGATIVE NEGATIVE Final   Influenza B by PCR NEGATIVE NEGATIVE Final    Comment: (NOTE) The Xpert Xpress SARS-CoV-2/FLU/RSV plus assay is intended as an aid in the diagnosis of influenza from Nasopharyngeal swab specimens and should not be used as a sole basis for treatment. Nasal washings and aspirates are unacceptable for Xpert Xpress SARS-CoV-2/FLU/RSV testing.  Fact Sheet for Patients: EntrepreneurPulse.com.au  Fact Sheet for Healthcare Providers: IncredibleEmployment.be  This test is not yet approved or cleared by the [place] FDA and has been authorized for detection and/or diagnosis of SARS-CoV-2 by FDA under an Emergency Use Authorization (EUA). This EUA will remain in effect (meaning this test can be used) for the duration of the COVID-19 declaration under Section 564(b)(1) of the Act, 21 U.S.C. section 360bbb-3(b)(1), unless the authorization is terminated or revoked.  Performed at [facility], [address] [place], [place] [address]   MRSA Next Gen by PCR, Nasal     Status: None   Collection Time: 05/01/21 10:14 PM   Specimen: Nasal Mucosa; Nasal Swab  Result Value Ref Range Status   MRSA by PCR Next Gen NOT DETECTED NOT DETECTED Final    Comment: (NOTE) The GeneXpert MRSA Assay (FDA approved for NASAL specimens only), is one component of a comprehensive MRSA colonization surveillance program. It is not intended to diagnose MRSA infection nor to guide or monitor treatment for MRSA infections. Test performance is not FDA approved in patients less than 2 years old. Performed at [facility], [address] [place], [place] [address]          Radiology Studies: DG UGI W SINGLE CM (SOL OR THIN BA)  Result Date: 05/08/2021 CLINICAL DATA:  Status post Phillip Heal patch, perforated duodenal ulcer EXAM: WATER SOLUBLE UPPER GI SERIES TECHNIQUE: Fluoroscopic single contrast leak check performed using  water soluble contrast. CONTRAST:  125 mL Omnipaque 300 water-soluble iodinated contrast per NG tube. COMPARISON:  None. FLUOROSCOPY TIME:  Fluoroscopy Time:  00:30 Number of Acquired Spot Images: 5 FINDINGS: Nasogastric tube was gently injected with a total of 150 mL dilute Omnipaque water-soluble contrast. Ready opacification of the stomach and proximal duodenum. No evidence of contrast extravasation. IMPRESSION: No fluoroscopic evidence of leak following duodenal ulcer repair. Electronically Signed   By: Eddie Candle M.D.   On: 05/08/2021 13:05        Scheduled Meds:  acetaminophen  1,000 mg Oral Q6H  amLODipine  5 mg Oral Daily   carvedilol  6.25 mg Oral BID   Chlorhexidine Gluconate Cloth  6 each Topical Q0600   enoxaparin (LOVENOX) injection  40 mg Subcutaneous Q24H   feeding supplement  237 mL Oral BID BM   hydrALAZINE  100 mg Oral Daily   insulin aspart  0-9 Units Subcutaneous Q6H   lidocaine  1 patch Transdermal Q24H   losartan  100 mg Oral Daily   methocarbamol  500 mg Oral TID   pantoprazole (PROTONIX) IV  40 mg Intravenous Q12H   sodium chloride flush  10-40 mL Intracatheter Q12H   Continuous Infusions:  TPN ADULT (ION) Stopped (05/10/21 0900)     LOS: 9 days    Time spent:25 mins. More than 50% of that time was spent in counseling and/or coordination of care.      Shelly Coss, MD [facility] P9/04/2021, 11:13 AM

## 2021-05-10 NOTE — Progress Notes (Signed)
Family states MD stated there would be an RX for fioricet for home. Secured chat MD at 1000. At 79 Md has not read message. RN called office [phone] was giving pager number [phone]. Paged Md awaiting response

## 2021-05-10 NOTE — TOC Transition Note (Signed)
Transition of Care [facility]) - CM/SW Discharge Note   Patient Details  Name: Carl [name] MRN: [id number] Date of Birth: 02/15/1946  [facility]) CM/SW Contact:  Shelbie Hutching, RN Phone Number: 05/10/2021, 9:00 AM   Clinical Narrative:    Patient medically cleared for discharge home with home health PT through [facility].  Patient's wife will transport patient home.  Patient has all needed equipment at home.  Corene Cornea with [facility] notified of DC today.   Final next level of care: [place] Barriers to Discharge: Barriers Resolved   Patient Goals and CMS Choice Patient states their goals for this hospitalization and ongoing recovery are:: Patient glad to be going home tomorrow CMS Medicare.gov Compare Post Acute Care list provided to:: Patient Choice offered to / list presented to : Patient  Discharge Placement                       Discharge Plan and Services   Discharge Planning Services: CM Consult Post Acute Care Choice: Home Health          DME Arranged: N/A DME Agency: NA       HH Arranged: PT [place] Agency: [place] ([facility]) Date HH Agency Contacted: 05/10/21 Time HH Agency Contacted: 0900 Representative spoke with at [place]: [place] ([place]) Interventions     Readmission Risk Interventions Readmission Risk Prevention Plan 05/04/2021  Transportation Screening Complete  PCP or Specialist Appt within 3-5 Days Complete  HRI or [place] Complete  Social Work Consult for [place] Planning/Counseling Complete  Palliative Care Screening Not Applicable  Medication Review Press photographer) Complete  Some recent data might be hidden

## 2021-05-10 NOTE — Discharge Summary (Addendum)
[facility]GICAL ASSOCIATES SURGICAL DISCHARGE SUMMARY  Patient ID: Carl [name] MRN: [id number] DOB/AGE: March 29, 1946 75 y.o.  Admit date: 05/01/2021 Discharge date: 05/10/2021  Discharge Diagnoses Patient Active Problem List   Diagnosis Date Noted   Pneumoperitoneum 05/01/2021   Bowel perforation ([name]) 05/01/2021    Consultants Internal Medicine  Procedures 05/01/2021: Robotic assisted laparoscopic Repair of perforated duodenal ulcer with omental flap / Pexy to reinforce closure  HPI: 76 y.o. male presented to [facility] ED today for abdominal pain. Patient reports the acute onset of severe abdominal pain around 11 AM which seems to be worse in the RUQ and radiates to his right shoulder. Pain has been severe and constant in nature. Nothing seems to make this better. Pain worse with ambulation. Some nausea. No fever, chills, CP, SOB, urinary changes, or bowel changes. He does reportedly have a history of perforated ulcer in 2011 but this was managed conservatively. Last EGD was in 2018 with Dr Allen Norris which showed gastric ulceration. He is not on any preventative medications. Does not appear to have any history of NSAID abuse in recent history. He does have a history of alcohol abuse in the past but has not had a drink in "many years" per his wife. Work up in the ED showed a normal WBC at 6.9K, hgb normal at 13.5, slight AKI with sCr - 1.90. CXR was concerning for pneumoperitoneum and follow up CT Abdomen/Pelvis was concerning for similar  Hospital Course: Informed consent was obtained and documented, and patient underwent uneventful Robotic assisted laparoscopic Repair of perforated duodenal ulcer with omental flap / Pexy to reinforce closure (Dr Dahlia Byes, 05/01/2021).  Post-operatively, patient remained NPO with NGT decompression x7 days prior to undergoing UGI which showed patent repair without leak. He was started on TPN in the immediate post operative period. NGT was removed on POD7. [facility] of  patient's diet and ambulation were well-tolerated. The remainder of patient's hospital course was essentially unremarkable, and discharge planning was initiated accordingly with patient safely able to be discharged home with appropriate discharge instructions, pain control, and outpatient follow-up after all of his and his wife's questions were answered to their expressed satisfaction.   Discharge Condition: Good   Physical Examination:  Constitutional: Alert, cooperative and no distress  Respiratory: Breathing non-labored at rest, on [place] Cardiovascular: Regular rate and sinus rhythm  Gastrointestinal: Soft, incisional soreness, non-distended, no rebound/guarding. Surgical drain in LLQ with serous output (removed prior to DC) Integumentary: Laparoscopic incisions are CDI with gauze and tegaderm     Allergies as of 05/10/2021       Reactions   Aspirin Other (See Comments)   Other reaction(s): Other (See Comments) He gets GI bleed.     Nsaids Other (See Comments)   Gi bleed   Ace Inhibitors Other (See Comments)   unknown   Lisinopril Other (See Comments)   unknown   Hydrochlorothiazide Other (See Comments)   Gout   Ramipril Other (See Comments)   unknown        Medication List     TAKE these medications    acetaminophen 500 MG tablet Commonly known as: TYLENOL Take 500-1,000 mg by mouth every 6 (six) hours as needed for mild pain, moderate pain or fever.   amLODipine 10 MG tablet Commonly known as: NORVASC Take 0.5 tablets (5 mg total) by mouth daily.   atorvastatin 10 MG tablet Commonly known as: LIPITOR Take 10 mg by mouth every evening.   carvedilol 6.25 MG tablet Commonly known as: COREG  Take 6.25 mg by mouth 2 (two) times daily.   Cholecalciferol 25 MCG (1000 UT) tablet Take 1,000 Units by mouth daily.   doxazosin 8 MG tablet Commonly known as: CARDURA Take 8 mg by mouth at bedtime.   hydrALAZINE 100 MG tablet Commonly known as: APRESOLINE Take 100 mg  by mouth daily.   losartan 100 MG tablet Commonly known as: COZAAR Take 100 mg by mouth daily.   methocarbamol 500 MG tablet Commonly known as: ROBAXIN Take 1 tablet (500 mg total) by mouth 3 (three) times daily.   nystatin powder Commonly known as: MYCOSTATIN/NYSTOP Apply 1 application topically 3 (three) times daily.   oxyCODONE 5 MG immediate release tablet Commonly known as: Oxy IR/ROXICODONE Take 1 tablet (5 mg total) by mouth every 6 (six) hours as needed for severe pain or breakthrough pain.   pantoprazole 40 MG tablet Commonly known as: PROTONIX Take 1 tablet (40 mg total) by mouth 2 (two) times daily.   potassium chloride SA 20 MEQ tablet Commonly known as: KLOR-CON Take 20 mEq by mouth daily.   SUMAtriptan 50 MG tablet Commonly known as: IMITREX Take 1 tablet (50 mg total) by mouth every 2 (two) hours as needed for migraine or headache. May repeat in 2 hours if headache persists or recurs.          Follow-up Information     [place], [place] F, MD. Schedule an appointment as soon as possible for a visit in 1 week(s).   Specialty: General Surgery Why: s/p perforated duodenal ulcer repair Contact information: [address] [place] [address] [phone]         [facility] Follow up.   Specialty: Home Health Services Why: [facility] will be calling to arrange your inital visit.                 Time spent on discharge management including discussion of hospital course, clinical condition, outpatient instructions, prescriptions, and follow up with the patient and members of the medical team: >30 minutes  -- Edison Simon , PA-C [facility]  05/10/2021, 10:46 AM [phone] M-F: 7am - 4pm

## 2021-05-10 NOTE — Discharge Instructions (Signed)
In addition to included general post-operative instructions,  Diet: Resume home diet.   Activity: No heavy lifting >20 pounds (children, pets, laundry, garbage) or strenuous activity for 4 weeks from date of surgery, but light activity and walking are encouraged. Do not drive or drink alcohol if taking narcotic pain medications or having pain that might distract from driving.  Wound care: You may shower/get incision wet with soapy water and pat dry (do not rub incisions), but no baths or submerging incision underwater until follow-up.   Medications: Resume all home medications. For mild to moderate pain: acetaminophen (Tylenol). Combining Tylenol with alcohol can substantially increase your risk of causing liver disease. Narcotic pain medications, if prescribed, can be used for severe pain, though may cause nausea, constipation, and drowsiness. Do not combine Tylenol and Percocet (or similar) within a 6 hour period as Percocet (and similar) contain(s) Tylenol. If you do not need the narcotic pain medication, you do not need to fill the prescription. AVOID NSAIDs (Ibuprofen, Motrin, Advil)  Call office [phone] / [phone]) at any time if any questions, worsening pain, fevers/chills, bleeding, drainage from incision site, or other concerns.

## 2021-05-10 NOTE — Progress Notes (Signed)
[facility] noted PICC line removal order. Contacted patient's nurse Melissa regarding patient currently receiving TPN; she reported she will dc PICC once TPN is completed. No further needs at this time.

## 2021-05-10 NOTE — Care Management Important Message (Signed)
Important Message  Patient Details  Name: LOYS [name] MRN: [id number] Date of Birth: 11/11/1945   Medicare Important Message Given:  Yes     Carl [name] 05/10/2021, 11:10 AM

## 2021-05-16 ENCOUNTER — Encounter: Payer: Self-pay | Admitting: Surgery

## 2021-05-16 ENCOUNTER — Other Ambulatory Visit: Payer: Self-pay

## 2021-05-16 ENCOUNTER — Ambulatory Visit (INDEPENDENT_AMBULATORY_CARE_PROVIDER_SITE_OTHER): Payer: Medicare Other | Admitting: Surgery

## 2021-05-16 VITALS — BP 122/75 | HR 77 | Temp 97.6°F | Ht 67.0 in | Wt 181.8 lb

## 2021-05-16 DIAGNOSIS — Z139 Encounter for screening, unspecified: Secondary | ICD-10-CM

## 2021-05-16 MED ORDER — METRONIDAZOLE 500 MG PO TABS: 500.0000 mg | ORAL_TABLET | Freq: Two times a day (BID) | ORAL | 0 refills | Status: DC

## 2021-05-16 MED ORDER — CLARITHROMYCIN 250 MG PO TABS: 250.0000 mg | ORAL_TABLET | Freq: Two times a day (BID) | ORAL | 0 refills | Status: DC

## 2021-05-16 NOTE — Patient Instructions (Signed)
Please pick up your medications at the pharmacy.  Referral sent to [facility]. Someone from their office will contact you to schedule an appointment.

## 2021-05-17 ENCOUNTER — Telehealth: Payer: Self-pay | Admitting: *Deleted

## 2021-05-17 MED ORDER — TETRACYCLINE HCL 250 MG PO CAPS: 250.0000 mg | ORAL_CAPSULE | Freq: Four times a day (QID) | ORAL | 0 refills | Status: AC

## 2021-05-17 MED ORDER — METRONIDAZOLE 500 MG PO TABS: 500.0000 mg | ORAL_TABLET | Freq: Four times a day (QID) | ORAL | 0 refills | Status: AC

## 2021-05-17 NOTE — Telephone Encounter (Signed)
Per Dr Dahlia Byes may confer with the Pharmacist about the best course of treatment that will not interfere with the patient's medication.   We will keep him on the Flagyl and discontinue the Clarithromycin. We will start him on Tetracycline.   Patient notified of change in treatment.

## 2021-05-17 NOTE — Telephone Encounter (Signed)
Patients pharmacy called ([facility] in [place]) and stated that they received a prescription for Clarithromycin which can have a interaction with amlodipine. The pharmacist would like a call back to see what they need to do.  Tristin- [phone]

## 2021-05-17 NOTE — Progress Notes (Signed)
Status post robotic repair of perforated ulcer.  He is doing well.  Still has some chronic headaches.  He is taking p.o.  He is ambulating.  PE NAD Abd: soft , minimal appropriate incisional tenderness.  Open wound periumbilical area healing well without infection.  No peritonitis  A/P doing well Start h pylori empiric rx Will need EGD in 6-8 weeks No surgical complications

## 2021-05-21 ENCOUNTER — Encounter: Payer: Medicare Other | Admitting: Surgery

## 2021-05-29 ENCOUNTER — Ambulatory Visit (INDEPENDENT_AMBULATORY_CARE_PROVIDER_SITE_OTHER): Payer: Medicare Other | Admitting: Physician Assistant

## 2021-05-29 ENCOUNTER — Encounter: Payer: Self-pay | Admitting: Physician Assistant

## 2021-05-29 ENCOUNTER — Other Ambulatory Visit: Payer: Self-pay

## 2021-05-29 VITALS — BP 104/75 | HR 109 | Temp 98.3°F | Ht 67.0 in | Wt 183.0 lb

## 2021-05-29 DIAGNOSIS — Z09 Encounter for follow-up examination after completed treatment for conditions other than malignant neoplasm: Secondary | ICD-10-CM

## 2021-05-29 DIAGNOSIS — K631 Perforation of intestine (nontraumatic): Secondary | ICD-10-CM

## 2021-05-29 DIAGNOSIS — K668 Other specified disorders of peritoneum: Secondary | ICD-10-CM

## 2021-05-29 NOTE — Progress Notes (Signed)
[facility] POST-OP OFFICE VISIT  05/29/2021  HPI: Carl [name] is a 75 y.o. male 27 days s/p robotic assisted repair of perforated duodenal ulcer with omental patch with Dr Dahlia Byes.   Since his last visit, he continues to do well. Still with very mild lower abdominal pain. No fever, chills, nausea, emesis, or bowel changes. He has completed his H. pylori treatment. Tolerating PO without issues. He has an appointment with Dr Allen Norris on 11/7 for EGD planning.   Continues to have headaches, using tylenol and sumatriptan. He has not followed up with his PCP for these.   Vital signs: BP 104/75   Pulse (!) 109   Temp 98.3 F (36.8 C)   Ht 5\' 7"  (1.702 m)   Wt 183 lb (83 kg)   SpO2 95%   BMI 28.66 kg/m    Physical Exam: Constitutional: Well appearing male, NAD Abdomen: Soft, non-tender, non-distended, no rebound/guarding Skin: Laparoscopic incisions are well healed  Assessment/Plan: This is a 75 y.o. male 27 days s/p robotic assisted repair of perforated duodenal ulcer with omental patch   - Encouraged him, and his wife, to follow up with PCP in regards to his headaches.   - Pain control prn  - Reviewed lifting restrictions; he will complete his 4 weeks tomorrow  - Continue PPI   - Follow up on 11/7 with Dr Allen Norris for EGD planning  - He can follow up with [place] at the end of the year for recheck   -- Edison Simon, PA-C [facility] 05/29/2021, 2:25 PM [phone] M-F: 7am - 4pm

## 2021-07-09 ENCOUNTER — Other Ambulatory Visit: Payer: Self-pay

## 2021-07-09 ENCOUNTER — Encounter: Payer: Self-pay | Admitting: Gastroenterology

## 2021-07-09 ENCOUNTER — Ambulatory Visit (INDEPENDENT_AMBULATORY_CARE_PROVIDER_SITE_OTHER): Payer: Medicare Other | Admitting: Gastroenterology

## 2021-07-09 VITALS — BP 133/80 | HR 70 | Temp 97.8°F | Ht 67.0 in | Wt 179.0 lb

## 2021-07-09 DIAGNOSIS — K219 Gastro-esophageal reflux disease without esophagitis: Secondary | ICD-10-CM

## 2021-07-09 DIAGNOSIS — K269 Duodenal ulcer, unspecified as acute or chronic, without hemorrhage or perforation: Secondary | ICD-10-CM

## 2021-07-09 NOTE — Progress Notes (Signed)
Gastroenterology Consultation  Referring Provider:     Jules Husbands, MD Primary Care Physician:  [facility] Primary Gastroenterologist:  Dr. Allen Norris Reason for Consultation:     PUD and need for screening colonoscopy        HPI:   Carl [name] is a 75 y.o. y/o male referred for consultation & management of Peptic ulcer disease by Dr. Shari Prows, [facility].  This patient comes in today after seeing me in the past for an upper endoscopy with a gastric ulcer found. That was back in October 2018. The patient had a colonoscopy the next year by Dr. Vira Agar which at that time was recommended to have a repeat colonoscopy in 5 years. The patient was recently seen by surgery after having some right lower quadrant abdominal pain and was found to have a  ulcer. The ulcer was a duodenal ulcer with perforation and was repaired with an omental flap.  The patient reports that he has no further abdominal pain and is doing well at the present time.  He denies any black stools or bloody stools.  He also reports that he was treated for H. Pylori.  Past Medical History:  Diagnosis Date   Adenoma of colon    Alcoholism ([place])    still drinking wine as of Feb 2013   Anxiety and depression    Cataract    Cervical spondylosis without myelopathy    Cervicalgia of occipito-atlanto-axial region    Chronic pain disorder    Degeneration of cervical intervertebral disc    Degeneration, intervertebral disc, cervical    Duodenal ulcer 2011   Facet arthropathy, cervical    Fuchs' corneal dystrophy    Gastric ulcer 2011   egd at [place] Aug 2011.   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    LVH (left ventricular hypertrophy)    Mild cognitive impairment    Mitral insufficiency    Neck pain    Neuralgia and neuritis    Nocturia    Occipital neuralgia     Osteoarthritis    neck   Osteoarthritis    Pure hypercholesterolemia    Radiculitis    Stroke ([place])    aphasia, slight right sided weakness   Tricuspid insufficiency     Past Surgical History:  Procedure Laterality Date   COLONOSCOPY WITH PROPOFOL N/A 06/05/2018   Procedure: COLONOSCOPY WITH PROPOFOL;  Surgeon: Manya Silvas, MD;  Location: [facility] ENDOSCOPY;  Service: Endoscopy;  Laterality: N/A;   CYSTOGRAM  03/08   ESOPHAGOGASTRODUODENOSCOPY (EGD) WITH PROPOFOL N/A 06/20/2017   Procedure: ESOPHAGOGASTRODUODENOSCOPY (EGD) WITH PROPOFOL;  Surgeon: Lucilla Lame, MD;  Location: [facility] ENDOSCOPY;  Service: Endoscopy;  Laterality: N/A;   ETHMOIDECTOMY Right 03/01/2020   Procedure: ETHMOIDECTOMY;  Surgeon: Clyde Canterbury, MD;  Location: [facility] ORS;  Service: ENT;  Laterality: Right;   EYE SURGERY Bilateral    cataract   IMAGE GUIDED SINUS SURGERY N/A 03/01/2020   Procedure: IMAGE GUIDED SINUS SURGERY;  Surgeon: Clyde Canterbury, MD;  Location: [facility] ORS;  Service: ENT;  Laterality: N/A;   MAXILLARY ANTROSTOMY Bilateral 03/01/2020   Procedure: MAXILLARY ANTROSTOMY WITH TISSUE;  Surgeon: Clyde Canterbury, MD;  Location: [facility] ORS;  Service: ENT;  Laterality: Bilateral;   REPAIR OF PERFORATED ULCER  05/01/2021   Procedure: ROBOTIC REPAIR OF PERFORATED  DUODENAL ULCER;  Surgeon: Jules Husbands, MD;  Location: [facility] ORS;  Service: General;;   SEPTOPLASTY N/A 03/01/2020   Procedure: SEPTOPLASTY;  Surgeon: Clyde Canterbury, MD;  Location: [facility] ORS;  Service: ENT;  Laterality: N/A;   TURBINATE REDUCTION Bilateral 03/01/2020   Procedure: TURBINATE REDUCTION;  Surgeon: Clyde Canterbury, MD;  Location: [facility] ORS;  Service: ENT;  Laterality: Bilateral;   XI ROBOT ASSISTED DIAGNOSTIC LAPAROSCOPY N/A 05/01/2021   Procedure: XI ROBOT ASSISTED DIAGNOSTIC LAPAROSCOPY;  Surgeon: Jules Husbands, MD;  Location: [facility] ORS;  Service: General;  Laterality: N/A;    Prior to Admission medications   Medication Sig Start Date End Date  Taking? Authorizing Provider  acetaminophen (TYLENOL) 500 MG tablet Take 500-1,000 mg by mouth every 6 (six) hours as needed for mild pain, moderate pain or fever.     [provider]  amLODipine (NORVASC) 10 MG tablet Take 0.5 tablets (5 mg total) by mouth daily. 04/04/19   Loletha Grayer, MD  atorvastatin (LIPITOR) 10 MG tablet Take 10 mg by mouth every evening.     [provider]  carvedilol (COREG) 6.25 MG tablet Take 6.25 mg by mouth 2 (two) times daily. 09/28/19   [provider]  Cholecalciferol 25 MCG (1000 UT) tablet Take 1,000 Units by mouth daily.     [provider]  doxazosin (CARDURA) 8 MG tablet Take 8 mg by mouth at bedtime. 11/09/19   [provider]  hydrALAZINE (APRESOLINE) 100 MG tablet Take 100 mg by mouth daily.  09/01/19   [provider]  losartan (COZAAR) 100 MG tablet Take 100 mg by mouth daily. 11/09/19   [provider]  nystatin (MYCOSTATIN/NYSTOP) powder Apply 1 application topically 3 (three) times daily. 12/13/20   Zara Council A, PA-C  potassium chloride SA (KLOR-CON) 20 MEQ tablet Take 20 mEq by mouth daily. 09/13/19   [provider]  SUMAtriptan (IMITREX) 50 MG tablet Take 1 tablet (50 mg total) by mouth every 2 (two) hours as needed for migraine or headache. May repeat in 2 hours if headache persists or recurs. Patient not taking: Reported on 07/09/2021 05/10/21   Tylene Fantasia, PA-C    Family History  Problem Relation Age of Onset   Fibromyalgia Mother    Hypertension Father    Diabetes Neg Hx      Social History   Tobacco Use   Smoking status: Former    Packs/day: 1.00    Years: 30.00    Pack years: 30.00    Types: Cigarettes    Quit date: 09/02/1978    Years since quitting: 42.8   Smokeless tobacco: Never  Vaping Use   Vaping Use: Never used  Substance Use Topics   Alcohol use: No   Drug use: No    Allergies as of 07/09/2021 - Review Complete 07/09/2021  Allergen  Reaction Noted   Aspirin Other (See Comments) 03/17/2018   Nsaids Other (See Comments) 12/06/2016   Ace inhibitors Other (See Comments) 12/06/2016   Lisinopril Other (See Comments) 12/06/2016   Hydrochlorothiazide Other (See Comments) 07/16/2005   Ramipril Other (See Comments)     Review of Systems:    All systems reviewed and negative except where noted in HPI.   Physical Exam:  BP 133/80 (BP Location: Left Arm, Patient Position: Sitting, Cuff Size: Large)   Pulse 70   Temp 97.8 F (36.6 C) (Temporal)   Ht 5\' 7"  (1.702 m)   Wt 179 lb (81.2 kg)   BMI  28.04 kg/m  No LMP for male patient. General:   Alert,  Well-developed, well-nourished, pleasant and cooperative in NAD Head:  Normocephalic and atraumatic. Eyes:  Sclera clear, no icterus.   Conjunctiva pink. Ears:  Normal auditory acuity. Neck:  Supple; no masses or thyromegaly. Lungs:  Respirations even and unlabored.  Clear throughout to auscultation.   No wheezes, crackles, or rhonchi. No acute distress. Heart:  Regular rate and rhythm; no murmurs, clicks, rubs, or gallops. Abdomen:  Normal bowel sounds.  No bruits.  Soft, non-tender and non-distended without masses, hepatosplenomegaly or hernias noted.  No guarding or rebound tenderness.  Negative Carnett sign.   Rectal:  Deferred.  Pulses:  Normal pulses noted. Extremities:  No clubbing or edema.  No cyanosis. Neurologic:  Alert and oriented x3;  grossly normal neurologically. Skin:  Intact without significant lesions or rashes.  No jaundice. Lymph Nodes:  No significant cervical adenopathy. Psych:  Alert and cooperative. Normal mood and affect.  Imaging Studies: No results found.  Assessment and Plan:   LEOPOLDO MAZZIE is a 75 y.o. y/o male who comes in today with a history of a perforated ulcer back in September of this year likely due to NSAIDs.  The patient was also treated for H. Pylori. The patient was sent over for a screening colonoscopy but the patient had a  colonoscopy 3 years ago and was recommended to have a repeat colonoscopy in 5 years.  The patient has had no lower GI symptoms and does not need a colonoscopy at this time.  Due to the patient's history of peptic ulcer disease and perforation a upper endoscopy will be set up to rule out any malignancy as the cause of his peptic ulcer disease.  The patient has been explained the plan and agrees with it.    Lucilla Lame, MD. Marval Regal    Note: This dictation was prepared with Dragon dictation along with smaller phrase technology. Any transcriptional errors that result from this process are unintentional.

## 2021-07-09 NOTE — H&P (View-Only) (Signed)
Gastroenterology Consultation  Referring Provider:     Jules Husbands, MD Primary Care Physician:  [facility] Primary Gastroenterologist:  Dr. Allen Norris Reason for Consultation:     PUD and need for screening colonoscopy        HPI:   Carl [name] is a 75 y.o. y/o male referred for consultation & management of Peptic ulcer disease by Dr. Shari Prows, [facility].  This patient comes in today after seeing me in the past for an upper endoscopy with a gastric ulcer found. That was back in October 2018. The patient had a colonoscopy the next year by Dr. Vira Agar which at that time was recommended to have a repeat colonoscopy in 5 years. The patient was recently seen by surgery after having some right lower quadrant abdominal pain and was found to have a  ulcer. The ulcer was a duodenal ulcer with perforation and was repaired with an omental flap.  The patient reports that he has no further abdominal pain and is doing well at the present time.  He denies any black stools or bloody stools.  He also reports that he was treated for H. Pylori.  Past Medical History:  Diagnosis Date   Adenoma of colon    Alcoholism ([place])    still drinking wine as of Feb 2013   Anxiety and depression    Cataract    Cervical spondylosis without myelopathy    Cervicalgia of occipito-atlanto-axial region    Chronic pain disorder    Degeneration of cervical intervertebral disc    Degeneration, intervertebral disc, cervical    Duodenal ulcer 2011   Facet arthropathy, cervical    Fuchs' corneal dystrophy    Gastric ulcer 2011   egd at [place] Aug 2011.   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    LVH (left ventricular hypertrophy)    Mild cognitive impairment    Mitral insufficiency    Neck pain    Neuralgia and neuritis    Nocturia    Occipital neuralgia     Osteoarthritis    neck   Osteoarthritis    Pure hypercholesterolemia    Radiculitis    Stroke ([place])    aphasia, slight right sided weakness   Tricuspid insufficiency     Past Surgical History:  Procedure Laterality Date   COLONOSCOPY WITH PROPOFOL N/A 06/05/2018   Procedure: COLONOSCOPY WITH PROPOFOL;  Surgeon: Manya Silvas, MD;  Location: [facility] ENDOSCOPY;  Service: Endoscopy;  Laterality: N/A;   CYSTOGRAM  03/08   ESOPHAGOGASTRODUODENOSCOPY (EGD) WITH PROPOFOL N/A 06/20/2017   Procedure: ESOPHAGOGASTRODUODENOSCOPY (EGD) WITH PROPOFOL;  Surgeon: Lucilla Lame, MD;  Location: [facility] ENDOSCOPY;  Service: Endoscopy;  Laterality: N/A;   ETHMOIDECTOMY Right 03/01/2020   Procedure: ETHMOIDECTOMY;  Surgeon: Clyde Canterbury, MD;  Location: [facility] ORS;  Service: ENT;  Laterality: Right;   EYE SURGERY Bilateral    cataract   IMAGE GUIDED SINUS SURGERY N/A 03/01/2020   Procedure: IMAGE GUIDED SINUS SURGERY;  Surgeon: Clyde Canterbury, MD;  Location: [facility] ORS;  Service: ENT;  Laterality: N/A;   MAXILLARY ANTROSTOMY Bilateral 03/01/2020   Procedure: MAXILLARY ANTROSTOMY WITH TISSUE;  Surgeon: Clyde Canterbury, MD;  Location: [facility] ORS;  Service: ENT;  Laterality: Bilateral;   REPAIR OF PERFORATED ULCER  05/01/2021   Procedure: ROBOTIC REPAIR OF PERFORATED  DUODENAL ULCER;  Surgeon: Jules Husbands, MD;  Location: [facility] ORS;  Service: General;;   SEPTOPLASTY N/A 03/01/2020   Procedure: SEPTOPLASTY;  Surgeon: Clyde Canterbury, MD;  Location: [facility] ORS;  Service: ENT;  Laterality: N/A;   TURBINATE REDUCTION Bilateral 03/01/2020   Procedure: TURBINATE REDUCTION;  Surgeon: Clyde Canterbury, MD;  Location: [facility] ORS;  Service: ENT;  Laterality: Bilateral;   XI ROBOT ASSISTED DIAGNOSTIC LAPAROSCOPY N/A 05/01/2021   Procedure: XI ROBOT ASSISTED DIAGNOSTIC LAPAROSCOPY;  Surgeon: Jules Husbands, MD;  Location: [facility] ORS;  Service: General;  Laterality: N/A;    Prior to Admission medications   Medication Sig Start Date End Date  Taking? Authorizing Provider  acetaminophen (TYLENOL) 500 MG tablet Take 500-1,000 mg by mouth every 6 (six) hours as needed for mild pain, moderate pain or fever.     [provider]  amLODipine (NORVASC) 10 MG tablet Take 0.5 tablets (5 mg total) by mouth daily. 04/04/19   Loletha Grayer, MD  atorvastatin (LIPITOR) 10 MG tablet Take 10 mg by mouth every evening.     [provider]  carvedilol (COREG) 6.25 MG tablet Take 6.25 mg by mouth 2 (two) times daily. 09/28/19   [provider]  Cholecalciferol 25 MCG (1000 UT) tablet Take 1,000 Units by mouth daily.     [provider]  doxazosin (CARDURA) 8 MG tablet Take 8 mg by mouth at bedtime. 11/09/19   [provider]  hydrALAZINE (APRESOLINE) 100 MG tablet Take 100 mg by mouth daily.  09/01/19   [provider]  losartan (COZAAR) 100 MG tablet Take 100 mg by mouth daily. 11/09/19   [provider]  nystatin (MYCOSTATIN/NYSTOP) powder Apply 1 application topically 3 (three) times daily. 12/13/20   Zara Council A, PA-C  potassium chloride SA (KLOR-CON) 20 MEQ tablet Take 20 mEq by mouth daily. 09/13/19   [provider]  SUMAtriptan (IMITREX) 50 MG tablet Take 1 tablet (50 mg total) by mouth every 2 (two) hours as needed for migraine or headache. May repeat in 2 hours if headache persists or recurs. Patient not taking: Reported on 07/09/2021 05/10/21   Tylene Fantasia, PA-C    Family History  Problem Relation Age of Onset   Fibromyalgia Mother    Hypertension Father    Diabetes Neg Hx      Social History   Tobacco Use   Smoking status: Former    Packs/day: 1.00    Years: 30.00    Pack years: 30.00    Types: Cigarettes    Quit date: 09/02/1978    Years since quitting: 42.8   Smokeless tobacco: Never  Vaping Use   Vaping Use: Never used  Substance Use Topics   Alcohol use: No   Drug use: No    Allergies as of 07/09/2021 - Review Complete 07/09/2021  Allergen  Reaction Noted   Aspirin Other (See Comments) 03/17/2018   Nsaids Other (See Comments) 12/06/2016   Ace inhibitors Other (See Comments) 12/06/2016   Lisinopril Other (See Comments) 12/06/2016   Hydrochlorothiazide Other (See Comments) 07/16/2005   Ramipril Other (See Comments)     Review of Systems:    All systems reviewed and negative except where noted in HPI.   Physical Exam:  BP 133/80 (BP Location: Left Arm, Patient Position: Sitting, Cuff Size: Large)   Pulse 70   Temp 97.8 F (36.6 C) (Temporal)   Ht 5\' 7"  (1.702 m)   Wt 179 lb (81.2 kg)   BMI  28.04 kg/m  No LMP for male patient. General:   Alert,  Well-developed, well-nourished, pleasant and cooperative in NAD Head:  Normocephalic and atraumatic. Eyes:  Sclera clear, no icterus.   Conjunctiva pink. Ears:  Normal auditory acuity. Neck:  Supple; no masses or thyromegaly. Lungs:  Respirations even and unlabored.  Clear throughout to auscultation.   No wheezes, crackles, or rhonchi. No acute distress. Heart:  Regular rate and rhythm; no murmurs, clicks, rubs, or gallops. Abdomen:  Normal bowel sounds.  No bruits.  Soft, non-tender and non-distended without masses, hepatosplenomegaly or hernias noted.  No guarding or rebound tenderness.  Negative Carnett sign.   Rectal:  Deferred.  Pulses:  Normal pulses noted. Extremities:  No clubbing or edema.  No cyanosis. Neurologic:  Alert and oriented x3;  grossly normal neurologically. Skin:  Intact without significant lesions or rashes.  No jaundice. Lymph Nodes:  No significant cervical adenopathy. Psych:  Alert and cooperative. Normal mood and affect.  Imaging Studies: No results found.  Assessment and Plan:   Carl [name] is a 75 y.o. y/o male who comes in today with a history of a perforated ulcer back in September of this year likely due to NSAIDs.  The patient was also treated for H. Pylori. The patient was sent over for a screening colonoscopy but the patient had a  colonoscopy 3 years ago and was recommended to have a repeat colonoscopy in 5 years.  The patient has had no lower GI symptoms and does not need a colonoscopy at this time.  Due to the patient's history of peptic ulcer disease and perforation a upper endoscopy will be set up to rule out any malignancy as the cause of his peptic ulcer disease.  The patient has been explained the plan and agrees with it.    Lucilla Lame, MD. Marval Regal    Note: This dictation was prepared with Dragon dictation along with smaller phrase technology. Any transcriptional errors that result from this process are unintentional.

## 2021-07-10 ENCOUNTER — Encounter: Payer: Self-pay | Admitting: Gastroenterology

## 2021-07-12 ENCOUNTER — Ambulatory Visit
Admission: RE | Admit: 2021-07-12 | Discharge: 2021-07-12 | Disposition: A | Discharge status: Home / Self Care | Payer: Medicare Other | Attending: Gastroenterology | Admitting: Gastroenterology

## 2021-07-12 ENCOUNTER — Other Ambulatory Visit: Payer: Self-pay

## 2021-07-12 ENCOUNTER — Encounter
Admission: RE | Disposition: A | Discharge status: Home / Self Care | Payer: Self-pay | Source: Home / Self Care | Attending: Gastroenterology

## 2021-07-12 ENCOUNTER — Ambulatory Visit: Payer: Medicare Other | Admitting: Anesthesiology

## 2021-07-12 DIAGNOSIS — T183XXA Foreign body in small intestine, initial encounter: Secondary | ICD-10-CM

## 2021-07-12 DIAGNOSIS — X58XXXA Exposure to other specified factors, initial encounter: Secondary | ICD-10-CM | POA: Diagnosis not present

## 2021-07-12 DIAGNOSIS — Z87891 Personal history of nicotine dependence: Secondary | ICD-10-CM | POA: Diagnosis not present

## 2021-07-12 DIAGNOSIS — K2950 Unspecified chronic gastritis without bleeding: Secondary | ICD-10-CM | POA: Insufficient documentation

## 2021-07-12 DIAGNOSIS — K279 Peptic ulcer, site unspecified, unspecified as acute or chronic, without hemorrhage or perforation: Secondary | ICD-10-CM

## 2021-07-12 DIAGNOSIS — Z09 Encounter for follow-up examination after completed treatment for conditions other than malignant neoplasm: Secondary | ICD-10-CM | POA: Insufficient documentation

## 2021-07-12 DIAGNOSIS — K219 Gastro-esophageal reflux disease without esophagitis: Secondary | ICD-10-CM

## 2021-07-12 HISTORY — PX: ESOPHAGOGASTRODUODENOSCOPY (EGD) WITH PROPOFOL: SHX5813

## 2021-07-12 HISTORY — DX: Motion sickness, initial encounter: T75.3XXA

## 2021-07-12 SURGERY — ESOPHAGOGASTRODUODENOSCOPY (EGD) WITH PROPOFOL
Anesthesia: General

## 2021-07-12 MED ORDER — LIDOCAINE HCL (CARDIAC) PF 100 MG/5ML IV SOSY
PREFILLED_SYRINGE | INTRAVENOUS | Status: DC | PRN
  Administered 2021-07-12: 30 mg via INTRAVENOUS

## 2021-07-12 MED ORDER — STERILE WATER FOR IRRIGATION IR SOLN
Status: DC | PRN
  Administered 2021-07-12: 200 mL

## 2021-07-12 MED ORDER — ACETAMINOPHEN 325 MG PO TABS: 325.0000 mg | ORAL_TABLET | ORAL | Status: DC | PRN

## 2021-07-12 MED ORDER — LACTATED RINGERS IV SOLN: INTRAVENOUS | Status: DC

## 2021-07-12 MED ORDER — SODIUM CHLORIDE 0.9 % IV SOLN: INTRAVENOUS | Status: DC

## 2021-07-12 MED ORDER — ONDANSETRON HCL 4 MG/2ML IJ SOLN: 4.0000 mg | Freq: Once | INTRAMUSCULAR | Status: DC | PRN

## 2021-07-12 MED ORDER — GLYCOPYRROLATE 0.2 MG/ML IJ SOLN
INTRAMUSCULAR | Status: DC | PRN
  Administered 2021-07-12: .1 mg via INTRAVENOUS

## 2021-07-12 MED ORDER — ACETAMINOPHEN 160 MG/5ML PO SOLN: 325.0000 mg | ORAL | Status: DC | PRN

## 2021-07-12 MED ORDER — PROPOFOL 10 MG/ML IV BOLUS
INTRAVENOUS | Status: DC | PRN
  Administered 2021-07-12: 100 mg via INTRAVENOUS
  Administered 2021-07-12: 20 mg via INTRAVENOUS
  Administered 2021-07-12 (×2): 40 mg via INTRAVENOUS

## 2021-07-12 SURGICAL SUPPLY — 34 items
BALLN DILATOR 10-12 8 (BALLOONS)
BALLN DILATOR 12-15 8 (BALLOONS)
BALLN DILATOR 15-18 8 (BALLOONS)
BALLN DILATOR CRE 0-12 8 (BALLOONS)
BALLN DILATOR ESOPH 8 10 CRE (MISCELLANEOUS) IMPLANT
BALLOON DILATOR 12-15 8 (BALLOONS) IMPLANT
BALLOON DILATOR 15-18 8 (BALLOONS) IMPLANT
BALLOON DILATOR CRE 0-12 8 (BALLOONS) IMPLANT
BLOCK BITE 60FR ADLT L/F GRN (MISCELLANEOUS) ×2 IMPLANT
CLIP HMST 235XBRD CATH ROT (MISCELLANEOUS) IMPLANT
CLIP RESOLUTION 360 11X235 (MISCELLANEOUS)
ELECT REM PT RETURN 9FT ADLT (ELECTROSURGICAL)
ELECTRODE REM PT RTRN 9FT ADLT (ELECTROSURGICAL) IMPLANT
FCP ESCP3.2XJMB 240X2.8X (MISCELLANEOUS)
FORCEPS BIOP RAD 4 LRG CAP 4 (CUTTING FORCEPS) ×2 IMPLANT
FORCEPS BIOP RJ4 240 W/NDL (MISCELLANEOUS)
FORCEPS ESCP3.2XJMB 240X2.8X (MISCELLANEOUS) IMPLANT
GOWN CVR UNV OPN BCK APRN NK (MISCELLANEOUS) ×2 IMPLANT
GOWN ISOL THUMB LOOP REG UNIV (MISCELLANEOUS) ×4
INJECTOR VARIJECT VIN23 (MISCELLANEOUS) IMPLANT
KIT DEFENDO VALVE AND CONN (KITS) IMPLANT
KIT PRC NS LF DISP ENDO (KITS) ×1 IMPLANT
KIT PROCEDURE OLYMPUS (KITS) ×2
MANIFOLD NEPTUNE II (INSTRUMENTS) ×2 IMPLANT
MARKER SPOT ENDO TATTOO 5ML (MISCELLANEOUS) IMPLANT
RETRIEVER NET PLAT FOOD (MISCELLANEOUS) IMPLANT
SNARE SHORT THROW 13M SML OVAL (MISCELLANEOUS) IMPLANT
SNARE SHORT THROW 30M LRG OVAL (MISCELLANEOUS) IMPLANT
SPOT EX ENDOSCOPIC TATTOO (MISCELLANEOUS)
SYR INFLATION 60ML (SYRINGE) IMPLANT
TRAP ETRAP POLY (MISCELLANEOUS) IMPLANT
VARIJECT INJECTOR VIN23 (MISCELLANEOUS)
WATER STERILE IRR 250ML POUR (IV SOLUTION) ×2 IMPLANT
WIRE CRE 18-20MM 8CM F G (MISCELLANEOUS) IMPLANT

## 2021-07-12 NOTE — Op Note (Signed)
[facility] Gastroenterology Patient Name: Carl [name] Procedure Date: 07/12/2021 10:39 AM MRN: [id number] Account #: [id number] Date of Birth: August 07, 1946 Admit Type: Outpatient Age: 75 Room: [facility] OR ROOM 01 Gender: Male Note Status: Finalized Instrument Name: [id number] Procedure:             Upper GI endoscopy Indications:           Follow-up of peptic ulcer Providers:             Lucilla Lame MD, MD Medicines:             Propofol per Anesthesia Complications:         No immediate complications. Procedure:             Pre-Anesthesia Assessment:                        - Prior to the procedure, a History and Physical was                         performed, and patient medications and allergies were                         reviewed. The patient's tolerance of previous                         anesthesia was also reviewed. The risks and benefits                         of the procedure and the sedation options and risks                         were discussed with the patient. All questions were                         answered, and informed consent was obtained. Prior                         Anticoagulants: The patient has taken no previous                         anticoagulant or antiplatelet agents. ASA Grade                         Assessment: III - A patient with severe systemic                         disease. After reviewing the risks and benefits, the                         patient was deemed in satisfactory condition to                         undergo the procedure.                        After obtaining informed consent, the endoscope was                         passed under direct vision. Throughout the procedure,  the patient's blood pressure, pulse, and oxygen                         saturations were monitored continuously. The Endoscope                         was introduced through the mouth, and advanced to the                          second part of duodenum. The upper GI endoscopy was                         accomplished without difficulty. The patient tolerated                         the procedure well. Findings:      The examined esophagus was normal.      The entire examined stomach was normal. Biopsies were taken with a cold       forceps for histology.      A foreign body was found in the duodenal bulb. Removal of stutures was       accomplished with a regular forceps. Impression:            - Normal esophagus.                        - Normal stomach. Biopsied.                        - Duodenal foreign body. Removal was successful. Recommendation:        - Discharge patient to home.                        - Resume previous diet.                        - Continue present medications.                        - Await pathology results. Procedure Code(s):     --- Professional ---                        [phone], Esophagogastroduodenoscopy, flexible,                         transoral; with removal of foreign body(s)                        [id number], Esophagogastroduodenoscopy, flexible,                         transoral; with biopsy, single or multiple Diagnosis Code(s):     --- Professional ---                        K27.9, Peptic ulcer, site unspecified, unspecified as                         acute or chronic, without hemorrhage or perforation  T18.3XXS, Foreign body in small intestine, sequela CPT copyright 2019 American Medical Association. All rights reserved. The codes documented in this report are preliminary and upon coder review may  be revised to meet current compliance requirements. Lucilla Lame MD, MD 07/12/2021 11:08:14 AM This report has been signed electronically. Number of Addenda: 0 Note Initiated On: 07/12/2021 10:39 AM Total Procedure Duration: 0 hours 13 minutes 36 seconds  Estimated Blood Loss:  Estimated blood loss: none.      [facility]

## 2021-07-12 NOTE — Anesthesia Postprocedure Evaluation (Signed)
Anesthesia Post Note  Patient: Carl [name]  Procedure(s) Performed: ESOPHAGOGASTRODUODENOSCOPY (EGD) WITH PROPOFOL     Patient location during evaluation: PACU Anesthesia Type: General Level of consciousness: awake Pain management: pain level controlled Vital Signs Assessment: post-procedure vital signs reviewed and stable Respiratory status: respiratory function stable Cardiovascular status: stable Postop Assessment: no signs of nausea or vomiting Anesthetic complications: no   No notable events documented.  Veda Canning

## 2021-07-12 NOTE — Anesthesia Preprocedure Evaluation (Addendum)
Anesthesia Evaluation  Patient identified by MRN, date of birth, ID band Patient awake    Reviewed: Allergy & Precautions, NPO status   Airway Mallampati: II  TM Distance: >3 FB     Dental   Pulmonary former smoker,    Pulmonary exam normal        Cardiovascular hypertension,  Rhythm:Regular Rate:Normal  TTE 05/05/21 1. Left ventricular ejection fraction, by estimation, is 60 to 65%. The left ventricle has normal function. The left ventricle has no regional wall motion abnormalities. There is mild left ventricular hypertrophy. Left ventricular diastolic parameters are consistent with Grade I diastolic dysfunction (impaired relaxation). The average left ventricular global longitudinal strain is -16.0 %.  2. Right ventricular systolic function is normal. The right ventricular size is not well visualized.  3. The mitral valve is normal in structure. No evidence of mitral valve regurgitation.  4. The aortic valve is tricuspid. Aortic valve regurgitation is not visualized.  5. The inferior vena cava is dilated in size with >50% respiratory variability, suggesting right atrial pressure of 8 mmHg.    Neuro/Psych  Headaches, PSYCHIATRIC DISORDERS Anxiety Depression Hx alcoholism  Hx head injury > 10 years ago with chronic headaches CVA (symptoms of aphasia and right sided weakness)    GI/Hepatic PUD,   Endo/Other    Renal/GU      Musculoskeletal  (+) Arthritis ,   Abdominal   Peds  Hematology   Anesthesia Other Findings   Reproductive/Obstetrics                            Anesthesia Physical Anesthesia Plan  ASA: 3  Anesthesia Plan: General   Post-op Pain Management:    Induction: Intravenous  PONV Risk Score and Plan: Propofol infusion, TIVA and Treatment may vary due to age or medical condition  Airway Management Planned: Natural Airway and Nasal Cannula  Additional Equipment:    Intra-op Plan:   Post-operative Plan:   Informed Consent: I have reviewed the patients History and Physical, chart, labs and discussed the procedure including the risks, benefits and alternatives for the proposed anesthesia with the patient or authorized representative who has indicated his/her understanding and acceptance.       Plan Discussed with: CRNA  Anesthesia Plan Comments:         Anesthesia Quick Evaluation

## 2021-07-12 NOTE — Transfer of Care (Signed)
Immediate Anesthesia Transfer of Care Note  Patient: Carl [name]  Procedure(s) Performed: ESOPHAGOGASTRODUODENOSCOPY (EGD) WITH PROPOFOL  Patient Location: PACU  Anesthesia Type: General  Level of Consciousness: awake, alert  and patient cooperative  Airway and Oxygen Therapy: Patient Spontanous Breathing and Patient connected to supplemental oxygen  Post-op Assessment: Post-op Vital signs reviewed, Patient's Cardiovascular Status Stable, Respiratory Function Stable, Patent Airway and No signs of Nausea or vomiting  Post-op Vital Signs: Reviewed and stable  Complications: No notable events documented.

## 2021-07-12 NOTE — Anesthesia Procedure Notes (Signed)
Date/Time: 07/12/2021 10:48 AM Performed by: Cameron Ali, CRNA Pre-anesthesia Checklist: Patient identified, Emergency Drugs available, Suction available, Timeout performed and Patient being monitored Patient Re-evaluated:Patient Re-evaluated prior to induction Oxygen Delivery Method: Nasal cannula Placement Confirmation: positive ETCO2

## 2021-07-12 NOTE — Interval H&P Note (Signed)
Lucilla Lame, MD [address] [place], [place] [address] Phone:[phone] Fax : [phone]  Primary Care Physician:  [facility] Primary Gastroenterologist:  Dr. Allen Norris  Pre-Procedure History & Physical: HPI:  Carl [name] is a 75 y.o. male is here for an endoscopy.   Past Medical History:  Diagnosis Date   Adenoma of colon    Alcoholism ([place])    still drinking wine as of Feb 2013   Anxiety and depression    Cataract    Cervical spondylosis without myelopathy    Cervicalgia of occipito-atlanto-axial region    Chronic pain disorder    Degeneration of cervical intervertebral disc    Degeneration, intervertebral disc, cervical    Duodenal ulcer 2011   Facet arthropathy, cervical    Fuchs' corneal dystrophy    Gastric ulcer 2011   egd at [place] Aug 2011.   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, slight right sided weakness   Tricuspid insufficiency     Past Surgical History:  Procedure Laterality Date   COLONOSCOPY WITH PROPOFOL N/A 06/05/2018   Procedure: COLONOSCOPY WITH PROPOFOL;  Surgeon: Manya Silvas, MD;  Location: [facility] ENDOSCOPY;  Service: Endoscopy;  Laterality: N/A;   CYSTOGRAM  03/08   ESOPHAGOGASTRODUODENOSCOPY (EGD) WITH PROPOFOL N/A 06/20/2017   Procedure: ESOPHAGOGASTRODUODENOSCOPY (EGD) WITH PROPOFOL;  Surgeon: Lucilla Lame, MD;  Location: [facility] ENDOSCOPY;  Service: Endoscopy;  Laterality: N/A;   ETHMOIDECTOMY Right 03/01/2020   Procedure: ETHMOIDECTOMY;  Surgeon:  Clyde Canterbury, MD;  Location: [facility] ORS;  Service: ENT;  Laterality: Right;   EYE SURGERY Bilateral    cataract   IMAGE GUIDED SINUS SURGERY N/A 03/01/2020   Procedure: IMAGE GUIDED SINUS SURGERY;  Surgeon: Clyde Canterbury, MD;  Location: [facility] ORS;  Service: ENT;  Laterality: N/A;   MAXILLARY ANTROSTOMY Bilateral 03/01/2020   Procedure: MAXILLARY ANTROSTOMY WITH TISSUE;  Surgeon: Clyde Canterbury, MD;  Location: [facility] ORS;  Service: ENT;  Laterality: Bilateral;   REPAIR OF PERFORATED ULCER  05/01/2021   Procedure: ROBOTIC REPAIR OF PERFORATED DUODENAL ULCER;  Surgeon: Jules Husbands, MD;  Location: [facility] ORS;  Service: General;;   SEPTOPLASTY N/A 03/01/2020   Procedure: SEPTOPLASTY;  Surgeon: Clyde Canterbury, MD;  Location: [facility] ORS;  Service: ENT;  Laterality: N/A;   TURBINATE REDUCTION Bilateral 03/01/2020   Procedure: TURBINATE REDUCTION;  Surgeon: Clyde Canterbury, MD;  Location: [facility] ORS;  Service: ENT;  Laterality: Bilateral;   XI ROBOT ASSISTED DIAGNOSTIC LAPAROSCOPY N/A 05/01/2021   Procedure: XI ROBOT ASSISTED DIAGNOSTIC LAPAROSCOPY;  Surgeon: Jules Husbands, MD;  Location: [facility] ORS;  Service: General;  Laterality: N/A;    Prior to Admission medications   Medication Sig Start Date End Date Taking? Authorizing Provider  acetaminophen (TYLENOL) 500 MG tablet Take 500-1,000 mg by mouth every 6 (six) hours as needed for mild pain, moderate pain or fever.  Yes [provider]  amLODipine (NORVASC) 10 MG tablet Take 0.5 tablets (5 mg total) by mouth daily. 04/04/19  Yes Wieting, Richard, MD  atorvastatin (LIPITOR) 10 MG tablet Take 10 mg by mouth every evening.    Yes [provider]  carvedilol (COREG) 6.25 MG tablet Take 6.25 mg by mouth 2 (two) times daily. 09/28/19  Yes [provider]  Cholecalciferol 25 MCG (1000 UT) tablet Take 1,000 Units by mouth daily.    Yes [provider]  doxazosin (CARDURA) 8 MG tablet Take 8 mg by mouth at bedtime. 11/09/19  Yes [provider]  hydrALAZINE (APRESOLINE) 100 MG tablet Take 100 mg by mouth daily.  09/01/19  Yes [provider]  losartan (COZAAR) 100 MG tablet Take 100 mg by mouth daily. 11/09/19  Yes [provider]  nystatin (MYCOSTATIN/NYSTOP) powder Apply 1 application topically 3 (three) times daily. 12/13/20  Yes McGowan, Larene Beach A, PA-C  potassium chloride SA (KLOR-CON) 20 MEQ tablet Take 20 mEq by mouth daily. 09/13/19  Yes [provider]  SUMAtriptan (IMITREX) 50 MG tablet Take 1 tablet (50 mg total) by mouth every 2 (two) hours as needed for migraine or headache. May repeat in 2 hours if headache persists or recurs. Patient not taking: No sig reported 05/10/21   Edison Simon R, PA-C    Allergies as of 07/09/2021 - Review Complete 07/09/2021  Allergen Reaction Noted   Aspirin Other (See Comments) 03/17/2018   Nsaids Other (See Comments) 12/06/2016   Ace inhibitors Other (See Comments) 12/06/2016   Lisinopril Other (See Comments) 12/06/2016   Hydrochlorothiazide Other (See Comments) 07/16/2005   Ramipril Other (See Comments)     Family History  Problem Relation Age of Onset   Fibromyalgia Mother    Hypertension Father    Diabetes Neg Hx     Social History   Socioeconomic History   Marital status: Married    Spouse name: Not on file   Number of children: 2   Years of education: Not on file   Highest education level: Not on file  Occupational History   Occupation: retired  Tobacco Use   Smoking status: Former    Packs/day: 1.00    Years: 30.00    Pack years: 30.00    Types: Cigarettes    Quit date: 09/03/1991    Years since quitting: 29.8   Smokeless tobacco: Never  Vaping Use   Vaping Use: Never used  Substance and Sexual Activity   Alcohol use: No   Drug use: No   Sexual activity: Never  Other Topics Concern   Not on file  Social History Narrative   Enjoys violin   Social Determinants of Health   Financial Resource Strain: Not on file   Food Insecurity: Not on file  Transportation Needs: Not on file  Physical Activity: Not on file  Stress: Not on file  Social Connections: Not on file  Intimate Partner Violence: Not on file    Review of Systems: See HPI, otherwise negative ROS  Physical Exam: BP 140/89   Pulse (!) 55   Temp 97.6 F (36.4 C) (Temporal)   Resp 20   Ht 5\' 7"  (1.702 m)   Wt 80.7 kg   SpO2 97%   BMI 27.88 kg/m  General:   Alert,  pleasant and cooperative in NAD Head:  Normocephalic and atraumatic. Neck:  Supple; no masses or thyromegaly. Lungs:  Clear throughout to auscultation.    Heart:  Regular rate and  rhythm. Abdomen:  Soft, nontender and nondistended. Normal bowel sounds, without guarding, and without rebound.   Neurologic:  Alert and  oriented x4;  grossly normal neurologically.  Impression/Plan: Carl [name] is here for an endoscopy to be performed for followup for gastric ulcer.  Risks, benefits, limitations, and alternatives regarding  endoscopy have been reviewed with the patient.  Questions have been answered.  All parties agreeable.   Lucilla Lame, MD  07/12/2021, 10:39 AM

## 2021-07-13 ENCOUNTER — Encounter: Payer: Self-pay | Admitting: Gastroenterology

## 2021-07-16 ENCOUNTER — Encounter: Payer: Self-pay | Admitting: Gastroenterology

## 2021-07-16 LAB — SURGICAL PATHOLOGY

## 2021-08-15 ENCOUNTER — Encounter: Payer: Self-pay | Admitting: Surgery

## 2021-08-15 ENCOUNTER — Other Ambulatory Visit: Payer: Self-pay

## 2021-08-15 ENCOUNTER — Ambulatory Visit: Payer: Medicare Other | Admitting: Surgery

## 2021-08-15 VITALS — BP 124/84 | HR 105 | Temp 97.8°F | Ht 67.0 in | Wt 178.4 lb

## 2021-08-15 DIAGNOSIS — K269 Duodenal ulcer, unspecified as acute or chronic, without hemorrhage or perforation: Secondary | ICD-10-CM | POA: Diagnosis not present

## 2021-08-15 NOTE — Patient Instructions (Signed)
   Follow-up with our office as needed.  Please call and ask to speak with a nurse if you develop questions or concerns.  

## 2021-08-18 ENCOUNTER — Encounter: Payer: Self-pay | Admitting: Surgery

## 2021-08-18 NOTE — Progress Notes (Signed)
Outpatient Surgical Follow Up  08/18/2021  Carl [name] is an 75 y.o. male.   Chief Complaint  Patient presents with   Follow-up    Perforated duodenal ulcer-3 mo follow up    HPI: [name] is a 75 year old male 3-1/2 months out from a robotic duodenal ulcer perforation repair.  He is doing very well.  He is symptom-free.  He completed his EGD showing some inflammatory response but significant improvement..  No cancer, no H pylori, He was treated w empiric A/bs.  He does note that I have personally reviewed the pathology report and discussed this with him in detail.  I have also personally reviewed the endoscopic images.  He is eating regular diet and having bowel movements.   Past Medical History:  Diagnosis Date   Adenoma of colon    Alcoholism ([place])    still drinking wine as of Feb 2013   Anxiety and depression    Cataract    Cervical spondylosis without myelopathy    Cervicalgia of occipito-atlanto-axial region    Chronic pain disorder    Degeneration of cervical intervertebral disc    Degeneration, intervertebral disc, cervical    Duodenal ulcer 2011   Facet arthropathy, cervical    Fuchs' corneal dystrophy    Gastric ulcer 2011   egd at [place] Aug 2011.   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    LVH (left ventricular hypertrophy)    Mild cognitive impairment    Mitral insufficiency    Motion sickness    boats   Neck pain    Neuralgia and neuritis    Nocturia    Occipital neuralgia    Osteoarthritis    neck   Osteoarthritis    Pure hypercholesterolemia    Radiculitis    Stroke ([place])    aphasia, slight right sided weakness   Tricuspid insufficiency     Past Surgical History:  Procedure Laterality Date   COLONOSCOPY WITH PROPOFOL N/A 06/05/2018   Procedure: COLONOSCOPY WITH PROPOFOL;  Surgeon: Manya Silvas,  MD;  Location: [facility] ENDOSCOPY;  Service: Endoscopy;  Laterality: N/A;   CYSTOGRAM  03/08   ESOPHAGOGASTRODUODENOSCOPY (EGD) WITH PROPOFOL N/A 06/20/2017   Procedure: ESOPHAGOGASTRODUODENOSCOPY (EGD) WITH PROPOFOL;  Surgeon: Lucilla Lame, MD;  Location: [facility] ENDOSCOPY;  Service: Endoscopy;  Laterality: N/A;   ESOPHAGOGASTRODUODENOSCOPY (EGD) WITH PROPOFOL N/A 07/12/2021   Procedure: ESOPHAGOGASTRODUODENOSCOPY (EGD) WITH PROPOFOL;  Surgeon: Lucilla Lame, MD;  Location: [place];  Service: Endoscopy;  Laterality: N/A;   ETHMOIDECTOMY Right 03/01/2020   Procedure: ETHMOIDECTOMY;  Surgeon: Clyde Canterbury, MD;  Location: [facility] ORS;  Service: ENT;  Laterality: Right;   EYE SURGERY Bilateral    cataract   IMAGE GUIDED SINUS SURGERY N/A 03/01/2020   Procedure: IMAGE GUIDED SINUS SURGERY;  Surgeon: Clyde Canterbury, MD;  Location: [facility] OR[facility];  Service: ENT;  Laterality: N/A;   MAXILLARY ANTROSTOMY Bilateral 03/01/2020   Procedure: MAXILLARY ANTROSTOMY WITH TISSUE;  Surgeon: Clyde Canterbury, MD;  Location: [facility] OR[facility];  Service: ENT;  Laterality: Bilateral;   REPAIR OF PERFORATED ULCER  05/01/2021   Procedure: ROBOTIC REPAIR OF PERFORATED DUODENAL ULCER;  Surgeon: Jules Husbands, MD;  Location: [facility] OR[facility];  Service: General;;   SEPTOPLASTY N/A 03/01/2020   Procedure: SEPTOPLASTY;  Surgeon: Clyde Canterbury, MD;  Location: [facility];  Service: ENT;  Laterality: N/A;   TURBINATE REDUCTION Bilateral 03/01/2020   Procedure: TURBINATE REDUCTION;  Surgeon: Clyde Canterbury, MD;  Location: [facility];  Service: ENT;  Laterality: Bilateral;   XI ROBOT ASSISTED DIAGNOSTIC LAPAROSCOPY N/A 05/01/2021   Procedure: XI ROBOT ASSISTED DIAGNOSTIC LAPAROSCOPY;  Surgeon: Jules Husbands, MD;  Location: [facility] OR[facility];  Service: General;  Laterality: N/A;    Family History  Problem Relation Age of Onset   Fibromyalgia Mother    Hypertension Father    Diabetes Neg Hx     Social History:  reports that he quit smoking about 29 years ago. His  smoking use included cigarettes. He has a 30.00 pack-year smoking history. He has never used smokeless tobacco. He reports that he does not drink alcohol and does not use drugs.  Allergies:  Allergies  Allergen Reactions   Aspirin Other (See Comments)    Other reaction(s): Other (See Comments) He gets GI bleed.     Nsaids Other (See Comments)    Gi bleed   Ace Inhibitors Other (See Comments)    unknown   Lisinopril Other (See Comments)    unknown   Hydrochlorothiazide Other (See Comments)    Gout   Ramipril Other (See Comments)    unknown    Medications reviewed.    ROS Full ROS performed and is otherwise negative other than what is stated in HPI   BP 124/84    Pulse (!) 105    Temp 97.8 F (36.6 C) (Oral)    Ht 5\' 7"  (1.702 m)    Wt 178 lb 6.4 oz (80.9 kg)    SpO2 95%    BMI 27.94 kg/m   Physical Exam Vitals and nursing note reviewed. Exam conducted with a chaperone present.  Constitutional:      General: He is not in acute distress.    Appearance: Normal appearance. He is normal weight. He is not toxic-appearing.  Eyes:     General: No scleral icterus.       Right eye: No discharge.        Left eye: No discharge.  Cardiovascular:     Rate and Rhythm: Normal rate and regular rhythm.     Heart sounds: No murmur heard. Pulmonary:     Effort: Pulmonary effort is normal. No respiratory distress.     Breath sounds: Normal breath sounds. No stridor. No wheezing or rhonchi.  Abdominal:     General: Abdomen is flat. There is no distension.     Palpations: Abdomen is soft. There is no mass.     Tenderness: There is no abdominal tenderness. There is no guarding or rebound.     Hernia: No hernia is present.     Comments: Robotic scars without any issues  Musculoskeletal:        General: No swelling or tenderness. Normal range of motion.     Cervical back: Normal range of motion and neck supple. No rigidity or tenderness.  Lymphadenopathy:     Cervical: No cervical  adenopathy.  Skin:    General: Skin is warm and dry.     Capillary Refill: Capillary refill takes less than 2 seconds.  Neurological:     General: No focal deficit present.     Mental Status: He is alert and oriented to person, place, and time.  Psychiatric:        Mood and Affect: Mood normal.        Behavior: Behavior normal.        Thought  Content: Thought content normal.        Judgment: Judgment normal.      Assessment/Plan: 75 year old male status post repair of duodenal ulcer robotically.  No evidence of recurrence no evidence of any issues at this time.  May continue PPI and follow with PCP.  Discussed with him in detail about avoiding of NSAIDs that likely was the culprit for his perforation.  note that I spent 13 minutes in this encounter including coordination of his care, personally reviewing the imaging studies, placing orders and performing appropriate documentation  Caroleen Hamman, MD [place] Surgeon
